# Patient Record
Sex: Female | Born: 1955 | Race: Black or African American | Hispanic: No | Smoking: Former smoker
Health system: Southern US, Community
[De-identification: ages and names within clinical notes are randomized; demographics above are authoritative.]

## PROBLEM LIST (undated history)

## (undated) DIAGNOSIS — H409 Unspecified glaucoma: Secondary | ICD-10-CM

## (undated) DIAGNOSIS — I10 Essential (primary) hypertension: Secondary | ICD-10-CM

## (undated) DIAGNOSIS — Z923 Personal history of irradiation: Secondary | ICD-10-CM

## (undated) DIAGNOSIS — C801 Malignant (primary) neoplasm, unspecified: Secondary | ICD-10-CM

## (undated) DIAGNOSIS — C50919 Malignant neoplasm of unspecified site of unspecified female breast: Secondary | ICD-10-CM

## (undated) DIAGNOSIS — Z860100 Personal history of colon polyps, unspecified: Secondary | ICD-10-CM

## (undated) DIAGNOSIS — Z803 Family history of malignant neoplasm of breast: Secondary | ICD-10-CM

## (undated) DIAGNOSIS — Z800 Family history of malignant neoplasm of digestive organs: Secondary | ICD-10-CM

## (undated) DIAGNOSIS — E785 Hyperlipidemia, unspecified: Secondary | ICD-10-CM

## (undated) DIAGNOSIS — D649 Anemia, unspecified: Secondary | ICD-10-CM

## (undated) DIAGNOSIS — H548 Legal blindness, as defined in USA: Secondary | ICD-10-CM

## (undated) HISTORY — PX: BREAST EXCISIONAL BIOPSY: SUR124

## (undated) HISTORY — DX: Family history of malignant neoplasm of breast: Z80.3

## (undated) HISTORY — PX: COLONOSCOPY: SHX174

## (undated) HISTORY — DX: Family history of malignant neoplasm of digestive organs: Z80.0

## (undated) HISTORY — PX: BREAST LUMPECTOMY: SHX2

## (undated) HISTORY — PX: HERNIA REPAIR: SHX51

## (undated) HISTORY — PX: TUBAL LIGATION: SHX77

---
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
## 1999-09-05 ENCOUNTER — Encounter: Admission: RE | Admit: 1999-09-05 | Discharge: 1999-09-13 | Payer: Self-pay | Admitting: Internal Medicine

## 1999-11-13 ENCOUNTER — Ambulatory Visit (HOSPITAL_COMMUNITY): Admission: RE | Admit: 1999-11-13 | Discharge: 1999-11-13 | Payer: Self-pay | Admitting: Internal Medicine

## 1999-11-13 ENCOUNTER — Encounter: Payer: Self-pay | Admitting: Internal Medicine

## 2000-02-27 ENCOUNTER — Emergency Department (HOSPITAL_COMMUNITY): Admission: EM | Admit: 2000-02-27 | Discharge: 2000-02-28 | Payer: Self-pay | Admitting: Emergency Medicine

## 2000-02-27 ENCOUNTER — Encounter: Payer: Self-pay | Admitting: Emergency Medicine

## 2004-07-20 ENCOUNTER — Ambulatory Visit: Payer: Self-pay | Admitting: Family Medicine

## 2004-08-01 ENCOUNTER — Ambulatory Visit: Payer: Self-pay | Admitting: Family Medicine

## 2004-08-03 ENCOUNTER — Ambulatory Visit: Payer: Self-pay | Admitting: Family Medicine

## 2004-08-24 ENCOUNTER — Encounter: Admission: RE | Admit: 2004-08-24 | Discharge: 2004-08-24 | Payer: Self-pay | Admitting: Sports Medicine

## 2005-01-12 ENCOUNTER — Ambulatory Visit: Payer: Self-pay | Admitting: Family Medicine

## 2005-01-26 ENCOUNTER — Ambulatory Visit: Payer: Self-pay | Admitting: Family Medicine

## 2005-04-06 ENCOUNTER — Ambulatory Visit: Payer: Self-pay | Admitting: Family Medicine

## 2005-08-28 ENCOUNTER — Encounter: Admission: RE | Admit: 2005-08-28 | Discharge: 2005-08-28 | Payer: Self-pay | Admitting: Sports Medicine

## 2005-09-19 ENCOUNTER — Ambulatory Visit: Payer: Self-pay | Admitting: Family Medicine

## 2006-09-13 ENCOUNTER — Encounter (INDEPENDENT_AMBULATORY_CARE_PROVIDER_SITE_OTHER): Payer: Self-pay | Admitting: *Deleted

## 2006-09-13 LAB — CONVERTED CEMR LAB

## 2006-09-20 ENCOUNTER — Encounter: Admission: RE | Admit: 2006-09-20 | Discharge: 2006-09-20 | Payer: Self-pay | Admitting: Sports Medicine

## 2006-10-02 ENCOUNTER — Encounter (INDEPENDENT_AMBULATORY_CARE_PROVIDER_SITE_OTHER): Payer: Self-pay | Admitting: Family Medicine

## 2006-10-02 ENCOUNTER — Ambulatory Visit: Payer: Self-pay | Admitting: Family Medicine

## 2006-10-02 LAB — CONVERTED CEMR LAB
AST: 16 units/L (ref 0–37)
Albumin: 4.2 g/dL (ref 3.5–5.2)
BUN: 8 mg/dL (ref 6–23)
Calcium: 9.6 mg/dL (ref 8.4–10.5)
Chloride: 105 meq/L (ref 96–112)
Glucose, Bld: 72 mg/dL (ref 70–99)
HDL: 50 mg/dL (ref >39)
LDL Cholesterol: 113 mg/dL — ABNORMAL HIGH (ref 0–99)
Potassium: 3.8 meq/L (ref 3.5–5.3)
Sodium: 142 meq/L (ref 135–145)
Total Protein: 6.8 g/dL (ref 6.0–8.3)

## 2006-10-03 LAB — CONVERTED CEMR LAB
HDL: 50 mg/dL
LDL Cholesterol: 113 mg/dL
Triglycerides: 71 mg/dL

## 2006-10-10 DIAGNOSIS — K649 Unspecified hemorrhoids: Secondary | ICD-10-CM | POA: Insufficient documentation

## 2006-10-10 DIAGNOSIS — I10 Essential (primary) hypertension: Secondary | ICD-10-CM | POA: Insufficient documentation

## 2006-10-11 ENCOUNTER — Encounter (INDEPENDENT_AMBULATORY_CARE_PROVIDER_SITE_OTHER): Payer: Self-pay | Admitting: *Deleted

## 2007-01-20 ENCOUNTER — Ambulatory Visit: Payer: Self-pay | Admitting: Sports Medicine

## 2007-03-03 ENCOUNTER — Encounter (INDEPENDENT_AMBULATORY_CARE_PROVIDER_SITE_OTHER): Payer: Self-pay | Admitting: Family Medicine

## 2007-03-03 ENCOUNTER — Ambulatory Visit: Payer: Self-pay | Admitting: Family Medicine

## 2007-03-04 ENCOUNTER — Encounter (INDEPENDENT_AMBULATORY_CARE_PROVIDER_SITE_OTHER): Payer: Self-pay | Admitting: Family Medicine

## 2007-03-04 LAB — CONVERTED CEMR LAB
BUN: 15 mg/dL (ref 6–23)
Calcium: 9.6 mg/dL (ref 8.4–10.5)
Chloride: 103 meq/L (ref 96–112)
Creatinine, Ser: 0.85 mg/dL (ref 0.40–1.20)

## 2007-03-07 ENCOUNTER — Ambulatory Visit: Payer: Self-pay | Admitting: Family Medicine

## 2007-03-07 LAB — CONVERTED CEMR LAB: Blood Glucose, AC Bkfst: 119 mg/dL

## 2007-04-11 ENCOUNTER — Encounter (INDEPENDENT_AMBULATORY_CARE_PROVIDER_SITE_OTHER): Payer: Self-pay | Admitting: Family Medicine

## 2007-04-17 ENCOUNTER — Ambulatory Visit: Payer: Self-pay | Admitting: Sports Medicine

## 2007-04-17 ENCOUNTER — Encounter (INDEPENDENT_AMBULATORY_CARE_PROVIDER_SITE_OTHER): Payer: Self-pay | Admitting: Family Medicine

## 2007-04-17 DIAGNOSIS — R011 Cardiac murmur, unspecified: Secondary | ICD-10-CM

## 2007-04-17 LAB — CONVERTED CEMR LAB
BUN: 17 mg/dL
CO2, serum: 29 mmol/L
Calcium: 10 mg/dL
Chloride, Serum: 102 mmol/L
Potassium, serum: 3.3 mmol/L
Sodium, serum: 142 mmol/L

## 2007-04-18 LAB — CONVERTED CEMR LAB
Calcium: 10 mg/dL (ref 8.4–10.5)
Glucose, Bld: 115 mg/dL — ABNORMAL HIGH (ref 70–99)
Potassium: 3.3 meq/L — ABNORMAL LOW (ref 3.5–5.3)
Sodium: 142 meq/L (ref 135–145)

## 2007-04-29 ENCOUNTER — Ambulatory Visit (HOSPITAL_COMMUNITY): Admission: RE | Admit: 2007-04-29 | Discharge: 2007-04-29 | Payer: Self-pay | Admitting: Sports Medicine

## 2007-04-29 ENCOUNTER — Encounter: Payer: Self-pay | Admitting: Sports Medicine

## 2007-05-07 ENCOUNTER — Telehealth: Payer: Self-pay | Admitting: *Deleted

## 2007-05-15 ENCOUNTER — Ambulatory Visit: Payer: Self-pay | Admitting: Family Medicine

## 2007-08-21 ENCOUNTER — Encounter (INDEPENDENT_AMBULATORY_CARE_PROVIDER_SITE_OTHER): Payer: Self-pay | Admitting: Family Medicine

## 2007-09-08 ENCOUNTER — Telehealth (INDEPENDENT_AMBULATORY_CARE_PROVIDER_SITE_OTHER): Payer: Self-pay | Admitting: Family Medicine

## 2007-09-19 ENCOUNTER — Ambulatory Visit: Payer: Self-pay | Admitting: Family Medicine

## 2007-09-30 ENCOUNTER — Telehealth (INDEPENDENT_AMBULATORY_CARE_PROVIDER_SITE_OTHER): Payer: Self-pay | Admitting: Family Medicine

## 2007-10-21 ENCOUNTER — Encounter: Admission: RE | Admit: 2007-10-21 | Discharge: 2007-10-21 | Payer: Self-pay | Admitting: Family Medicine

## 2007-10-22 ENCOUNTER — Encounter (INDEPENDENT_AMBULATORY_CARE_PROVIDER_SITE_OTHER): Payer: Self-pay | Admitting: Family Medicine

## 2007-10-22 ENCOUNTER — Ambulatory Visit: Payer: Self-pay | Admitting: Family Medicine

## 2007-10-22 LAB — CONVERTED CEMR LAB
ALT: 16 units/L (ref 0–35)
BUN: 10 mg/dL (ref 6–23)
CO2: 26 meq/L (ref 19–32)
Calcium: 9.2 mg/dL (ref 8.4–10.5)
Chloride: 105 meq/L (ref 96–112)
Cholesterol: 191 mg/dL (ref 0–200)
Creatinine, Ser: 0.63 mg/dL (ref 0.40–1.20)
Glucose, Bld: 160 mg/dL — ABNORMAL HIGH (ref 70–99)
Total Bilirubin: 0.5 mg/dL (ref 0.3–1.2)
Total CHOL/HDL Ratio: 3.4
Triglycerides: 80 mg/dL (ref <150)
VLDL: 16 mg/dL (ref 0–40)

## 2007-11-11 ENCOUNTER — Encounter (INDEPENDENT_AMBULATORY_CARE_PROVIDER_SITE_OTHER): Payer: Self-pay | Admitting: Family Medicine

## 2007-11-11 ENCOUNTER — Telehealth (INDEPENDENT_AMBULATORY_CARE_PROVIDER_SITE_OTHER): Payer: Self-pay | Admitting: *Deleted

## 2008-02-02 ENCOUNTER — Telehealth (INDEPENDENT_AMBULATORY_CARE_PROVIDER_SITE_OTHER): Payer: Self-pay | Admitting: *Deleted

## 2008-03-30 ENCOUNTER — Telehealth: Payer: Self-pay | Admitting: *Deleted

## 2008-05-10 ENCOUNTER — Encounter: Payer: Self-pay | Admitting: *Deleted

## 2008-05-10 ENCOUNTER — Telehealth: Payer: Self-pay | Admitting: *Deleted

## 2008-05-26 ENCOUNTER — Ambulatory Visit: Payer: Self-pay | Admitting: Family Medicine

## 2008-05-26 ENCOUNTER — Encounter (INDEPENDENT_AMBULATORY_CARE_PROVIDER_SITE_OTHER): Payer: Self-pay | Admitting: Family Medicine

## 2008-05-26 DIAGNOSIS — H4010X3 Unspecified open-angle glaucoma, severe stage: Secondary | ICD-10-CM

## 2008-05-27 LAB — CONVERTED CEMR LAB
BUN: 17 mg/dL (ref 6–23)
CO2: 26 meq/L (ref 19–32)
Calcium: 9.8 mg/dL (ref 8.4–10.5)
Glucose, Bld: 100 mg/dL — ABNORMAL HIGH (ref 70–99)
Potassium: 3.5 meq/L (ref 3.5–5.3)
Sodium: 143 meq/L (ref 135–145)

## 2008-06-23 ENCOUNTER — Ambulatory Visit: Payer: Self-pay | Admitting: Family Medicine

## 2008-06-23 DIAGNOSIS — E119 Type 2 diabetes mellitus without complications: Secondary | ICD-10-CM

## 2008-06-23 DIAGNOSIS — E1165 Type 2 diabetes mellitus with hyperglycemia: Secondary | ICD-10-CM | POA: Insufficient documentation

## 2008-06-30 ENCOUNTER — Ambulatory Visit: Payer: Self-pay | Admitting: Sports Medicine

## 2008-07-01 ENCOUNTER — Telehealth (INDEPENDENT_AMBULATORY_CARE_PROVIDER_SITE_OTHER): Payer: Self-pay | Admitting: *Deleted

## 2008-08-03 ENCOUNTER — Encounter (INDEPENDENT_AMBULATORY_CARE_PROVIDER_SITE_OTHER): Payer: Self-pay | Admitting: Family Medicine

## 2008-08-04 ENCOUNTER — Telehealth (INDEPENDENT_AMBULATORY_CARE_PROVIDER_SITE_OTHER): Payer: Self-pay | Admitting: Family Medicine

## 2008-08-09 ENCOUNTER — Ambulatory Visit: Payer: Self-pay | Admitting: Family Medicine

## 2008-08-09 ENCOUNTER — Encounter (INDEPENDENT_AMBULATORY_CARE_PROVIDER_SITE_OTHER): Payer: Self-pay | Admitting: Family Medicine

## 2008-08-09 LAB — CONVERTED CEMR LAB
BUN: 12 mg/dL (ref 6–23)
CO2: 28 meq/L (ref 19–32)
Glucose, Bld: 145 mg/dL — ABNORMAL HIGH (ref 70–99)
Potassium: 3.8 meq/L (ref 3.5–5.3)
Sodium: 141 meq/L (ref 135–145)

## 2008-08-18 ENCOUNTER — Telehealth (INDEPENDENT_AMBULATORY_CARE_PROVIDER_SITE_OTHER): Payer: Self-pay | Admitting: Family Medicine

## 2008-09-27 ENCOUNTER — Telehealth: Payer: Self-pay | Admitting: *Deleted

## 2008-09-29 ENCOUNTER — Ambulatory Visit: Payer: Self-pay | Admitting: Family Medicine

## 2008-09-29 ENCOUNTER — Encounter (INDEPENDENT_AMBULATORY_CARE_PROVIDER_SITE_OTHER): Payer: Self-pay | Admitting: Family Medicine

## 2008-09-30 ENCOUNTER — Ambulatory Visit: Payer: Self-pay | Admitting: Family Medicine

## 2008-10-04 ENCOUNTER — Encounter (INDEPENDENT_AMBULATORY_CARE_PROVIDER_SITE_OTHER): Payer: Self-pay | Admitting: Family Medicine

## 2008-10-21 ENCOUNTER — Encounter: Admission: RE | Admit: 2008-10-21 | Discharge: 2008-10-21 | Payer: Self-pay | Admitting: Family Medicine

## 2008-10-29 ENCOUNTER — Encounter: Payer: Self-pay | Admitting: *Deleted

## 2008-11-22 ENCOUNTER — Encounter (INDEPENDENT_AMBULATORY_CARE_PROVIDER_SITE_OTHER): Payer: Self-pay | Admitting: Family Medicine

## 2008-11-22 ENCOUNTER — Ambulatory Visit: Payer: Self-pay | Admitting: Family Medicine

## 2008-11-22 DIAGNOSIS — E669 Obesity, unspecified: Secondary | ICD-10-CM

## 2008-11-30 LAB — CONVERTED CEMR LAB
BUN: 13 mg/dL (ref 6–23)
CO2: 24 meq/L (ref 19–32)
Calcium: 9.5 mg/dL (ref 8.4–10.5)
Chloride: 102 meq/L (ref 96–112)
Cholesterol: 195 mg/dL (ref 0–200)
Creatinine, Ser: 0.76 mg/dL (ref 0.40–1.20)
HDL: 56 mg/dL (ref >39)
Total Bilirubin: 0.4 mg/dL (ref 0.3–1.2)
Total CHOL/HDL Ratio: 3.5
Triglycerides: 93 mg/dL (ref <150)

## 2008-12-07 ENCOUNTER — Telehealth (INDEPENDENT_AMBULATORY_CARE_PROVIDER_SITE_OTHER): Payer: Self-pay | Admitting: *Deleted

## 2008-12-21 ENCOUNTER — Encounter: Payer: Self-pay | Admitting: Family Medicine

## 2008-12-21 ENCOUNTER — Ambulatory Visit (HOSPITAL_COMMUNITY): Admission: RE | Admit: 2008-12-21 | Discharge: 2008-12-21 | Payer: Self-pay | Admitting: Gastroenterology

## 2008-12-21 ENCOUNTER — Encounter (INDEPENDENT_AMBULATORY_CARE_PROVIDER_SITE_OTHER): Payer: Self-pay | Admitting: Gastroenterology

## 2008-12-23 ENCOUNTER — Ambulatory Visit: Payer: Self-pay | Admitting: Family Medicine

## 2008-12-24 ENCOUNTER — Telehealth (INDEPENDENT_AMBULATORY_CARE_PROVIDER_SITE_OTHER): Payer: Self-pay | Admitting: *Deleted

## 2008-12-29 ENCOUNTER — Telehealth (INDEPENDENT_AMBULATORY_CARE_PROVIDER_SITE_OTHER): Payer: Self-pay | Admitting: Family Medicine

## 2008-12-30 ENCOUNTER — Encounter (INDEPENDENT_AMBULATORY_CARE_PROVIDER_SITE_OTHER): Payer: Self-pay | Admitting: Family Medicine

## 2008-12-30 ENCOUNTER — Ambulatory Visit: Payer: Self-pay | Admitting: Family Medicine

## 2009-01-04 ENCOUNTER — Telehealth (INDEPENDENT_AMBULATORY_CARE_PROVIDER_SITE_OTHER): Payer: Self-pay | Admitting: Family Medicine

## 2009-01-07 ENCOUNTER — Telehealth: Payer: Self-pay | Admitting: *Deleted

## 2009-01-26 ENCOUNTER — Ambulatory Visit: Payer: Self-pay | Admitting: Family Medicine

## 2009-01-26 ENCOUNTER — Encounter (INDEPENDENT_AMBULATORY_CARE_PROVIDER_SITE_OTHER): Payer: Self-pay | Admitting: Family Medicine

## 2009-01-26 DIAGNOSIS — E785 Hyperlipidemia, unspecified: Secondary | ICD-10-CM

## 2009-01-27 LAB — CONVERTED CEMR LAB
BUN: 17 mg/dL (ref 6–23)
CO2: 27 meq/L (ref 19–32)
Creatinine, Ser: 0.69 mg/dL (ref 0.40–1.20)
Direct LDL: 91 mg/dL
Glucose, Bld: 86 mg/dL (ref 70–99)
Total Bilirubin: 0.3 mg/dL (ref 0.3–1.2)
Total Protein: 6.5 g/dL (ref 6.0–8.3)

## 2009-04-26 ENCOUNTER — Telehealth: Payer: Self-pay | Admitting: Family Medicine

## 2009-05-02 ENCOUNTER — Ambulatory Visit: Payer: Self-pay | Admitting: Family Medicine

## 2009-05-02 LAB — CONVERTED CEMR LAB: Hgb A1c MFr Bld: 7 %

## 2009-09-07 ENCOUNTER — Ambulatory Visit: Payer: Self-pay | Admitting: Family Medicine

## 2009-09-14 ENCOUNTER — Telehealth: Payer: Self-pay | Admitting: Family Medicine

## 2009-09-27 ENCOUNTER — Ambulatory Visit: Payer: Self-pay | Admitting: Family Medicine

## 2009-09-27 ENCOUNTER — Encounter: Payer: Self-pay | Admitting: Family Medicine

## 2009-09-27 LAB — CONVERTED CEMR LAB
Hgb A1c MFr Bld: 7.3 %
Whiff Test: POSITIVE

## 2009-10-03 LAB — CONVERTED CEMR LAB
Albumin: 4.4 g/dL (ref 3.5–5.2)
Alkaline Phosphatase: 61 units/L (ref 39–117)
BUN: 14 mg/dL (ref 6–23)
Creatinine, Ser: 0.73 mg/dL (ref 0.40–1.20)
Glucose, Bld: 93 mg/dL (ref 70–99)
Potassium: 3.9 meq/L (ref 3.5–5.3)

## 2009-10-25 ENCOUNTER — Ambulatory Visit (HOSPITAL_COMMUNITY): Admission: RE | Admit: 2009-10-25 | Discharge: 2009-10-25 | Payer: Self-pay | Admitting: Family Medicine

## 2009-10-25 ENCOUNTER — Encounter: Payer: Self-pay | Admitting: Family Medicine

## 2009-10-25 ENCOUNTER — Ambulatory Visit: Payer: Self-pay | Admitting: Family Medicine

## 2009-10-25 DIAGNOSIS — R55 Syncope and collapse: Secondary | ICD-10-CM

## 2009-10-26 ENCOUNTER — Encounter: Admission: RE | Admit: 2009-10-26 | Discharge: 2009-10-26 | Payer: Self-pay | Admitting: Family Medicine

## 2009-10-26 LAB — CONVERTED CEMR LAB
Hemoglobin: 13.5 g/dL (ref 12.0–15.0)
Lymphocytes Relative: 47 % — ABNORMAL HIGH (ref 12–46)
MCHC: 32.3 g/dL (ref 30.0–36.0)
Monocytes Absolute: 0.5 10*3/uL (ref 0.1–1.0)
Monocytes Relative: 8 % (ref 3–12)
Neutro Abs: 2.4 10*3/uL (ref 1.7–7.7)
RBC: 4.91 M/uL (ref 3.87–5.11)
TSH: 0.867 microintl units/mL (ref 0.350–4.500)

## 2009-10-31 ENCOUNTER — Encounter: Payer: Self-pay | Admitting: Family Medicine

## 2009-12-23 ENCOUNTER — Telehealth: Payer: Self-pay | Admitting: Family Medicine

## 2010-01-11 ENCOUNTER — Ambulatory Visit: Payer: Self-pay | Admitting: Family Medicine

## 2010-01-11 LAB — CONVERTED CEMR LAB: Hgb A1c MFr Bld: 7.6 %

## 2010-03-28 ENCOUNTER — Encounter: Payer: Self-pay | Admitting: Family Medicine

## 2010-03-28 ENCOUNTER — Ambulatory Visit: Payer: Self-pay | Admitting: Family Medicine

## 2010-03-28 DIAGNOSIS — H109 Unspecified conjunctivitis: Secondary | ICD-10-CM | POA: Insufficient documentation

## 2010-04-11 ENCOUNTER — Telehealth: Payer: Self-pay | Admitting: Family Medicine

## 2010-05-08 ENCOUNTER — Telehealth: Payer: Self-pay | Admitting: Family Medicine

## 2010-05-17 ENCOUNTER — Ambulatory Visit: Payer: Self-pay | Admitting: Family Medicine

## 2010-06-06 ENCOUNTER — Telehealth: Payer: Self-pay | Admitting: Family Medicine

## 2010-06-07 ENCOUNTER — Telehealth (INDEPENDENT_AMBULATORY_CARE_PROVIDER_SITE_OTHER): Payer: Self-pay | Admitting: *Deleted

## 2010-06-07 ENCOUNTER — Encounter: Payer: Self-pay | Admitting: Family Medicine

## 2010-06-28 ENCOUNTER — Ambulatory Visit: Payer: Self-pay | Admitting: Family Medicine

## 2010-06-28 DIAGNOSIS — Z8669 Personal history of other diseases of the nervous system and sense organs: Secondary | ICD-10-CM | POA: Insufficient documentation

## 2010-07-12 ENCOUNTER — Encounter: Payer: Self-pay | Admitting: *Deleted

## 2010-09-12 NOTE — Assessment & Plan Note (Signed)
Summary: new syncopal episode   Vital Signs:  Patient profile:   55 year old female Height:      63 inches Weight:      169.1 pounds BMI:     30.06 Temp:     97.8 degrees F oral Pulse rate:   60 / minute BP sitting:   142 / 89  (left arm) Cuff size:   regular  Vitals Entered By: Gladstone Pih (October 25, 2009 10:31 AM)   Serial Vital Signs/Assessments:  Time      Position  BP       Pulse  Resp  Temp     By 10:35 AM            138/88                         Gladstone Pih  Comments: 10:35 AM re checked manually By: Gladstone Pih  11:04 AM ortho static laying down 128/82 pulse 68 sitting up 132/92  Pulse 64 standing up  136/94  pulse 60 By: Gladstone Pih   CC: F/U HTN, new med from last visit Is Patient Diabetic? Yes Did you bring your meter with you today? No Pain Assessment Patient in pain? no        Primary Care Provider:  Marisue Ivan  MD  CC:  F/U HTN and new med from last visit.  History of Present Illness: 55yo F here for f/u HTN and new syncopal event  HTN: No adverse effects from medication.  Not checking it regularly.  Was not well controlled at last visit.  No dizziness, HA, CP, palpitations, or swelling.  Syncopal event: 2 episodes in the past 2 weeks.  Occurred while she was placing her grandchild in a car seat.  States that she passed out for a few seconds.  No head trauma.  No hx of shaking or incontinence.  Denies any chest pain or palpitations associated.     Habits & Providers  Alcohol-Tobacco-Diet     Tobacco Status: never  Current Medications (verified): 1)  Hydrochlorothiazide 25 Mg Tabs (Hydrochlorothiazide) .... Take 1 Tablet By Mouth Once A Day 2)  Lisinopril 20 Mg Tabs (Lisinopril) .Marland Kitchen.. 1 Tablet By Mouth Daily For Bp 3)  Metoprolol Tartrate 50 Mg Tabs (Metoprolol Tartrate) .... Take 1 Tablet By Mouth Two Times A Day 4)  Tgt Aspirin 81 Mg Tbec (Aspirin) .... Take 1 Tablet By Mouth Once A Day 5)  Womens Multivitamin Plus  Tabs  (Multiple Vitamins-Minerals) 6)  Metformin Hcl 500 Mg Tabs (Metformin Hcl) .Marland Kitchen.. 1 Tablet By Mouth Two Times A Day With Food 7)  Test Strips, Lancets .... Use To Check Your Blood Sugar Every Morning Before Breakfast - Disp Qs For 90 Day Supply 8)  Simvastatin 40 Mg Tabs (Simvastatin) .Marland Kitchen.. 1 Tablet By Mouth Daily For Cholesterol 9)  Amlodipine Besylate 10 Mg Tabs (Amlodipine Besylate) .Marland Kitchen.. 1 Tab By Mouth Daily  Allergies (verified): 1)  Sulfamethoxazole (Sulfamethoxazole)  Review of Systems       Denies any chest pain or palpitations associated.   No dizziness, HA, CP, palpitations, or swelling.   Physical Exam  General:  VS Reviewed. Well appearing, NAD.  Neck:  supple, full ROM, no goiter or mass  Lungs:  Normal respiratory effort, chest expands symmetrically. Lungs are clear to auscultation, no crackles or wheezes. Heart:  Normal rate and regular rhythm. S1 and S2 normal without gallop, murmur, click, rub or other extra sounds.  Extremities:  no edema Neurologic:  no focal deficits Additional Exam:  12 lead EKG: NSR, rate 51, no ST or T wave changes   Impression & Recommendations:  Problem # 1:  SYNCOPE (ICD-780.2) Assessment New New onset- isolated episodes. EKG conveys bradycardia without arrhythmias.   Orthostatics negative. TSH and CBC- pending Suspect the bradycardia may be a likely cause.  Will decrease the metoprolol to 1 tab two times a day. Reassess in 1 month.    Orders: 12 Lead EKG (12 Lead EKG) CBC w/Diff-[facility] ([address]109) TSH-[facility] (60454-09811) [facility]- Est  Level 4 (91478)  Problem # 2:  HYPERTENSION, BENIGN SYSTEMIC (ICD-401.1) Assessment: Improved At goal (<140/90). However, given the syncopal episodes that could possibly be due to the bradycardia, plan to dec the metoprolol to 1 tab two times a day and inc the amlodipine to 10mg .    Her updated medication list for this problem includes:    Hydrochlorothiazide 25 Mg Tabs (Hydrochlorothiazide) .Marland Kitchen... Take 1  tablet by mouth once a day    Lisinopril 20 Mg Tabs (Lisinopril) .Marland Kitchen... 1 tablet by mouth daily for bp    Metoprolol Tartrate 50 Mg Tabs (Metoprolol tartrate) .Marland Kitchen... Take 1 tablet by mouth two times a day    Amlodipine Besylate 10 Mg Tabs (Amlodipine besylate) .Marland Kitchen... 1 tab by mouth daily  Orders: [facility]- Est  Level 4 (29562)  Complete Medication List: 1)  Hydrochlorothiazide 25 Mg Tabs (Hydrochlorothiazide) .... Take 1 tablet by mouth once a day 2)  Lisinopril 20 Mg Tabs (Lisinopril) .Marland Kitchen.. 1 tablet by mouth daily for bp 3)  Metoprolol Tartrate 50 Mg Tabs (Metoprolol tartrate) .... Take 1 tablet by mouth two times a day 4)  Tgt Aspirin 81 Mg Tbec (Aspirin) .... Take 1 tablet by mouth once a day 5)  Womens Multivitamin Plus Tabs (Multiple vitamins-minerals) 6)  Metformin Hcl 500 Mg Tabs (Metformin hcl) .Marland Kitchen.. 1 tablet by mouth two times a day with food 7)  Test Strips, Lancets  .... Use to check your blood sugar every morning before breakfast - disp qs for 90 day supply 8)  Simvastatin 40 Mg Tabs (Simvastatin) .Marland Kitchen.. 1 tablet by mouth daily for cholesterol 9)  Amlodipine Besylate 10 Mg Tabs (Amlodipine besylate) .Marland Kitchen.. 1 tab by mouth daily  Patient Instructions: 1)  Check your pulse and bp this week and let me know the readings. 2)  We decreased the metoprolol to 1 tablet two times a day  3)  We increased the amlodipine to 10mg  daily Prescriptions: AMLODIPINE BESYLATE 10 MG TABS (AMLODIPINE BESYLATE) 1 tab by mouth daily  #90 x 1   Entered and Authorized by:   Marisue Ivan  MD   Signed by:   Marisue Ivan  MD on 10/25/2009   Method used:   Faxed to ...       [facility] Department (retail)       [address] [place], [place]  [address]       Ph: [phone]       Fax: [phone]   RxID:   [id number]      Prevention & Chronic Care Immunizations   Influenza vaccine: Fluvax 3+  (09/07/2009)   Influenza vaccine deferral: Not available  (05/02/2009)    Influenza vaccine due: 05/26/2009    Tetanus booster: 01/16/2005: Done.   Tetanus booster due: 01/17/2015    Pneumococcal vaccine: Not documented  Colorectal Screening   Hemoccult: Not documented   Hemoccult due: Not Indicated  Colonoscopy: abnormal  (12/21/2008)   Colonoscopy due: 12/22/2011  Other Screening   Pap smear: NEGATIVE FOR INTRAEPITHELIAL LESIONS OR MALIGNANCY.  (09/29/2008)   Pap smear due: 09/30/2011    Mammogram: ASSESSMENT: Negative - BI-RADS 1^MM DIGITAL SCREENING  (10/21/2008)   Mammogram action/deferral: Ordered  (09/27/2009)   Mammogram due: 10/21/2009   Smoking status: never  (10/25/2009)  Diabetes Mellitus   HgbA1C: 7.3  (09/27/2009)   HgbA1C action/deferral: Ordered  (05/02/2009)   Hemoglobin A1C due: 12/27/2008    Eye exam: normal  (10/08/2008)   Eye exam due: 10/08/2009    Foot exam: yes  (01/26/2009)   High risk foot: Not documented   Foot care education: Not documented   Foot exam due: 06/23/2009    Urine microalbumin/creatinine ratio: Not documented   Urine microalbumin action/deferral: Not indicated   Urine microalbumin/cr due: 08/09/2009  Lipids   Total Cholesterol: 195  (11/22/2008)   LDL: 120  (11/22/2008)   LDL Direct: 91  (01/26/2009)   HDL: 56  (11/22/2008)   Triglycerides: 93  (11/22/2008)    SGOT (AST): 15  (09/27/2009)   BMP action: Ordered   SGPT (ALT): 15  (09/27/2009)   Alkaline phosphatase: 61  (09/27/2009)   Total bilirubin: 0.4  (09/27/2009)  Hypertension   Last Blood Pressure: 142 / 89  (10/25/2009)   Serum creatinine: 0.73  (09/27/2009)   BMP action: Ordered   Serum potassium 3.9  (09/27/2009)    Hypertension flowsheet reviewed?: Yes   Progress toward BP goal: At goal  Self-Management Support :   Personal Goals (by the next clinic visit) :     Personal A1C goal: 8  (09/27/2009)     Personal blood pressure goal: 140/90  (05/02/2009)     Personal LDL goal: 100  (05/02/2009)    Diabetes self-management  support: CBG self-monitoring log, Written self-care plan, Education handout  (09/27/2009)    Hypertension self-management support: Written self-care plan  (09/27/2009)    Lipid self-management support: Written self-care plan  (09/27/2009)

## 2010-09-12 NOTE — Assessment & Plan Note (Signed)
Summary: f/u chronic issues and recent syncopal events   Vital Signs:  Patient profile:   55 year old female Height:      63 inches Weight:      167.0 pounds BMI:     29.69 Temp:     97.5 degrees F oral Pulse rate:   64 / minute BP sitting:   128 / 80  (left arm) Cuff size:   regular  Vitals Entered By: Gladstone Pih (January 11, 2010 2:39 PM) CC: F/U for syncopal events Is Patient Diabetic? Yes Did you bring your meter with you today? No Pain Assessment Patient in pain? no        Primary Care Provider:  Marisue Ivan  MD  CC:  F/U for syncopal events.  History of Present Illness: 55yo F here to f/u for syncopal events  Syncopal events: No further episodes since decreasing the metoprolol.  HTN: No adverse effects from medication.  Not checking it regularly.  Was mildly elevated at last visit.  No dizziness, HA, CP, palpitations, or swelling.  DM:  Currently on Metformin.  No adverse effects.  No hypoglycemic events.  No paresthesia or peripheral nerve pain.  CBGs checked 1 time a day and typically run b/w 120-170s with occasional readings up to 200.  Due for diabetic eye exam.  HLD: Tolerating medication.  No adverse effects.  No muscle pain or abd pain.    Habits & Providers  Alcohol-Tobacco-Diet     Tobacco Status: quit     Tobacco Counseling: to quit use of tobacco products     Year Quit: 2000  Current Medications (verified): 1)  Lisinopril-Hydrochlorothiazide 20-25 Mg Tabs (Lisinopril-Hydrochlorothiazide) .Marland Kitchen.. 1 Tab By Mouth Daily 2)  Metoprolol Tartrate 50 Mg Tabs (Metoprolol Tartrate) .... Take 1 Tablet By Mouth Two Times A Day 3)  Tgt Aspirin 81 Mg Tbec (Aspirin) .... Take 1 Tablet By Mouth Once A Day 4)  Womens Multivitamin Plus  Tabs (Multiple Vitamins-Minerals) 5)  Metformin Hcl 1000 Mg Tabs (Metformin Hcl) .... 1/2 Tab in The Morning and 1 Tab in The Evening 6)  Test Strips, Lancets .... Use To Check Your Blood Sugar Every Morning Before Breakfast -  Disp Qs For 90 Day Supply 7)  Simvastatin 40 Mg Tabs (Simvastatin) .Marland Kitchen.. 1 Tablet By Mouth Daily For Cholesterol 8)  Amlodipine Besylate 10 Mg Tabs (Amlodipine Besylate) .Marland Kitchen.. 1 Tab By Mouth Daily  Allergies (verified): 1)  Sulfamethoxazole (Sulfamethoxazole)  Social History: Smoking Status:  quit  Review of Systems      See HPI  Physical Exam  General:  VS Reviewed. Well appearing, NAD.  Lungs:  Normal respiratory effort, chest expands symmetrically. Lungs are clear to auscultation, no crackles or wheezes. Heart:  Normal rate and regular rhythm. S1 and S2 normal without gallop, murmur, click, rub or other extra sounds. Abdomen:  Soft, NT, ND, no HSM, active BS  Extremities:  no edema Neurologic:  no focal deficits   Impression & Recommendations:  Problem # 1:  SYNCOPE (ICD-780.2) Assessment Improved  No further symptoms. Continue on current regimen of metoprolol.  Orders: [facility]- Est  Level 4 (81191)  Problem # 2:  HYPERTENSION, BENIGN SYSTEMIC (ICD-401.1) Assessment: Unchanged  At goal (<140/90). No changes to regimen.   The following medications were removed from the medication list:    Lisinopril 20 Mg Tabs (Lisinopril) .Marland Kitchen... 1 tablet by mouth daily for bp Her updated medication list for this problem includes:    Lisinopril-hydrochlorothiazide 20-25 Mg Tabs (Lisinopril-hydrochlorothiazide) .Marland KitchenMarland KitchenMarland KitchenMarland Kitchen  1 tab by mouth daily    Metoprolol Tartrate 50 Mg Tabs (Metoprolol tartrate) .Marland Kitchen... Take 1 tablet by mouth two times a day    Amlodipine Besylate 10 Mg Tabs (Amlodipine besylate) .Marland Kitchen... 1 tab by mouth daily  Orders: [facility]- Est  Level 4 (16109)  Problem # 3:  DIABETES MELLITUS, TYPE II (ICD-250.00) Assessment: Deteriorated A1c inc to 7.6%. will inc the metformin to 500mg  in the morning and 1000mg  at night. She is to f/u in 1 month to reassess tolerance of medication.  The following medications were removed from the medication list:    Lisinopril 20 Mg Tabs (Lisinopril)  .Marland Kitchen... 1 tablet by mouth daily for bp Her updated medication list for this problem includes:    Lisinopril-hydrochlorothiazide 20-25 Mg Tabs (Lisinopril-hydrochlorothiazide) .Marland Kitchen... 1 tab by mouth daily    Tgt Aspirin 81 Mg Tbec (Aspirin) .Marland Kitchen... Take 1 tablet by mouth once a day    Metformin Hcl 1000 Mg Tabs (Metformin hcl) .Marland Kitchen... 1/2 tab in the morning and 1 tab in the evening  Orders: A1C-[facility] (60454) [facility]- Est  Level 4 (09811)  Problem # 4:  HYPERLIPIDEMIA (ICD-272.4) Assessment: Improved  At goal (<100) No changes to regimen.  Her updated medication list for this problem includes:    Simvastatin 40 Mg Tabs (Simvastatin) .Marland Kitchen... 1 tablet by mouth daily for cholesterol  Orders: [facility]- Est  Level 4 (99214)  Complete Medication List: 1)  Lisinopril-hydrochlorothiazide 20-25 Mg Tabs (Lisinopril-hydrochlorothiazide) .Marland Kitchen.. 1 tab by mouth daily 2)  Metoprolol Tartrate 50 Mg Tabs (Metoprolol tartrate) .... Take 1 tablet by mouth two times a day 3)  Tgt Aspirin 81 Mg Tbec (Aspirin) .... Take 1 tablet by mouth once a day 4)  Womens Multivitamin Plus Tabs (Multiple vitamins-minerals) 5)  Metformin Hcl 1000 Mg Tabs (Metformin hcl) .... 1/2 tab in the morning and 1 tab in the evening 6)  Test Strips, Lancets  .... Use to check your blood sugar every morning before breakfast - disp qs for 90 day supply 7)  Simvastatin 40 Mg Tabs (Simvastatin) .Marland Kitchen.. 1 tablet by mouth daily for cholesterol 8)  Amlodipine Besylate 10 Mg Tabs (Amlodipine besylate) .Marland Kitchen.. 1 tab by mouth daily  Patient Instructions: 1)  Please schedule a follow-up appointment in 1 month to reassess how you are doing with the increase in metformin to 1000mg  in the morning and 500mg  at night. 2)  I sent some of your meds to [facility]. Prescriptions: METFORMIN HCL 1000 MG TABS (METFORMIN HCL) 1/2 tab in the morning and 1 tab in the evening  #180 x 1   Entered and Authorized by:   Marisue Ivan  MD   Signed by:   Marisue Ivan  MD on  01/11/2010   Method used:   Electronically to        [facility] [phone]* (retail)       [address]       [place], [place]  [address]       Ph: [phone]       Fax: [phone]   RxID:   [id number] SIMVASTATIN 40 MG TABS (SIMVASTATIN) 1 tablet by mouth daily for cholesterol  #30 x 5   Entered and Authorized by:   Marisue Ivan  MD   Signed by:   Marisue Ivan  MD on 01/11/2010   Method used:   Faxed to ...       [facility] Department (retail)       [address] [place]  [place], [place]  [address]       Ph: [phone]       Fax: [phone]   RxID:   [id number] METOPROLOL TARTRATE 50 MG TABS (METOPROLOL TARTRATE) Take 1 tablet by mouth two times a day  #180 x 1   Entered and Authorized by:   Marisue Ivan  MD   Signed by:   Marisue Ivan  MD on 01/11/2010   Method used:   Electronically to        [facility] [phone]* (retail)       [address]       [place], [place]  [address]       Ph: [phone]       Fax: [phone]   RxID:   [id number] LISINOPRIL-HYDROCHLOROTHIAZIDE 20-25 MG TABS (LISINOPRIL-HYDROCHLOROTHIAZIDE) 1 tab by mouth daily  #90 x 1   Entered and Authorized by:   Marisue Ivan  MD   Signed by:   Marisue Ivan  MD on 01/11/2010   Method used:   Electronically to        [facility] [phone]* (retail)       [address]       [place], [place]  [address]       Ph: [phone]       Fax: [phone]   RxID:   [id number]    Laboratory Results   Blood Tests   Date/Time Received: January 11, 2010 2:44 PM  Date/Time Reported: January 11, 2010 3:03 PM   HGBA1C: 7.6%   (Normal Range: Non-Diabetic - 3-6%   Control Diabetic - 6-8%)  Comments: ...........test performed by...........Marland KitchenTerese Door, CMA      Prevention & Chronic Care Immunizations   Influenza vaccine: Fluvax 3+  (09/07/2009)   Influenza vaccine deferral: Not available  (05/02/2009)   Influenza vaccine due:  05/26/2009    Tetanus booster: 01/16/2005: Done.   Tetanus booster due: 01/17/2015    Pneumococcal vaccine: Not documented  Colorectal Screening   Hemoccult: Not documented   Hemoccult due: Not Indicated    Colonoscopy: abnormal  (12/21/2008)   Colonoscopy due: 12/22/2011  Other Screening   Pap smear: NEGATIVE FOR INTRAEPITHELIAL LESIONS OR MALIGNANCY.  (09/29/2008)   Pap smear due: 09/30/2011    Mammogram: ASSESSMENT: Negative - BI-RADS 1^MM DIGITAL SCREENING  (10/26/2009)   Mammogram action/deferral: Ordered  (09/27/2009)   Mammogram due: 10/21/2009   Smoking status: quit  (01/11/2010)  Diabetes Mellitus   HgbA1C: 7.6  (01/11/2010)   HgbA1C action/deferral: Ordered  (05/02/2009)   Hemoglobin A1C due: 12/27/2008    Eye exam: normal  (10/08/2008)   Eye exam due: 10/08/2009    Foot exam: yes  (01/26/2009)   Foot exam action/deferral: Do today   High risk foot: Not documented   Foot care education: Not documented   Foot exam due: 06/23/2009    Urine microalbumin/creatinine ratio: Not documented   Urine microalbumin action/deferral: Not indicated   Urine microalbumin/cr due: 08/09/2009    Diabetes flowsheet reviewed?: Yes   Progress toward A1C goal: Deteriorated  Lipids   Total Cholesterol: 195  (11/22/2008)   LDL: 120  (11/22/2008)   LDL Direct: 91  (01/26/2009)   HDL: 56  (11/22/2008)   Triglycerides: 93  (11/22/2008)    SGOT (AST): 15  (09/27/2009)   BMP action: Ordered   SGPT (ALT): 15  (09/27/2009)   Alkaline phosphatase: 61  (09/27/2009)   Total bilirubin: 0.4  (09/27/2009)    Lipid flowsheet reviewed?: Yes  Progress toward LDL goal: At goal  Hypertension   Last Blood Pressure: 128 / 80  (01/11/2010)   Serum creatinine: 0.73  (09/27/2009)   BMP action: Ordered   Serum potassium 3.9  (09/27/2009)    Hypertension flowsheet reviewed?: Yes   Progress toward BP goal: At goal  Self-Management Support :   Personal Goals (by the next clinic visit)  :     Personal A1C goal: 8  (09/27/2009)     Personal blood pressure goal: 140/90  (05/02/2009)     Personal LDL goal: 100  (05/02/2009)    Patient will work on the following items until the next clinic visit to reach self-care goals:     Medications and monitoring: take my medicines every day, check my blood sugar, check my blood pressure, bring all of my medications to every visit  (01/11/2010)     Eating: drink diet soda or water instead of juice or soda, eat more vegetables, use fresh or frozen vegetables, eat foods that are low in salt, eat baked foods instead of fried foods, eat fruit for snacks and desserts, limit or avoid alcohol  (01/11/2010)     Activity: take a 30 minute walk every day  (05/02/2009)    Diabetes self-management support: Written self-care plan, Education handout  (01/11/2010)   Diabetes care plan printed   Diabetes education handout printed    Hypertension self-management support: Written self-care plan, Education handout  (01/11/2010)   Hypertension self-care plan printed.   Hypertension education handout printed    Lipid self-management support: Written self-care plan, Education handout  (01/11/2010)   Lipid self-care plan printed.   Lipid education handout printed   Nursing Instructions: Diabetic foot exam today     Diabetic Foot Exam Foot Inspection Is there a history of a foot ulcer?              No Is there a foot ulcer now?              No Can the patient see the bottom of their feet?          Yes Are the shoes appropriate in style and fit?          Yes Is there swelling or an abnormal foot shape?          No Are the toenails long?                No Are the toenails thick?                No Are the toenails ingrown?              No Is there heavy callous build-up?              No Is there pain in the calf muscle (Intermittent claudication) when walking?    NoIs there a claw toe deformity?              No Is there elevated skin temperature?             No Is there limited ankle dorsiflexion?            No Is there foot or ankle muscle weakness?            No  Diabetic Foot Care Education    10-g (5.07) Semmes-Weinstein Monofilament Test           Right Foot          Left Foot  Visual Inspection     normal           normal Test Control      normal         normal Site 1         normal         normal Site 2         normal         normal Site 3         normal         normal Site 4         normal         normal Site 5         normal         normal Site 6         normal         normal

## 2010-09-12 NOTE — Progress Notes (Signed)
Summary: refill  Phone Note Refill Request Call back at [phone] Message from:  Patient  Refills Requested: Medication #1:  METOPROLOL TARTRATE 50 MG TABS Take 1 tablet by mouth in the morning and 2 tablets at night Initial call taken by: De Nurse,  September 14, 2009 2:07 PM  Follow-up for Phone Call        to pcp Follow-up by: Golden Circle RN,  September 14, 2009 2:11 PM  Additional Follow-up for Phone Call Additional follow up Details #1::        Refill provided on 1/26 to [facility] pharm. Additional Follow-up by: Marisue Ivan  MD,  September 14, 2009 5:50 PM

## 2010-09-12 NOTE — Assessment & Plan Note (Signed)
Summary: HTN - uncontrolled; reschedule wellness visit   Vital Signs:  Patient profile:   55 year old female Height:      63 inches Weight:      171.2 pounds BMI:     30.44 Temp:     98.4 degrees F oral Pulse rate:   65 / minute BP sitting:   161 / 97  (right arm) Cuff size:   regular  Vitals Entered By: Gladstone Pih (September 07, 2009 10:55 AM)  Serial Vital Signs/Assessments:  Time      Position  BP       Pulse  Resp  Temp     By 10:59 AM            190/104                        Gladstone Pih                     166/98                         Marisue Ivan  MD  Comments: 10:59 AM re checked manually left arm  By: Gladstone Pih  L arm, sitting, manual By: Marisue Ivan  MD   CC: none Is Patient Diabetic? Yes Did you bring your meter with you today? No Pain Assessment Patient in pain? no        Primary Care Provider:  Marisue Ivan  MD  CC:  none.  History of Present Illness: 55yo F that initially presented for a wellness visit but was 1 hour late and found to have acutely elevated BP.  HTN: No adverse effects from medication.  Not checking it regularly.  Was not well controlled at last visit. Failed to follow up accordingly.  States that she is taking her medications as prescribed but I find that hard to believe given the time frame since her last refill.   No dizziness, HA, CP, palpitations, or swelling.   Current Medications (verified): 1)  Hydrochlorothiazide 25 Mg Tabs (Hydrochlorothiazide) .... Take 1 Tablet By Mouth Once A Day 2)  Lisinopril 20 Mg Tabs (Lisinopril) .Marland Kitchen.. 1 Tablet By Mouth Daily For Bp 3)  Metoprolol Tartrate 50 Mg Tabs (Metoprolol Tartrate) .... Take 1 Tablet By Mouth in The Morning and 2 Tablets At Night 4)  Tgt Aspirin 81 Mg Tbec (Aspirin) .... Take 1 Tablet By Mouth Once A Day 5)  Womens Multivitamin Plus  Tabs (Multiple Vitamins-Minerals) 6)  Metformin Hcl 500 Mg Tabs (Metformin Hcl) .Marland Kitchen.. 1 Tablet By Mouth Two Times A Day  With Food 7)  Test Strips, Lancets .... Use To Check Your Blood Sugar Every Morning Before Breakfast - Disp Qs For 90 Day Supply 8)  Simvastatin 40 Mg Tabs (Simvastatin) .Marland Kitchen.. 1 Tablet By Mouth Daily For Cholesterol  Allergies (verified): 1)  Sulfamethoxazole (Sulfamethoxazole)  Review of Systems       No dizziness, HA, CP, palpitations, or swelling.  Physical Exam  General:  VS Reviewed. Well appearing, NAD.  Head:  normocephalic.   Eyes:  EOMI PERRLA Mouth:  Oral mucosa and oropharynx without lesions or exudates.  Teeth in good repair. Neck:  supple, full ROM, no goiter or mass  Lungs:  Normal respiratory effort, chest expands symmetrically. Lungs are clear to auscultation, no crackles or wheezes. Heart:  Normal rate and regular rhythm. S1 and S2 normal without gallop, murmur, click, rub or  other extra sounds. Abdomen:  Soft, NT, ND, no HSM, active BS  Extremities:  no edema Neurologic:  no focal deficits Skin:  nl color and turgor   Impression & Recommendations:  Problem # 1:  HYPERTENSION, BENIGN SYSTEMIC (ICD-401.1) Assessment Deteriorated Not at goal.  Suspect that she is not being compliant with meds.  Plan to refill all meds but inc the metoprolol to 1 tab in the morning and 2 tabs in the evening.  She is to record her BP daily for the next 2 weeks and then contact me to let me know how it is reading.  She is asymptomatic and we had a long discussion regarding how important it is for her to have controlled bp.    Her updated medication list for this problem includes:    Hydrochlorothiazide 25 Mg Tabs (Hydrochlorothiazide) .Marland Kitchen... Take 1 tablet by mouth once a day    Lisinopril 20 Mg Tabs (Lisinopril) .Marland Kitchen... 1 tablet by mouth daily for bp    Metoprolol Tartrate 50 Mg Tabs (Metoprolol tartrate) .Marland Kitchen... Take 1 tablet by mouth in the morning and 2 tablets at night  Orders: [facility]- Est Level  3 (60454)  Problem # 2:  Preventive Health Care (ICD-V70.0) Assessment: Comment  Only Because the patient was over an hour late for her wellness visit, we will have to reschedule her appt.  Complete Medication List: 1)  Hydrochlorothiazide 25 Mg Tabs (Hydrochlorothiazide) .... Take 1 tablet by mouth once a day 2)  Lisinopril 20 Mg Tabs (Lisinopril) .Marland Kitchen.. 1 tablet by mouth daily for bp 3)  Metoprolol Tartrate 50 Mg Tabs (Metoprolol tartrate) .... Take 1 tablet by mouth in the morning and 2 tablets at night 4)  Tgt Aspirin 81 Mg Tbec (Aspirin) .... Take 1 tablet by mouth once a day 5)  Womens Multivitamin Plus Tabs (Multiple vitamins-minerals) 6)  Metformin Hcl 500 Mg Tabs (Metformin hcl) .Marland Kitchen.. 1 tablet by mouth two times a day with food 7)  Test Strips, Lancets  .... Use to check your blood sugar every morning before breakfast - disp qs for 90 day supply 8)  Simvastatin 40 Mg Tabs (Simvastatin) .Marland Kitchen.. 1 tablet by mouth daily for cholesterol  Other Orders: Flu Vaccine 39yrs + (09811) Admin 1st Vaccine (91478)  Patient Instructions: 1)  Call me in 2 weeks to discuss your blood pressure. 2)  Keep a log of your blood pressure everyday for the next 2 weeks. Prescriptions: SIMVASTATIN 40 MG TABS (SIMVASTATIN) 1 tablet by mouth daily for cholesterol  #90 x 0   Entered and Authorized by:   Marisue Ivan  MD   Signed by:   Marisue Ivan  MD on 09/07/2009   Method used:   Faxed to ...       [facility] Department (retail)       [address] [place], [place]  [address]       Ph: [phone]       Fax: [phone]   RxID:   [id number] METFORMIN HCL 500 MG TABS (METFORMIN HCL) 1 tablet by mouth two times a day with food  #180 x 0   Entered and Authorized by:   Marisue Ivan  MD   Signed by:   Marisue Ivan  MD on 09/07/2009   Method used:   Faxed to ...       [facility] Department (retail)       [address] [place]  [place], [place]  [address]       Ph: [phone]       Fax: [phone]   RxID:    [id number] METOPROLOL TARTRATE 50 MG TABS (METOPROLOL TARTRATE) Take 1 tablet by mouth in the morning and 2 tablets at night  #270 x 0   Entered and Authorized by:   Marisue Ivan  MD   Signed by:   Marisue Ivan  MD on 09/07/2009   Method used:   Faxed to ...       [facility] Department (retail)       [address] [place], [place]  [address]       Ph: [phone]       Fax: [phone]   RxID:   [id number] LISINOPRIL 20 MG TABS (LISINOPRIL) 1 tablet by mouth daily for BP  #90 x 0   Entered and Authorized by:   Marisue Ivan  MD   Signed by:   Marisue Ivan  MD on 09/07/2009   Method used:   Faxed to ...       [facility] Department (retail)       [address] [place], [place]  [address]       Ph: [phone]       Fax: [phone]   RxID:   [id number] HYDROCHLOROTHIAZIDE 25 MG TABS (HYDROCHLOROTHIAZIDE) Take 1 tablet by mouth once a day  #90 x 0   Entered and Authorized by:   Marisue Ivan  MD   Signed by:   Marisue Ivan  MD on 09/07/2009   Method used:   Faxed to ...       [facility] Department (retail)       [address] [place], [place]  [address]       Ph: [phone]       Fax: [phone]   RxID:   [id number]     Prevention & Chronic Care Immunizations   Influenza vaccine: Fluvax 3+  (09/07/2009)   Influenza vaccine deferral: Not available  (05/02/2009)   Influenza vaccine due: 05/26/2009    Tetanus booster: 01/16/2005: Done.   Tetanus booster due: 01/17/2015    Pneumococcal vaccine: Not documented  Colorectal Screening   Hemoccult: Not documented   Hemoccult due: Not Indicated    Colonoscopy: abnormal  (12/21/2008)   Colonoscopy due: 12/22/2011  Other Screening   Pap smear: NEGATIVE FOR INTRAEPITHELIAL LESIONS OR MALIGNANCY.  (09/29/2008)   Pap smear due: 09/30/2011    Mammogram: ASSESSMENT: Negative - BI-RADS 1^MM DIGITAL SCREENING   (10/21/2008)   Mammogram due: 10/21/2009   Smoking status: quit > 6 months  (05/02/2009)  Diabetes Mellitus   HgbA1C: 7.0  (05/02/2009)   HgbA1C action/deferral: Ordered  (05/02/2009)   Hemoglobin A1C due: 12/27/2008    Eye exam: normal  (10/08/2008)   Eye exam due: 10/08/2009    Foot exam: yes  (01/26/2009)   High risk foot: Not documented   Foot care education: Not documented   Foot exam due: 06/23/2009    Urine microalbumin/creatinine ratio: Not documented   Urine microalbumin action/deferral: Not indicated   Urine microalbumin/cr due: 08/09/2009  Lipids   Total Cholesterol: 195  (11/22/2008)   LDL: 120  (11/22/2008)   LDL Direct: 91  (01/26/2009)   HDL: 56  (11/22/2008)   Triglycerides: 93  (11/22/2008)    SGOT (AST): 12  (01/26/2009)   SGPT (  ALT): 11  (01/26/2009)   Alkaline phosphatase: 67  (01/26/2009)   Total bilirubin: 0.3  (01/26/2009)  Hypertension   Last Blood Pressure: 161 / 97  (09/07/2009)   Serum creatinine: 0.69  (01/26/2009)   Serum potassium 3.5  (01/26/2009)    Hypertension flowsheet reviewed?: Yes   Progress toward BP goal: Unchanged  Self-Management Support :   Personal Goals (by the next clinic visit) :     Personal A1C goal: 7  (05/02/2009)     Personal blood pressure goal: 140/90  (05/02/2009)     Personal LDL goal: 100  (05/02/2009)    Patient will work on the following items until the next clinic visit to reach self-care goals:     Medications and monitoring: take my medicines every day, check my blood pressure, bring all of my medications to every visit  (09/07/2009)     Eating: drink diet soda or water instead of juice or soda, eat more vegetables, use fresh or frozen vegetables, eat foods that are low in salt, eat baked foods instead of fried foods, eat fruit for snacks and desserts, limit or avoid alcohol  (09/07/2009)     Activity: take a 30 minute walk every day  (05/02/2009)    Diabetes self-management support: Written self-care  plan  (05/02/2009)    Hypertension self-management support: BP self-monitoring log, Written self-care plan, Education handout  (09/07/2009)   Hypertension self-care plan printed.   Hypertension education handout printed    Lipid self-management support: Written self-care plan  (05/02/2009)     Immunizations Administered:  Influenza Vaccine # 1:    Vaccine Type: Fluvax 3+    Site: right deltoid    Mfr: GlaxoSmithKline    Dose: 0.5 ml    Route: IM    Given by: Gladstone Pih    Exp. Date: 02/09/2010    Lot #: [id number]    VIS given: 10/10    Physician counseled: yes  Flu Vaccine Consent Questions:    Do you have a history of severe allergic reactions to this vaccine? no    Any prior history of allergic reactions to egg and/or gelatin? no    Do you have a sensitivity to the preservative Thimersol? no    Do you have a past history of Guillan-Barre Syndrome? no    Do you currently have an acute febrile illness? no    Have you ever had a severe reaction to latex? no    Vaccine information given and explained to patient? yes    Are you currently pregnant? no

## 2010-09-12 NOTE — Assessment & Plan Note (Signed)
Summary: flu shot,tcb  Nurse Visit  FLU SHOT GIVEN TODAY.Jimmy Footman, CMA  May 17, 2010 10:26 AM   Vital Signs:  Patient profile:   55 year old female Temp:     98.4 degrees F oral  Vitals Entered By: Jimmy Footman, CMA (May 17, 2010 10:25 AM) CC: flu shot   Allergies: 1)  Sulfamethoxazole (Sulfamethoxazole)  Immunizations Administered:  Influenza Vaccine # 1:    Vaccine Type: Fluvax Non-MCR    Site: right deltoid    Mfr: Sanofi Pasteur    Dose: 0.5 ml    Route: IM    Given by: Jimmy Footman, CMA    Exp. Date: 02/07/2011    Lot #: [id number]    VIS given: 03/07/10 version given May 17, 2010.  Flu Vaccine Consent Questions:    Do you have a history of severe allergic reactions to this vaccine? no    Any prior history of allergic reactions to egg and/or gelatin? no    Do you have a sensitivity to the preservative Thimersol? no    Do you have a past history of Guillan-Barre Syndrome? no    Do you currently have an acute febrile illness? no    Have you ever had a severe reaction to latex? no    Vaccine information given and explained to patient? yes    Are you currently pregnant? no  Orders Added: 1)  Admin 1st Vaccine [90471] 2)  Influenza Vaccine NON MCR [00028]

## 2010-09-12 NOTE — Progress Notes (Signed)
Summary: Rx Req  Phone Note Refill Request Call back at [phone] Message from:  Patient  Refills Requested: Medication #1:  AMLODIPINE BESYLATE 10 MG TABS 1 tab by mouth daily PT IS OUT OF MEDICATION. [facility] [address]  Initial call taken by: Clydell Hakim,  June 06, 2010 9:08 AM  Follow-up for Phone Call        will forward to MD. Follow-up by: Theresia Lo RN,  June 06, 2010 9:55 AM    Prescriptions: AMLODIPINE BESYLATE 10 MG TABS (AMLODIPINE BESYLATE) 1 tab by mouth daily  #90 x 1   Entered and Authorized by:   Renold Don MD   Signed by:   Renold Don MD on 06/06/2010   Method used:   Electronically to        [facility] [address] (retail)       [phone]       [place], [place]  [address]       Ph: [phone]       Fax: [phone]   RxID:   [id number]   Appended Document: Rx Req pt states that it should be called into [facility] [address]

## 2010-09-12 NOTE — Assessment & Plan Note (Signed)
Summary: BP follow up.   Vital Signs:  Patient profile:   55 year old female Weight:      161 pounds Temp:     98.5 degrees F oral Pulse rate:   69 / minute BP sitting:   124 / 74  (left arm) Cuff size:   regular  Vitals Entered By: Loralee Pacas CMA (June 28, 2010 1:40 PM) CC: bp Is Patient Diabetic? Yes Did you bring your meter with you today? No Comments sometimes her hands and arm get numb for a few mins and will go away   Primary Care Provider:  Renold Don MD  CC:  bp.  History of Present Illness: 1.  Blood pressure:  Controlled on Amlodipine/HCTZ, Metoprolol. Stress, concerned about grandson who has been in and out of jail.  Feels this has been contributing in past to her fluctuating blood pressures.  Not checking this regularly.  STates that son is currently in jail and she is happy about that, stating that at least he is safe in jail.  Otherwise, no syncopal episodes, chest pain, shortness of breath, palpitations.    2.  Diabetes:  Question of lightheadedness when she has not eaten all day.  No dizziness.  Still taking metformin and glipizide daily.  Checks sugars once daily.  Running 120-170.   No adverse effects to meds.  Still has not obtained diabetic eye exam.  3. HLD: Tolerating medication.  No adverse effects.  No muscle pain or abd pain.  4.  Pain in hands:  Pain in fingers, described as tingling when she is working.  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  Controlled with 200 mg Ibuprofen.  No pain at night, no pain except when using fingers.  Started several months ago.      Habits & Providers  Alcohol-Tobacco-Diet     Tobacco Status: quit     Tobacco Counseling: to quit use of tobacco products     Year Quit: 2000  Exercise-Depression-Behavior     Does Patient Exercise: no     Exercise Counseling: to improve exercise regimen     Have you felt down or hopeless? no     Have you felt little pleasure in things? no     Depression  Counseling: not indicated; screening negative for depression     Seat Belt Use: always  Current Problems (verified): 1)  Cataract, Hx of  (ICD-V12.40) 2)  Need Prophylactic Vaccination&inoculation Flu  (ICD-V04.81) 3)  Conjunctivitis  (ICD-372.30) 4)  Syncope  (ICD-780.2) 5)  Health Maintenance Exam  (ICD-V70.0) 6)  Diabetes Mellitus, Type II  (ICD-250.00) 7)  Hypertension, Benign Systemic  (ICD-401.1) 8)  Hyperlipidemia  (ICD-272.4) 9)  Obesity, Unspecified  (ICD-278.00) 10)  Glaucoma  (ICD-365.9) 11)  Systolic Murmur  (ICD-785.2) 12)  Hemorrhoids, Nos  (ICD-455.6)  Current Medications (verified): 1)  Lisinopril-Hydrochlorothiazide 20-25 Mg Tabs (Lisinopril-Hydrochlorothiazide) .Marland Kitchen.. 1 Tab By Mouth Daily 2)  Metoprolol Tartrate 50 Mg Tabs (Metoprolol Tartrate) .... Take 1 Tablet By Mouth Two Times A Day 3)  Tgt Aspirin 81 Mg Tbec (Aspirin) .... Take 1 Tablet By Mouth Once A Day 4)  Womens Multivitamin Plus  Tabs (Multiple Vitamins-Minerals) 5)  Metformin Hcl 1000 Mg Tabs (Metformin Hcl) .... 1/2 Tab in The Morning and 1 Tab in The Evening 6)  Test Strips, Lancets .... Use To Check Your Blood Sugar Every Morning Before Breakfast - Disp Qs For 90 Day Supply 7)  Simvastatin 40 Mg Tabs (Simvastatin) .Marland Kitchen.. 1 Tablet  By Mouth Daily For Cholesterol 8)  Amlodipine Besylate 10 Mg Tabs (Amlodipine Besylate) .Marland Kitchen.. 1 Tab By Mouth Daily 9)  [facility] Pink Eye  Soln (Homeopathic Products) .Marland Kitchen.. 1-2 Drops in Affected Eye Q4h For Eye Symptoms. Sharl Ma Generic Alternative.  Allergies (verified): 1)  Sulfamethoxazole (Sulfamethoxazole)  Past History:  Past medical, surgical, family and social histories (including risk factors) reviewed, and no changes noted (except as noted below).  Past Medical History: Reviewed history from 09/29/2008 and no changes required. [id number], NSVD x 3 at term without complications h/o anemia (hgb 5.7-->13 with iron supplement) h/o trich, Chlamydia Post-menopausal since mid  2006  Past Surgical History: Reviewed history from 01/20/2007 and no changes required. Tubal ligation - 07/13/1976  Family History: Reviewed history from 04/17/2007 and no changes required. 8 brothers/sisters - multiple with HTN Father - Healthy, HTN, DM runs in [place] Mother - DM Aunt with DM, PVD  Social History: Reviewed history from 06/23/2008 and no changes required. 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    Physical Exam  General:  Well-developed,well-nourished,in no acute distress; alert,appropriate and cooperative throughout examination Eyes:  No corneal inflammation noted. EOMI. Perrla.  cataracts noted BL.   Mouth:  Oral mucosa and oropharynx without lesions or exudates.  Teeth in good repair. Neck:  No deformities, masses, or tenderness noted. Lungs:  clear to auscultation bilaterally without wheezing, rales, or rhonchi.  Normal work of breathing  Heart:  Regular rate and rhythm without murmur, rub, or gallop.  Normal S1/S2  Abdomen:  soft/nondistended/nontender.  Bowel sounds present and normoactive.  No organomegaly noted.   Extremities:  No clubbing, cyanosis, edema, or deformity noted with normal full range of motion of all joints.   Neurologic:  No focal deficits.     Impression & Recommendations:  Problem # 1:  HYPERTENSION, BENIGN SYSTEMIC (ICD-401.1) Assessment Unchanged At goal.  No medication changes.   Her updated medication list for this problem includes:    Lisinopril-hydrochlorothiazide 20-25 Mg Tabs (Lisinopril-hydrochlorothiazide) .Marland Kitchen... 1 tab by  mouth daily    Metoprolol Tartrate 50 Mg Tabs (Metoprolol tartrate) .Marland Kitchen... Take 1 tablet by mouth two times a day    Amlodipine Besylate 10 Mg Tabs (Amlodipine besylate) .Marland Kitchen... 1 tab by mouth daily  Orders: [facility]- Est  Level 4 (45409)  Problem # 2:  DIABETES MELLITUS, TYPE II (ICD-250.00) Assessment: Improved A1C 6.9 today.  Much improved from previous 7.9.  Plan to continue current regimen.  Her updated medication list for this problem includes:    Lisinopril-hydrochlorothiazide 20-25 Mg Tabs (Lisinopril-hydrochlorothiazide) .Marland Kitchen... 1 tab by mouth daily    Tgt Aspirin 81 Mg Tbec (Aspirin) .Marland Kitchen... Take 1 tablet by mouth once a day    Metformin Hcl 1000 Mg Tabs (Metformin hcl) .Marland Kitchen... 1/2 tab in the morning and 1 tab in the evening  Orders: A1C-[facility] (81191) Ophthalmology Referral (Ophthalmology)  Problem # 3:  HYPERLIPIDEMIA (ICD-272.4) Assessment: Comment Only Due for FLP and CMET at next appt.   Her updated medication list for this problem includes:    Simvastatin 40 Mg Tabs (Simvastatin) .Marland Kitchen... 1 tablet by mouth daily for cholesterol  Complete  Medication List: 1)  Lisinopril-hydrochlorothiazide 20-25 Mg Tabs (Lisinopril-hydrochlorothiazide) .Marland Kitchen.. 1 tab by mouth daily 2)  Metoprolol Tartrate 50 Mg Tabs (Metoprolol tartrate) .... Take 1 tablet by mouth two times a day 3)  Tgt Aspirin 81 Mg Tbec (Aspirin) .... Take 1 tablet by mouth once a day 4)  Womens Multivitamin Plus Tabs (Multiple vitamins-minerals) 5)  Metformin Hcl 1000 Mg Tabs (Metformin hcl) .... 1/2 tab in the morning and 1 tab in the evening 6)  Test Strips, Lancets  .... Use to check your blood sugar every morning before breakfast - disp qs for 90 day supply 7)  Simvastatin 40 Mg Tabs (Simvastatin) .Marland Kitchen.. 1 tablet by mouth daily for cholesterol 8)  Amlodipine Besylate 10 Mg Tabs (Amlodipine besylate) .Marland Kitchen.. 1 tab by mouth daily 9)  [facility] Pink Eye Soln (Homeopathic products) .Marland Kitchen.. 1-2 drops in affected eye q4h for eye symptoms. kerr generic  alternative.  Patient Instructions: 1)  You're doing well today. 2)  No changes to your medicine.   3)  We'll send in your refills.  4)  We'll get you set up with an eye doctor.   5)  Make an appt to see me again in 4-6 months and we'll check your labs then.  6)  Good to meet you! Prescriptions: METOPROLOL TARTRATE 50 MG TABS (METOPROLOL TARTRATE) Take 1 tablet by mouth two times a day  #90 x 2   Entered and Authorized by:   Renold Don MD   Signed by:   Renold Don MD on 06/28/2010   Method used:   Electronically to        [facility] [phone]* (retail)       [address]       [place], [place]  [address]       Ph: [phone]       Fax: [phone]   RxID:   [id number] METFORMIN HCL 1000 MG TABS (METFORMIN HCL) 1/2 tab in the morning and 1 tab in the evening  #90 x 2   Entered and Authorized by:   Renold Don MD   Signed by:   Renold Don MD on 06/28/2010   Method used:   Electronically to        [facility] [phone]* (retail)       [address]       [place], [place]  [address]       Ph: [phone]       Fax: [phone]   RxID:   [id number] LISINOPRIL-HYDROCHLOROTHIAZIDE 20-25 MG TABS (LISINOPRIL-HYDROCHLOROTHIAZIDE) 1 tab by mouth daily  #90 x 1   Entered and Authorized by:   Renold Don MD   Signed by:   Renold Don MD on 06/28/2010   Method used:   Electronically to        [facility] [phone]* (retail)       [address]       [place], [place]  [address]       Ph: [phone]       Fax: [phone]   RxID:   [id number] SIMVASTATIN 40 MG TABS (SIMVASTATIN) 1 tablet by mouth daily for cholesterol  #30 x 5   Entered and Authorized by:   Renold Don MD   Signed by:   Renold Don MD on 06/28/2010   Method used:   Print then Give to Patient   RxID:   [id number] AMLODIPINE BESYLATE 10 MG TABS (AMLODIPINE BESYLATE) 1 tab by mouth daily  #90 x 2  Entered and Authorized by:   Renold Don MD   Signed by:   Renold Don MD on  06/28/2010   Method used:   Print then Give to Patient   RxID:   [id number]    Orders Added: 1)  A1C-[facility] [83036] 2)  Ophthalmology Referral [Ophthalmology] 3)  [facility]- Est  Level 4 [[address]]    Laboratory Results   Blood Tests   Date/Time Received: June 28, 2010 1:46 PM  Date/Time Reported: June 28, 2010 2:34 PM   HGBA1C: 6.9%   (Normal Range: Non-Diabetic - 3-6%   Control Diabetic - 6-8%)  Comments: ...............test performed by......Marland KitchenBonnie A. [place], MLS (ASCP)cm

## 2010-09-12 NOTE — Progress Notes (Signed)
Summary: refill  Phone Note Refill Request Call back at Home Phone [phone] Message from:  Patient  Refills Requested: Medication #1:  HYDROCHLOROTHIAZIDE 25 MG TABS Take 1 tablet by mouth once a day  Medication #2:  LISINOPRIL 20 MG TABS 1 tablet by mouth daily for BP  Medication #3:  SIMVASTATIN 40 MG TABS 1 tablet by mouth daily for cholesterol  Medication #4:  METOPROLOL TARTRATE 50 MG TABS Take 1 tablet by mouth two times a day pt is now out  Initial call taken by: De Nurse,  Dec 23, 2009 9:19 AM  Follow-up for Phone Call        Asked staff to contact pt for office appt to discuss meds b/c we will likely combine meds.  Will provide 30 day supply in the meantime. Follow-up by: Marisue Ivan  MD,  Dec 23, 2009 10:07 AM    Prescriptions: SIMVASTATIN 40 MG TABS (SIMVASTATIN) 1 tablet by mouth daily for cholesterol  #30 x 0   Entered by:   Marisue Ivan  MD   Authorized by:   De Nurse   Signed by:   Marisue Ivan  MD on 12/23/2009   Method used:   Faxed to ...       [facility] Department (retail)       [address] [place], [place]  [address]       Ph: [phone]       Fax: [phone]   RxID:   [id number] METOPROLOL TARTRATE 50 MG TABS (METOPROLOL TARTRATE) Take 1 tablet by mouth two times a day  #60 x 0   Entered by:   Marisue Ivan  MD   Authorized by:   De Nurse   Signed by:   Marisue Ivan  MD on 12/23/2009   Method used:   Faxed to ...       [facility] Department (retail)       [address] [place], [place]  [address]       Ph: [phone]       Fax: [phone]   RxID:   [id number] LISINOPRIL 20 MG TABS (LISINOPRIL) 1 tablet by mouth daily for BP  #30 x 0   Entered by:   Marisue Ivan  MD   Authorized by:   De Nurse   Signed by:   Marisue Ivan  MD on 12/23/2009   Method used:   Faxed to ...       [facility] Department (retail)       [address] [place], [place]  [address]       Ph: [phone]       Fax: [phone]   RxID:   [id number] HYDROCHLOROTHIAZIDE 25 MG TABS (HYDROCHLOROTHIAZIDE) Take 1 tablet by mouth once a day  #30 x 0   Entered by:   Marisue Ivan  MD   Authorized by:   De Nurse   Signed by:   Marisue Ivan  MD on 12/23/2009   Method used:   Faxed to ...       [facility] Department (retail)       [address] [place], [place]  [address]       Ph: [phone]       Fax: [phone]   RxID:   [id number]  Pt. contacted, will call for appt.  States pharmacy is [facility]. Starleen Blue RN  Dec 23, 2009 11:13 AM

## 2010-09-12 NOTE — Letter (Signed)
Summary: Generic Letter  [facility]  [address]   [place], [place] [address]   Phone: [phone]  Fax: [phone]    07/12/2010  Megan Khan [address] [place], [place]  [address]  Dear [name],   you have an Ophthalmology appointment with [facility] located at [address] on January 31,2012 at 9:00am.  If you cannot keep this appointment please call their office at [phone] at least 24 hours in advance to cancel or reschedule.    Sincerely,   Loralee Pacas CMA

## 2010-09-12 NOTE — Miscellaneous (Signed)
Summary: regarding meds  Clinical Lists Changes   RN notified patient that MD has called Rx to [facility] for # 30 tabs.  she states she can get # 90 tabs for the same price as # 30 and wants to get # 90 tabs. . RN then advised patient that MD needs to see her in order to prescribe  further refills. I tried to schedule an appointment but Dr. 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 will forward message to MD to ask for further directions or suggestions . Theresia Lo RN  June 07, 2010 3:34 PM  Will refill for 90 pills.  Appt to be made when she is available.  Renold Don MD  June 08, 2010 1:31 PM  . called pharmacy and patient went ahead and picked up the # 30 so they cannot add anymore tabs to that RX. patient notified and she understands. appointment scheduled with Dr. Gwendolyn Grant  for  Nov 16. will get refills at that time. Theresia Lo RN  June 08, 2010 4:31 PM

## 2010-09-12 NOTE — Assessment & Plan Note (Signed)
Summary: pink eye/Spreckels/walden   Vital Signs:  Patient profile:   55 year old female Height:      63 inches Weight:      163 pounds Temp:     98.0 degrees F oral BP sitting:   117 / 76  (right arm) Cuff size:   regular  Vitals Entered By: Tessie Fass CMA (March 28, 2010 11:37 AM) CC: pink eye    Primary Care Provider:  Renold Don MD  CC:  pink eye .  History of Present Illness: 55 yo female with eye complaint x several days.  Eyes are mildly red, draining clear mucous, stuck shut in the mornings, no visual changes, does have URI symptoms.  Other members in the household have similar symptoms.  No headaches, no fevers/chills/N/V/D/C.  No SOB, no CP.  Eyes feel gritty, scratchy.    Current Medications (verified): 1)  Lisinopril-Hydrochlorothiazide 20-25 Mg Tabs (Lisinopril-Hydrochlorothiazide) .Marland Kitchen.. 1 Tab By Mouth Daily 2)  Metoprolol Tartrate 50 Mg Tabs (Metoprolol Tartrate) .... Take 1 Tablet By Mouth Two Times A Day 3)  Tgt Aspirin 81 Mg Tbec (Aspirin) .... Take 1 Tablet By Mouth Once A Day 4)  Womens Multivitamin Plus  Tabs (Multiple Vitamins-Minerals) 5)  Metformin Hcl 1000 Mg Tabs (Metformin Hcl) .... 1/2 Tab in The Morning and 1 Tab in The Evening 6)  Test Strips, Lancets .... Use To Check Your Blood Sugar Every Morning Before Breakfast - Disp Qs For 90 Day Supply 7)  Simvastatin 40 Mg Tabs (Simvastatin) .Marland Kitchen.. 1 Tablet By Mouth Daily For Cholesterol 8)  Amlodipine Besylate 10 Mg Tabs (Amlodipine Besylate) .Marland Kitchen.. 1 Tab By Mouth Daily 9)  [facility] Pink Eye  Soln (Homeopathic Products) .Marland Kitchen.. 1-2 Drops in Affected Eye Q4h For Eye Symptoms. Sharl Ma Generic Alternative.  Allergies (verified): 1)  Sulfamethoxazole (Sulfamethoxazole)  Review of Systems       See HPI  Physical Exam  General:  Well-developed,well-nourished,in no acute distress; alert,appropriate and cooperative throughout examination Head:  Normocephalic and atraumatic without obvious abnormalities.  Eyes:  No corneal  inflammation noted. EOMI. Perrla.  Minimal conjunctival injection bilateral. Ears:  External ear exam shows no significant lesions or deformities.   Nose:  External nasal examination shows no deformity or inflammation. Mouth:  Oral mucosa and oropharynx without lesions or exudates.  Teeth in good repair. Neck:  No deformities, masses, or tenderness noted. Lungs:  Normal respiratory effort, chest expands symmetrically. Lungs are clear to auscultation, no crackles or wheezes. Heart:  Normal rate and regular rhythm. S1 and S2 normal without gallop, murmur, click, rub or other extra sounds.   Impression & Recommendations:  Problem # 1:  CONJUNCTIVITIS (ICD-372.30) Assessment New Suspect viral, no abx, did rx homeopathic pink-eye remedy.  Handouts given.  Red flags advised.    Orders: [facility]- Est Level  3 ([address])  Complete Medication List: 1)  Lisinopril-hydrochlorothiazide 20-25 Mg Tabs (Lisinopril-hydrochlorothiazide) .Marland Kitchen.. 1 tab by mouth daily 2)  Metoprolol Tartrate 50 Mg Tabs (Metoprolol tartrate) .... Take 1 tablet by mouth two times a day 3)  Tgt Aspirin 81 Mg Tbec (Aspirin) .... Take 1 tablet by mouth once a day 4)  Womens Multivitamin Plus Tabs (Multiple vitamins-minerals) 5)  Metformin Hcl 1000 Mg Tabs (Metformin hcl) .... 1/2 tab in the morning and 1 tab in the evening 6)  Test Strips, Lancets  .... Use to check your blood sugar every morning before breakfast - disp qs for 90 day supply 7)  Simvastatin 40 Mg Tabs (Simvastatin) .Marland KitchenMarland KitchenMarland Kitchen 1  tablet by mouth daily for cholesterol 8)  Amlodipine Besylate 10 Mg Tabs (Amlodipine besylate) .Marland Kitchen.. 1 tab by mouth daily 9)  [facility] Pink Eye Soln (Homeopathic products) .Marland Kitchen.. 1-2 drops in affected eye q4h for eye symptoms. kerr generic alternative. Prescriptions: [facility] PINK EYE  SOLN (HOMEOPATHIC PRODUCTS) 1-2 drops in affected eye q4h for eye symptoms. Sharl Ma generic alternative.  #1 bottle x 0   Entered and Authorized by:   Rodney Langton MD   Signed by:    Rodney Langton MD on 03/28/2010   Method used:   Print then Give to Patient   RxID:   [id number]

## 2010-09-12 NOTE — Progress Notes (Signed)
  Phone Note Refill Request Call back at [phone]   Refills Requested: Medication #1:  AMLODIPINE BESYLATE 10 MG TABS 1 tab by mouth daily Ms. Sharlet Salina called [facility] and was told rx was cancelled by doctor's office.  Need it fax or called to [facility] on [address].  Initial call taken by: Abundio Miu,  June 07, 2010 11:38 AM  Follow-up for Phone Call        This was canceled b/c she needed to make an appt to meet her new PCP.  She has not be seen since 01/2010 except as work-in.  Will refill 1 month supply but she needs an appt before any further refills.  Called in appt via the phone to [facility] Follow-up by: Renold Don MD,  June 07, 2010 2:54 PM    Prescriptions: AMLODIPINE BESYLATE 10 MG TABS (AMLODIPINE BESYLATE) 1 tab by mouth daily  #30 x 0   Entered by:   Renold Don MD   Authorized by:   . RN TEAM-[facility]   Signed by:   Renold Don MD on 06/07/2010   Method used:   Print then Give to Patient   RxID:   [id number]

## 2010-09-12 NOTE — Progress Notes (Signed)
Summary: refill  Phone Note Refill Request Call back at [phone] Message from:  Patient  Refills Requested: Medication #1:  AMLODIPINE BESYLATE 10 MG TABS 1 tab by mouth daily [facility] [address]  Initial call taken by: De Nurse,  May 08, 2010 11:46 AM    Prescriptions: AMLODIPINE BESYLATE 10 MG TABS (AMLODIPINE BESYLATE) 1 tab by mouth daily  #90 x 1   Entered and Authorized by:   Renold Don MD   Signed by:   Renold Don MD on 05/08/2010   Method used:   Print then Give to Patient   RxID:   [id number]

## 2010-09-12 NOTE — Progress Notes (Signed)
Summary: refill  Phone Note Refill Request Call back at [phone] Message from:  Patient  Refills Requested: Medication #1:  METOPROLOL TARTRATE 50 MG TABS Take 1 tablet by mouth two times a day   Dosage confirmed as above?Dosage Confirmed   Brand Name Necessary? No   Supply Requested: 3 months  Medication #2:  METFORMIN HCL 1000 MG TABS 1/2 tab in the morning and 1 tab in the evening   Dosage confirmed as above?Dosage Confirmed   Brand Name Necessary? No  Medication #3:  LISINOPRIL-HYDROCHLOROTHIAZIDE 20-25 MG TABS 1 tab by mouth daily   Dosage confirmed as above?Dosage Confirmed   Brand Name Necessary? No [facility]  Initial call taken by: De Nurse,  April 11, 2010 4:54 PM    Prescriptions: METFORMIN HCL 1000 MG TABS (METFORMIN HCL) 1/2 tab in the morning and 1 tab in the evening  #90 x 2   Entered and Authorized by:   Renold Don MD   Signed by:   Renold Don MD on 04/12/2010   Method used:   Electronically to        [facility] [phone]* (retail)       [address]       [place], [place]  [address]       Ph: [phone]       Fax: [phone]   RxID:   [id number] METOPROLOL TARTRATE 50 MG TABS (METOPROLOL TARTRATE) Take 1 tablet by mouth two times a day  #90 x 2   Entered and Authorized by:   Renold Don MD   Signed by:   Renold Don MD on 04/12/2010   Method used:   Electronically to        [facility] [phone]* (retail)       [address]       [place], [place]  [address]       Ph: [phone]       Fax: [phone]   RxID:   [id number] LISINOPRIL-HYDROCHLOROTHIAZIDE 20-25 MG TABS (LISINOPRIL-HYDROCHLOROTHIAZIDE) 1 tab by mouth daily  #90 x 1   Entered and Authorized by:   Renold Don MD   Signed by:   Renold Don MD on 04/12/2010   Method used:   Electronically to        [facility] [phone]* (retail)       [address]       [place], [place]  [address]       Ph: [phone]       Fax: [phone]   RxID:    [id number]

## 2010-09-12 NOTE — Miscellaneous (Signed)
Summary: walk in  Clinical Lists Changes states she was told to be here at 11 for a work in. thinks she has pink eye. placed in work in schedule. same for her son.Golden Circle RN  March 28, 2010 11:19 AM

## 2010-09-12 NOTE — Assessment & Plan Note (Signed)
Summary: 55yo F wellness exam   Vital Signs:  Patient profile:   55 year old female Height:      63 inches Weight:      170.3 pounds BMI:     30.28 Temp:     64 degrees F oral Pulse rate:   64 / minute BP sitting:   175 / 108  (left arm) Cuff size:   regular  Vitals Entered By: Gladstone Pih (September 27, 2009 10:12 AM)  Serial Vital Signs/Assessments:  Time      Position  BP       Pulse  Resp  Temp     By 10:30 AM            158/100                        Gladstone Pih  Comments: 10:30 AM rechecked manually By: Gladstone Pih   CC: F/U BP Is Patient Diabetic? Yes Did you bring your meter with you today? No Pain Assessment Patient in pain? no        Primary Care Provider:  Marisue Ivan  MD  CC:  F/U BP.  History of Present Illness: 55yo F here for wellness exam  Concerns: Vaginal discharge.  Wants to be evaluated.  No vag bleeding.  No pelvic/abd pain.  HTN: No adverse effects from medication.  Not checking it regularly but when she does, admits to it being elevated 180s systolic.  Was well controlled at last visit.  No dizziness, HA, CP, palpitations, or swelling.  DM:  Currently on  Metformin.  No adverse effects.  No hypoglycemic events.  No paresthesia or peripheral nerve pain.  CBGs checked daily and typically run b/w 110s. Due for diabetic eye exam.  HLD: Tolerating medication.  No adverse effects.  No muscle pain or abd pain.  Preventative: Next colonoscopy due 12/2011.  Mammogram due now.  She would like to repeat pap smear every 3 years.    Habits & Providers  Alcohol-Tobacco-Diet     Tobacco Status: never  Current Medications (verified): 1)  Hydrochlorothiazide 25 Mg Tabs (Hydrochlorothiazide) .... Take 1 Tablet By Mouth Once A Day 2)  Lisinopril 20 Mg Tabs (Lisinopril) .Marland Kitchen.. 1 Tablet By Mouth Daily For Bp 3)  Metoprolol Tartrate 50 Mg Tabs (Metoprolol Tartrate) .... Take 1 Tablet By Mouth in The Morning and 2 Tablets At Night 4)  Tgt Aspirin  81 Mg Tbec (Aspirin) .... Take 1 Tablet By Mouth Once A Day 5)  Womens Multivitamin Plus  Tabs (Multiple Vitamins-Minerals) 6)  Metformin Hcl 500 Mg Tabs (Metformin Hcl) .Marland Kitchen.. 1 Tablet By Mouth Two Times A Day With Food 7)  Test Strips, Lancets .... Use To Check Your Blood Sugar Every Morning Before Breakfast - Disp Qs For 90 Day Supply 8)  Simvastatin 40 Mg Tabs (Simvastatin) .Marland Kitchen.. 1 Tablet By Mouth Daily For Cholesterol 9)  Amlodipine Besylate 5 Mg Tabs (Amlodipine Besylate) .Marland Kitchen.. 1 Tab By Mouth Daily For High Blood Pressure  Allergies (verified): 1)  Sulfamethoxazole (Sulfamethoxazole)  Social History: Smoking Status:  never  Review of Systems       No vag bleeding.  No pelvic/abd pain.  No dizziness, HA, CP, palpitations, or swelling. No muscle pain or abd pain.  Physical Exam  General:  VS Reviewed. Well appearing, NAD.  Head:  normocephalic and atraumatic.   Eyes:  EOMI  Neck:  supple, full ROM, no goiter or mass  Lungs:  Normal  respiratory effort, chest expands symmetrically. Lungs are clear to auscultation, no crackles or wheezes. Heart:  Normal rate and regular rhythm. S1 and S2 normal without gallop, murmur, click, rub or other extra sounds. Abdomen:  Soft, NT, ND, no HSM, active BS  Genitalia:  no external lesions.   speculum exam: Nl vaginal mucosa.  White thick discharge in vaginal vault.  Latex debris found and removed from vaginal vault.  Cervix appeared healthy and nl without lesions. Bimanual: No cervical motion tenderness; nl uterus; no adnexal mass present Extremities:  no edema Neurologic:  no focal deficits Skin:  no lesions   Impression & Recommendations:  Problem # 1:  Preventive Health Care (ICD-V70.0) Assessment Unchanged Mammogram ordered today.  Colonoscopy to be repeated in 12/2011.  Next pap smear due 10/2011.    Problem # 2:  HYPERTENSION, BENIGN SYSTEMIC (ICD-401.1) Assessment: Unchanged Not at goal (<140/90).  Will add amlodipine 5mg  daily.   Have her return in 1 month to reassess. Check renal function and electrolytes today.  Her updated medication list for this problem includes:    Hydrochlorothiazide 25 Mg Tabs (Hydrochlorothiazide) .Marland Kitchen... Take 1 tablet by mouth once a day    Lisinopril 20 Mg Tabs (Lisinopril) .Marland Kitchen... 1 tablet by mouth daily for bp    Metoprolol Tartrate 50 Mg Tabs (Metoprolol tartrate) .Marland Kitchen... Take 1 tablet by mouth in the morning and 2 tablets at night    Amlodipine Besylate 5 Mg Tabs (Amlodipine besylate) .Marland Kitchen... 1 tab by mouth daily for high blood pressure  Problem # 3:  VAGINAL DISCHARGE (ICD-623.5) Assessment: New Check GC/Chl and wet prep.  Orders: [facility]lam[facility] (87591/87491) [facility]  Problem # 4:  DIABETES MELLITUS, TYPE II (ICD-250.00) Assessment: Deteriorated A1c worsened prior to previous check.   Cont on current Metformin and counseled on diet and exercise since pt is reluctant to inc the medication. Reassess in 3-4 months. Diabetic eye exam ordered.  Her updated medication list for this problem includes:    Lisinopril 20 Mg Tabs (Lisinopril) .Marland Kitchen... 1 tablet by mouth daily for bp    Tgt Aspirin 81 Mg Tbec (Aspirin) .Marland Kitchen... Take 1 tablet by mouth once a day    Metformin Hcl 500 Mg Tabs (Metformin hcl) .Marland Kitchen... 1 tablet by mouth two times a day with food  Orders: A1C-[facility] ([address])  Problem # 5:  HYPERLIPIDEMIA (ICD-272.4) Assessment: Unchanged At goal (<100).  No changes to regimen. Check LFTS today.  Her updated medication list for this problem includes:    Simvastatin 40 Mg Tabs (Simvastatin) .Marland Kitchen... 1 tablet by mouth daily for cholesterol  Complete Medication List: 1)  Hydrochlorothiazide 25 Mg Tabs (Hydrochlorothiazide) .... Take 1 tablet by mouth once a day 2)  Lisinopril 20 Mg Tabs (Lisinopril) .Marland Kitchen.. 1 tablet by mouth daily for bp 3)  Metoprolol Tartrate 50 Mg Tabs (Metoprolol tartrate) .... Take 1 tablet by mouth in the morning and 2 tablets at night 4)  Tgt Aspirin 81 Mg  Tbec (Aspirin) .... Take 1 tablet by mouth once a day 5)  Womens Multivitamin Plus Tabs (Multiple vitamins-minerals) 6)  Metformin Hcl 500 Mg Tabs (Metformin hcl) .Marland Kitchen.. 1 tablet by mouth two times a day with food 7)  Test Strips, Lancets  .... Use to check your blood sugar every morning before breakfast - disp qs for 90 day supply 8)  Simvastatin 40 Mg Tabs (Simvastatin) .Marland Kitchen.. 1 tablet by mouth daily for cholesterol 9)  Amlodipine Besylate 5 Mg Tabs (Amlodipine besylate) .Marland Kitchen.. 1 tab by mouth  daily for high blood pressure  Other Orders: Mammogram (Screening) (Mammo) T-Comprehensive Metabolic Panel ([id number]) [facility] - Est  40-64 yrs ([address])  Patient Instructions: 1)  Please schedule a follow-up appointment in 1 month to recheck blood pressure status. 2)  We started you on a new medication called Amlodipine 5mg  for blood pressure. 3)  I want you to check your blood pressure at home for the next week and contact me to let me know how it is measuring. 4)  I'll call you with your lab results. Prescriptions: AMLODIPINE BESYLATE 5 MG TABS (AMLODIPINE BESYLATE) 1 tab by mouth daily for high blood pressure  #30 x 1   Entered and Authorized by:   Marisue Ivan  MD   Signed by:   Marisue Ivan  MD on 09/27/2009   Method used:   Faxed to ...       [facility] Department (retail)       [address] [place], [place]  [address]       Ph: [phone]       Fax: [phone]   RxID:   [id number]   Laboratory Results   Blood Tests   Date/Time Received: September 27, 2009 10:27 AM  Date/Time Reported: September 27, 2009 10:41 AM   HGBA1C: 7.3%   (Normal Range: Non-Diabetic - 3-6%   Control Diabetic - 6-8%)  Comments: ...............test performed by......Marland KitchenBonnie A. [place], MLS (ASCP)cm  Date/Time Received: September 27, 2009 11:00 AM  Date/Time Reported: September 27, 2009 11:13 AM   [employer] Source: vag WBC/hpf: 1-5 Bacteria/hpf: 3+  Cocci Clue cells/hpf:  many  Positive whiff Yeast/hpf: none Trichomonas/hpf: none Comments: ...............test performed by......Marland KitchenBonnie A. [place], MLS (ASCP)cm     Prevention & Chronic Care Immunizations   Influenza vaccine: Fluvax 3+  (09/07/2009)   Influenza vaccine deferral: Not available  (05/02/2009)   Influenza vaccine due: 05/26/2009    Tetanus booster: 01/16/2005: Done.   Tetanus booster due: 01/17/2015    Pneumococcal vaccine: Not documented  Colorectal Screening   Hemoccult: Not documented   Hemoccult due: Not Indicated    Colonoscopy: abnormal  (12/21/2008)   Colonoscopy due: 12/22/2011  Other Screening   Pap smear: NEGATIVE FOR INTRAEPITHELIAL LESIONS OR MALIGNANCY.  (09/29/2008)   Pap smear due: 09/30/2011    Mammogram: ASSESSMENT: Negative - BI-RADS 1^MM DIGITAL SCREENING  (10/21/2008)   Mammogram action/deferral: Ordered  (09/27/2009)   Mammogram due: 10/21/2009   Smoking status: never  (09/27/2009)  Diabetes Mellitus   HgbA1C: 7.3  (09/27/2009)   HgbA1C action/deferral: Ordered  (05/02/2009)   Hemoglobin A1C due: 12/27/2008    Eye exam: normal  (10/08/2008)   Eye exam due: 10/08/2009    Foot exam: yes  (01/26/2009)   High risk foot: Not documented   Foot care education: Not documented   Foot exam due: 06/23/2009    Urine microalbumin/creatinine ratio: Not documented   Urine microalbumin action/deferral: Not indicated   Urine microalbumin/cr due: 08/09/2009    Diabetes flowsheet reviewed?: Yes   Progress toward A1C goal: At goal  Lipids   Total Cholesterol: 195  (11/22/2008)   LDL: 120  (11/22/2008)   LDL Direct: 91  (01/26/2009)   HDL: 56  (11/22/2008)   Triglycerides: 93  (11/22/2008)    SGOT (AST): 12  (01/26/2009)   BMP action: Ordered   SGPT (ALT): 11  (01/26/2009) CMP ordered    Alkaline phosphatase: 67  (01/26/2009)   Total bilirubin: 0.3  (01/26/2009)  Lipid flowsheet reviewed?: Yes   Progress toward LDL goal: At goal  Hypertension    Last Blood Pressure: 175 / 108  (09/27/2009)   Serum creatinine: 0.69  (01/26/2009)   BMP action: Ordered   Serum potassium 3.5  (01/26/2009) CMP ordered     Hypertension flowsheet reviewed?: Yes   Progress toward BP goal: Deteriorated  Self-Management Support :   Personal Goals (by the next clinic visit) :     Personal A1C goal: 8  (09/27/2009)     Personal blood pressure goal: 140/90  (05/02/2009)     Personal LDL goal: 100  (05/02/2009)    Patient will work on the following items until the next clinic visit to reach self-care goals:     Medications and monitoring: take my medicines every day, check my blood sugar, check my blood pressure  (09/27/2009)     Eating: drink diet soda or water instead of juice or soda, eat more vegetables, use fresh or frozen vegetables, eat foods that are low in salt, eat baked foods instead of fried foods, eat fruit for snacks and desserts, limit or avoid alcohol  (09/27/2009)     Activity: take a 30 minute walk every day  (05/02/2009)    Diabetes self-management support: CBG self-monitoring log, Written self-care plan, Education handout  (09/27/2009)   Diabetes care plan printed   Diabetes education handout printed    Hypertension self-management support: Written self-care plan  (09/27/2009)   Hypertension self-care plan printed.    Lipid self-management support: Written self-care plan  (09/27/2009)   Lipid self-care plan printed.   Nursing Instructions: Schedule screening mammogram (see order)     Appended Document: opthal referral    Clinical Lists Changes  Orders: Added new Referral order of Ophthalmology Referral (Ophthalmology) - Signed

## 2010-09-22 ENCOUNTER — Ambulatory Visit (INDEPENDENT_AMBULATORY_CARE_PROVIDER_SITE_OTHER): Payer: Self-pay | Admitting: Family Medicine

## 2010-09-22 ENCOUNTER — Encounter: Payer: Self-pay | Admitting: Family Medicine

## 2010-09-22 DIAGNOSIS — E785 Hyperlipidemia, unspecified: Secondary | ICD-10-CM

## 2010-09-22 DIAGNOSIS — H409 Unspecified glaucoma: Secondary | ICD-10-CM

## 2010-09-22 DIAGNOSIS — M79609 Pain in unspecified limb: Secondary | ICD-10-CM

## 2010-09-22 DIAGNOSIS — I10 Essential (primary) hypertension: Secondary | ICD-10-CM

## 2010-09-22 DIAGNOSIS — M79643 Pain in unspecified hand: Secondary | ICD-10-CM

## 2010-09-22 DIAGNOSIS — E119 Type 2 diabetes mellitus without complications: Secondary | ICD-10-CM

## 2010-09-22 LAB — COMPREHENSIVE METABOLIC PANEL
AST: 14 U/L (ref 0–37)
Albumin: 4.6 g/dL (ref 3.5–5.2)
Alkaline Phosphatase: 66 U/L (ref 39–117)
Potassium: 3.8 mEq/L (ref 3.5–5.3)
Sodium: 141 mEq/L (ref 135–145)
Total Protein: 7 g/dL (ref 6.0–8.3)

## 2010-09-22 NOTE — Progress Notes (Signed)
  Subjective:    Patient ID: Megan Khan, female    DOB: [birth year], 55 y.o.   MRN: [id number]  HPI 1.  Blood pressure:  Controlled on Amlodipine/HCTZ, Metoprolol. Stress, concerned about grandson who has been in and out of jail.  Feels this has been contributing in past to her fluctuating blood pressures.  Not checking this regularly.  States that son is currently in jail and she is happy about that, stating that at least he is safe in jail.  Otherwise, no syncopal episodes, chest pain, shortness of breath, palpitations.    2.  Diabetes: No hypoglycemic episodes.  Still taking metformin and glipizide daily.  Checks sugars once daily.  Running 120-170, was 126 this AM after breakfast.   No adverse effects to meds.  Seen by Dr. Dione Booze for eye exam, see below.  3.  Glaucoma:  Diagnosed with BL Glaucoma by Dr. Dione Booze last month on visit.  Prescribed unknown eye medications which are providing relief from pressure.  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 Dione Booze.  About 30% vision right eye.  No eye pain currently.    3. HLD: Tolerating medication.  No adverse effects.  No muscle pain or abd pain.  4.  Pain in hands:  Was having pain in fingers, much improved since last exam.  Controlled with 200 mg Ibuprofen.  No pain at night, no pain except when using fingers.  Started several months ago.     Review of Systems See HPI above for review of systems.       Objective:   Physical Exam  Constitutional: She appears well-developed and well-nourished.  HENT:  Head: Normocephalic.  Mouth/Throat: Oropharynx is clear and moist.  Eyes: Conjunctivae and EOM are normal. Pupils are equal, round, and reactive to light.  Neck: Normal range of motion.  Cardiovascular: Normal rate, regular rhythm and normal heart sounds.   Pulmonary/Chest: Effort normal and breath sounds normal.  Abdominal: Soft. She exhibits no distension. There is no tenderness. There is no rebound.           Assessment & Plan:

## 2010-09-22 NOTE — Patient Instructions (Addendum)
Come back for a lab test so we can check your fasting lipids.  Make a lab appt for this.   Come back and see me in 4-6 months for a follow-up appointment.   We'll let you know the results of your test.

## 2010-09-24 DIAGNOSIS — M79643 Pain in unspecified hand: Secondary | ICD-10-CM | POA: Insufficient documentation

## 2010-09-24 NOTE — Assessment & Plan Note (Signed)
Patient not currently fasting.  Discussed need for fasting lipid panel.  Obtain CMET today.

## 2010-09-24 NOTE — Assessment & Plan Note (Signed)
Worsened.  Prescribed unknown eyedrops by ophtho for relief.  Encouraged to continue use and follow up with Dr. Dione Booze

## 2010-09-24 NOTE — Assessment & Plan Note (Signed)
Patient states she has not taken medications for past several days.  Plan to continue on current regimen.  Counseled on adherence to medications.  Will recheck at next visit.

## 2010-10-16 ENCOUNTER — Other Ambulatory Visit: Payer: Self-pay | Admitting: Family Medicine

## 2010-10-16 DIAGNOSIS — Z1231 Encounter for screening mammogram for malignant neoplasm of breast: Secondary | ICD-10-CM

## 2010-11-09 ENCOUNTER — Ambulatory Visit
Admission: RE | Admit: 2010-11-09 | Discharge: 2010-11-09 | Disposition: A | Discharge status: Home / Self Care | Payer: Self-pay | Source: Ambulatory Visit | Attending: *Deleted | Admitting: *Deleted

## 2010-11-09 DIAGNOSIS — Z1231 Encounter for screening mammogram for malignant neoplasm of breast: Secondary | ICD-10-CM

## 2010-12-11 ENCOUNTER — Encounter: Payer: Self-pay | Admitting: Sports Medicine

## 2010-12-11 ENCOUNTER — Ambulatory Visit (INDEPENDENT_AMBULATORY_CARE_PROVIDER_SITE_OTHER): Payer: Self-pay | Admitting: Sports Medicine

## 2010-12-11 VITALS — BP 121/85 | HR 81 | Temp 98.5°F | Ht 63.0 in | Wt 158.0 lb

## 2010-12-11 DIAGNOSIS — B9789 Other viral agents as the cause of diseases classified elsewhere: Secondary | ICD-10-CM

## 2010-12-11 DIAGNOSIS — B349 Viral infection, unspecified: Secondary | ICD-10-CM | POA: Insufficient documentation

## 2010-12-11 NOTE — Assessment & Plan Note (Signed)
Supportive tx. Theraflu as needed. RTC prn.

## 2010-12-11 NOTE — Patient Instructions (Addendum)
Great to see you,  Try Theraflu daytime powder packets.   Ihor Austin. Kriegel Stain, M.D.

## 2010-12-11 NOTE — Progress Notes (Signed)
  Subjective:    Patient ID: DICY SMIGEL, female    DOB: 1956-07-27, 55 y.o.   MRN: [id number]  HPI URI Symptoms Onset: 3d Description: N/V, cough. Modifying factors:  None  Symptoms Nasal discharge: YES Fever: NO Sore throat: NO Cough: yes, NP Wheezing: NO Ear pain: NO GI symptoms: N/V, now resolved, was NB/NB Sick contacts: Yes, child.  Red Flags  Stiff neck: NO Dyspnea: NO Rash: NO Swallowing difficulty: NO  Sinusitis Risk Factors Headache/face pain: NO Double sickening: NO tooth pain: NO  Allergy Risk Factors Sneezing: NO Itchy scratchy throat: NO Seasonal symptoms: NO  Flu Risk Factors Headache: NO muscle aches: NO severe fatigue: NO     Review of Systems    See HPI Objective:   Physical Exam  Constitutional: She appears well-developed and well-nourished. No distress.  HENT:  Head: Normocephalic and atraumatic.  Right Ear: External ear normal.  Left Ear: External ear normal.  Nose: Nose normal.  Mouth/Throat: Oropharynx is clear and moist.  Eyes: Pupils are equal, round, and reactive to light. Right eye exhibits no discharge. Left eye exhibits no discharge. No scleral icterus.  Neck: Normal range of motion. Neck supple. No JVD present. No tracheal deviation present.  Cardiovascular: Normal rate, regular rhythm and normal heart sounds.  Exam reveals no gallop and no friction rub.   No murmur heard. Pulmonary/Chest: Effort normal and breath sounds normal. No stridor. No respiratory distress. She has no wheezes. She has no rales. She exhibits no tenderness.  Lymphadenopathy:    She has no cervical adenopathy.  Skin: Skin is warm and dry.          Assessment & Plan:

## 2010-12-26 NOTE — Op Note (Signed)
Megan Khan, Megan Khan             ACCOUNT NO.:  [id number]   MEDICAL RECORD NO.:  [id number]          PATIENT TYPE:  AMB   LOCATION:  ENDO                         FACILITY:  [facility]   PHYSICIAN:  Graylin Shiver, M.D.   DATE OF BIRTH:  09-03-1955   DATE OF PROCEDURE:  12/21/2008  DATE OF DISCHARGE:                               OPERATIVE REPORT   PROCEDURE:  Colonoscopy with polypectomy   INDICATIONS FOR PROCEDURE:  Screening.   Informed consent was obtained after explanation of the risks of  bleeding, infection, and perforation.   PREMEDICATION:  Fentanyl 100 mcg IV and Versed 10 mg IV.   PROCEDURE IN DETAIL:  With the patient in the left lateral decubitus  position, a rectal exam was performed.  No masses were felt.  The Pentax  colonoscope was inserted into the rectum and advanced around the colon  to the cecum.  Cecal landmarks were identified.  The cecum and ascending  colon were normal.  The transverse colon was normal.  In the distal  descending colon, there was a 1.5 cm pedunculated polyp which was  removed by snare cautery technique.  The polyp was retrieved.  The  sigmoid and rectum looked normal.  Retroflexion was normal.  She  tolerated the procedure well without complications.   IMPRESSION:  Pedunculated polyp in the distal descending colon.           ______________________________  Graylin Shiver, M.D.     SFG/MEDQ  D:  12/21/2008  T:  12/22/2008  Job:  [id number]   cc:   Dala Dock

## 2011-01-05 ENCOUNTER — Ambulatory Visit (INDEPENDENT_AMBULATORY_CARE_PROVIDER_SITE_OTHER): Payer: Self-pay | Admitting: Family Medicine

## 2011-01-05 ENCOUNTER — Encounter: Payer: Self-pay | Admitting: Family Medicine

## 2011-01-05 VITALS — BP 128/90 | HR 54 | Temp 98.2°F | Ht 63.0 in | Wt 154.6 lb

## 2011-01-05 DIAGNOSIS — E119 Type 2 diabetes mellitus without complications: Secondary | ICD-10-CM

## 2011-01-05 DIAGNOSIS — B078 Other viral warts: Secondary | ICD-10-CM

## 2011-01-05 DIAGNOSIS — B079 Viral wart, unspecified: Secondary | ICD-10-CM

## 2011-01-08 DIAGNOSIS — B079 Viral wart, unspecified: Secondary | ICD-10-CM | POA: Insufficient documentation

## 2011-01-08 NOTE — Progress Notes (Signed)
  Subjective:    Patient ID: Megan Khan, female    DOB: [birth year], 55 y.o.   MRN: [id number]  HPI 1.  Patient here for removal of filiform warts scattered across chest and face.  Has had them removed by cryotherapy and what sounds like hyfrecation in past without complication.  Describes them as itching and painful when caught on her clothing.      Review of Systems Some itching and pain, no bleeding.      Objective:   Physical Exam Gen:  Alert, cooperative patient who appears stated age in no acute distress.  Vital signs reviewed. Skin:  Multiple filiform warts scattered across face and chest.  In particular, 1 about 5 mm in height Right eyebrow, 4 mm in height Right cheek, 4 mm height below Left pinna, 3 mm in height below Right pinna, 4 mm in height on Right chest.  Other Seborrheic keratoses scattered throughout, she wants filiform warts removed.         Assessment & Plan:

## 2011-01-08 NOTE — Assessment & Plan Note (Addendum)
Informed consent obtained and placed in chart, patient without questions.  1 mm Lidocaine without epi placed subQ beneath all 5 warts to be removed.  Used curet to remove warts, hyfrecator to cauterize any bleeding.  All bleeding minimal, Right eyebrow stopped with only direct pressure.  Patient satisfied with results, no complications.  5 warts in total removed.

## 2011-04-02 ENCOUNTER — Other Ambulatory Visit: Payer: Self-pay | Admitting: Family Medicine

## 2011-04-02 NOTE — Telephone Encounter (Signed)
Refill request

## 2011-04-24 ENCOUNTER — Telehealth: Payer: Self-pay | Admitting: Family Medicine

## 2011-04-24 ENCOUNTER — Other Ambulatory Visit: Payer: Self-pay | Admitting: Family Medicine

## 2011-04-24 NOTE — Telephone Encounter (Signed)
Megan Khan called to inquire about refills for her Metformin and Metoprolol.  She was told to contact [facility] on [address] and have them send the request electronically.  She called back and said they told her they couldn't send the request that way, they could only fax it.  So I told her to have them send it anyway.

## 2011-04-24 NOTE — Telephone Encounter (Signed)
Refill request

## 2011-09-28 ENCOUNTER — Other Ambulatory Visit: Payer: Self-pay | Admitting: Family Medicine

## 2011-09-28 MED ORDER — AMLODIPINE BESYLATE 10 MG PO TABS: 10.0000 mg | ORAL_TABLET | Freq: Every day | ORAL | Status: DC

## 2011-09-29 NOTE — Telephone Encounter (Signed)
Refill request

## 2011-10-01 NOTE — Telephone Encounter (Signed)
Refill request

## 2011-10-03 ENCOUNTER — Other Ambulatory Visit: Payer: Self-pay | Admitting: Internal Medicine

## 2011-10-03 ENCOUNTER — Encounter: Payer: Self-pay | Admitting: Family Medicine

## 2011-10-03 ENCOUNTER — Ambulatory Visit (INDEPENDENT_AMBULATORY_CARE_PROVIDER_SITE_OTHER): Payer: Self-pay | Admitting: Family Medicine

## 2011-10-03 ENCOUNTER — Other Ambulatory Visit: Payer: Self-pay | Admitting: Family Medicine

## 2011-10-03 VITALS — BP 122/98 | HR 86 | Temp 98.0°F | Ht 60.6 in | Wt 165.5 lb

## 2011-10-03 DIAGNOSIS — D126 Benign neoplasm of colon, unspecified: Secondary | ICD-10-CM | POA: Insufficient documentation

## 2011-10-03 DIAGNOSIS — Z23 Encounter for immunization: Secondary | ICD-10-CM

## 2011-10-03 DIAGNOSIS — L301 Dyshidrosis [pompholyx]: Secondary | ICD-10-CM | POA: Insufficient documentation

## 2011-10-03 DIAGNOSIS — I10 Essential (primary) hypertension: Secondary | ICD-10-CM

## 2011-10-03 DIAGNOSIS — Z1231 Encounter for screening mammogram for malignant neoplasm of breast: Secondary | ICD-10-CM

## 2011-10-03 DIAGNOSIS — E119 Type 2 diabetes mellitus without complications: Secondary | ICD-10-CM

## 2011-10-03 DIAGNOSIS — E785 Hyperlipidemia, unspecified: Secondary | ICD-10-CM

## 2011-10-03 LAB — COMPREHENSIVE METABOLIC PANEL
ALT: 16 U/L (ref 0–35)
CO2: 31 mEq/L (ref 19–32)
Calcium: 10.1 mg/dL (ref 8.4–10.5)
Chloride: 101 mEq/L (ref 96–112)
Potassium: 4.3 mEq/L (ref 3.5–5.3)
Sodium: 141 mEq/L (ref 135–145)
Total Protein: 7 g/dL (ref 6.0–8.3)

## 2011-10-03 LAB — POCT GLYCOSYLATED HEMOGLOBIN (HGB A1C): Hemoglobin A1C: 6.6

## 2011-10-03 LAB — CBC
Hemoglobin: 13.6 g/dL (ref 12.0–15.0)
MCH: 26.9 pg (ref 26.0–34.0)
MCV: 86.6 fL (ref 78.0–100.0)
RBC: 5.06 MIL/uL (ref 3.87–5.11)

## 2011-10-03 LAB — LIPID PANEL: Cholesterol: 194 mg/dL (ref 0–200)

## 2011-10-03 MED ORDER — AMLODIPINE BESYLATE 10 MG PO TABS: 10.0000 mg | ORAL_TABLET | Freq: Every day | ORAL | Status: DC

## 2011-10-03 MED ORDER — BETAMETHASONE DIPROPIONATE 0.05 % EX CREA: TOPICAL_CREAM | Freq: Two times a day (BID) | CUTANEOUS | Status: AC

## 2011-10-03 MED ORDER — METFORMIN HCL 1000 MG PO TABS: ORAL_TABLET | ORAL | Status: DC

## 2011-10-03 NOTE — Assessment & Plan Note (Signed)
A1C at goal. No changes to meds.  She is doing well.

## 2011-10-03 NOTE — Assessment & Plan Note (Signed)
Likely diagnosis.   Plan to treat with 2 weeks high dose corticosteroids, then 2 weeks of skin rest.

## 2011-10-03 NOTE — Patient Instructions (Addendum)
We will check some blood work today.   Use the steroid cream on your hands twice daily x 2 weeks and then let them rest for 2 weeks.   Put a warm compress on your eye, it should get better in a week or two.  Let me know what you want to do about the colonoscopy, whether you want to go back to [place] and pay out of pocket, try [facility] where they might be able to see you at reduced cost, or go to Dr. Loreta Ave which is $200 upfront.   It was good to see you today.

## 2011-10-03 NOTE — Progress Notes (Signed)
  Subjective:    Patient ID: Megan Khan, female    DOB: [birth year], 56 y.o.   MRN: [id number]  HPI Diabetes:  Currently on Metformin.  No adverse effects from medication.  No hypoglycemic events.  No paresthesia or peripheral nerve pain.  Measures blood sugars at home every:     Lab Results  Component Value Date   HGBA1C 6.6 10/03/2011   Hypertension:  Long-term problem for this patient.  No adverse effects from medication.  Not checking it regularly.  No HA, CP, dizziness, shortness of breath, palpitations, or LE swelling.   BP Readings from Last 3 Encounters:  01/05/11 128/90  12/11/10 121/85  09/22/10 137/85   Rash on hands:  Present for about a month.  Worsening.  Palms itch.  No drainage.  Worse when washing dishes or cooking with salty foods.  No fevers or chills.   Sty:  Present for 4 -5 days.  Some itching.  No drainage.  No recent illnesses, nasal congestion, nasal drainage.  The following portions of the patient's history were reviewed and updated as appropriate: allergies, current medications, past medical history, family and social history, and problem list, including fact she is nonsmoker.     Review of Systems See HPI above for review of systems.       Objective:   Physical Exam Gen:  Alert, cooperative patient who appears stated age in no acute distress.  Vital signs reviewed. HEENT:  [place]/AT.  EOMI, PERRL.  MMM, tonsils non-erythematous, non-edematous.  External ears WNL, Bilateral TM's normal without retraction, redness or bulging.  Neck: No masses or thyromegaly or limitation in range of motion.  No cervical lymphadenopathy. Pulm:  Clear to auscultation bilaterally with good air movement.  No wheezes or rales noted.   Cardiac:  Regular rate and rhythm without murmur auscultated.  Good S1/S2. Abd:  Soft/nondistended/nontender.  Good bowel sounds throughout all four quadrants.  No masses noted.  Ext:  No clubbing/cyanosis/erythema.  No edema noted bilateral lower  extremities.   Neuro:  Grossly normal, no gait abnormalities Psych:  Not depressed or anxious appearing.  Conversant and engaged Skin:  Dry scaly patches noted on palms, extending to wrist.  Some few vesicles present as well.  Excoriations noted.         Assessment & Plan:

## 2011-10-03 NOTE — Assessment & Plan Note (Addendum)
Refer back to [facility] where he had colonoscopy in past.   Concern is they are not accepting [employer]. Patient states that last time she was seen there she did not have to pay her bill.   Will attempt going back to see Dr. Evette Cristal who performed previous colonscopy in 2010, if payment becomes an issue will attempt [facility] or Dr. Loreta Ave who charges $200 copay.

## 2011-10-03 NOTE — Assessment & Plan Note (Signed)
Lipid profile today 

## 2011-10-03 NOTE — Assessment & Plan Note (Signed)
Diastolic high today.   Patient with recent move (this week) states this has caused her much stress. Otherwise has been at goal previous visits. No med changes today, FU in 2-3 months.

## 2011-10-04 ENCOUNTER — Encounter: Payer: Self-pay | Admitting: Family Medicine

## 2011-10-12 ENCOUNTER — Ambulatory Visit: Payer: Self-pay | Admitting: Family Medicine

## 2011-10-22 ENCOUNTER — Other Ambulatory Visit (HOSPITAL_COMMUNITY)
Admission: RE | Admit: 2011-10-22 | Discharge: 2011-10-22 | Disposition: A | Discharge status: Home / Self Care | Payer: Self-pay | Source: Ambulatory Visit | Attending: Family Medicine | Admitting: Family Medicine

## 2011-10-22 ENCOUNTER — Ambulatory Visit (INDEPENDENT_AMBULATORY_CARE_PROVIDER_SITE_OTHER): Payer: Self-pay | Admitting: Family Medicine

## 2011-10-22 VITALS — BP 134/90 | HR 63 | Ht 61.0 in | Wt 160.0 lb

## 2011-10-22 DIAGNOSIS — J309 Allergic rhinitis, unspecified: Secondary | ICD-10-CM

## 2011-10-22 DIAGNOSIS — Z01419 Encounter for gynecological examination (general) (routine) without abnormal findings: Secondary | ICD-10-CM | POA: Insufficient documentation

## 2011-10-22 DIAGNOSIS — Z126 Encounter for screening for malignant neoplasm of bladder: Secondary | ICD-10-CM

## 2011-10-22 NOTE — Patient Instructions (Signed)
Try either Zyrtec, Allegra, or Claritin for your congestion and sore throat.   We'll let you know the Pap smear results.   You do not have an infection.

## 2011-10-22 NOTE — Progress Notes (Signed)
  Subjective:    Patient ID: Megan Khan, female    DOB: 04-25-1956, 56 y.o.   MRN: [id number]  HPI Patient here for complete physical exam including Pap smear. She also has complaints as below:  One. Sore throat: Present for past 2 weeks. Intermittent. Describes raw scratchy feeling in back of throat. She moved into a new house about one month ago and symptoms started roughly about that time. Also some nasal congestion please see below. She is taking Chloraseptic Spray or lozenges with relief.  #2. Nasal congestion: As above presents since the new house. She describes nasal congestion that is intermittent. No real drainage. Does have some postnasal drip. No fevers or chills. No cough. She has not tried anything for relief.   Review of Systems The patient denies fever, unusual weight change, decreased hearing, chest pain, palpitations, pre-syncopal or syncopal episodes, dyspnea on exertion, prolonged cough, hemoptysis, change in bowel habits, melena, hematochezia, severe indigestion/heartburn, nausea/vomiting/abdominal pain, genital sores, muscle weakness, difficulty walking, abnormal bleeding, or enlarged lymph nodes.       Objective:   Physical Exam  Gen:  Alert, cooperative patient who appears stated age in no acute distress.  Vital signs reviewed. HEENT:  [place]/AT.  EOMI, PERRL. nasal turbinates edematous and boggy appearing bilaterally. MMM, tonsils non-erythematous, non-edematous.  External ears WNL, Bilateral TM's normal without retraction, redness or bulging.  Neck: No masses or thyromegaly or limitation in range of motion.  No cervical lymphadenopathy. Pulm:  Clear to auscultation bilaterally with good air movement.  No wheezes or rales noted.   Cardiac:  Regular rate and rhythm without murmur auscultated.  Good S1/S2. Abd:  Soft/nondistended/nontender.  Good bowel sounds throughout all four quadrants.  No masses noted.  GYN:  External genitalia within normal limits.  Vaginal  mucosa pink, moist, normal rugae.  Nonfriable cervix without lesions, no discharge or bleeding noted on speculum exam.  Bimanual exam revealed normal, nongravid uterus.  No cervical motion tenderness. No adnexal masses bilaterally.  Pap smear obtained. Breast: Normal breast tissue bilaterally. No axillary lymphadenopathy noted. No masses, lumps, bumps. Unable to express any fluid from nipple Ext:  No clubbing/cyanosis/erythema.  No edema noted bilateral lower extremities.   Neuro:  Grossly normal, no gait abnormalities Psych:  Not depressed or anxious appearing.  Conversant and engaged       Assessment & Plan:

## 2011-10-22 NOTE — Assessment & Plan Note (Signed)
Likely environmental. Conservative therapy for now with over-the-counter second generation antihistamine. Provided instructions regarding this. Sore throat likely related to postnasal drip secondary to this as well.

## 2011-11-01 ENCOUNTER — Other Ambulatory Visit: Payer: Self-pay | Admitting: *Deleted

## 2011-11-01 MED ORDER — LISINOPRIL-HYDROCHLOROTHIAZIDE 20-25 MG PO TABS: 1.0000 | ORAL_TABLET | Freq: Every day | ORAL | Status: DC

## 2011-11-13 ENCOUNTER — Ambulatory Visit: Payer: Self-pay

## 2011-11-19 ENCOUNTER — Ambulatory Visit
Admission: RE | Admit: 2011-11-19 | Discharge: 2011-11-19 | Disposition: A | Discharge status: Home / Self Care | Payer: Self-pay | Source: Ambulatory Visit | Attending: Family Medicine | Admitting: Family Medicine

## 2011-11-19 DIAGNOSIS — Z1231 Encounter for screening mammogram for malignant neoplasm of breast: Secondary | ICD-10-CM

## 2011-12-18 ENCOUNTER — Other Ambulatory Visit: Payer: Self-pay | Admitting: Family Medicine

## 2011-12-18 DIAGNOSIS — I10 Essential (primary) hypertension: Secondary | ICD-10-CM

## 2011-12-18 MED ORDER — LISINOPRIL-HYDROCHLOROTHIAZIDE 20-25 MG PO TABS: 1.0000 | ORAL_TABLET | Freq: Every day | ORAL | Status: DC

## 2011-12-18 MED ORDER — METOPROLOL TARTRATE 50 MG PO TABS: 50.0000 mg | ORAL_TABLET | Freq: Two times a day (BID) | ORAL | Status: DC

## 2011-12-18 MED ORDER — METFORMIN HCL 1000 MG PO TABS: ORAL_TABLET | ORAL | Status: DC

## 2011-12-18 NOTE — Telephone Encounter (Signed)
Patient is calling for a refill on her Metformin to go to [facility] on [address].  She said that the pharmacy has said they have sent the refill request which is not in Dr. Westly Pam box.

## 2011-12-18 NOTE — Telephone Encounter (Signed)
Appointment scheduled for 06/03 with PCP . Enough medication given to last for one month.  Patient also wants refill on metoprolol and lisinopril /HCTZ

## 2012-01-14 ENCOUNTER — Encounter: Payer: Self-pay | Admitting: Family Medicine

## 2012-01-14 ENCOUNTER — Ambulatory Visit (INDEPENDENT_AMBULATORY_CARE_PROVIDER_SITE_OTHER): Payer: Self-pay | Admitting: Family Medicine

## 2012-01-14 VITALS — BP 123/86 | HR 56 | Temp 97.9°F | Ht 61.0 in | Wt 162.0 lb

## 2012-01-14 DIAGNOSIS — E119 Type 2 diabetes mellitus without complications: Secondary | ICD-10-CM

## 2012-01-14 DIAGNOSIS — I10 Essential (primary) hypertension: Secondary | ICD-10-CM

## 2012-01-14 LAB — POCT GLYCOSYLATED HEMOGLOBIN (HGB A1C): Hemoglobin A1C: 6.9

## 2012-01-14 MED ORDER — METFORMIN HCL 1000 MG PO TABS: ORAL_TABLET | ORAL | Status: DC

## 2012-01-14 MED ORDER — LISINOPRIL-HYDROCHLOROTHIAZIDE 20-25 MG PO TABS: 1.0000 | ORAL_TABLET | Freq: Every day | ORAL | Status: DC

## 2012-01-14 MED ORDER — GLUCOSE BLOOD VI STRP: ORAL_STRIP | Status: DC

## 2012-01-14 MED ORDER — METOPROLOL TARTRATE 50 MG PO TABS: 50.0000 mg | ORAL_TABLET | Freq: Two times a day (BID) | ORAL | Status: DC

## 2012-01-14 NOTE — Patient Instructions (Addendum)
Keep taking your Metformin as prescribed for your diabetes.   Your A1C was 6.9 today.  This is still at your goal.   See if the health dept has the Simvastatin yet.    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  I'm not sure who I will be keeping yet.  I've enjoyed taking care of you!

## 2012-01-14 NOTE — Progress Notes (Signed)
  Subjective:    Patient ID: CAMIYA VINAL, female    DOB: Feb 15, 1956, 56 y.o.   MRN: [id number]  HPI Ms Piasecki presents today with no real complaints.  She is doing well.   1.   Diabetes:  Currently on     No adverse effects from medication.  No hypoglycemic events.  No paresthesia or peripheral nerve pain.  Measures blood sugars at home every:     Lab Results  Component Value Date   HGBA1C 6.9 01/14/2012   Medication refill:  Patient desires refill of majority of her medications.      Review of Systems See HPI above for review of systems.      Objective:   Physical Exam  Gen:  Alert, cooperative patient who appears stated age in no acute distress.  Vital signs reviewed. Cardiac:  Regular rate and rhythm without murmur auscultated.  Good S1/S2. Pulm:  Clear to auscultation bilaterally with good air movement.  No wheezes or rales noted.   Ext:  No clubbing/cyanosis/erythema.  No edema noted bilateral lower extremities.         Assessment & Plan:

## 2012-01-14 NOTE — Assessment & Plan Note (Signed)
Continue current medication regimen.   She may need to increase to BID 1000 mg if her A1C continues to creep up.  FU in 3 months.

## 2012-01-22 ENCOUNTER — Encounter (HOSPITAL_COMMUNITY)
Admission: RE | Disposition: A | Discharge status: Home / Self Care | Payer: Self-pay | Source: Ambulatory Visit | Attending: Gastroenterology

## 2012-01-22 ENCOUNTER — Ambulatory Visit (HOSPITAL_COMMUNITY)
Admission: RE | Admit: 2012-01-22 | Discharge: 2012-01-22 | Disposition: A | Discharge status: Home / Self Care | Payer: Self-pay | Source: Ambulatory Visit | Attending: Gastroenterology | Admitting: Gastroenterology

## 2012-01-22 ENCOUNTER — Encounter (HOSPITAL_COMMUNITY): Payer: Self-pay | Admitting: *Deleted

## 2012-01-22 DIAGNOSIS — I10 Essential (primary) hypertension: Secondary | ICD-10-CM | POA: Insufficient documentation

## 2012-01-22 DIAGNOSIS — D126 Benign neoplasm of colon, unspecified: Secondary | ICD-10-CM | POA: Insufficient documentation

## 2012-01-22 DIAGNOSIS — Z7982 Long term (current) use of aspirin: Secondary | ICD-10-CM | POA: Insufficient documentation

## 2012-01-22 DIAGNOSIS — Z87891 Personal history of nicotine dependence: Secondary | ICD-10-CM | POA: Insufficient documentation

## 2012-01-22 DIAGNOSIS — Z79899 Other long term (current) drug therapy: Secondary | ICD-10-CM | POA: Insufficient documentation

## 2012-01-22 DIAGNOSIS — E119 Type 2 diabetes mellitus without complications: Secondary | ICD-10-CM | POA: Insufficient documentation

## 2012-01-22 DIAGNOSIS — Z09 Encounter for follow-up examination after completed treatment for conditions other than malignant neoplasm: Secondary | ICD-10-CM | POA: Insufficient documentation

## 2012-01-22 HISTORY — DX: Personal history of colon polyps, unspecified: Z86.0100

## 2012-01-22 HISTORY — DX: Hyperlipidemia, unspecified: E78.5

## 2012-01-22 HISTORY — DX: Personal history of colonic polyps: Z86.010

## 2012-01-22 HISTORY — PX: COLONOSCOPY: SHX5424

## 2012-01-22 HISTORY — DX: Essential (primary) hypertension: I10

## 2012-01-22 SURGERY — COLONOSCOPY
Anesthesia: Moderate Sedation

## 2012-01-22 MED ORDER — MIDAZOLAM HCL 10 MG/2ML IJ SOLN
INTRAMUSCULAR | Status: AC
  Filled 2012-01-22: qty 4

## 2012-01-22 MED ORDER — SODIUM CHLORIDE 0.9 % IV SOLN
Freq: Once | INTRAVENOUS | Status: AC
  Administered 2012-01-22: 500 mL via INTRAVENOUS

## 2012-01-22 MED ORDER — FENTANYL CITRATE 0.05 MG/ML IJ SOLN
INTRAMUSCULAR | Status: DC | PRN
  Administered 2012-01-22 (×4): 25 ug via INTRAVENOUS

## 2012-01-22 MED ORDER — MIDAZOLAM HCL 10 MG/2ML IJ SOLN
INTRAMUSCULAR | Status: DC | PRN
  Administered 2012-01-22: 1 mg via INTRAVENOUS
  Administered 2012-01-22: 2 mg via INTRAVENOUS
  Administered 2012-01-22: 1 mg via INTRAVENOUS
  Administered 2012-01-22: 2 mg via INTRAVENOUS

## 2012-01-22 MED ORDER — DIPHENHYDRAMINE HCL 50 MG/ML IJ SOLN
INTRAMUSCULAR | Status: AC
  Filled 2012-01-22: qty 1

## 2012-01-22 MED ORDER — FENTANYL CITRATE 0.05 MG/ML IJ SOLN
INTRAMUSCULAR | Status: AC
  Filled 2012-01-22: qty 4

## 2012-01-22 NOTE — H&P (Signed)
History: The patient is a 56 year old female who presents to the hospital for outpatient colonoscopy due to a history of colon polyps. Her last colonoscopy was 3 years ago. She has no specific complaints.  Past medical history:: Diabetes mellitus, hypertension, anemia  Surgical history: Tubal ligation, colonoscopy  Current medications metoprolol tartrate, 81 mg aspirin, metformin, lisinopril-hydrochlorothiazide, fish oil, vitamin D, amlodipine besylate, Cenestin  Social history she used to smoke but does not anymore, denies alcohol  Allergies sulfa drugs  Systems review: No chest pain or shortness of breath  Physical exam: She is alert and oriented and in no acute distress  Skin nonicteric  Heart regular rhythm no murmurs  Lungs clear  Abdomen reveals normal bowel sounds, it is soft and nontender  Impression: 56 year old female with history of colon polyps here for surveillance outpatient colonoscopy  Plan: Colonoscopy

## 2012-01-22 NOTE — Op Note (Signed)
[facility] [address] [place], [place]  [address]  COLONOSCOPY PROCEDURE REPORT  PATIENT:  Megan Khan, Megan Khan  MR#:  [id number] BIRTHDATE:  01-Feb-1956, 56 yrs. old  GENDER:  female ENDOSCOPIST:  Wandalee Ferdinand, MD REF. BY: PROCEDURE DATE:  01/22/2012 PROCEDURE:  Colonoscopy with polypectomy ASA CLASS: 2 INDICATIONS: History of colon polyps MEDICATIONS:  Fentanyl 100 mcg IV Versed 6 mg IV  DESCRIPTION OF PROCEDURE:   After the risks benefits and alternatives of the procedure were thoroughly explained, informed consent was obtained.  The Pentax Peds Colonoscope [id number] endoscope was introduced through the anus and advanced to the cecum , without limitations.  The quality of the prep was good . The instrument was then slowly withdrawn as the colon was fully examined. <<PROCEDUREIMAGES>>  FINDINGS: The cecum and ascending colon were normal. There was an 8 mm sessile polyp in the transverse colon removed by snare cautery technique. The descending colon and sigmoid looked normal. A 3 mm polyp was removed from the rectum by cold snare technique but the polyp was not retrieved.  COMPLICATIONS: None  IMPRESSION: Colon polyps as seen above  RECOMMENDATIONS: Check pathology. Repeat colonoscopy in 5 years  ______________________________ Wandalee Ferdinand, MD  CC:  n. eSIGNED:   Sam Oden Lindaman at 01/22/2012 10:44 AM  Rometta Emery, [phone]

## 2012-01-22 NOTE — Discharge Instructions (Addendum)
Hold aspirin for 1 week.    Endoscopy  Care After  Please read the instructions outlined below and refer to this sheet in the next few weeks. These discharge instructions provide you with general information on caring for yourself after you leave the hospital. Your doctor may also give you specific instructions. While your treatment has been planned according to the most current medical practices available, unavoidable complications occasionally occur. If you have any problems or questions after discharge, please call your doctor. HOME CARE INSTRUCTIONS Activity  You may resume your regular activity but move at a slower pace for the next 24 hours.   Take frequent rest periods for the next 24 hours.   Walking will help expel (get rid of) the air and reduce the bloated feeling in your abdomen.   No driving for 24 hours (because of the anesthesia (medicine) used during the test).   You may shower.   Do not sign any important legal documents or operate any machinery for 24 hours (because of the anesthesia used during the test).  Nutrition  Drink plenty of fluids.   You may resume your normal diet.   Begin with a light meal and progress to your normal diet.   Avoid alcoholic beverages for 24 hours or as instructed by your caregiver.  Medications You may resume your normal medications unless your caregiver tells you otherwise. What you can expect today  You may experience abdominal discomfort such as a feeling of fullness or "gas" pains.   You may experience a sore throat for 2 to 3 days. This is normal. Gargling with salt water may help this.  Follow-up Your doctor will discuss the results of your test with you. SEEK IMMEDIATE MEDICAL CARE IF:  You have excessive nausea (feeling sick to your stomach) and/or vomiting.   You have severe abdominal pain and distention (swelling).   You have trouble swallowing.   You have a temperature over 100 F (37.8 C).   You have rectal  bleeding or vomiting of blood.  Document Released: 03/13/2004 Document Revised: 07/19/2011 Document Reviewed: 09/24/2007 [employer] Patient Infor mation 2012 [employer].colon polyp   Colon Polyps  A polyp is extra tissue that grows inside your body. Colon polyps grow in the large intestine. The large intestine, also called the colon, is part of your digestive system. It is a long, hollow tube at the end of your digestive tract where your body makes and stores stool. Most polyps are not dangerous. They are benign. This means they are not cancerous. But over time, some types of polyps can turn into cancer. Polyps that are smaller than a pea are usually not harmful. But larger polyps could someday become or may already be cancerous. To be safe, doctors remove all polyps and test them.  WHO GETS POLYPS? Anyone can get polyps, but certain people are more likely than others. You may have a greater chance of getting polyps if:  You are over 50.   You have had polyps before.   Someone in your family has had polyps.   Someone in your family has had cancer of the large intestine.   Find out if someone in your family has had polyps. You may also be more likely to get polyps if you:   Eat a lot of fatty foods.   Smoke.   Drink alcohol.   Do not exercise.   Eat too much.  SYMPTOMS  Most small polyps do not cause symptoms. People often do  not know they have one until their caregiver finds it during a regular checkup or while testing them for something else. Some people do have symptoms like these:  Bleeding from the anus. You might notice blood on your underwear or on toilet paper after you have had a bowel movement.   Constipation or diarrhea that lasts more than a week.   Blood in the stool. Blood can make stool look black or it can show up as red streaks in the stool.  If you have any of these symptoms, see your caregiver. HOW DOES THE DOCTOR TEST FOR POLYPS? The doctor can use  four tests to check for polyps:  Digital rectal exam. The caregiver wears gloves and checks your rectum (the last part of the large intestine) to see if it feels normal. This test would find polyps only in the rectum. Your caregiver may need to do one of the other tests listed below to find polyps higher up in the intestine.   Barium enema. The caregiver puts a liquid called barium into your rectum before taking x-rays of your large intestine. Barium makes your intestine look white in the pictures. Polyps are dark, so they are easy to see.   Sigmoidoscopy. With this test, the caregiver can see inside your large intestine. A thin flexible tube is placed into your rectum. The device is called a sigmoidoscope, which has a light and a tiny video camera in it. The caregiver uses the sigmoidoscope to look at the last third of your large intestine.   Colonoscopy. This test is like sigmoidoscopy, but the caregiver looks at all of the large intestine. It usually requires sedation. This is the most common method for finding and removing polyps.  TREATMENT   The caregiver will remove the polyp during sigmoidoscopy or colonoscopy. The polyp is then tested for cancer.   If you have had polyps, your caregiver may want you to get tested regularly in the future.  PREVENTION  There is not one sure way to prevent polyps. You might be able to lower your risk of getting them if you:  Eat more fruits and vegetables and less fatty food.   Do not smoke.   Avoid alcohol.   Exercise every day.   Lose weight if you are overweight.   Eating more calcium and folate can also lower your risk of getting polyps. Some foods that are rich in calcium are milk, cheese, and broccoli. Some foods that are rich in folate are chickpeas, kidney beans, and spinach.   Aspirin might help prevent polyps. Studies are under way.  Document Released: 04/25/2004 Document Revised: 07/19/2011 Document Reviewed: 10/01/2007 [facility]  Patient Information 2012 [place], [place].

## 2012-01-23 ENCOUNTER — Encounter (HOSPITAL_COMMUNITY): Payer: Self-pay | Admitting: Gastroenterology

## 2012-04-21 ENCOUNTER — Encounter: Payer: Self-pay | Admitting: Family Medicine

## 2012-04-21 ENCOUNTER — Ambulatory Visit (INDEPENDENT_AMBULATORY_CARE_PROVIDER_SITE_OTHER): Payer: Self-pay | Admitting: Family Medicine

## 2012-04-21 VITALS — BP 128/82 | HR 69 | Temp 97.9°F | Ht 61.0 in | Wt 162.9 lb

## 2012-04-21 DIAGNOSIS — J069 Acute upper respiratory infection, unspecified: Secondary | ICD-10-CM

## 2012-04-21 NOTE — Patient Instructions (Addendum)
Try the decongestants for relief. Nasal saline spray is also helpful to clear out your sinuses.   It was good to see you again!

## 2012-04-21 NOTE — Progress Notes (Signed)
  Subjective:    Patient ID: Megan Khan, female    DOB: 04/08/1956, 56 y.o.   MRN: [id number]  HPI  1.  URI symptoms:  Present for past 5 days.  Describes rhinorrhea, sinus congestion.  Has had some improvement since weekend but now with cough and therefore she presents to care today.  Sinus drainage is clear.  Has tried Emergen-C and hot showers with intermittent relief.  Sick contacts are none.  No chills but subjective fevers. No nausea or vomiting.     Review of Systems See HPI above for review of systems.       Objective:   Physical Exam BP 128/82  Pulse 69  Temp 97.9 F (36.6 C) (Oral)  Ht 5\' 1"  (1.549 m)  Wt 162 lb 14.4 oz (73.891 kg)  BMI 30.78 kg/m2 Gen:  Patient sitting on exam table, appears stated age in no acute distress Head: Normocephalic atraumatic Eyes: EOMI, PERRL, sclera and conjunctiva non-erythematous Nose:  Nasal turbinates grossly enlarged bilaterally. Some exudates noted. Tender to palpation of maxillary sinus  Mouth: Mucosa membranes moist. Tonsils +2, nonenlarged, non-erythematous. Ears:  TMs and ear canals clear BL Neck: No cervical lymphadenopathy noted Heart:  RRR, no murmurs auscultated. Pulm:  Clear to auscultation bilaterally with good air movement.  No wheezes or rales noted.          Assessment & Plan:

## 2012-04-21 NOTE — Assessment & Plan Note (Addendum)
Likely viral illness based on symptoms and history.  No signs of bacterial illness. Symptomatic treatment for now, return if worsening or no improvement in 1 week. Does have some evidence of increased nasal drainage and erythema, recommend nasal saline.  She does not like nasal sprays so will hold off on this.  Not interested.  Also OTC decongestants for relief.

## 2012-05-12 ENCOUNTER — Ambulatory Visit (INDEPENDENT_AMBULATORY_CARE_PROVIDER_SITE_OTHER): Payer: Self-pay | Admitting: *Deleted

## 2012-05-12 DIAGNOSIS — Z23 Encounter for immunization: Secondary | ICD-10-CM

## 2012-06-20 ENCOUNTER — Ambulatory Visit (INDEPENDENT_AMBULATORY_CARE_PROVIDER_SITE_OTHER): Payer: Self-pay | Admitting: *Deleted

## 2012-06-20 DIAGNOSIS — Z111 Encounter for screening for respiratory tuberculosis: Secondary | ICD-10-CM

## 2012-06-23 ENCOUNTER — Ambulatory Visit (INDEPENDENT_AMBULATORY_CARE_PROVIDER_SITE_OTHER): Payer: Self-pay | Admitting: *Deleted

## 2012-06-23 DIAGNOSIS — Z111 Encounter for screening for respiratory tuberculosis: Secondary | ICD-10-CM

## 2012-06-27 ENCOUNTER — Encounter: Payer: Self-pay | Admitting: Family Medicine

## 2012-06-27 ENCOUNTER — Ambulatory Visit (INDEPENDENT_AMBULATORY_CARE_PROVIDER_SITE_OTHER): Payer: Self-pay | Admitting: Family Medicine

## 2012-06-27 VITALS — BP 126/83 | HR 66 | Temp 97.6°F | Ht 61.5 in | Wt 164.7 lb

## 2012-06-27 DIAGNOSIS — E785 Hyperlipidemia, unspecified: Secondary | ICD-10-CM

## 2012-06-27 DIAGNOSIS — E119 Type 2 diabetes mellitus without complications: Secondary | ICD-10-CM

## 2012-06-27 DIAGNOSIS — H18899 Other specified disorders of cornea, unspecified eye: Secondary | ICD-10-CM | POA: Insufficient documentation

## 2012-06-27 DIAGNOSIS — I10 Essential (primary) hypertension: Secondary | ICD-10-CM

## 2012-06-27 MED ORDER — AMLODIPINE BESYLATE 10 MG PO TABS: 10.0000 mg | ORAL_TABLET | Freq: Every day | ORAL | Status: DC

## 2012-06-27 MED ORDER — LISINOPRIL-HYDROCHLOROTHIAZIDE 20-25 MG PO TABS: 1.0000 | ORAL_TABLET | Freq: Every day | ORAL | Status: DC

## 2012-06-27 MED ORDER — DICLOFENAC SODIUM 0.1 % OP SOLN: 1.0000 [drp] | Freq: Three times a day (TID) | OPHTHALMIC | Status: DC

## 2012-06-27 NOTE — Assessment & Plan Note (Addendum)
Most likely results of constant itching.  Flurocein eye drops and black light did not show evidence of foreign body, and no blurred vision, or diplopia present.  Will go ahead with Diclofenac eye drops tid for 5-7 days.  If no improvement, to call back at this time.

## 2012-06-27 NOTE — Progress Notes (Signed)
Megan Khan is a 56 y.o. who presents today for DM and HTN f/u  Pt well controlled on metformin 500 mg bid.  No adverse medicine reactions.  No hypoglycemic events, no blurred vision or tingling numbness or loss of sensation lower extremities.  A1C 3 months ago 7 and today is 6.5.  HTH well controlled on Lopressor 50 mg bid, Norvasc 10 mg qd, and Zestoretic 20-25 qd.  No ADR on medications including edema, and no palpitations blurred vision, dizziness.  Well controlled to 126/83 today.   Also c/o itching in her eye, sensation of foreign body on the left eye with no known inciting event. Has been going on for one month, no blurred vision, diplopia, scotoma, eye shadow or curtain falling over eye.  Has tried visine eye drops with no help.     Past Medical History  Diagnosis Date  . Hypertension   . Diabetes mellitus   . Hyperlipidemia   . Hx of colonic polyps   . H/O colonoscopy      Social History Main Topics  . Smoking status: Former Smoker    Types: Cigarettes    Quit date: 09/23/1995      Current Outpatient Prescriptions on File Prior to Visit  Medication Sig Dispense Refill  . aspirin 81 MG EC tablet Take 81 mg by mouth daily.        . betamethasone dipropionate (DIPROLENE) 0.05 % cream Apply topically 2 (two) times daily.  45 g  0  . fish oil-omega-3 fatty acids 1000 MG capsule Take 2 g by mouth daily.      Marland Kitchen glucose blood test strip Use as instructed - please provide strips appropriate for her glucometer  100 each  12  . Homeopathic Products ([facility] PINK EYE) SOLN Place 1-2 drops into both eyes every 4 (four) hours.        . metFORMIN (GLUCOPHAGE) 1000 MG tablet TAKE ONE-HALF TABLET BY MOUTH EVERY DAY IN THE MORNING AND ONE TABLET EVERY DAY IN THE EVENING  180 tablet  1  . metoprolol (LOPRESSOR) 50 MG tablet Take 1 tablet (50 mg total) by mouth 2 (two) times daily.  180 tablet  2  . Multiple Vitamins-Minerals (WOMENS MULTIVITAMIN PLUS) TABS Take 1 tablet by mouth daily.         . [DISCONTINUED] amLODipine (NORVASC) 10 MG tablet Take 1 tablet (10 mg total) by mouth daily.  90 tablet  3  . [DISCONTINUED] lisinopril-hydrochlorothiazide (PRINZIDE,ZESTORETIC) 20-25 MG per tablet Take 1 tablet by mouth daily.  90 tablet  1    Physical Exam Filed Vitals:   06/27/12 1113  BP: 126/83  Pulse: 66  Temp: 97.6 F (36.4 C)    Gen: NAD HEENT: +PERRLA, + EOMI B/L, negative scratches on cornea on L, no evidence of foreign body.  Cardio: RRR, No murmurs appreciated  Lungs: CTA    Lab Results  Component Value Date   HGBA1C 6.5 06/27/2012

## 2012-06-27 NOTE — Assessment & Plan Note (Signed)
Well controlled on Metformin 500 mg bid.  A1C to 6.1 today so will check again in 3 months.  Otherwise no changes.

## 2012-06-27 NOTE — Assessment & Plan Note (Signed)
Well controlled.  Continue Zestoretic and Amlodipine as directed.  Consider BMP at three month check as has not had that done in one year.

## 2012-06-27 NOTE — Assessment & Plan Note (Signed)
Will get Lipid Profile at next visit.  Has not been taking the zocor in several months due to cost.  Will start Pravachol 40 due to $4 plan if elevated LDL.

## 2012-06-27 NOTE — Patient Instructions (Signed)
Megan Khan, It was nice seeing you today.  You are doing well with your diabetes, continue the great work.  Also with your blood pressure, continue the great work.  You most likely have some irritation in your eye.  We will try the Diclofenac eye drops, three times per day, for the next 7 days.  If no improvement, please call and let us know.  Thanks, Dr. Paulina Fusi   Blood Sugar Monitoring, Adult GLUCOSE METERS FOR SELF-MONITORING OF BLOOD GLUCOSE  It is important to be able to correctly measure your blood sugar (glucose). You can use a blood glucose monitor (a small battery-operated device) to check your glucose level at any time. This allows you and your caregiver to monitor your diabetes and to determine how well your treatment plan is working. The process of monitoring your blood glucose with a glucose meter is called self-monitoring of blood glucose (SMBG). When people with diabetes control their blood sugar, they have better health. To test for glucose with a typical glucose meter, place the disposable strip in the meter. Then place a small sample of blood on the "test strip." The test strip is coated with chemicals that combine with glucose in blood. The meter measures how much glucose is present. The meter displays the glucose level as a number. Several new models can record and store a number of test results. Some models can connect to personal computers to store test results or print them out.  Newer meters are often easier to use than older models. Some meters allow you to get blood from places other than your fingertip. Some new models have automatic timing, error codes, signals, or barcode readers to help with proper adjustment (calibration). Some meters have a large display screen or spoken instructions for people with visual impairments.  INSTRUCTIONS FOR USING GLUCOSE METERS  Wash your hands with soap and warm water, or clean the area with alcohol. Dry your hands completely.  Prick the side of  your fingertip with a lancet (a sharp-pointed tool used by hand).  Hold the hand down and gently milk the finger until a small drop of blood appears. Catch the blood with the test strip.  Follow the instructions for inserting the test strip and using the SMBG meter. Most meters require the meter to be turned on and the test strip to be inserted before applying the blood sample.  Record the test result.  Read the instructions carefully for both the meter and the test strips that go with it. Meter instructions are found in the user manual. Keep this manual to help you solve any problems that may arise. Many meters use "error codes" when there is a problem with the meter, the test strip, or the blood sample on the strip. You will need the manual to understand these error codes and fix the problem.  New devices are available such as laser lancets and meters that can test blood taken from "alternative sites" of the body, other than fingertips. However, you should use standard fingertip testing if your glucose changes rapidly. Also, use standard testing if:  You have eaten, exercised, or taken insulin in the past 2 hours.  You think your glucose is low.  You tend to not feel symptoms of low blood glucose (hypoglycemia).  You are ill or under stress.  Clean the meter as directed by the manufacturer.  Test the meter for accuracy as directed by the manufacturer.  Take your meter with you to your caregiver's office. This way,  you can test your glucose in front of your caregiver to make sure you are using the meter correctly. Your caregiver can also take a sample of blood to test using a routine lab method. If values on the glucose meter are close to the lab results, you and your caregiver will see that your meter is working well and you are using good technique. Your caregiver will advise you about what to do if the results do not match. FREQUENCY OF TESTING  Your caregiver will tell you how often  you should check your blood glucose. This will depend on your type of diabetes, your current level of diabetes control, and your types of medicines. The following are general guidelines, but your care plan may be different. Record all your readings and the time of day you took them for review with your caregiver.   Diabetes type 1.  When you are using insulin with good diabetic control (either multiple daily injections or via a pump), you should check your glucose 4 times a day.  If your diabetes is not well controlled, you may need to monitor more frequently, including before meals and 2 hours after meals, at bedtime, and occasionally between 2 a.m. and 3 a.m.  You should always check your glucose before a dose of insulin or before changing the rate on your insulin pump.  Diabetes type 2.  Guidelines for SMBG in diabetes type 2 are not as well defined.  If you are on insulin, follow the guidelines above.  If you are on medicines, but not insulin, and your glucose is not well controlled, you should test at least twice daily.  If you are not on insulin, and your diabetes is controlled with medicines or diet alone, you should test at least once daily, usually before breakfast.  A weekly profile will help your caregiver advise you on your care plan. The week before your visit, check your glucose before a meal and 2 hours after a meal at least daily. You may want to test before and after a different meal each day so you and your caregiver can tell how well controlled your blood sugars are throughout the course of a 24 hour period.  Gestational diabetes (diabetes during pregnancy).  Frequent testing is often necessary. Accurate timing is important.  If you are not on insulin, check your glucose 4 times a day. Check it before breakfast and 1 hour after the start of each meal.  If you are on insulin, check your glucose 6 times a day. Check it before each meal and 1 hour after the first bite of  each meal.  General guidelines.  More frequent testing is required at the start of insulin treatment. Your caregiver will instruct you.  Test your glucose any time you suspect you have low blood sugar (hypoglycemia).  You should test more often when you change medicines, when you have unusual stress or illness, or in other unusual circumstances. OTHER THINGS TO KNOW ABOUT GLUCOSE METERS  Measurement Range. Most glucose meters are able to read glucose levels over a broad range of values from as low as 0 to as high as 600 mg/dL. If you get an extremely high or low reading from your meter, you should first confirm it with another reading. Report very high or very low readings to your caregiver.  Whole Blood Glucose versus Plasma Glucose. Some older home glucose meters measure glucose in your whole blood. In a lab or when using some newer home glucose  meters, the glucose is measured in your plasma (one component of blood). The difference can be important. It is important for you and your caregiver to know whether your meter gives its results as "whole blood equivalent" or "plasma equivalent."  Display of High and Low Glucose Values. Part of learning how to operate a meter is understanding what the meter results mean. Know how high and low glucose concentrations are displayed on your meter.  Factors that Affect Glucose Meter Performance. The accuracy of your test results depends on many factors and varies depending on the brand and type of meter. These factors include:  Low red blood cell count (anemia).  Substances in your blood (such as uric acid, vitamin C, and others).  Environmental factors (temperature, humidity, altitude).  Name-brand versus generic test strips.  Calibration. Make sure your meter is set up properly. It is a good idea to do a calibration test with a control solution recommended by the manufacturer of your meter whenever you begin using a fresh bottle of test strips. This  will help verify the accuracy of your meter.  Improperly stored, expired, or defective test strips. Keep your strips in a dry place with the lid on.  Soiled meter.  Inadequate blood sample. NEW TECHNOLOGIES FOR GLUCOSE TESTING Alternative site testing Some glucose meters allow testing blood from alternative sites. These include the:  Upper arm.  Forearm.  Base of the thumb.  Thigh. Sampling blood from alternative sites may be desirable. However, it may have some limitations. Blood in the fingertips show changes in glucose levels more quickly than blood in other parts of the body. This means that alternative site test results may be different from fingertip test results, not because of the meter's ability to test accurately, but because the actual glucose concentration can be different.  Continuous Glucose Monitoring Devices to measure your blood glucose continuously are available, and others are in development. These methods can be more expensive than self-monitoring with a glucose meter. However, it is uncertain how effective and reliable these devices are. Your caregiver will advise you if this approach makes sense for you. IF BLOOD SUGARS ARE CONTROLLED, PEOPLE WITH DIABETES REMAIN HEALTHIER.  SMBG is an important part of the treatment plan of patients with diabetes mellitus. Below are reasons for using SMBG:   It confirms that your glucose is at a specific, healthy level.  It detects hypoglycemia and severe hyperglycemia.  It allows you and your caregiver to make adjustments in response to changes in lifestyle for individuals requiring medicine.  It determines the need for starting insulin therapy in temporary diabetes that happens during pregnancy (gestational diabetes). Document Released: 08/02/2003 Document Revised: 10/22/2011 Document Reviewed: 11/23/2010 [facility] Patient Information 2013 [place], [place].

## 2012-07-03 ENCOUNTER — Telehealth: Payer: Self-pay | Admitting: Family Medicine

## 2012-07-03 NOTE — Telephone Encounter (Signed)
I'm not sure what eye drops she is referring to.  I don't see any other than OTC on her med list.

## 2012-07-03 NOTE — Telephone Encounter (Signed)
Patient is calling because the eye drops that were prescribed are $58 and she can't afford that.  She would like something called in that will be much less.

## 2012-07-04 NOTE — Telephone Encounter (Signed)
Spoke with patient and it is the Voltaren eye drops that she is referring to. Is there ay other cheaper than that? She has no insurance and is paying full price

## 2012-07-09 NOTE — Telephone Encounter (Signed)
Has this been addressed?

## 2012-11-18 ENCOUNTER — Other Ambulatory Visit: Payer: Self-pay | Admitting: *Deleted

## 2012-11-18 ENCOUNTER — Other Ambulatory Visit: Payer: Self-pay | Admitting: Family Medicine

## 2012-11-18 DIAGNOSIS — I10 Essential (primary) hypertension: Secondary | ICD-10-CM

## 2012-11-18 MED ORDER — AMLODIPINE BESYLATE 10 MG PO TABS: 10.0000 mg | ORAL_TABLET | Freq: Every day | ORAL | Status: DC

## 2013-01-06 ENCOUNTER — Other Ambulatory Visit: Payer: Self-pay | Admitting: *Deleted

## 2013-01-06 MED ORDER — METFORMIN HCL 1000 MG PO TABS: ORAL_TABLET | ORAL | Status: DC

## 2013-01-06 NOTE — Telephone Encounter (Signed)
Patient has a CPE appointment scheduled with Dr. Paulina Fusi for 01/12/2013.  I authorized enough to get her to that appointment.  Ileana Ladd

## 2013-01-06 NOTE — Telephone Encounter (Signed)
Patient is calling because she is completely out of her Metformin.  The pharmacy has given her a 3 day emergency supply that she has already used so she needs her refill filled right away.

## 2013-01-12 ENCOUNTER — Encounter: Payer: Self-pay | Admitting: Family Medicine

## 2013-01-12 ENCOUNTER — Ambulatory Visit (INDEPENDENT_AMBULATORY_CARE_PROVIDER_SITE_OTHER): Payer: PRIVATE HEALTH INSURANCE | Admitting: Family Medicine

## 2013-01-12 VITALS — BP 132/85 | HR 66 | Ht 61.5 in | Wt 164.3 lb

## 2013-01-12 DIAGNOSIS — E119 Type 2 diabetes mellitus without complications: Secondary | ICD-10-CM

## 2013-01-12 DIAGNOSIS — E785 Hyperlipidemia, unspecified: Secondary | ICD-10-CM

## 2013-01-12 DIAGNOSIS — I10 Essential (primary) hypertension: Secondary | ICD-10-CM

## 2013-01-12 DIAGNOSIS — L918 Other hypertrophic disorders of the skin: Secondary | ICD-10-CM

## 2013-01-12 DIAGNOSIS — L919 Hypertrophic disorder of the skin, unspecified: Secondary | ICD-10-CM

## 2013-01-12 LAB — COMPLETE METABOLIC PANEL WITH GFR
ALT: 17 U/L (ref 0–35)
CO2: 28 mEq/L (ref 19–32)
Calcium: 9.8 mg/dL (ref 8.4–10.5)
Chloride: 101 mEq/L (ref 96–112)
GFR, Est African American: >89 mL/min
GFR, Est Non African American: 88 mL/min
Glucose, Bld: 118 mg/dL — ABNORMAL HIGH (ref 70–99)
Sodium: 140 mEq/L (ref 135–145)
Total Protein: 7.1 g/dL (ref 6.0–8.3)

## 2013-01-12 LAB — POCT GLYCOSYLATED HEMOGLOBIN (HGB A1C): Hemoglobin A1C: 7.7

## 2013-01-12 MED ORDER — LISINOPRIL-HYDROCHLOROTHIAZIDE 20-25 MG PO TABS: 1.0000 | ORAL_TABLET | Freq: Every day | ORAL | Status: DC

## 2013-01-12 MED ORDER — METFORMIN HCL 1000 MG PO TABS: ORAL_TABLET | ORAL | Status: DC

## 2013-01-12 MED ORDER — GLUCOSE BLOOD VI STRP: ORAL_STRIP | Status: AC

## 2013-01-12 MED ORDER — METFORMIN HCL 1000 MG PO TABS: 1000.0000 mg | ORAL_TABLET | Freq: Two times a day (BID) | ORAL | Status: DC

## 2013-01-12 MED ORDER — METOPROLOL TARTRATE 50 MG PO TABS: 50.0000 mg | ORAL_TABLET | Freq: Two times a day (BID) | ORAL | Status: DC

## 2013-01-12 NOTE — Assessment & Plan Note (Signed)
BP well controlled today, continue current regimen as is not have SE from Rx as well.  Will repeat BMP to evaluate for kidney function in regards to both her DM/HTN.  Will see back in 3 months.

## 2013-01-12 NOTE — Assessment & Plan Note (Addendum)
Will need to start on statin, most likely moderate potency.  Will get fasting lipid profile today to evaluate LDL and will need to start on statin, if can afford.

## 2013-01-12 NOTE — Assessment & Plan Note (Signed)
FBG have been well controlled ranging from 100s to 150s.  Will get A1C today as well as BMP to evaluate for kidney dysfunction.  Also, continue with Metformin 1500 mg qd, no myalgias described.

## 2013-01-12 NOTE — Progress Notes (Signed)
Megan Khan is a 57 y.o. female who presents today for physical exam and f/u on HTN/DM and skin tag on R maxillozygomatic region.    HTN - Pt states she has been compliant with medication, no SE including weakness, fatigue, edema, hypotension, blurred vision.    DM - Pt FBG have been between 100s to 150s and post prandial have been under 200.  Is compliant with metformin without N/V/D and no myalgias.    R Skin tag - Pt has had multiple skin tags in the past which have been removed.  This one is very similar about 2 cm inferolateral to the lateral aspect of the R eye.  Has caused her some discomfort when the skin tag gets stretched but otherwise no concerns.    Past Medical History  Diagnosis Date  . Hypertension   . Diabetes mellitus   . Hyperlipidemia   . Hx of colonic polyps   . H/O colonoscopy     History  Smoking status  . Former Smoker  . Types: Cigarettes  . Quit date: 09/23/1995  Smokeless tobacco  . Not on file    No family history on file.  Current Outpatient Prescriptions on File Prior to Visit  Medication Sig Dispense Refill  . amLODipine (NORVASC) 10 MG tablet Take 1 tablet (10 mg total) by mouth daily.  90 tablet  3  . aspirin 81 MG EC tablet Take 81 mg by mouth daily.        . fish oil-omega-3 fatty acids 1000 MG capsule Take 2 g by mouth daily.      Marland Kitchen glucose blood test strip Use as instructed - please provide strips appropriate for her glucometer  100 each  12  . Homeopathic Products ([facility] PINK EYE) SOLN Place 1-2 drops into both eyes every 4 (four) hours.        . Multiple Vitamins-Minerals (WOMENS MULTIVITAMIN PLUS) TABS Take 1 tablet by mouth daily.         No current facility-administered medications on file prior to visit.    ROS: Per HPI.  All other systems reviewed and are negative.   Physical Exam Filed Vitals:   01/12/13 0954  BP: 132/85  Pulse: 66    Physical Examination: General appearance - alert, well appearing, and in no  distress Eyes - pupils equal and reactive, extraocular eye movements intact Neck - No JVD/no LAD  Chest - clear to auscultation, no wheezes, rales or rhonchi, symmetric air entry Heart - normal rate, regular rhythm, normal S1, S2, no murmurs, rubs, clicks or gallops Skin - LESIONS NOTED: Skin Tag on the R maxillofacial region     Lab Results  Component Value Date   HGBA1C 7.7 01/12/2013

## 2013-01-12 NOTE — Assessment & Plan Note (Signed)
Pt filled out consent form and placed in paper chart.  Ethyl Chloride Spray applied to skin tag at R maxillo/zygomatic region before instrumental sissors used to remove.  Pt had minimal bleeding and silver nitrite was applied to the area.  No complications and pt tolerated well.

## 2013-01-12 NOTE — Patient Instructions (Signed)
Megan Khan, it was nice seeing you today.  We will refill your medications and also get some labwork.  We will see you back in three months as long as nothing comes up.  Thanks, Dr. Paulina Fusi

## 2013-01-13 ENCOUNTER — Other Ambulatory Visit: Payer: Self-pay

## 2013-01-13 DIAGNOSIS — Z1231 Encounter for screening mammogram for malignant neoplasm of breast: Secondary | ICD-10-CM

## 2013-02-19 ENCOUNTER — Ambulatory Visit: Payer: PRIVATE HEALTH INSURANCE

## 2013-02-19 ENCOUNTER — Other Ambulatory Visit: Payer: Self-pay

## 2013-03-12 ENCOUNTER — Ambulatory Visit
Admission: RE | Admit: 2013-03-12 | Discharge: 2013-03-12 | Disposition: A | Discharge status: Home / Self Care | Payer: PRIVATE HEALTH INSURANCE | Source: Ambulatory Visit

## 2013-03-12 DIAGNOSIS — Z1231 Encounter for screening mammogram for malignant neoplasm of breast: Secondary | ICD-10-CM

## 2013-04-03 ENCOUNTER — Ambulatory Visit (INDEPENDENT_AMBULATORY_CARE_PROVIDER_SITE_OTHER): Payer: PRIVATE HEALTH INSURANCE | Admitting: Family Medicine

## 2013-04-03 ENCOUNTER — Encounter: Payer: Self-pay | Admitting: Family Medicine

## 2013-04-03 VITALS — BP 139/81 | HR 95 | Temp 98.2°F | Ht 61.0 in | Wt 160.0 lb

## 2013-04-03 DIAGNOSIS — I10 Essential (primary) hypertension: Secondary | ICD-10-CM

## 2013-04-03 DIAGNOSIS — E785 Hyperlipidemia, unspecified: Secondary | ICD-10-CM

## 2013-04-03 DIAGNOSIS — E119 Type 2 diabetes mellitus without complications: Secondary | ICD-10-CM

## 2013-04-03 LAB — LIPID PANEL
Cholesterol: 184 mg/dL (ref 0–200)
HDL: 54 mg/dL (ref >39)
Total CHOL/HDL Ratio: 3.4 Ratio
VLDL: 22 mg/dL (ref 0–40)

## 2013-04-03 NOTE — Patient Instructions (Signed)
Diabetic Retinopathy Having diabetes for a long time, especially if it is not controlled, can damage the light-sensitive membrane at the back of the eye (retina). The disease of the retina caused by diabetes is called diabetic retinopathy. Taking good care of your diabetes helps reduce the risk of developing diabetic retinopathy. Have regular eye exams. Early detection is the key to keeping your eyes healthy. Diabetes can also affect other parts of the eye with vision-threatening results, such as cataracts and a form of glaucoma that is very difficult to treat. SYMPTOMS  In the early stages of diabetic retinopathy, there are often no symptoms. As the condition advances, symptoms may include:  Blurred vision. This may go away when blood glucose (sugar) levels are normal. This type of reversible change in vision is usually due to swelling of the lens.  Moving speck or dark spots (floaters) in your vision. This can be caused by small amounts of blood (hemorrhages) escaping from the blood vessels of the retina.  Missing parts of your field of vision. This can be caused by larger hemorrhages within the tissue of the retina.  Poor night vision.  Poor color vision.  Sudden drop or loss of vision in one eye. This may be caused by a hemorrhage from retinal blood vessels into the cavity of the inside of the eye. You should not wait until you have symptoms. An eye care specialist can start treatment before visual impairment occurs. DIAGNOSIS  Your eye care specialist can detect the diabetic changes in your blood vessels by putting drops in your eyes to enlarge the size of (dilate) your pupils. This allows a bigger "window" through which the caregiver can see the entire inside of your eyes.  TREATMENT   If you have the type of diabetes that requires you to use insulin, your risk of diabetic retinopathy is very high. You should have your eyes checked at least every 6 months.  If you have diabetes that was  diagnosed during childhood or before the age of 44, your risk of diabetic retinopathy is very high. You should have your eyes checked at least every 6 months.  If you have diabetes that is controlled by diet, you should have the dilated eye exam when first diagnosed and yearly thereafter.  If your diabetes is not under good control as measured by your blood glucose levels and other indicators, it is critical that you have your eyes checked even more often. Your caregiver can usually see the problems of diabetic retinopathy developing long before it causes a problem. In many cases, it can be treated to prevent complications. HOME CARE INSTRUCTIONS   Keep blood pressure in goal range.  Keep blood glucose in target range.  Follow your caregiver's orders regarding diet and other means for controlling your blood glucose levels.  Check both your urine and blood levels for glucose as recommended by your caregiver. SEEK MEDICAL CARE IF:   You notice gradual blurring or other changes in your vision over time.  You notice that your glasses or contact lenses do not make things look as sharp as they once did.  You have trouble reading or seeing details at a distance with either eye. SEEK IMMEDIATE MEDICAL CARE IF:   You notice a sudden change in your vision or parts of your field of vision appear missing or hazy. This may mean you have lost some vision. Get help right away to prevent further vision loss.  You suddenly see moving specks or dark spots  in the field of vision of either eye.  You have a sudden partial or total loss of vision in either eye. Document Released: 07/27/2000 Document Revised: 10/22/2011 Document Reviewed: 04/20/2009 [facility] Patient Information 2014 [place], [place].

## 2013-04-03 NOTE — Progress Notes (Signed)
Megan Khan is a 57 y.o. female who presents today for HTN, DM II, and Hyperlipidemia   HLD - Pt with previous hx of being on Zocor and has not been taking this medication for over a yr and unsure of last lipid profile.  No previous Hx of ADR to statin.   HTN - Stable/Controlled, no palpitations, edema, compliant with her Zestoretic and Norvasc.    DM II - Last A1C at 7.7, compliant with her metformin 1000 mg BID w/o GI upset or myalgias.  No neuropathic paresthesias described.   Past Medical History  Diagnosis Date  . Hypertension   . Diabetes mellitus   . Hyperlipidemia   . Hx of colonic polyps   . H/O colonoscopy     History  Smoking status  . Former Smoker  . Types: Cigarettes  . Quit date: 09/23/1995  Smokeless tobacco  . Not on file    No family history on file.  Current Outpatient Prescriptions on File Prior to Visit  Medication Sig Dispense Refill  . amLODipine (NORVASC) 10 MG tablet Take 1 tablet (10 mg total) by mouth daily.  90 tablet  3  . aspirin 81 MG EC tablet Take 81 mg by mouth daily.        . fish oil-omega-3 fatty acids 1000 MG capsule Take 2 g by mouth daily.      Marland Kitchen glucose blood test strip Use as instructed - please provide strips appropriate for her glucometer  100 each  12  . Homeopathic Products ([facility] PINK EYE) SOLN Place 1-2 drops into both eyes every 4 (four) hours.        Marland Kitchen lisinopril-hydrochlorothiazide (PRINZIDE,ZESTORETIC) 20-25 MG per tablet Take 1 tablet by mouth daily.  90 tablet  3  . metFORMIN (GLUCOPHAGE) 1000 MG tablet Take 1 tablet (1,000 mg total) by mouth 2 (two) times daily with a meal.  60 tablet  3  . metoprolol (LOPRESSOR) 50 MG tablet Take 1 tablet (50 mg total) by mouth 2 (two) times daily.  180 tablet  3  . Multiple Vitamins-Minerals (WOMENS MULTIVITAMIN PLUS) TABS Take 1 tablet by mouth daily.         No current facility-administered medications on file prior to visit.    ROS: Per HPI.  All other systems reviewed and are  negative.   Physical Exam Filed Vitals:   04/03/13 1035  BP: 139/81  Pulse: 95  Temp: 98.2 F (36.8 C)    Physical Examination: General appearance - alert, well appearing, and in no distress Chest - clear to auscultation, no wheezes, rales or rhonchi, symmetric air entry Heart - normal rate and regular rhythm, no murmurs noted Skin - normal coloration and turgor, no rashes, no suspicious skin lesions noted    Chemistry      Component Value Date/Time   NA 140 01/12/2013 1140   K 3.7 01/12/2013 1140   CL 101 01/12/2013 1140   CO2 28 01/12/2013 1140   BUN 11 01/12/2013 1140   CREATININE 0.76 01/12/2013 1140   CREATININE 0.73 09/27/2009 1934      Component Value Date/Time   CALCIUM 9.8 01/12/2013 1140   ALKPHOS 78 01/12/2013 1140   AST 19 01/12/2013 1140   ALT 17 01/12/2013 1140   BILITOT 0.5 01/12/2013 1140     Lab Results  Component Value Date   HGBA1C 7.7 01/12/2013

## 2013-04-03 NOTE — Assessment & Plan Note (Signed)
Will get FLP today as pt has not eaten.  Most likely will need to restart medium potency statin and went over risks and benefits of this.  Consider starting at next visit.

## 2013-04-03 NOTE — Assessment & Plan Note (Signed)
BP well controlled today, BMET from 6/2 showing creatinine of  Lab Results  Component Value Date   CREATININE 0.76 01/12/2013   And compliant with her medications including Zestoretic and norvasc.  Consider repeat BMET in 6-12 months and continue to follow.

## 2013-04-03 NOTE — Assessment & Plan Note (Addendum)
Pt due for A1C in 2-3 weeks at which point she will come back in for the lab draw.  Compliant with her metformin with no myalgias or dyspepsia.  Retinal Scan performed today and report will be reviewed pending result.

## 2013-04-06 ENCOUNTER — Encounter: Payer: Self-pay | Admitting: Family Medicine

## 2013-04-08 ENCOUNTER — Ambulatory Visit: Payer: PRIVATE HEALTH INSURANCE

## 2013-05-12 ENCOUNTER — Ambulatory Visit: Payer: PRIVATE HEALTH INSURANCE

## 2013-05-21 ENCOUNTER — Ambulatory Visit (INDEPENDENT_AMBULATORY_CARE_PROVIDER_SITE_OTHER): Payer: PRIVATE HEALTH INSURANCE | Admitting: *Deleted

## 2013-05-21 DIAGNOSIS — Z23 Encounter for immunization: Secondary | ICD-10-CM

## 2013-08-10 ENCOUNTER — Other Ambulatory Visit: Payer: Self-pay | Admitting: Family Medicine

## 2013-09-15 ENCOUNTER — Ambulatory Visit (INDEPENDENT_AMBULATORY_CARE_PROVIDER_SITE_OTHER): Payer: Self-pay | Admitting: Family Medicine

## 2013-09-15 ENCOUNTER — Encounter: Payer: Self-pay | Admitting: Family Medicine

## 2013-09-15 VITALS — BP 114/74 | HR 69 | Temp 98.3°F | Ht 61.0 in | Wt 164.1 lb

## 2013-09-15 DIAGNOSIS — Z8669 Personal history of other diseases of the nervous system and sense organs: Secondary | ICD-10-CM

## 2013-09-15 DIAGNOSIS — I10 Essential (primary) hypertension: Secondary | ICD-10-CM

## 2013-09-15 DIAGNOSIS — E785 Hyperlipidemia, unspecified: Secondary | ICD-10-CM

## 2013-09-15 DIAGNOSIS — E119 Type 2 diabetes mellitus without complications: Secondary | ICD-10-CM

## 2013-09-15 LAB — COMPREHENSIVE METABOLIC PANEL
ALBUMIN: 4 g/dL (ref 3.5–5.2)
ALT: 12 U/L (ref 0–35)
AST: 11 U/L (ref 0–37)
Alkaline Phosphatase: 61 U/L (ref 39–117)
BUN: 16 mg/dL (ref 6–23)
CALCIUM: 9.5 mg/dL (ref 8.4–10.5)
CHLORIDE: 101 meq/L (ref 96–112)
CO2: 30 mEq/L (ref 19–32)
Creat: 0.85 mg/dL (ref 0.50–1.10)
Glucose, Bld: 122 mg/dL — ABNORMAL HIGH (ref 70–99)
POTASSIUM: 3.4 meq/L — AB (ref 3.5–5.3)
SODIUM: 141 meq/L (ref 135–145)
TOTAL PROTEIN: 6.6 g/dL (ref 6.0–8.3)
Total Bilirubin: 0.4 mg/dL (ref 0.2–1.2)

## 2013-09-15 LAB — POCT GLYCOSYLATED HEMOGLOBIN (HGB A1C): Hemoglobin A1C: 6.4

## 2013-09-15 MED ORDER — LOVASTATIN 20 MG PO TABS: 20.0000 mg | ORAL_TABLET | Freq: Every day | ORAL | Status: DC

## 2013-09-15 NOTE — Progress Notes (Signed)
Megan Khan is a 58 y.o. female who presents today for HTN, DM II, Hyperlipidemia   HTN - Stable, compliant with Norvasc, Zestoretic, denies palpitations, edema, HA, blurred vision, cough.   DM II - Compliant with metformin, denies N/V/D.  Needs referral for optometrist for eye check.  Hyperlipidemia - Direct LDL in August 2014 108, ASCVD < 7.5% with DM II.  Has not had SE from statin in past.    Past Medical History  Diagnosis Date  . Hypertension   . Diabetes mellitus   . Hyperlipidemia   . Hx of colonic polyps   . H/O colonoscopy     History  Smoking status  . Former Smoker  . Types: Cigarettes  . Quit date: 09/23/1995  Smokeless tobacco  . Not on file    No family history on file.  Current Outpatient Prescriptions on File Prior to Visit  Medication Sig Dispense Refill  . amLODipine (NORVASC) 10 MG tablet Take 1 tablet (10 mg total) by mouth daily.  90 tablet  3  . aspirin 81 MG EC tablet Take 81 mg by mouth daily.        . fish oil-omega-3 fatty acids 1000 MG capsule Take 2 g by mouth daily.      Marland Kitchen glucose blood test strip Use as instructed - please provide strips appropriate for her glucometer  100 each  12  . Homeopathic Products ([facility] P[facility] EYE) SOLN Place 1-2 drops into both eyes every 4 (four) hours.        Marland Kitchen lisinopril-hydrochlorothiazide (PRINZIDE,ZESTORETIC) 20-25 MG per tablet Take 1 tablet by mouth daily.  90 tablet  3  . metFORMIN (GLUCOPHAGE) 1000 MG tablet Take 1 tablet (1,000 mg total) by mouth 2 (two) times daily with a meal.  60 tablet  3  . metFORMIN (GLUCOPHAGE) 1000 MG tablet TAKE ONE-HALF TABLET BY MOUTH ONCE DAILY IN THE MORNING AND ONE TABLET ONCE DAILY IN THE EVENING  90 tablet  2  . metoprolol (LOPRESSOR) 50 MG tablet Take 1 tablet (50 mg total) by mouth 2 (two) times daily.  180 tablet  3  . Multiple Vitamins-Minerals (WOMENS MULTIVITAMIN PLUS) TABS Take 1 tablet by mouth daily.         No current facility-administered medications on file  prior to visit.    ROS: Per HPI.  All other systems reviewed and are negative.   Physical Exam Filed Vitals:   09/15/13 1400  BP: 114/74  Pulse: 69  Temp: 98.3 F (36.8 C)    Physical Examination: General appearance - alert, well appearing, and in no distress Chest - clear to auscultation, no wheezes, rales or rhonchi, symmetric air entry Heart - normal rate and regular rhythm, systolic murmur 2/6 at apex and decrescendo towards axilla     Chemistry      Component Value Date/Time   NA 140 01/12/2013 1140   K 3.7 01/12/2013 1140   CL 101 01/12/2013 1140   CO2 28 01/12/2013 1140   BUN 11 01/12/2013 1140   CREATININE 0.76 01/12/2013 1140   CREATININE 0.73 09/27/2009 1934      Component Value Date/Time   CALCIUM 9.8 01/12/2013 1140   ALKPHOS 78 01/12/2013 1140   AST 19 01/12/2013 1140   ALT 17 01/12/2013 1140   BILITOT 0.5 01/12/2013 1140      Lab Results  Component Value Date   HGBA1C 6.4 09/15/2013

## 2013-09-15 NOTE — Assessment & Plan Note (Signed)
A1C well controlled today, compliant with metformin.  Obtain LFT's today and f/u in 3 months.    

## 2013-09-15 NOTE — Assessment & Plan Note (Signed)
Start on Lovastatin 20 mg today with ASCVD calculated to < 7.5% with DM II.  Will obtain CMP today to check LFTs, counseling given on myalgias or hematuria and to stop the medication and call.  F/u in three months.

## 2013-09-15 NOTE — Patient Instructions (Signed)
Ms. Stoneberg, it was nice seeing you today.  Please start taking lovastatin 20 mg every night and stop taking with calling us if you start to have generalized muscle aches or start to have blood in your urine.  Thanks, Dr. Awanda Mink

## 2013-09-15 NOTE — Assessment & Plan Note (Signed)
Well-controlled, continue current regimen 

## 2013-10-14 ENCOUNTER — Ambulatory Visit: Payer: Self-pay

## 2013-10-19 ENCOUNTER — Ambulatory Visit (INDEPENDENT_AMBULATORY_CARE_PROVIDER_SITE_OTHER): Payer: BC Managed Care – PPO | Admitting: *Deleted

## 2013-10-19 DIAGNOSIS — Z111 Encounter for screening for respiratory tuberculosis: Secondary | ICD-10-CM

## 2013-10-21 ENCOUNTER — Ambulatory Visit (INDEPENDENT_AMBULATORY_CARE_PROVIDER_SITE_OTHER): Payer: BC Managed Care – PPO | Admitting: *Deleted

## 2013-10-21 ENCOUNTER — Encounter: Payer: Self-pay | Admitting: *Deleted

## 2013-10-21 DIAGNOSIS — Z111 Encounter for screening for respiratory tuberculosis: Secondary | ICD-10-CM

## 2013-10-21 LAB — TB SKIN TEST
INDURATION: 0 mm
TB Skin Test: NEGATIVE

## 2013-11-02 ENCOUNTER — Other Ambulatory Visit: Payer: Self-pay | Admitting: Family Medicine

## 2013-12-14 DIAGNOSIS — H401123 Primary open-angle glaucoma, left eye, severe stage: Secondary | ICD-10-CM | POA: Insufficient documentation

## 2013-12-20 DIAGNOSIS — H401113 Primary open-angle glaucoma, right eye, severe stage: Secondary | ICD-10-CM | POA: Insufficient documentation

## 2013-12-28 ENCOUNTER — Ambulatory Visit (INDEPENDENT_AMBULATORY_CARE_PROVIDER_SITE_OTHER): Payer: BC Managed Care – PPO | Admitting: Sports Medicine

## 2013-12-28 ENCOUNTER — Other Ambulatory Visit (HOSPITAL_COMMUNITY)
Admission: RE | Admit: 2013-12-28 | Discharge: 2013-12-28 | Disposition: A | Discharge status: Home / Self Care | Payer: BC Managed Care – PPO | Source: Ambulatory Visit | Attending: Sports Medicine | Admitting: Sports Medicine

## 2013-12-28 ENCOUNTER — Encounter: Payer: Self-pay | Admitting: Sports Medicine

## 2013-12-28 VITALS — BP 148/90 | HR 68 | Temp 97.9°F | Ht 61.0 in | Wt 160.9 lb

## 2013-12-28 DIAGNOSIS — Z113 Encounter for screening for infections with a predominantly sexual mode of transmission: Secondary | ICD-10-CM | POA: Insufficient documentation

## 2013-12-28 DIAGNOSIS — K439 Ventral hernia without obstruction or gangrene: Secondary | ICD-10-CM | POA: Insufficient documentation

## 2013-12-28 DIAGNOSIS — N950 Postmenopausal bleeding: Secondary | ICD-10-CM | POA: Insufficient documentation

## 2013-12-28 DIAGNOSIS — N898 Other specified noninflammatory disorders of vagina: Secondary | ICD-10-CM

## 2013-12-28 DIAGNOSIS — Z124 Encounter for screening for malignant neoplasm of cervix: Secondary | ICD-10-CM

## 2013-12-28 DIAGNOSIS — Z1151 Encounter for screening for human papillomavirus (HPV): Secondary | ICD-10-CM | POA: Insufficient documentation

## 2013-12-28 DIAGNOSIS — Z01419 Encounter for gynecological examination (general) (routine) without abnormal findings: Secondary | ICD-10-CM | POA: Insufficient documentation

## 2013-12-28 LAB — POCT WET PREP (WET MOUNT): Clue Cells Wet Prep Whiff POC: NEGATIVE

## 2013-12-28 NOTE — Progress Notes (Signed)
  NICKOLE ADAMEK - 58 y.o. female MRN [id number]  Date of birth: September 06, 1955  CC, SUBJECTIVE & ROS:     If applicable, see problem based charting for additional problem specific documentation. HPI Comments: Patient presents with: - Painful intercourse w/spotting - 1 day; mainly immediately after; cramping; monomagous; slight odor; no dysuria Pt denies any fevers, chills, or rigors.  No other episodes of postmenopausal bleeding.  - "knot in stomach" - poss. hernia getting bigger As noted over the past couple of months.  No pain associated with this area.  Not interested in surgery at this time.   HISTORY: Past Medical, Surgical, Social, and Family History Reviewed & Updated per EMR.  Pertinent Historical Findings include: Early menopause approximately 11 years ago. Glaucoma, diabetes  OBJECTIVE:  BP:148/90 mmHg  HR:68bpm  TEMP:97.9 F (36.6 C)(Oral)  RESP:   HT:5\' 1"  (154.9 cm)   WT:160 lb 14.4 oz (72.984 kg)  BMI:30.5 Physical Exam  Constitutional: She is well-developed, well-nourished, and in no distress. No distress.  HENT:  Head: Normocephalic and atraumatic.  Eyes: Right eye exhibits no discharge. Left eye exhibits no discharge. No scleral icterus.  Pulmonary/Chest: Effort normal. No respiratory distress.  Skin: She is not diaphoretic.  Psychiatric: Mood, memory, affect and judgment normal.   pelvic exam Chaperone: CMA  External: no skin lesions, normal appearing genitalia, no discharge  Vagina: no vaginal lesion, vaginal mucosa slightly atrophic with hypopigmented areas on lateral walls suspicious for scarring   Cervix: no cervical lesions, no CMT  Uterus: normal size, shape, consistency  non-tender  Adenexa: no masses, non-tender  TESTS:   GC, chlamydia, wet prep, Pap smear     MEDICATIONS, LABS & OTHER ORDERS: Previous Medications   AMLODIPINE (NORVASC) 10 MG TABLET    TAKE ONE TABLET BY MOUTH DAILY   ASPIRIN 81 MG EC TABLET    Take 81 mg by mouth daily.     FISH  OIL-OMEGA-3 FATTY ACIDS 1000 MG CAPSULE    Take 2 g by mouth daily.   GLUCOSE BLOOD TEST STRIP    Use as instructed - please provide strips appropriate for her glucometer   HOMEOPATHIC PRODUCTS ([employer] PINK EYE) SOLN    Place 1-2 drops into both eyes every 4 (four) hours.     LISINOPRIL-HYDROCHLOROTHIAZIDE (PRINZIDE,ZESTORETIC) 20-25 MG PER TABLET    Take 1 tablet by mouth daily.   LOVASTATIN (MEVACOR) 20 MG TABLET    Take 1 tablet (20 mg total) by mouth at bedtime.   METFORMIN (GLUCOPHAGE) 1000 MG TABLET    Take 1 tablet (1,000 mg total) by mouth 2 (two) times daily with a meal.   METFORMIN (GLUCOPHAGE) 1000 MG TABLET    TAKE ONE-HALF TABLET BY MOUTH ONCE DAILY IN THE MORNING AND ONE TABLET ONCE DAILY IN THE EVENING   METOPROLOL (LOPRESSOR) 50 MG TABLET    Take 1 tablet (50 mg total) by mouth 2 (two) times daily.   MULTIPLE VITAMINS-MINERALS (WOMENS MULTIVITAMIN PLUS) TABS    Take 1 tablet by mouth daily.     Modified Medications   No medications on file   New Prescriptions   No medications on file   Discontinued Medications   No medications on file   Orders Placed This Encounter  Procedures  . US Pelvis Complete  . POCT Wet Prep [facility])   ASSESSMENT & PLAN: See problem based charting & AVS for pt instructions.

## 2013-12-28 NOTE — Patient Instructions (Signed)
It was nice me today. We are setting you up for a pelvic ultrasound.  You will hear from our office regarding these results and what we need to do next. Please use a personal lubricant during sexual intercourse; this will help prevent any further irritation.  You said you were okay with holding off on having ear stomach evaluated.  If he continues to bothers you, continues to enlarge or becomes painful please let us know.  If you have significant pain and bulging you will need to be seen in the emergency department.

## 2013-12-28 NOTE — Assessment & Plan Note (Signed)
Single isolated event, postcoital in nature, question atrophic vaginitis - no risk factors for endometrial cancer 1. Pap smear collected today, schedule for pelvic ultrasound. 2. Encourage personal lubrication during intercourse > if ultrasound is equivocal or recurrent event she will need endometrial biopsy and I discussed this with the patient.

## 2013-12-28 NOTE — Assessment & Plan Note (Signed)
Chronic stable condition - less likely to be contributing to dyspareunia 1. Discussed options for referral versus watchful waiting and decided to continue conservative treatment.  Red flags reviewed > consider referral to surgery for evaluation  .

## 2013-12-30 ENCOUNTER — Ambulatory Visit (HOSPITAL_COMMUNITY)
Admission: RE | Admit: 2013-12-30 | Discharge: 2013-12-30 | Disposition: A | Discharge status: Home / Self Care | Payer: BC Managed Care – PPO | Source: Ambulatory Visit | Attending: Family Medicine | Admitting: Family Medicine

## 2013-12-30 ENCOUNTER — Other Ambulatory Visit: Payer: Self-pay | Admitting: Sports Medicine

## 2013-12-30 DIAGNOSIS — N950 Postmenopausal bleeding: Secondary | ICD-10-CM | POA: Insufficient documentation

## 2013-12-30 DIAGNOSIS — N9489 Other specified conditions associated with female genital organs and menstrual cycle: Secondary | ICD-10-CM | POA: Insufficient documentation

## 2014-01-01 ENCOUNTER — Ambulatory Visit (INDEPENDENT_AMBULATORY_CARE_PROVIDER_SITE_OTHER): Payer: BC Managed Care – PPO | Admitting: Sports Medicine

## 2014-01-01 ENCOUNTER — Encounter: Payer: Self-pay | Admitting: Sports Medicine

## 2014-01-01 VITALS — BP 126/83 | HR 64 | Temp 98.2°F | Ht 61.0 in | Wt 160.0 lb

## 2014-01-01 DIAGNOSIS — N950 Postmenopausal bleeding: Secondary | ICD-10-CM

## 2014-01-01 LAB — POCT URINE PREGNANCY: PREG TEST UR: NEGATIVE

## 2014-01-01 NOTE — Assessment & Plan Note (Addendum)
Acute condition  - thickened endometrial stripe on ultrasound on 12/30/2013 with evidence of a 1.4 x 1.7 x 1.4 cm echogenic mass within the endometrium demonstrating mass effect.    1. Biopsy performed today > Will followup with results via telephone.  If positive for hyperplasia or carcinoma will need to be seen by gynecologist ASAP.   If equivocal findings may consider pelvic MRI  PROCEDURE NOTE: Endometrial Biopsy Patient given informed consent, signed copy in the chart.  Urine pregnancy test negative.  Appropriate time out taken. . The patient was placed in the lithotomy position and the cervix brought into view with sterile speculum.  The portio of cervix was cleansed x 3 with betadine swabs.  A tenaculum was placed in the anterior lip of the cervix.  A uterine sound was used to measure the uterus -mild cervical stenosis, measured 5 cm until firm resistance met.. A pipelle was introduced  into the uterus, suction created,  and an endometrial sample was obtained x2. All equipment was removed and accounted for.   The patient tolerated the procedure well.  Minimal spotting type bleeding.  Patient given post procedure instructions.

## 2014-01-01 NOTE — Progress Notes (Signed)
  Megan Khan - 58 y.o. female MRN [id number]  Date of birth: 1955-11-20  CC, SUBJECTIVE & ROS:     If applicable, see problem based charting for additional problem specific documentation. Chief Complaint   Vaginal Bleeding     patient reports feeling no different than the other day.  Not having any acute complaints.  Pt denies any fevers, chills, or rigors.  HISTORY: Past Medical, Surgical, Social, and Family History Reviewed & Updated per EMR.  Pertinent Historical Findings include: Recent office visit for postmenopausal bleeding with ultrasound results concerning for endometrial hyperplasia with lesion causing mass effect within the endometrial lining.  OBJECTIVE:  BP:126/83 mmHg  HR:64bpm  TEMP:98.2 F (36.8 C)(Oral)  RESP:   HT:5\' 1"  (154.9 cm)   WT:160 lb (72.576 kg)  BMI:30.3 Physical Exam  Constitutional: She is well-developed, well-nourished, and in no distress. No distress.  HENT:  Head: Normocephalic and atraumatic.  Eyes: Right eye exhibits no discharge. Left eye exhibits no discharge. No scleral icterus.  Pulmonary/Chest: Effort normal. No respiratory distress.  Skin: She is not diaphoretic.  Psychiatric: Mood, memory, affect and judgment normal.   pelvic exam: Chaperone: CMA - JO % Dr. Gwendlyn Deutscher present    External: no skin lesions, normal appearing genitalia,  no  discharge  Vagina: no vaginal lesion, vaginal mucosa  moist   Cervix: no cervical lesions, no CMT  Uterus: normal size, shape, consistency  non-tender, Retroverted   Adenexa:  deferred   TESTS:  Endometrial biopsy performed     MEDICATIONS, LABS & OTHER ORDERS: Previous Medications   AMLODIPINE (NORVASC) 10 MG TABLET    TAKE ONE TABLET BY MOUTH DAILY   ASPIRIN 81 MG EC TABLET    Take 81 mg by mouth daily.     FISH OIL-OMEGA-3 FATTY ACIDS 1000 MG CAPSULE    Take 2 g by mouth daily.   GLUCOSE BLOOD TEST STRIP    Use as instructed - please provide strips appropriate for her glucometer   HOMEOPATHIC PRODUCTS ([employer] PINK EYE) SOLN    Place 1-2 drops into both eyes every 4 (four) hours.     LISINOPRIL-HYDROCHLOROTHIAZIDE (PRINZIDE,ZESTORETIC) 20-25 MG PER TABLET    Take 1 tablet by mouth daily.   LOVASTATIN (MEVACOR) 20 MG TABLET    Take 1 tablet (20 mg total) by mouth at bedtime.   METFORMIN (GLUCOPHAGE) 1000 MG TABLET    Take 1 tablet (1,000 mg total) by mouth 2 (two) times daily with a meal.   METFORMIN (GLUCOPHAGE) 1000 MG TABLET    TAKE ONE-HALF TABLET BY MOUTH ONCE DAILY IN THE MORNING AND ONE TABLET ONCE DAILY IN THE EVENING   METOPROLOL (LOPRESSOR) 50 MG TABLET    Take 1 tablet (50 mg total) by mouth 2 (two) times daily.   MULTIPLE VITAMINS-MINERALS (WOMENS MULTIVITAMIN PLUS) TABS    Take 1 tablet by mouth daily.     Modified Medications   No medications on file   New Prescriptions   No medications on file   Orders Placed This Encounter  Procedures  . POCT urine pregnancy   pathology ordered  ASSESSMENT & PLAN: See problem based charting & AVS for pt instructions.

## 2014-01-01 NOTE — Patient Instructions (Addendum)
Take ibuprofen 800mg  (4 tabs of 200mg ) three times per day or aleeve 440 mg (2 tabs of 220mg ) twice per day for the next couple of days to help with the cramping.  Please take with food.  I will call you with the results next week  Endometrial Biopsy Endometrial biopsy is a procedure in which a tissue sample is taken from inside the uterus. The tissue sample is then looked at under a microscope to see if the tissue is normal or abnormal. The endometrium is the lining of the uterus. This procedure helps determine where you are in your menstrual cycle and how hormone levels are affecting the lining of the uterus. This procedure may also be used to evaluate uterine bleeding or to diagnose endometrial cancer, tuberculosis, polyps, or inflammatory conditions.  LET [facility] CARE PROVIDER KNOW ABOUT:  Any allergies you have.  All medicines you are taking, including vitamins, herbs, eye drops, creams, and over-the-counter medicines.  Previous problems you or members of your family have had with the use of anesthetics.  Any blood disorders you have.  Previous surgeries you have had.  Medical conditions you have.  Possibility of pregnancy. RISKS AND COMPLICATIONS Generally, this is a safe procedure. However, as with any procedure, complications can occur. Possible complications include:  Bleeding.  Pelvic infection.  Puncture of the uterine wall with the biopsy device (rare). BEFORE THE PROCEDURE   Keep a record of your menstrual cycles as directed by your health care provider. You may need to schedule your procedure for a specific time in your cycle.  You may want to bring a sanitary pad to wear home after the procedure.  Arrange for someone to drive you home after the procedure if you will be given a medicine to help you relax (sedative). PROCEDURE   You may be given a sedative to relax you.  You will lie on an exam table with your feet and legs supported as in a pelvic  exam.  Your health care provider will insert an instrument (speculum) into your vagina to see your cervix.  Your cervix will be cleansed with an antiseptic solution. A medicine (local anesthetic) will be used to numb the cervix.  A forceps instrument (tenaculum) will be used to hold your cervix steady for the biopsy.  A thin, rodlike instrument (uterine sound) will be inserted through your cervix to determine the length of your uterus and the location where the biopsy sample will be removed.  A thin, flexible tube (catheter) will be inserted through your cervix and into the uterus. The catheter is used to collect the biopsy sample from your endometrial tissue.  The catheter and speculum will then be removed, and the tissue sample will be sent to a lab for examination. AFTER THE PROCEDURE  You will rest in a recovery area until you are ready to go home.  You may have mild cramping and a small amount of vaginal bleeding for a few days after the procedure. This is normal.  Make sure you find out how to get your test results. Document Released: 11/30/2004 Document Revised: 04/01/2013 Document Reviewed: 01/14/2013 [facility] Patient Information 2014 [place], [place].

## 2014-01-15 ENCOUNTER — Telehealth: Payer: Self-pay | Admitting: Sports Medicine

## 2014-01-15 DIAGNOSIS — N950 Postmenopausal bleeding: Secondary | ICD-10-CM

## 2014-01-15 NOTE — Telephone Encounter (Signed)
Called and discussed the results from the patient's endometrial biopsy.  Given the difficulty advancing the curette and probe during the procedure I suspect the results as demonstrated on the tissue report reflects inadequate sampling due to the obstructing mass.  I have discussed the options of either pursuing with a pelvic MRI or go ahead and have the patient be seen by a gynecologist and the patient agrees that seeing gynecologist for further discussion as to the next step is what she would prefer to do.  I suspect she will ultimately need a hysteroscopy so will defer MRI at this time to the Gynecologist.

## 2014-01-15 NOTE — Assessment & Plan Note (Signed)
Incomplete tissue sampling due to difficulty entering the uterine cavity likely secondary to obstructing mass seen on ultrasound.  No further episodes of bleeding.  Will refer to gynecology at [facility] for consideration of pelvic MRI versus hysteroscopy.

## 2014-02-02 ENCOUNTER — Encounter: Payer: Self-pay | Admitting: Obstetrics & Gynecology

## 2014-02-12 ENCOUNTER — Other Ambulatory Visit: Payer: Self-pay | Admitting: Family Medicine

## 2014-03-11 ENCOUNTER — Encounter: Payer: BC Managed Care – PPO | Admitting: Obstetrics & Gynecology

## 2014-03-15 ENCOUNTER — Telehealth: Payer: Self-pay | Admitting: *Deleted

## 2014-03-15 NOTE — Telephone Encounter (Signed)
Pt left voice message stating she need diabetic testing kit.  She has been using her mother's supplies, most of the supplies are depleted.  She uses the [employer].  Pt is completely out of testing strips.  Pt does not have any insurance.  Megan Barrow, RN

## 2014-03-15 NOTE — Telephone Encounter (Signed)
Discussed with pt that her DM is well controlled (last A1C 6.4) and does not need to be checking her sugars daily while only on metformin.  Did recommend she come in soon for follow up and she states she will call soon to make an appointment.  Tamela Oddi Margee Trentham, DO of [facility] 03/15/2014, 9:30 AM

## 2014-03-15 NOTE — Telephone Encounter (Signed)
Appt scheduled 03/25/2014 at 2:30 PM.  Derl Barrow, RN

## 2014-03-25 ENCOUNTER — Ambulatory Visit (INDEPENDENT_AMBULATORY_CARE_PROVIDER_SITE_OTHER): Payer: Self-pay | Admitting: Family Medicine

## 2014-03-25 ENCOUNTER — Encounter: Payer: Self-pay | Admitting: Family Medicine

## 2014-03-25 VITALS — BP 109/64 | HR 120 | Temp 98.2°F | Ht 61.0 in | Wt 160.5 lb

## 2014-03-25 DIAGNOSIS — I10 Essential (primary) hypertension: Secondary | ICD-10-CM

## 2014-03-25 DIAGNOSIS — E119 Type 2 diabetes mellitus without complications: Secondary | ICD-10-CM

## 2014-03-25 DIAGNOSIS — E785 Hyperlipidemia, unspecified: Secondary | ICD-10-CM

## 2014-03-25 DIAGNOSIS — N950 Postmenopausal bleeding: Secondary | ICD-10-CM

## 2014-03-25 LAB — COMPREHENSIVE METABOLIC PANEL
ALT: 11 U/L (ref 0–35)
AST: 11 U/L (ref 0–37)
Albumin: 4.2 g/dL (ref 3.5–5.2)
Alkaline Phosphatase: 64 U/L (ref 39–117)
BUN: 11 mg/dL (ref 6–23)
CALCIUM: 9.5 mg/dL (ref 8.4–10.5)
CO2: 29 meq/L (ref 19–32)
Chloride: 100 mEq/L (ref 96–112)
Creat: 0.86 mg/dL (ref 0.50–1.10)
Glucose, Bld: 114 mg/dL — ABNORMAL HIGH (ref 70–99)
POTASSIUM: 3.5 meq/L (ref 3.5–5.3)
Sodium: 138 mEq/L (ref 135–145)
Total Bilirubin: 0.4 mg/dL (ref 0.2–1.2)
Total Protein: 6.7 g/dL (ref 6.0–8.3)

## 2014-03-25 LAB — CBC
HCT: 38.1 % (ref 36.0–46.0)
Hemoglobin: 12.7 g/dL (ref 12.0–15.0)
MCH: 26.9 pg (ref 26.0–34.0)
MCHC: 33.3 g/dL (ref 30.0–36.0)
MCV: 80.7 fL (ref 78.0–100.0)
PLATELETS: 300 10*3/uL (ref 150–400)
RBC: 4.72 MIL/uL (ref 3.87–5.11)
RDW: 14.3 % (ref 11.5–15.5)
WBC: 5.3 10*3/uL (ref 4.0–10.5)

## 2014-03-25 LAB — LDL CHOLESTEROL, DIRECT: Direct LDL: 125 mg/dL — ABNORMAL HIGH

## 2014-03-25 LAB — POCT GLYCOSYLATED HEMOGLOBIN (HGB A1C): HEMOGLOBIN A1C: 6.7

## 2014-03-25 MED ORDER — LOVASTATIN 20 MG PO TABS: 20.0000 mg | ORAL_TABLET | Freq: Every day | ORAL | Status: DC

## 2014-03-25 NOTE — Progress Notes (Signed)
Megan Khan is a 58 y.o. female who presents today for HTN, DM II, Hyperlipidemia   HTN - Stable, compliant with Norvasc, Zestoretic, denies palpitations, edema, HA, blurred vision, cough.   DM II - Compliant with metformin, denies N/V/D.  Followed by Dr. Gershon Crane 2/2 glaucoma and for retinal scan.    Hyperlipidemia - Direct LDL in August 2014 108, ASCVD < 7.5% with DM II.  Has not had SE from statin in past.  Rx Lovastatin in February but never picked it up, denies any barriers to taking this medication.     Post - menopausal bleeding - Supposed to be seen by gynecology but lost her insurance coverage was not sufficient.  [place] from 12/2013 showing most likely fibroid and pt would like to wait until she gets the orange card prior to getting another referral.   Past Medical History  Diagnosis Date  . Hypertension   . Diabetes mellitus   . Hyperlipidemia   . Hx of colonic polyps     History  Smoking status  . Former Smoker  . Types: Cigarettes  . Quit date: 09/23/1995  Smokeless tobacco  . Not on file    No family history on file.  Current Outpatient Prescriptions on File Prior to Visit  Medication Sig Dispense Refill  . amLODipine (NORVASC) 10 MG tablet TAKE ONE TABLET BY MOUTH DAILY  90 tablet  3  . aspirin 81 MG EC tablet Take 81 mg by mouth daily.        . fish oil-omega-3 fatty acids 1000 MG capsule Take 2 g by mouth daily.      . Homeopathic Products ([facility] PINK EYE) SOLN Place 1-2 drops into both eyes every 4 (four) hours.        Marland Kitchen lisinopril-hydrochlorothiazide (PRINZIDE,ZESTORETIC) 20-25 MG per tablet TAKE ONE TABLET BY MOUTH ONCE DAILY  90 tablet  1  . lovastatin (MEVACOR) 20 MG tablet Take 1 tablet (20 mg total) by mouth at bedtime.  90 tablet  3  . metFORMIN (GLUCOPHAGE) 1000 MG tablet Take 1 tablet (1,000 mg total) by mouth 2 (two) times daily with a meal.  60 tablet  3  . metFORMIN (GLUCOPHAGE) 1000 MG tablet TAKE ONE-HALF TABLET BY MOUTH IN THE MORNING AND ONE  TABLET BY MOUTH IN THE EVENING  90 tablet  2  . metoprolol (LOPRESSOR) 50 MG tablet TAKE ONE TABLET BY MOUTH TWICE DAILY  180 tablet  1  . Multiple Vitamins-Minerals (WOMENS MULTIVITAMIN PLUS) TABS Take 1 tablet by mouth daily.         No current facility-administered medications on file prior to visit.    ROS: Per HPI.  All other systems reviewed and are negative.   Physical Exam Filed Vitals:   03/25/14 1438  BP: 109/64  Pulse: 120  Temp: 98.2 F (36.8 C)    Physical Examination: General appearance - alert, well appearing, and in no distress Chest - clear to auscultation, no wheezes, rales or rhonchi, symmetric air entry Heart - normal rate and regular rhythm, systolic murmur 2/6 at apex and decrescendo towards axilla     Chemistry      Component Value Date/Time   NA 141 09/15/2013 1427   K 3.4* 09/15/2013 1427   CL 101 09/15/2013 1427   CO2 30 09/15/2013 1427   BUN 16 09/15/2013 1427   CREATININE 0.85 09/15/2013 1427   CREATININE 0.73 09/27/2009 1934      Component Value Date/Time   CALCIUM 9.5 09/15/2013 1427  ALKPHOS 61 09/15/2013 1427   AST 11 09/15/2013 1427   ALT 12 09/15/2013 1427   BILITOT 0.4 09/15/2013 1427      Lab Results  Component Value Date   HGBA1C 6.7 03/25/2014

## 2014-03-25 NOTE — Patient Instructions (Signed)
Please let us know when you get the orange card so we can refer you back to gynecology.    THanks, Dr. Awanda Mink

## 2014-03-25 NOTE — Assessment & Plan Note (Signed)
No further bleeding but would like her to have at least endometrial biopsy since having some post menopausal bleeding and unsuccessful bx in June 2015.  Referral to gynecology when pt gets the orange card, as she does not want this now because she can't afford due to insurance purposes.

## 2014-03-25 NOTE — Assessment & Plan Note (Signed)
Did not start lovastatin after last visit, new Rx given, explained possible SE and pt states she will take to health department today for labwork.  Direct LDL on today's visit.

## 2014-03-25 NOTE — Assessment & Plan Note (Signed)
Well-controlled, continue current regimen 

## 2014-03-25 NOTE — Assessment & Plan Note (Signed)
A1C well controlled today, compliant with metformin.  Obtain LFT's today and f/u in 3 months.

## 2014-03-26 ENCOUNTER — Encounter: Payer: Self-pay | Admitting: Family Medicine

## 2014-04-15 ENCOUNTER — Ambulatory Visit: Payer: Self-pay

## 2014-04-16 ENCOUNTER — Ambulatory Visit: Payer: Self-pay

## 2014-05-24 ENCOUNTER — Ambulatory Visit: Payer: Self-pay

## 2014-05-27 ENCOUNTER — Ambulatory Visit: Payer: Self-pay

## 2014-06-14 ENCOUNTER — Encounter: Payer: Self-pay | Admitting: Family Medicine

## 2014-07-20 ENCOUNTER — Other Ambulatory Visit: Payer: Self-pay | Admitting: Family Medicine

## 2014-08-13 DIAGNOSIS — H269 Unspecified cataract: Secondary | ICD-10-CM

## 2014-08-13 HISTORY — DX: Unspecified cataract: H26.9

## 2014-08-31 ENCOUNTER — Other Ambulatory Visit: Payer: Self-pay | Admitting: Family Medicine

## 2014-09-02 ENCOUNTER — Other Ambulatory Visit: Payer: Self-pay | Admitting: *Deleted

## 2014-09-02 MED ORDER — LISINOPRIL-HYDROCHLOROTHIAZIDE 20-25 MG PO TABS: 1.0000 | ORAL_TABLET | Freq: Every day | ORAL | Status: DC

## 2014-09-02 MED ORDER — METFORMIN HCL 1000 MG PO TABS: ORAL_TABLET | ORAL | Status: DC

## 2014-12-01 ENCOUNTER — Other Ambulatory Visit: Payer: Self-pay | Admitting: Family Medicine

## 2014-12-16 ENCOUNTER — Ambulatory Visit (INDEPENDENT_AMBULATORY_CARE_PROVIDER_SITE_OTHER): Payer: Self-pay | Admitting: Family Medicine

## 2014-12-16 ENCOUNTER — Encounter: Payer: Self-pay | Admitting: Family Medicine

## 2014-12-16 VITALS — BP 132/97 | HR 105 | Temp 98.3°F | Ht 61.0 in | Wt 160.5 lb

## 2014-12-16 DIAGNOSIS — I10 Essential (primary) hypertension: Secondary | ICD-10-CM

## 2014-12-16 DIAGNOSIS — E785 Hyperlipidemia, unspecified: Secondary | ICD-10-CM

## 2014-12-16 DIAGNOSIS — E119 Type 2 diabetes mellitus without complications: Secondary | ICD-10-CM

## 2014-12-16 LAB — COMPREHENSIVE METABOLIC PANEL
ALBUMIN: 4.2 g/dL (ref 3.5–5.2)
ALK PHOS: 54 U/L (ref 39–117)
ALT: 16 U/L (ref 0–35)
AST: 13 U/L (ref 0–37)
BUN: 9 mg/dL (ref 6–23)
CO2: 27 mEq/L (ref 19–32)
Calcium: 9.4 mg/dL (ref 8.4–10.5)
Chloride: 102 mEq/L (ref 96–112)
Creat: 0.76 mg/dL (ref 0.50–1.10)
Glucose, Bld: 106 mg/dL — ABNORMAL HIGH (ref 70–99)
POTASSIUM: 3.8 meq/L (ref 3.5–5.3)
Sodium: 141 mEq/L (ref 135–145)
TOTAL PROTEIN: 7.1 g/dL (ref 6.0–8.3)
Total Bilirubin: 0.4 mg/dL (ref 0.2–1.2)

## 2014-12-16 LAB — LIPID PANEL
CHOL/HDL RATIO: 3 ratio
CHOLESTEROL: 152 mg/dL (ref 0–200)
HDL: 51 mg/dL (ref >=46)
LDL Cholesterol: 79 mg/dL (ref 0–99)
Triglycerides: 110 mg/dL (ref <150)
VLDL: 22 mg/dL (ref 0–40)

## 2014-12-16 LAB — POCT GLYCOSYLATED HEMOGLOBIN (HGB A1C): HEMOGLOBIN A1C: 7.1

## 2014-12-16 MED ORDER — METFORMIN HCL 1000 MG PO TABS: 1000.0000 mg | ORAL_TABLET | Freq: Two times a day (BID) | ORAL | Status: DC

## 2014-12-16 NOTE — Progress Notes (Signed)
Megan Khan is a 59 y.o. female who presents today for HTN, DM II, Hyperlipidemia   HTN - Stable, compliant with Norvasc, Zestoretic, denies palpitations, edema, HA, blurred vision, cough.   DM II - Compliant with metformin, denies N/V/D.  Followed by Dr. Gershon Crane 2/2 glaucoma and for retinal scan.  Now going to go to retinal specialist/glaucoma specialist when obtains insurance.    Hyperlipidemia - Direct LDL in August 2014 108, ASCVD < 7.5% with DM II.  Has not had SE from statin in past. Started Lovastatin in August 2015, has done well w/o myalgias.  Compliant with medication   Past Medical History  Diagnosis Date  . Hypertension   . Diabetes mellitus   . Hyperlipidemia   . Hx of colonic polyps     History  Smoking status  . Former Smoker  . Types: Cigarettes  . Quit date: 09/23/1995  Smokeless tobacco  . Not on file    No family history on file.  Current Outpatient Prescriptions on File Prior to Visit  Medication Sig Dispense Refill  . amLODipine (NORVASC) 10 MG tablet TAKE ONE TABLET BY MOUTH DAILY 90 tablet 0  . aspirin 81 MG EC tablet Take 81 mg by mouth daily.      . fish oil-omega-3 fatty acids 1000 MG capsule Take 2 g by mouth daily.    . Homeopathic Products ([employer] PINK EYE) SOLN Place 1-2 drops into both eyes every 4 (four) hours.      Marland Kitchen lisinopril-hydrochlorothiazide (PRINZIDE,ZESTORETIC) 20-25 MG per tablet Take 1 tablet by mouth daily. 90 tablet 1  . lovastatin (MEVACOR) 20 MG tablet Take 1 tablet (20 mg total) by mouth at bedtime. 90 tablet 3  . metFORMIN (GLUCOPHAGE) 1000 MG tablet TAKE ONE-HALF TABLET BY MOUTH ONCE DAILY IN THE MORNING AND  TAKE  ONE  TABLET  BY  MOUTH  IN  THE  EVENING 120 tablet 3  . metoprolol (LOPRESSOR) 50 MG tablet TAKE ONE TABLET BY MOUTH TWICE DAILY 180 tablet 3  . Multiple Vitamins-Minerals (WOMENS MULTIVITAMIN PLUS) TABS Take 1 tablet by mouth daily.       No current facility-administered medications on file prior to visit.     ROS: Per HPI.  All other systems reviewed and are negative.   Physical Exam Filed Vitals:   12/16/14 0956  BP: 132/97  Pulse: 105  Temp: 98.3 F (36.8 C)   Repeat BP R arm - 126/76  Physical Examination: General appearance - alert, well appearing, and in no distress Chest - clear to auscultation, no wheezes, rales or rhonchi, symmetric air entry Heart - normal rate and regular rhythm, systolic murmur 2/6 at apex and decrescendo towards axilla     Chemistry      Component Value Date/Time   NA 138 03/25/2014 1459   K 3.5 03/25/2014 1459   CL 100 03/25/2014 1459   CO2 29 03/25/2014 1459   BUN 11 03/25/2014 1459   CREATININE 0.86 03/25/2014 1459   CREATININE 0.73 09/27/2009 1934      Component Value Date/Time   CALCIUM 9.5 03/25/2014 1459   ALKPHOS 64 03/25/2014 1459   AST 11 03/25/2014 1459   ALT 11 03/25/2014 1459   BILITOT 0.4 03/25/2014 1459      Lab Results  Component Value Date   HGBA1C 7.1 12/16/2014

## 2014-12-16 NOTE — Assessment & Plan Note (Signed)
Well controlled, continue current regimen Check BMET today F/U in 3-6 months

## 2014-12-16 NOTE — Assessment & Plan Note (Signed)
Continue Lovastatin Check Lipid Panel today

## 2014-12-16 NOTE — Assessment & Plan Note (Signed)
A1C slightly elevated today - Continue Metformin 1000 mg BID.  Check renal fxn today F/U in 3-6 months

## 2014-12-16 NOTE — Patient Instructions (Signed)
Good seeing you as always. Continue your medications. We will see you back in 6 months.  Thanks, Dr. Awanda Mink

## 2014-12-17 ENCOUNTER — Encounter: Payer: Self-pay | Admitting: Family Medicine

## 2015-02-10 ENCOUNTER — Encounter: Payer: Self-pay | Admitting: Family Medicine

## 2015-02-10 ENCOUNTER — Ambulatory Visit (INDEPENDENT_AMBULATORY_CARE_PROVIDER_SITE_OTHER): Payer: Self-pay | Admitting: Family Medicine

## 2015-02-10 ENCOUNTER — Ambulatory Visit: Payer: Self-pay | Admitting: Family Medicine

## 2015-02-10 VITALS — BP 128/82 | HR 64 | Temp 98.1°F | Ht 61.0 in | Wt 160.9 lb

## 2015-02-10 DIAGNOSIS — H409 Unspecified glaucoma: Secondary | ICD-10-CM

## 2015-02-10 DIAGNOSIS — K429 Umbilical hernia without obstruction or gangrene: Secondary | ICD-10-CM

## 2015-02-10 NOTE — Assessment & Plan Note (Signed)
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 No new changes today that are concerning for acute evaluation.  - Will refer back to Dr. Edilia Bo for evaluation.

## 2015-02-10 NOTE — Patient Instructions (Signed)
Umbilical Herniorrhaphy Herniorrhaphy is surgery to repair a hernia. A hernia is the protrusion of a part of an organ through an abdominal opening. An umbilical hernia means that your hernia is in the area around your navel. If the hernia is not repaired, the gap could get bigger. Your intestines or other tissues, such as fat, could get trapped in the gap. This can lead to other health problems, such as blocked intestines. If the hernia is fixed before problems set in, you may be allowed to go home the same day as the surgery (outpatient). LET [facility] CARE PROVIDER KNOW ABOUT:  Allergies to food or medicine.  Medicines taken, including vitamins, herbs, eye drops, over-the-counter medicines, and creams.  Use of steroids (by mouth or creams).  Previous problems with anesthetics or numbing medicines.  History of bleeding problems or blood clots.  Previous surgery.  Other health problems, including diabetes and kidney problems.  Possibility of pregnancy, if this applies. RISKS AND COMPLICATIONS  Pain.  Excessive bleeding.  Hematoma. This is a pocket of blood that collects under the surgery site.  Infection at the surgery site.  Numbness at the surgery site.  Swelling and bruising.  Blood clots.  Intestinal damage (rare).  Scarring.  Skin damage.  Development of another hernia. This may require another surgery. BEFORE THE PROCEDURE  Ask your health care provider about changing or stopping your regular medicines. You may need to stop taking aspirin, nonsteroidal anti-inflammatory drugs (NSAIDs), vitamin E, and blood thinners as early as 2 weeks before the procedure.  Do not eat or drink for 8 hours before the procedure, or as directed by your health care provider.  You might be asked to shower or wash with an antibacterial soap before the procedure.  Wear comfortable clothes that will be easy to put on after the procedure. PROCEDURE You will be given an intravenous  (IV) tube. A needle will be inserted in your arm. Medicine will flow directly into your body through this needle. You might be given medicine to help you relax (sedative). You will be given medicine that numbs the area (local anesthetic) or medicine that makes you sleep (general anesthetic). If you have open surgery:  The surgeon will make a cut (incision) in your abdomen.  The gap in the muscle wall will be repaired. The surgeon may sew the edges together over the gap or use a mesh material to strengthen the area. When mesh is used, the body grows new, strong tissue into and around it. This new tissue closes the gap.  A drain might be put in to remove excess fluid from the body after surgery.  The surgeon will close the incision with stitches, glue, or staples. If you have laparoscopic surgery:  The surgeon will make several small incisions in your abdomen.  A thin, lighted tube (laparoscope) will be inserted into the abdomen through an incision. A camera is attached to the laparoscope that allows the surgeon to see inside the abdomen.  Tools will be inserted through the other incisions to repair the hernia. Usually, mesh is used to cover the gap.  The surgeon will close the incisions with stitches. AFTER THE PROCEDURE  You will be taken to a recovery area. A nurse will watch and check your progress.  When you are awake, feeling well, and taking fluids well, you may be allowed to go home. In some cases, you may need to stay overnight in the hospital.  Arrange for someone to drive you home.  Document Released: 10/26/2008 Document Revised: 12/14/2013 Document Reviewed: 10/31/2011 [facility] Patient Information 2015 [place], [place]. This information is not intended to replace advice given to you by your health care provider. Make sure you discuss any questions you have with your health care provider.

## 2015-02-10 NOTE — Progress Notes (Signed)
Megan Khan is a 59 y.o. female who presents today for eye pain.  Eye Pain - Known glaucoma for over one year now.  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  No changes in vision in R eye at this time.  Previously had seen Dr. Edilia Bo for this but has not been in awhile.  Denies HA, temporal pain, neck pain, or paresthesias.   Ventral Hernia - Hernia ongoing now for 1.5 years now.  Easily reducible, and has not become incarcerated to this point.  Denies N/V/D, but does have intermittent pain which will get better with rest.  Has not seen surgery for this.   Past Medical History  Diagnosis Date  . Hypertension   . Diabetes mellitus   . Hyperlipidemia   . Hx of colonic polyps     History  Smoking status  . Former Smoker  . Types: Cigarettes  . Quit date: 09/23/1995  Smokeless tobacco  . Not on file    No family history on file.  Current Outpatient Prescriptions on File Prior to Visit  Medication Sig Dispense Refill  . amLODipine (NORVASC) 10 MG tablet TAKE ONE TABLET BY MOUTH DAILY 90 tablet 0  . aspirin 81 MG EC tablet Take 81 mg by mouth daily.      . fish oil-omega-3 fatty acids 1000 MG capsule Take 2 g by mouth daily.    . Homeopathic Products ([employer] PINK EYE) SOLN Place 1-2 drops into both eyes every 4 (four) hours.      Marland Kitchen lisinopril-hydrochlorothiazide (PRINZIDE,ZESTORETIC) 20-25 MG per tablet Take 1 tablet by mouth daily. 90 tablet 1  . lovastatin (MEVACOR) 20 MG tablet Take 1 tablet (20 mg total) by mouth at bedtime. 90 tablet 3  . metFORMIN (GLUCOPHAGE) 1000 MG tablet Take 1 tablet (1,000 mg total) by mouth 2 (two) times daily with a meal. 180 tablet 3  . metoprolol (LOPRESSOR) 50 MG tablet TAKE ONE TABLET BY MOUTH TWICE DAILY 180 tablet 3  . Multiple Vitamins-Minerals (WOMENS MULTIVITAMIN PLUS) TABS Take 1 tablet by mouth daily.       No current facility-administered medications on file prior to visit.    ROS: Per HPI.  All other systems  reviewed and are negative.   Physical Exam Filed Vitals:   02/10/15 1411  BP: 128/82  Pulse: 64  Temp: 98.1 F (36.7 C)    Physical Examination: General appearance - alert, well appearing, and in no distress Eyes - PERRLA, EOMI B/L, no AV nicking or disc protrusion  Abdomen - Soft/Nt/ND, reducible umbilical hernia w/o TTP

## 2015-02-10 NOTE — Assessment & Plan Note (Signed)
No evidence of incarceration at this time - Referral to surgery for evaluation of repair.

## 2015-03-09 ENCOUNTER — Ambulatory Visit: Payer: Self-pay

## 2015-03-11 ENCOUNTER — Encounter: Payer: Self-pay | Admitting: Family Medicine

## 2015-03-11 ENCOUNTER — Ambulatory Visit (INDEPENDENT_AMBULATORY_CARE_PROVIDER_SITE_OTHER): Payer: Self-pay | Admitting: Family Medicine

## 2015-03-11 VITALS — BP 136/87 | HR 71 | Temp 97.9°F | Ht 61.0 in | Wt 161.3 lb

## 2015-03-11 DIAGNOSIS — E1169 Type 2 diabetes mellitus with other specified complication: Secondary | ICD-10-CM

## 2015-03-11 DIAGNOSIS — I10 Essential (primary) hypertension: Secondary | ICD-10-CM

## 2015-03-11 DIAGNOSIS — R5383 Other fatigue: Secondary | ICD-10-CM

## 2015-03-11 DIAGNOSIS — K439 Ventral hernia without obstruction or gangrene: Secondary | ICD-10-CM

## 2015-03-11 DIAGNOSIS — H409 Unspecified glaucoma: Secondary | ICD-10-CM

## 2015-03-11 DIAGNOSIS — E785 Hyperlipidemia, unspecified: Secondary | ICD-10-CM

## 2015-03-11 MED ORDER — LISINOPRIL-HYDROCHLOROTHIAZIDE 20-25 MG PO TABS: 1.0000 | ORAL_TABLET | Freq: Every day | ORAL | Status: DC

## 2015-03-11 MED ORDER — AMLODIPINE BESYLATE 10 MG PO TABS: 10.0000 mg | ORAL_TABLET | Freq: Every day | ORAL | Status: DC

## 2015-03-11 MED ORDER — LOVASTATIN 20 MG PO TABS
20.0000 mg | ORAL_TABLET | Freq: Every day | ORAL | Status: DC
Start: 2015-03-11 — End: 2015-03-11

## 2015-03-11 MED ORDER — LOVASTATIN 20 MG PO TABS: 20.0000 mg | ORAL_TABLET | Freq: Every day | ORAL | Status: DC

## 2015-03-11 NOTE — Patient Instructions (Signed)
Follow up in 3-6 months for blood pressure and sugar.  Gifford Ballon M. Lajuana Ripple, DO PGY-2, [facility]    Glaucoma Glaucoma happens when the fluid pressure in the eyeball is too high. The pressure cannot stay high for too long, or the eyeball may become damaged. Signs of glaucoma include:  Having a hard time seeing in a dark room after being in a bright one.  Having trouble seeing things out to the sides of you.  Blurry sight.  Seeing bright white lights or colors in front of your eyes.  Headaches.  Feeling sick to your stomach (nauseous) or throwing up (vomiting).  Sudden vision loss. Glaucoma testing is an important part of taking care of your eyesight. HOME CARE  Always use your eyedrops or pills as told by your doctor. Do not run out.  Do not go away from home without your eyedrops or pills.  Keep your appointments.  Always tell a new doctor that you have glaucoma and how long you have had it. Tell the doctor about the eyedrops and pills you take.  Do not use eyedrops or pills that have not been prescribed by your doctor. GET HELP RIGHT AWAY IF:  You develop severe pain in the affected eye.  You develop vision problems.  You develop a bad headache in the area around the eye.  You feel sick to your stomach or throw up.  You start to have problems with the other eye. MAKE SURE YOU:   Understand these instructions.  Will watch your condition.  Will get help right away if you are not doing well or get worse. Document Released: 05/08/2008 Document Revised: 10/22/2011 Document Reviewed: 05/08/2008 [facility] Patient Information 2015 [place], [place]. This information is not intended to replace advice given to you by your health care provider. Make sure you discuss any questions you have with your health care provider.

## 2015-03-11 NOTE — Progress Notes (Signed)
Patient ID: Megan Khan, female   DOB: 12-27-1955, 59 y.o.   MRN: [id number]    Subjective: CC: hernia, glaucoma HPI: Patient is a 59 y.o. female presenting to clinic today for follow up visit. Concerns today include:  1. Umbilical Hernia Medicaid has not gone through, so was not able to see surgeon just yet.  Expects that Medicaid should come through soon.  Hernia is increasing in size.  Denies nausea, vomiting, abdominal pain, fevers.    2. Glaucoma Has appointment in August to see Disability eye doctor, since Medicaid is not active yet.  Reports that her eye run and ache.  Sometimes are red.  She has a h/o L eye blindness.  Has a sensation of foreign body in L eye.  This has been evaluated previously and there was nothing in it.  Uses Timolol eye drops.  3. Fatigue Patient reports that she has been feeling drained since yesterday.  She went bed early and she still feels tired today.  She does not take a daily vitamin.  Has not done anything outside her norm.  Has not eaten anything outside of the norm.  She is stressed out though.  She has been out of work since Jan and has been working to get disability for her blindness.  Sick contacts include grandchild that had strep.  No cough, congestion, sore throat, fevers, chills, nausea or rash.  Social History Reviewed: non smoker. FamHx and MedHx updated.  Please see EMR.  ROS: All other systems reviewed and are negative.  Objective: Office vital signs reviewed. BP 136/87 mmHg  Pulse 71  Temp(Src) 97.9 F (36.6 C) (Oral)  Ht 5\' 1"  (1.549 m)  Wt 161 lb 4.8 oz (73.165 kg)  BMI 30.49 kg/m2  Physical Examination:  General: Awake, alert, well nourished, NAD HEENT: Normal    Eyes: R eye reactive to light, L eye not reactive to light, no pain with ocular movement Cardio: RRR, S1S2 heard, no murmurs appreciated Pulm: normal WOB GI: soft, NT/ND,+BS x4, no hepatomegaly, no splenomegaly; baseball sized ventral hernia appreciated, no  TTP, hernia is reducible MSK: Normal gait and station Neuro: follows commands  Assessment/Plan: 59 y.o. female with  1. HYPERTENSION, BENIGN SYSTEMIC Controlled. - amLODipine (NORVASC) 10 MG tablet; Take 1 tablet (10 mg total) by mouth daily.  Dispense: 90 tablet; Refill: 1 - lisinopril-hydrochlorothiazide (PRINZIDE,ZESTORETIC) 20-25 MG per tablet; Take 1 tablet by mouth daily.  Dispense: 90 tablet; Refill: 1 - Plan to follow up in 3-6 months for HTN f/u  2. Hyperlipidemia associated with type 2 diabetes mellitus - Lipid panel reviewed. - lovastatin (MEVACOR) 20 MG tablet; Take 1 tablet (20 mg total) by mouth at bedtime.  Dispense: 90 tablet; Refill: 4  3. Ventral hernia without obstruction or gangrene Does not appear strangulated.  No TTP on exam.  No red flags.  Per patient increasing in size. - Patient has referral for general surgery.  Waiting on medicaid to cover. - Return precautions reviewed. - Plan to f/u after has been evaluated.  4. Glaucoma No red flags on exam. - Patient to follow up with ophthalmologist next week - Return precautions reviewed.  5. Fatigue Suspect that is related to sugar vs oncoming URI (recently sick grandchild) - Hand written Rx given for DM testing supplies to bring to health department.  [name], DO PGY-2, [place]

## 2015-03-14 ENCOUNTER — Other Ambulatory Visit: Payer: Self-pay | Admitting: *Deleted

## 2015-03-14 MED ORDER — METFORMIN HCL 1000 MG PO TABS: 1000.0000 mg | ORAL_TABLET | Freq: Two times a day (BID) | ORAL | Status: DC

## 2015-03-16 ENCOUNTER — Telehealth: Payer: Self-pay | Admitting: Family Medicine

## 2015-03-16 MED ORDER — METFORMIN HCL 1000 MG PO TABS: 1000.0000 mg | ORAL_TABLET | Freq: Two times a day (BID) | ORAL | Status: DC

## 2015-03-16 NOTE — Telephone Encounter (Signed)
Pt called because she said the prescription that Krist[facility] Oppen[facility] has is not correct. She said that she takes 2000 mg a day and they said the prescription they have is for 1500 mg. In Epic it shows we sent it correctly. Can we call and straighten this out. jw

## 2015-03-16 NOTE — Addendum Note (Signed)
Addended by: Derl Barrow on: 03/16/2015 09:08 AM   Modules accepted: Orders

## 2015-03-16 NOTE — Telephone Encounter (Signed)
Resent Rx for Metformin from 03/14/15 electronically.  Rx stated no print.  Derl Barrow, RN

## 2015-05-13 ENCOUNTER — Ambulatory Visit (INDEPENDENT_AMBULATORY_CARE_PROVIDER_SITE_OTHER): Payer: Medicaid Other | Admitting: *Deleted

## 2015-05-13 DIAGNOSIS — Z23 Encounter for immunization: Secondary | ICD-10-CM | POA: Diagnosis present

## 2015-05-16 ENCOUNTER — Telehealth: Payer: Self-pay | Admitting: Family Medicine

## 2015-05-16 NOTE — Telephone Encounter (Signed)
Attempted to call patient re: Personal care services.  These services are intended to assist with EATING, BATHING, DRESSING, TOILETING and MOBILITY.  Patient has a hernia, which requires repair but this should not be limiting her from doing the aforementioned.  She should only be limited from lifting heavy objects, which this does not cover.  Please advise patient that this document will be returned to her along with her son's (who I WAS able to fill out) in the mail.  [name], DO PGY-2, [place]

## 2015-05-20 ENCOUNTER — Telehealth: Payer: Self-pay | Admitting: Family Medicine

## 2015-05-20 NOTE — Telephone Encounter (Signed)
Form dropped off to be filled out for personal care services.  Please mail to her when completed.

## 2015-05-23 NOTE — Telephone Encounter (Signed)
Nothing for clinic staff to complete beforehand.  Placed in MD box. Ragan Duhon, Salome Spotted

## 2015-05-23 NOTE — Telephone Encounter (Signed)
Spoke to patient on the phone.  I reviewed her chart 05/16/15, when this was initially provided to me.  Patient is able to perform ADLs independently.  We discussed the nature of PCS, patient is looking for an aid to help with house chores/ cooking.  PCS is not an appropriate avenue for this.  Will discard form.

## 2015-05-30 ENCOUNTER — Telehealth: Payer: Self-pay | Admitting: Family Medicine

## 2015-05-30 NOTE — Telephone Encounter (Signed)
Pt called and would like for a member of the clinical staff to contact her to "go over some information". Would not be specific. Megan Khan, ASA

## 2015-06-02 ENCOUNTER — Telehealth: Payer: Self-pay | Admitting: Family Medicine

## 2015-06-02 NOTE — Telephone Encounter (Signed)
Patient's Daughter dropped [place] form to be completed by PCP. Please, follow up with Patient or her daughter [phone]).

## 2015-06-02 NOTE — Telephone Encounter (Signed)
Contacted pt and she was inquiring for another pt unsure of their name, it sounded like [facility](?), she is inquiring what needs to be done for the person to go into a program and wanted to see about getting it submitted to Medicaid and I told her to get the information so that we could see what it is and if indeed it is something that can be turned into medicaid. Pt understood and said she would get the information. Katharina Caper, Nicholas Ossa D, [name]

## 2015-06-06 NOTE — Telephone Encounter (Signed)
Form placed in provider's box.  Nothing for clinic staff to complete. Theophile Harvie,CMA  

## 2015-06-06 NOTE — Telephone Encounter (Signed)
Addendum telephone call was 10/07 with patient.

## 2015-06-06 NOTE — Telephone Encounter (Signed)
As per out 05/30/15 conversation on the phone.  Patient is NOT physicall disabled such that she is unable to perform ADLs independently.  The form provided is not appropriate and cannot be filled out.  The PCS are not intended for persons who are capable.  Patient notes that she needed assistance with house chores.  PCS is not intended for this.  Please remind patient of this.

## 2015-06-06 NOTE — Telephone Encounter (Signed)
Spoke with daughter and she is aware of this.  She also spoke with Dr. Lajuana Ripple for clarification of needs.  Form disgarded. Megan Khan,CMA

## 2015-06-13 ENCOUNTER — Other Ambulatory Visit: Payer: Self-pay | Admitting: Surgery

## 2015-06-21 ENCOUNTER — Telehealth: Payer: Self-pay | Admitting: Family Medicine

## 2015-06-21 NOTE — Telephone Encounter (Signed)
Is having hernia surgery Nov 29. Will be down 4 weeks.  Needs some help in her home with household and with assisting with son Molli Barrows Please advise

## 2015-06-22 NOTE — Telephone Encounter (Signed)
Placed call no answer or voicemail. If patient calls, please inform her of the information below. Ottis Stain, CMA

## 2015-06-22 NOTE — Telephone Encounter (Signed)
I think that her surgeon should be able to arrange home health at discharge from the hospital after her surgery.  Usually, associating the home health referral with the hospitalization improves the chance that it will be accepted.  Please let me know if she has difficulty with them arranging this.  I will be happy to try and facilitate this if her surgeon cannot arrange this during her hospitalization.  Please advise patient.

## 2015-07-14 ENCOUNTER — Other Ambulatory Visit: Payer: Self-pay | Admitting: Family Medicine

## 2015-07-14 ENCOUNTER — Encounter: Payer: Self-pay | Admitting: Family Medicine

## 2015-07-14 ENCOUNTER — Telehealth: Payer: Self-pay | Admitting: Family Medicine

## 2015-07-14 DIAGNOSIS — K429 Umbilical hernia without obstruction or gangrene: Secondary | ICD-10-CM

## 2015-07-14 NOTE — Telephone Encounter (Signed)
I will be happy to place orders, but as I indicated last month this ideally should have been done by the surgeon.  It will likely take longer to get these services since they are being ordered outpatient.  Please advise patient.

## 2015-07-14 NOTE — Telephone Encounter (Signed)
Daughter called and would like to get orders for her mother to have home health. Her mother just had surgery and is blind, and she needs some help in the home since the daughter works and so does the son. So her mother is home alone at a lot of times. jw

## 2015-07-14 NOTE — Telephone Encounter (Signed)
I have placed order.  However, again, I am unsure that this will be approved since I am not the one that performed the surgery.  Patient will likely have to ask her surgeon for this order.  Please advise patient.

## 2015-07-14 NOTE — Telephone Encounter (Signed)
I have filled out a personal care services form for patient for a home health aid.  However, patient has not been seen since 02/2015 with me, so am not sure that this will be approved.  She may need to come in for evaluation in order for approval.  Will submit and see what happens.  Would recommend going ahead and scheduling an appointment.  Please call patient and relay this.

## 2015-07-15 NOTE — Telephone Encounter (Signed)
Spoke to daughter, informed her of the information below. Ottis Stain, CMA

## 2015-08-01 ENCOUNTER — Other Ambulatory Visit: Payer: Self-pay | Admitting: *Deleted

## 2015-08-01 MED ORDER — METOPROLOL TARTRATE 50 MG PO TABS: 50.0000 mg | ORAL_TABLET | Freq: Two times a day (BID) | ORAL | Status: DC

## 2015-08-30 ENCOUNTER — Other Ambulatory Visit: Payer: Self-pay | Admitting: *Deleted

## 2015-08-30 ENCOUNTER — Telehealth: Payer: Self-pay | Admitting: Family Medicine

## 2015-08-30 DIAGNOSIS — I10 Essential (primary) hypertension: Secondary | ICD-10-CM

## 2015-08-30 MED ORDER — LISINOPRIL-HYDROCHLOROTHIAZIDE 20-25 MG PO TABS
1.0000 | ORAL_TABLET | Freq: Every day | ORAL | Status: DC
Start: 2015-08-30 — End: 2015-09-29

## 2015-08-30 MED ORDER — AMLODIPINE BESYLATE 10 MG PO TABS: 10.0000 mg | ORAL_TABLET | Freq: Every day | ORAL | Status: DC

## 2015-08-30 NOTE — Telephone Encounter (Signed)
Pt called and is using a new pharmacy now and needs her prescriptions sent there. She is using [facility] [address]. She needs her Lisinopril, Metoprolol, and Amlodipine sent over. jw

## 2015-08-30 NOTE — Telephone Encounter (Signed)
Spoke to pt. The following Rx's need refilling, lisinopril, metoprololo and amlodipine,  and she would like them to be sent to [facility] on [address] [facility]. Ottis Stain, CMA

## 2015-08-30 NOTE — Telephone Encounter (Signed)
Advise patient that she can have her current meds transferred.  She needs to call her pharmacy to do this.

## 2015-08-30 NOTE — Telephone Encounter (Signed)
Please have patient schedule appointment for BP.  Was supposed to f/u in Oct

## 2015-09-01 NOTE — Telephone Encounter (Signed)
Appt scheduled for 09/29/15. Katharina Caper, Aaminah Forrester D, [name]

## 2015-09-29 ENCOUNTER — Encounter: Payer: Self-pay | Admitting: Family Medicine

## 2015-09-29 ENCOUNTER — Ambulatory Visit (INDEPENDENT_AMBULATORY_CARE_PROVIDER_SITE_OTHER): Payer: Medicaid Other | Admitting: Family Medicine

## 2015-09-29 VITALS — BP 138/84 | HR 62 | Temp 98.0°F | Ht 61.0 in | Wt 165.6 lb

## 2015-09-29 DIAGNOSIS — I10 Essential (primary) hypertension: Secondary | ICD-10-CM | POA: Diagnosis not present

## 2015-09-29 DIAGNOSIS — K429 Umbilical hernia without obstruction or gangrene: Secondary | ICD-10-CM | POA: Diagnosis not present

## 2015-09-29 DIAGNOSIS — E119 Type 2 diabetes mellitus without complications: Secondary | ICD-10-CM | POA: Diagnosis not present

## 2015-09-29 LAB — POCT GLYCOSYLATED HEMOGLOBIN (HGB A1C): Hemoglobin A1C: 9

## 2015-09-29 MED ORDER — LISINOPRIL-HYDROCHLOROTHIAZIDE 20-25 MG PO TABS: 1.0000 | ORAL_TABLET | Freq: Every day | ORAL | Status: DC

## 2015-09-29 MED ORDER — AMLODIPINE BESYLATE 10 MG PO TABS: 10.0000 mg | ORAL_TABLET | Freq: Every day | ORAL | Status: DC

## 2015-09-29 MED ORDER — BLOOD GLUCOSE MONITOR KIT: PACK | Status: AC

## 2015-09-29 NOTE — Assessment & Plan Note (Signed)
A1c elevated 9.0 from 7.1 <12 months ago. - DM2 foot exam performed.  WNL. - Had retinal exam 09/2015.  She is to call Dr Christy Gentles to have note sent over. - Continue Metformin - Check Glucose daily and if feeling hypo - Monitor and supplies rx'd today - Declined adding medication.  Discussed that this was AMA. - Lifestyle changes/ carb counting. - Follow up in 3 months.

## 2015-09-29 NOTE — Patient Instructions (Addendum)
Your A1c is elevated.  We discussed adding a new medication.  I still recommend this but agree that you can work on lifestyle modifications and we can revisit this in May.  Check you sugar every morning before you eat each morning.  Blood Glucose Monitoring, Adult Monitoring your blood glucose (also know as blood sugar) helps you to manage your diabetes. It also helps you and your health care provider monitor your diabetes and determine how well your treatment plan is working. WHY SHOULD YOU MONITOR YOUR BLOOD GLUCOSE?  It can help you understand how food, exercise, and medicine affect your blood glucose.  It allows you to know what your blood glucose is at any given moment. You can quickly tell if you are having low blood glucose (hypoglycemia) or high blood glucose (hyperglycemia).  It can help you and your health care provider know how to adjust your medicines.  It can help you understand how to manage an illness or adjust medicine for exercise. WHEN SHOULD YOU TEST? Your health care provider will help you decide how often you should check your blood glucose. This may depend on the type of diabetes you have, your diabetes control, or the types of medicines you are taking. Be sure to write down all of your blood glucose readings so that this information can be reviewed with your health care provider. See below for examples of testing times that your health care provider may suggest. Type 1 Diabetes  Test at least 2 times per day if your diabetes is well controlled, if you are using an insulin pump, or if you perform multiple daily injections.  If your diabetes is not well controlled or if you are sick, you may need to test more often.  It is a good idea to also test:  Before every insulin injection.  Before and after exercise.  Between meals and 2 hours after a meal.  Occasionally between 2:00 a.m. and 3:00 a.m. Type 2 Diabetes  If you are taking insulin, test at least 2 times per  day. However, it is best to test before every insulin injection.  If you take medicines by mouth (orally), test 2 times a day.  If you are on a controlled diet, test once a day.  If your diabetes is not well controlled or if you are sick, you may need to monitor more often. HOW TO MONITOR YOUR BLOOD GLUCOSE Supplies Needed  Blood glucose meter.  Test strips for your meter. Each meter has its own strips. You must use the strips that go with your own meter.  A pricking needle (lancet).  A device that holds the lancet (lancing device).  A journal or log book to write down your results. Procedure  Wash your hands with soap and water. Alcohol is not preferred.  Prick the side of your finger (not the tip) with the lancet.  Gently milk the finger until a small drop of blood appears.  Follow the instructions that come with your meter for inserting the test strip, applying blood to the strip, and using your blood glucose meter. Other Areas to Get Blood for Testing Some meters allow you to use other areas of your body (other than your finger) to test your blood. These areas are called alternative sites. The most common alternative sites are:  The forearm.  The thigh.  The back area of the lower leg.  The palm of the hand. The blood flow in these areas is slower. Therefore, the blood glucose  values you get may be delayed, and the numbers are different from what you would get from your fingers. Do not use alternative sites if you think you are having hypoglycemia. Your reading will not be accurate. Always use a finger if you are having hypoglycemia. Also, if you cannot feel your lows (hypoglycemia unawareness), always use your fingers for your blood glucose checks. ADDITIONAL TIPS FOR GLUCOSE MONITORING  Do not reuse lancets.  Always carry your supplies with you.  All blood glucose meters have a 24-hour "hotline" number to call if you have questions or need help.  Adjust  (calibrate) your blood glucose meter with a control solution after finishing a few boxes of strips. BLOOD GLUCOSE RECORD KEEPING It is a good idea to keep a daily record or log of your blood glucose readings. Most glucose meters, if not all, keep your glucose records stored in the meter. Some meters come with the ability to download your records to your home computer. Keeping a record of your blood glucose readings is especially helpful if you are wanting to look for patterns. Make notes to go along with the blood glucose readings because you might forget what happened at that exact time. Keeping good records helps you and your health care provider to work together to achieve good diabetes management.    This information is not intended to replace advice given to you by your health care provider. Make sure you discuss any questions you have with your health care provider.   Document Released: 08/02/2003 Document Revised: 08/20/2014 Document Reviewed: 12/22/2012 Elsevier Interactive Patient Education [employer].

## 2015-09-29 NOTE — Progress Notes (Signed)
    Subjective: CC: BP follow up HPI: Megan Khan is a 60 y.o. female presenting to clinic today for follow up. Concerns today include:  1. Hypertension Blood pressure today: 138/84 Meds: Compliant Norvasc, Lisinopril, Metoprolol ROS: Denies headache, visual changes, nausea, vomiting, chest pain, abdominal pain, edema or shortness of breath.  2. Diabetes:  Not testing because could not afford [place] off of insurance Taking medications: Metformin Side effects: occ feels dizzy, tired.  She thinks she might be having a low sugar.  This is relieved by orange juice. ROS: denies fever, chills, LOC, polyuria.  Endorses polydipsia and numbness or tingling in extremities or chest pain. Last eye exam: 09/2014.  Last appt was 09/2015.  Sees Dr Venetia Maxon.  She was prescribed an eye drop for dryness.  No retinopathy. Last foot exam: 03/2014 Last A1c: 7.1 (12/2014) Nephropathy screen indicated?: on ACE-I  3. Umbilical hernia Had repair done Nov 2016.  Healing well.  No post op issue.  Social History Reviewed: non smoker. FamHx and MedHx reviewed.  Please see EMR. Health Maintenance: Will schedule mammogram  ROS: Per HPI  Objective: Office vital signs reviewed. BP 138/84 mmHg  Pulse 62  Temp(Src) 98 F (36.7 C) (Oral)  Ht 5\' 1"  (1.549 m)  Wt 165 lb 9.6 oz (75.116 kg)  BMI 31.31 kg/m2  Physical Examination:  General: Awake, alert, well nourished, No acute distress Cardio: regular rate and rhythm, S1S2 heard, no murmurs appreciated Pulm: clear to auscultation bilaterally, no wheezes, rhonchi or rales, normal WOB on RA Extremities: warm, well perfused, No edema, cyanosis or clubbing; +2 pulses bilaterally, monofilament normal. No callous or ulcers. MSK: Normal gait and station  Assessment/ Plan: 60 y.o. female   HYPERTENSION, BENIGN SYSTEMIC Stable.   - Continue current medication regimen - Plan to make lifestyle changes - Follow up in 3 months.  Diabetes mellitus, type 2  (HCC) A1c elevated 9.0 from 7.1 <12 months ago. - DM2 foot exam performed.  WNL. - Had retinal exam 09/2015.  She is to call Dr Christy Gentles to have note sent over. - Continue Metformin - Check Glucose daily and if feeling hypo - Monitor and supplies rx'd today - Declined adding medication.  Discussed that this was AMA. - Lifestyle changes/ carb counting. - Follow up in 3 months.  Umbilical hernia S/p repair.  Healing well.  No issues.   Megan Norlander, DO PGY-2, [place]

## 2015-09-29 NOTE — Assessment & Plan Note (Signed)
Stable.   - Continue current medication regimen - Plan to make lifestyle changes - Follow up in 3 months.

## 2015-09-29 NOTE — Assessment & Plan Note (Signed)
S/p repair.  Healing well.  No issues.

## 2015-10-05 ENCOUNTER — Telehealth: Payer: Self-pay | Admitting: *Deleted

## 2015-10-05 NOTE — Telephone Encounter (Signed)
I will be happy to do this for patient.  She will need to bring the form/ have the form sent over with the patient portion completed and I will complete the rest and send it to the provided number.  Please let patient know this.

## 2015-10-05 NOTE — Telephone Encounter (Signed)
Patient states she needs a letter stating why she needs PCS services to help with her son Shea Evans (dob 09/24/2000) due to her glaucoma and the fact that she is legally blind. Also that she can do no heavy lifting. Needs this letter faxed to ATTN: [facility] Friendly and [facility] offices fax # [phone], states this needs to be done by 2/28. She states you can call her with any questions.

## 2015-10-06 ENCOUNTER — Encounter: Payer: Self-pay | Admitting: Family Medicine

## 2015-10-06 NOTE — Telephone Encounter (Signed)
She needs Dr to write a letter. There is no form to fill out. Ottis Stain, CMA

## 2015-10-06 NOTE — Telephone Encounter (Signed)
Done.  Durene Cal to front office staff to fax to number provided.

## 2015-10-17 ENCOUNTER — Other Ambulatory Visit: Payer: Self-pay

## 2015-10-17 DIAGNOSIS — Z1231 Encounter for screening mammogram for malignant neoplasm of breast: Secondary | ICD-10-CM

## 2015-10-27 ENCOUNTER — Ambulatory Visit
Admission: RE | Admit: 2015-10-27 | Discharge: 2015-10-27 | Disposition: A | Discharge status: Home / Self Care | Payer: Medicaid Other | Source: Ambulatory Visit

## 2015-10-27 DIAGNOSIS — Z1231 Encounter for screening mammogram for malignant neoplasm of breast: Secondary | ICD-10-CM

## 2015-12-01 ENCOUNTER — Telehealth: Payer: Self-pay | Admitting: Family Medicine

## 2015-12-01 NOTE — Telephone Encounter (Signed)
Need to sign the form faxed from [facility], then fax back to them.

## 2015-12-08 NOTE — Telephone Encounter (Signed)
This is being taken care of under pt son chart. Megan Khan, Megan Khan, [name]

## 2016-01-30 ENCOUNTER — Ambulatory Visit (INDEPENDENT_AMBULATORY_CARE_PROVIDER_SITE_OTHER): Payer: Medicaid Other | Admitting: Family Medicine

## 2016-01-30 ENCOUNTER — Encounter: Payer: Self-pay | Admitting: Family Medicine

## 2016-01-30 VITALS — BP 114/80 | HR 57 | Temp 98.2°F | Ht 61.0 in | Wt 155.2 lb

## 2016-01-30 DIAGNOSIS — Z114 Encounter for screening for human immunodeficiency virus [HIV]: Secondary | ICD-10-CM

## 2016-01-30 DIAGNOSIS — I10 Essential (primary) hypertension: Secondary | ICD-10-CM | POA: Diagnosis present

## 2016-01-30 DIAGNOSIS — H4010X3 Unspecified open-angle glaucoma, severe stage: Secondary | ICD-10-CM | POA: Diagnosis not present

## 2016-01-30 DIAGNOSIS — E1122 Type 2 diabetes mellitus with diabetic chronic kidney disease: Secondary | ICD-10-CM

## 2016-01-30 DIAGNOSIS — H60543 Acute eczematoid otitis externa, bilateral: Secondary | ICD-10-CM

## 2016-01-30 DIAGNOSIS — E785 Hyperlipidemia, unspecified: Secondary | ICD-10-CM | POA: Diagnosis not present

## 2016-01-30 DIAGNOSIS — Z1159 Encounter for screening for other viral diseases: Secondary | ICD-10-CM

## 2016-01-30 DIAGNOSIS — N181 Chronic kidney disease, stage 1: Secondary | ICD-10-CM | POA: Diagnosis not present

## 2016-01-30 LAB — COMPLETE METABOLIC PANEL WITH GFR
ALBUMIN: 4.3 g/dL (ref 3.6–5.1)
ALK PHOS: 62 U/L (ref 33–130)
ALT: 14 U/L (ref 6–29)
AST: 14 U/L (ref 10–35)
BUN: 16 mg/dL (ref 7–25)
CALCIUM: 9.5 mg/dL (ref 8.6–10.4)
CO2: 25 mmol/L (ref 20–31)
CREATININE: 0.79 mg/dL (ref 0.50–1.05)
Chloride: 107 mmol/L (ref 98–110)
GFR, Est African American: >89 mL/min (ref >=60)
GFR, Est Non African American: 82 mL/min (ref >=60)
GLUCOSE: 92 mg/dL (ref 65–99)
POTASSIUM: 3.4 mmol/L — AB (ref 3.5–5.3)
SODIUM: 141 mmol/L (ref 135–146)
Total Bilirubin: 0.4 mg/dL (ref 0.2–1.2)
Total Protein: 7.1 g/dL (ref 6.1–8.1)

## 2016-01-30 LAB — LIPID PANEL
CHOL/HDL RATIO: 2.8 ratio (ref <=5.0)
Cholesterol: 147 mg/dL (ref 125–200)
HDL: 52 mg/dL (ref >=46)
LDL Cholesterol: 73 mg/dL (ref <130)
Triglycerides: 111 mg/dL (ref <150)
VLDL: 22 mg/dL (ref <30)

## 2016-01-30 LAB — HIV ANTIBODY (ROUTINE TESTING W REFLEX): HIV 1&2 Ab, 4th Generation: NONREACTIVE

## 2016-01-30 LAB — POCT GLYCOSYLATED HEMOGLOBIN (HGB A1C): Hemoglobin A1C: 7

## 2016-01-30 LAB — HEPATITIS C ANTIBODY: HCV AB: NEGATIVE

## 2016-01-30 NOTE — Patient Instructions (Signed)
You can place a little mineral oil on the tip of your finger and apply to each ear canal one to two times daily for the next couple of weeks.  AVOID using hair pins, etc to scratch your ears.  Your A1c has improved.  Continue taking your medications as directed.  I will contact you will the results of your labs.  If anything is abnormal, I will call you.  Otherwise, expect a copy to be mailed to you.  We will plan to see eachother again in the next couple of months for your annual exam.  Otitis Externa Otitis externa is a bacterial or fungal infection of the outer ear canal. This is the area from the eardrum to the outside of the ear. Otitis externa is sometimes called "swimmer's ear." CAUSES  Possible causes of infection include:  Swimming in dirty water.  Moisture remaining in the ear after swimming or bathing.  Mild injury (trauma) to the ear.  Objects stuck in the ear (foreign body).  Cuts or scrapes (abrasions) on the outside of the ear. SIGNS AND SYMPTOMS  The first symptom of infection is often itching in the ear canal. Later signs and symptoms may include swelling and redness of the ear canal, ear pain, and yellowish-white fluid (pus) coming from the ear. The ear pain may be worse when pulling on the earlobe. PREVENTION   Keep your ear dry. Use the corner of a towel to absorb water out of the ear canal after swimming or bathing.  Avoid scratching or putting objects inside your ear. This can damage the ear canal or remove the protective wax that lines the canal. This makes it easier for bacteria and fungi to grow.  Avoid swimming in lakes, polluted water, or poorly chlorinated pools.  You may use ear drops made of rubbing alcohol and vinegar after swimming. Combine equal parts of white vinegar and alcohol in a bottle. Put 3 or 4 drops into each ear after swimming. SEEK MEDICAL CARE IF:   You have a fever.  Your ear is still red, swollen, painful, or draining pus after 3  days.  Your redness, swelling, or pain gets worse.  You have a severe headache.  You have redness, swelling, pain, or tenderness in the area behind your ear. MAKE SURE YOU:   Understand these instructions.  Will watch your condition.  Will get help right away if you are not doing well or get worse.   This information is not intended to replace advice given to you by your health care provider. Make sure you discuss any questions you have with your health care provider.   Document Released: 07/30/2005 Document Revised: 08/20/2014 Document Reviewed: 08/16/2011 Elsevier Interactive Patient Education [employer].

## 2016-01-30 NOTE — Progress Notes (Signed)
Subjective: CC: DM follow up HPI: Megan Khan is a 60 y.o. female presenting to clinic today for follow up. Concerns today include:  1. Diabetes:  High at home: 133 Low at home: 88 Taking medications: Metformin 1000 mg BID. ROS: denies fever, chills, dizziness, LOC, polyuria, polydipsia, numbness or tingling in extremities or chest pain. Last eye exam: 09/2015 Last foot exam: >1 year Last A1c: 09/2015 (9.0) Nephropathy screen indicated?: on ACEi  2. Hypertension Blood pressure today: 114/80 Meds: Compliant with Lopressor, Norvasc, Lisinopril/HCTZ ROS: Denies headache, dizziness, visual changes (has difficulty seeing at baseline but no worsening), nausea, vomiting, chest pain, abdominal pain or shortness of breath.  3. Open angle glaucoma Possible surgery right eye for increased ocular pressures by Dr Venetia Maxon on [facility].  Patient notes that she feels like her vision is actually improving.  She denies ocular pain.  Social History Reviewed: non smoker. FamHx and MedHx reviewed.  Please see EMR. Health Maintenance: Hep C, HIV screen  ROS: Per HPI  Objective: Office vital signs reviewed. BP 114/80 mmHg  Pulse 57  Temp(Src) 98.2 F (36.8 C) (Oral)  Ht 5\' 1"  (1.549 m)  Wt 155 lb 3.2 oz (70.398 kg)  BMI 29.34 kg/m2  Physical Examination:  General: Awake, alert, well nourished, No acute distress HEENT: Normal    Neck: No masses palpated. No lymphadenopathy    Ears: Tympanic membranes intact, dulled light reflex bilaterally, no erythema, no bulging, +sloughing of skin in external ear.    Eyes: PERRLA (RIGHT eye), Left eye does not constrict to light (chronic per patient, she does not have vision in left eye), EOMI, wears glasses Cardio: regular rate and rhythm, S1S2 heard, no murmurs appreciated Pulm: clear to auscultation bilaterally, no wheezes, rhonchi or rales, normal WOB on room air. MSK: Normal gait and station  Diabetic Foot Form - Detailed   Diabetic Foot  Exam - detailed  Diabetic Foot exam was performed with the following findings:  Yes 01/30/2016 11:39 AM  Visual Foot Exam completed.:  Yes  Is there a history of foot ulcer?:  No  Can the patient see the bottom of their feet?:  Yes  Are the shoes appropriate in style and fit?:  Yes  Is there swelling or and abnormal foot shape?:  Yes (Comment: has bunions bilaterally)  Are the toenails long?:  No  Are the toenails thick?:  No  Do you have pain in calf while walking?:  No  Is there a claw toe deformity?:  No  Is there elevated skin temparature?:  No  Is there limited skin dorsiflexion?:  No  Is there foot or ankle muscle weakness?:  No  Are the toenails ingrown?:  No  Normal Range of Motion:  Yes    Pulse Foot Exam completed.:  Yes  Right posterior Tibialias:  Present Left posterior Tibialias:  Present  Right Dorsalis Pedis:  Present Left Dorsalis Pedis:  Present  Sensory Foot Exam Completed.:  Yes  Swelling:  No  Semmes-Weinstein Monofilament Test    Comments:  Monofilament in tact through out.      Results for orders placed or performed in visit on 01/30/16 (from the past 24 hour(s))  HgB A1c     Status: None   Collection Time: 01/30/16 10:49 AM  Result Value Ref Range   Hemoglobin A1C 7.0    Assessment/ Plan: 60 y.o. female   1. HYPERTENSION, BENIGN SYSTEMIC, Controlled.  Will obtain fasting labs today in preparation for her annual  exam. - Continue current medications. - COMPLETE METABOLIC PANEL WITH GFR - Lipid panel  2. Type 2 diabetes mellitus with stage 1 chronic kidney disease, unspecified long term insulin use status ([place]).  A1c greatly improved from previous in 09/2015. - Continue Metformin 1000 mg BID  - Foot exam normal.  NO lesions appreciated. - COMPLETE METABOLIC PANEL WITH GFR - Lipid panel - ROI to Dr Venetia Maxon filled out to obtain DM retinal exam results.  3. Dermatitis of ear canal, bilateral - Recommend Mineral oil applied to both ears 1-2 times  daily - Avoid scratching inner ear with hair pins, etc - Discussed return precautions - If persistent itching despite treatment in 3-4 days, will call in ear drop.  Patient to call if issues.  4. Hyperlipidemia - Continue Lovastatin - Lipid panel  5. Open-angle glaucoma of both eyes, severe stage, unspecified open-angle glaucoma type - To see ophthalmologist soon, possible surgery in right eye - Discussed to call and set up pre-op clearance appt if needed  6. Need for hepatitis C screening test - Hepatitis C antibody  7. Screening for HIV (human immunodeficiency virus) - HIV antibody   [name], DO PGY-2, [place]

## 2016-01-31 ENCOUNTER — Encounter: Payer: Self-pay | Admitting: Family Medicine

## 2016-02-22 ENCOUNTER — Ambulatory Visit (INDEPENDENT_AMBULATORY_CARE_PROVIDER_SITE_OTHER): Payer: Medicaid Other | Admitting: Family Medicine

## 2016-02-22 ENCOUNTER — Encounter: Payer: Self-pay | Admitting: Family Medicine

## 2016-02-22 VITALS — BP 119/80 | HR 80 | Temp 98.3°F | Wt 152.2 lb

## 2016-02-22 DIAGNOSIS — Z0000 Encounter for general adult medical examination without abnormal findings: Secondary | ICD-10-CM | POA: Diagnosis present

## 2016-02-22 DIAGNOSIS — L918 Other hypertrophic disorders of the skin: Secondary | ICD-10-CM | POA: Diagnosis not present

## 2016-02-22 DIAGNOSIS — Z23 Encounter for immunization: Secondary | ICD-10-CM | POA: Diagnosis not present

## 2016-02-22 DIAGNOSIS — L089 Local infection of the skin and subcutaneous tissue, unspecified: Secondary | ICD-10-CM | POA: Diagnosis not present

## 2016-02-22 NOTE — Patient Instructions (Signed)
Your labs look great.  If you would like those skin tags removed, schedule an appointment and we can take care of them.  They look benign.  Have Dr Venetia Maxon send your eye exams over to me when he gets back.

## 2016-02-22 NOTE — Progress Notes (Signed)
Megan Khan is a 60 y.o. female presents to office today for annual physical exam examination.  Concerns today include:  Skin tag under right armpit.  Notes that she has had these in the past and had removed in office.  She is interested in potentially getting this one removed, as it catches on her clothing.  Last eye exam: Megan Khan 01/2016 Last dental exam: 2017 Last colonoscopy: 01/2012 Last mammogram: 10/2015 Last pap smear: 12/2013, neg Immunizations needed: TDap, PNA  Past Medical History  Diagnosis Date  . Hypertension   . Diabetes mellitus   . Hyperlipidemia   . Hx of colonic polyps    Social History   Social History  . Marital Status: Single    Spouse Name: N/A  . Number of Children: N/A  . Years of Education: N/A   Occupational History  . Not on file.   Social History Main Topics  . Smoking status: Former Smoker    Types: Cigarettes    Quit date: 09/23/1995  . Smokeless tobacco: Not on file  . Alcohol Use: No  . Drug Use: No  . Sexual Activity: Not on file   Other Topics Concern  . Not on file   Social History Narrative   Past Surgical History  Procedure Laterality Date  . Tubal ligation    . Colonoscopy  01/22/2012    Procedure: COLONOSCOPY;  Surgeon: Megan Horner, MD;  Location: [facility];  Service: Endoscopy;  Laterality: N/A;  . Colonoscopy    . Colonoscopy     History reviewed. No pertinent family history.  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 Ears, nose, mouth, throat, and face: negative Respiratory: negative Cardiovascular: negative Gastrointestinal: negative Genitourinary:negative Integument/breast: negative Hematologic/lymphatic: negative Musculoskeletal:negative Neurological: negative Behavioral/Psych: negative Endocrine: negative Allergic/Immunologic: negative   Physical exam BP 119/80 mmHg  Pulse 80  Temp(Src) 98.3 F (36.8 C) (Oral)  Wt 152 lb 3.2 oz  (69.037 kg)  SpO2 100% General appearance: alert, cooperative, appears stated age and no distress Head: Normocephalic, without obvious abnormality, atraumatic Eyes: changes consistent with glaucoma in bilateral eyes, 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 Septum midline. Mucosa normal. No drainage or sinus tenderness. Throat: lips, mucosa, and tongue normal; teeth and gums normal Neck: no adenopathy, supple, symmetrical, trachea midline and thyroid not enlarged, symmetric, no tenderness/mass/nodules Back: symmetric, no curvature. ROM normal. No CVA tenderness. Lungs: clear to auscultation bilaterally Heart: regular rate and rhythm, S1, S2 normal, no murmur, click, rub or gallop Abdomen: soft, non-tender; bowel sounds normal; no masses,  no organomegaly Extremities: extremities normal, atraumatic, no cyanosis or edema Pulses: 2+ and symmetric Skin: Skin color, texture, turgor normal. No rashes or lesions or irritated skin tag under left axilla, multiple benign appearing nevi along bilateral cheeks Lymph nodes: Cervical, supraclavicular, and axillary nodes normal. Neurologic: Alert and oriented X 3, normal strength and tone. Normal symmetric reflexes. Normal coordination and gait Blind in left eye  Depression screen Megan Khan 2/9 02/22/2016  Decreased Interest 0  Down, Depressed, Hopeless 0  PHQ - 2 Score 0    Assessment/ Plan:  1. Annual physical exam. Doing well,  No concern except skin tag today - History reviewed and updated - Immunizations updated - Labs reviewed with patient.  No abnormalities - Will attempt to get eye exam from Megan Khan again, but it appears that he has been on leave  2. Inflamed  skin tag - Discussed removal in office - Patient to schedule appt  3. Immunization due - Tdap vaccine greater than or equal to 7yo IM - Pneumococcal polysaccharide vaccine 23-valent greater than or equal to 2yo  subcutaneous/IM  Follow up prn or in 1 year for annual exam.  Megan M. Lajuana Ripple, DO PGY-2, [place]

## 2016-02-29 ENCOUNTER — Other Ambulatory Visit: Payer: Self-pay | Admitting: Family Medicine

## 2016-03-14 ENCOUNTER — Ambulatory Visit: Payer: Medicaid Other | Admitting: Family Medicine

## 2016-04-04 ENCOUNTER — Ambulatory Visit: Payer: Medicaid Other | Admitting: Family Medicine

## 2016-04-10 ENCOUNTER — Other Ambulatory Visit: Payer: Self-pay | Admitting: Family Medicine

## 2016-04-10 DIAGNOSIS — E1169 Type 2 diabetes mellitus with other specified complication: Secondary | ICD-10-CM

## 2016-04-10 DIAGNOSIS — E785 Hyperlipidemia, unspecified: Principal | ICD-10-CM

## 2016-07-14 ENCOUNTER — Other Ambulatory Visit: Payer: Self-pay | Admitting: Family Medicine

## 2016-07-14 DIAGNOSIS — E785 Hyperlipidemia, unspecified: Principal | ICD-10-CM

## 2016-07-14 DIAGNOSIS — E1169 Type 2 diabetes mellitus with other specified complication: Secondary | ICD-10-CM

## 2016-07-31 ENCOUNTER — Telehealth: Payer: Self-pay | Admitting: Family Medicine

## 2016-08-08 ENCOUNTER — Encounter: Payer: Self-pay | Admitting: Family Medicine

## 2016-08-08 ENCOUNTER — Ambulatory Visit (INDEPENDENT_AMBULATORY_CARE_PROVIDER_SITE_OTHER): Payer: Medicaid Other | Admitting: Family Medicine

## 2016-08-08 VITALS — BP 106/72 | HR 56 | Temp 98.7°F | Ht 61.0 in | Wt 157.2 lb

## 2016-08-08 DIAGNOSIS — Z23 Encounter for immunization: Secondary | ICD-10-CM

## 2016-08-08 DIAGNOSIS — E1122 Type 2 diabetes mellitus with diabetic chronic kidney disease: Secondary | ICD-10-CM

## 2016-08-08 DIAGNOSIS — N181 Chronic kidney disease, stage 1: Secondary | ICD-10-CM | POA: Diagnosis not present

## 2016-08-08 LAB — POCT GLYCOSYLATED HEMOGLOBIN (HGB A1C): HEMOGLOBIN A1C: 6.2

## 2016-08-08 MED ORDER — METFORMIN HCL 1000 MG PO TABS: 1000.0000 mg | ORAL_TABLET | Freq: Two times a day (BID) | ORAL | 6 refills | Status: DC

## 2016-08-08 NOTE — Patient Instructions (Signed)
Your A1c looks awesome!  Keep up the good work.  I will attempt to get your eye exam from Dr Venetia Maxon.  Jonathan's shoes have been sent to [facility].  Follow up with me in June or sooner if needed.

## 2016-08-08 NOTE — Progress Notes (Signed)
    Subjective: CC: DM2 HPI: Megan Khan is a 60 y.o. female presenting to clinic today for follow up visit. Concerns today include:  1. Diabetes:  High at home: 220 Fasting BG: 67 Low at home: 56 Taking medications: Metformin 1000 BID, Lovastatin 20mg  daily ROS: denies fever, chills, dizziness, LOC, polyuria, polydipsia, numbness or tingling in extremities or chest pain. Last eye exam: 07/2016, Dr Venetia Maxon Last foot exam: 01/2016 Last A1c: 7.0 (01/2016) Nephropathy screen indicated?: on ACE-I Last flu, zoster and/or pneumovax: flu needed  Social History Reviewed: non smoker. FamHx and MedHx reviewed.  Please see EMR. Health Maintenance: flu shot needed, needs to obtain eye exam information  ROS: Per HPI  Objective: Office vital signs reviewed. 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 Cardio: regular rate and rhythm, S1S2 heard, no murmurs appreciated Pulm: clear to auscultation bilaterally, no wheezes, rhonchi or rales; normal work of breathing on room air Ext: WWP, no edema  Results for orders placed or performed in visit on 08/08/16 (from the past 24 hour(s))  HgB A1c     Status: None   Collection Time: 08/08/16  9:34 AM  Result Value Ref Range   Hemoglobin A1C 6.2    Assessment/ Plan: 60 y.o. female   Diabetes mellitus, type 2 ([place]) Well controlled.  A1c down to 6.2.  [name].  If continues to have intermittent lows, will consider backing off on Metformin to 1000mg  qam and 850mg  qpm.  Will obtain DM eye exam from Dr Venetia Maxon, now that he is back in office.  Flu shot also administered today.  Follow up in 6 months for annual exam or sooner if needed  Need for prophylactic  vaccination and inoculation against influenza Flu shot administered.   [name], DO PGY-3, [facility]

## 2016-08-08 NOTE — Assessment & Plan Note (Signed)
Well controlled.  A1c down to 6.2.  [name].  If continues to have intermittent lows, will consider backing off on Metformin to 1000mg  qam and 850mg  qpm.  Will obtain DM eye exam from Dr Venetia Maxon, now that he is back in office.  Flu shot also administered today.  Follow up in 6 months for annual exam or sooner if needed

## 2016-08-10 ENCOUNTER — Telehealth: Payer: Self-pay | Admitting: Family Medicine

## 2016-08-10 NOTE — Telephone Encounter (Signed)
-----   Message from Megan Norlander, DO sent at 08/08/2016  9:49 AM EST ----- Regarding: DM eye exam Dr Venetia Maxon, [facility]

## 2016-08-10 NOTE — Telephone Encounter (Signed)
I have requested a DM eye exam report from Dr. [name]. Their office will fax the report to our office.    [place]

## 2016-10-11 ENCOUNTER — Telehealth: Payer: Self-pay | Admitting: Family Medicine

## 2016-10-11 NOTE — Telephone Encounter (Signed)
Pt states the state needs paperwork from PCP regarding the aid that comes every morning. Pt would like a nurse to call her back. ep

## 2016-10-18 NOTE — Telephone Encounter (Signed)
Letter faxed and Ms Panos called. See [name] Smith's chart for more information. Ottis Stain, CMA

## 2016-11-29 ENCOUNTER — Other Ambulatory Visit: Payer: Self-pay | Admitting: Family Medicine

## 2016-11-29 DIAGNOSIS — Z1231 Encounter for screening mammogram for malignant neoplasm of breast: Secondary | ICD-10-CM

## 2016-12-05 ENCOUNTER — Ambulatory Visit
Admission: RE | Admit: 2016-12-05 | Discharge: 2016-12-05 | Disposition: A | Discharge status: Home / Self Care | Payer: Medicaid Other | Source: Ambulatory Visit | Attending: Family Medicine | Admitting: Family Medicine

## 2016-12-05 DIAGNOSIS — Z1231 Encounter for screening mammogram for malignant neoplasm of breast: Secondary | ICD-10-CM

## 2016-12-19 ENCOUNTER — Other Ambulatory Visit (INDEPENDENT_AMBULATORY_CARE_PROVIDER_SITE_OTHER): Payer: Medicaid Other

## 2016-12-19 DIAGNOSIS — N181 Chronic kidney disease, stage 1: Secondary | ICD-10-CM

## 2016-12-19 DIAGNOSIS — E1122 Type 2 diabetes mellitus with diabetic chronic kidney disease: Secondary | ICD-10-CM | POA: Diagnosis present

## 2016-12-19 LAB — POCT GLYCOSYLATED HEMOGLOBIN (HGB A1C): Hemoglobin A1C: 6.6

## 2017-01-22 ENCOUNTER — Other Ambulatory Visit: Payer: Self-pay | Admitting: Family Medicine

## 2017-02-12 ENCOUNTER — Other Ambulatory Visit: Payer: Self-pay | Admitting: Family Medicine

## 2017-02-14 ENCOUNTER — Other Ambulatory Visit: Payer: Self-pay | Admitting: Family Medicine

## 2017-02-17 ENCOUNTER — Other Ambulatory Visit: Payer: Self-pay | Admitting: Family Medicine

## 2017-02-18 NOTE — Telephone Encounter (Signed)
Pt informed. Fleeger, Jessica Dawn, CMA  

## 2017-03-05 ENCOUNTER — Other Ambulatory Visit: Payer: Self-pay | Admitting: Family Medicine

## 2017-03-06 ENCOUNTER — Other Ambulatory Visit: Payer: Self-pay | Admitting: Family Medicine

## 2017-03-06 DIAGNOSIS — I10 Essential (primary) hypertension: Secondary | ICD-10-CM

## 2017-03-06 MED ORDER — METOPROLOL TARTRATE 50 MG PO TABS: 50.0000 mg | ORAL_TABLET | Freq: Two times a day (BID) | ORAL | 3 refills | Status: DC

## 2017-03-06 NOTE — Telephone Encounter (Signed)
Patient calling to request refill of:  Name of Medication(s): metoprolol  Last date of OV:  08/08/2016 Pharmacy:  [facility]  RX has been going to [employer], Dr. Marjean Donna new office and being denied.   Will route refill request to Clinic RN.  Discussed with patient policy to call pharmacy for future refills.  Also, discussed refills may take up to 48 hours to approve or deny.  Megan Khan

## 2017-03-19 ENCOUNTER — Other Ambulatory Visit (INDEPENDENT_AMBULATORY_CARE_PROVIDER_SITE_OTHER): Payer: Medicaid Other

## 2017-03-19 DIAGNOSIS — N181 Chronic kidney disease, stage 1: Secondary | ICD-10-CM

## 2017-03-19 DIAGNOSIS — E1122 Type 2 diabetes mellitus with diabetic chronic kidney disease: Secondary | ICD-10-CM | POA: Diagnosis present

## 2017-03-19 LAB — POCT GLYCOSYLATED HEMOGLOBIN (HGB A1C): Hemoglobin A1C: 7

## 2017-04-19 ENCOUNTER — Ambulatory Visit (INDEPENDENT_AMBULATORY_CARE_PROVIDER_SITE_OTHER): Payer: Medicaid Other | Admitting: *Deleted

## 2017-04-19 DIAGNOSIS — Z23 Encounter for immunization: Secondary | ICD-10-CM | POA: Diagnosis not present

## 2017-05-08 ENCOUNTER — Other Ambulatory Visit: Payer: Self-pay | Admitting: Family Medicine

## 2017-05-10 ENCOUNTER — Other Ambulatory Visit: Payer: Self-pay | Admitting: Family Medicine

## 2017-05-11 ENCOUNTER — Other Ambulatory Visit: Payer: Self-pay | Admitting: Family Medicine

## 2017-07-18 ENCOUNTER — Ambulatory Visit: Payer: Medicaid Other | Admitting: Family Medicine

## 2017-07-18 VITALS — BP 125/65 | HR 67 | Temp 97.6°F | Ht 62.0 in | Wt 158.4 lb

## 2017-07-18 DIAGNOSIS — M21611 Bunion of right foot: Secondary | ICD-10-CM | POA: Diagnosis present

## 2017-07-18 DIAGNOSIS — M19011 Primary osteoarthritis, right shoulder: Secondary | ICD-10-CM | POA: Insufficient documentation

## 2017-07-18 DIAGNOSIS — M19012 Primary osteoarthritis, left shoulder: Secondary | ICD-10-CM

## 2017-07-18 DIAGNOSIS — M5416 Radiculopathy, lumbar region: Secondary | ICD-10-CM

## 2017-07-18 DIAGNOSIS — M25511 Pain in right shoulder: Secondary | ICD-10-CM | POA: Diagnosis not present

## 2017-07-18 NOTE — Patient Instructions (Addendum)
It was a pleasure to see you today! Thank you for choosing [facility] for your primary care. Megan Khan was seen for R sided radiculopathy and R shoulder pain w/ bunion on R great toe. Come back to the clinic if your symptoms in back/shoulder do not improve in 1 month, and go to the emergency room if you develop any loss of sensation or life threatening symptoms.   Please continue conservative management (ice/stretching/no heavy lifting).   If the pain does not resolve we can discuss a physical therapy referal.  If we did any lab work today, and the results require attention, either me or my nurse will get in touch with you. If everything is normal, you will get a letter in mail and a message via . If you don't hear from [place] in two weeks, please give [place] a call. Otherwise, we look forward to seeing you again at your next visit. If you have any questions or concerns before then, please call the clinic at [phone].  Please bring all your medications to every doctors visit  Sign up for My Chart to have easy access to your labs results, and communication with your Primary care physician.    Please check-out at the front desk before leaving the clinic.    Best,  Dr. [name] FAMILY MEDICINE RESIDENT - PGY1 07/18/2017 11:32 AM

## 2017-07-18 NOTE — Assessment & Plan Note (Signed)
~  4wks, no sign of incontinence/saddle anesthesia/trauma.   Tolerable on ibuprofen/ice.   Will recommend consertaive ice/ibuprofen/stretching for 1 month.   Will consider PT if not resolving by that point

## 2017-07-18 NOTE — Assessment & Plan Note (Signed)
~  4wks in duration, likely due to lifting grandchildren.   Conservative management w/ ice/stretching/light use as tolerated

## 2017-07-18 NOTE — Progress Notes (Signed)
    Subjective:  Megan Khan is a 61 y.o. female who presents to the [facility] today with a chief complaint of R shoulder pain and lumbar radiculopathy.   HPI: Pain has been for 1 month.   No specific trauma noted, no new activity level beyond playing/lifting with ~35lb grandchild.   She lifts them a lot.    Should pain is radiating from neck to shoulder and is sharp and positional.   Back pain radiates sharply from lower back to R buttock and around lateral thigh to foot.  Pain is tolerable with ibuprofen and ice.  Is not requesting narcotic pain meds.   No hx of IV drug use, no loss of sensation, no muscle weakness/wasting, no incontinence/saddle anesthia, no fever symptoms, SOB, chest pain, abdominal pain.  Objective:  Physical Exam: BP 125/65 (BP Location: Left Arm, Patient Position: Sitting, Cuff Size: Normal)   Pulse 67   Temp 97.6 F (36.4 C) (Oral)   Ht 5\' 2"  (1.575 m)   Wt 71.8 kg (158 lb 6.4 oz)   SpO2 98%   BMI 28.97 kg/m   Gen: NAD, resting comfortably CV: RRR with no murmurs appreciated Pulm: NWOB, CTAB with no crackles, wheezes, or rhonchi GI: Normal bowel sounds present. Soft, Nontender, Nondistended. MSK: no edema, cyanosis, or clubbing noted Skin: warm, dry.  Skin intact over large bunion on R great toe.   Toe is disoriented but function with cap refill/sensation intact.  Neuro: grossly normal, moves all extremities Psych: Normal affect and thought content  No results found for this or any previous visit (from the past 72 hour(s)).   Assessment/Plan:  Acute pain of right shoulder ~4wks in duration, likely due to lifting grandchildren.   Conservative management w/ ice/stretching/light use as tolerated  Bunion of great toe of right foot Large and painful but skin intact.   Great toe is disoriented.  Painful in shoe, will refer to podiatry  Lumbar radiculopathy ~4wks, no sign of incontinence/saddle anesthesia/trauma.   Tolerable on ibuprofen/ice.   Will recommend  consertaive ice/ibuprofen/stretching for 1 month.   Will consider PT if not resolving by that point   [name], [place] - PGY1 07/18/2017 5:07 PM

## 2017-07-18 NOTE — Assessment & Plan Note (Signed)
Large and painful but skin intact.   Great toe is disoriented.  Painful in shoe, will refer to podiatry

## 2017-08-12 ENCOUNTER — Other Ambulatory Visit: Payer: Self-pay | Admitting: Family Medicine

## 2017-08-12 DIAGNOSIS — E1169 Type 2 diabetes mellitus with other specified complication: Secondary | ICD-10-CM

## 2017-08-12 DIAGNOSIS — E785 Hyperlipidemia, unspecified: Principal | ICD-10-CM

## 2017-08-15 ENCOUNTER — Telehealth: Payer: Self-pay | Admitting: Family Medicine

## 2017-08-15 ENCOUNTER — Other Ambulatory Visit: Payer: Self-pay | Admitting: Family Medicine

## 2017-08-15 DIAGNOSIS — E1169 Type 2 diabetes mellitus with other specified complication: Secondary | ICD-10-CM

## 2017-08-15 DIAGNOSIS — E785 Hyperlipidemia, unspecified: Principal | ICD-10-CM

## 2017-08-15 NOTE — Telephone Encounter (Signed)
Pt called back and said her pharmacy said her amlodipine and lisinopril was refilled, but [facility] didn't refill her metformin and lovastatin. She also needs those sent to her pharmacy because she is not completely out. Please advise

## 2017-08-15 NOTE — Telephone Encounter (Signed)
Pt called and said her pharmacy told her she is out of refills for all of her meds. Pharmacy gave her emergency samples of most of her meds on Friday, but that was only a 3 day supply. She is completely out of all her meds now.

## 2017-09-27 ENCOUNTER — Encounter: Payer: Self-pay | Admitting: Podiatry

## 2017-09-27 ENCOUNTER — Ambulatory Visit (INDEPENDENT_AMBULATORY_CARE_PROVIDER_SITE_OTHER): Payer: Medicaid Other

## 2017-09-27 ENCOUNTER — Ambulatory Visit: Payer: Medicaid Other | Admitting: Podiatry

## 2017-09-27 VITALS — BP 140/87 | HR 62 | Ht 61.5 in | Wt 158.0 lb

## 2017-09-27 DIAGNOSIS — M779 Enthesopathy, unspecified: Secondary | ICD-10-CM

## 2017-09-27 DIAGNOSIS — M216X1 Other acquired deformities of right foot: Secondary | ICD-10-CM | POA: Diagnosis not present

## 2017-09-27 DIAGNOSIS — M21611 Bunion of right foot: Secondary | ICD-10-CM | POA: Diagnosis not present

## 2017-09-27 DIAGNOSIS — M21612 Bunion of left foot: Secondary | ICD-10-CM

## 2017-09-27 DIAGNOSIS — M2040 Other hammer toe(s) (acquired), unspecified foot: Secondary | ICD-10-CM | POA: Diagnosis not present

## 2017-09-27 NOTE — Progress Notes (Signed)
   Subjective:    Patient ID: Megan Khan, female    DOB: Jan 26, 1956, 62 y.o.   MRN: [id number]  HPI  Chief Complaint  Patient presents with  . Diabetes    Diabetic foot exam - numbness and tingling in feet and legs bilaterally  . Bunions    Bilateral - runs in family - 3 siblings have had corrective surgery       Review of Systems  Musculoskeletal: Positive for gait problem and myalgias.  All other systems reviewed and are negative.      Objective:   Physical Exam        Assessment & Plan:

## 2017-09-27 NOTE — Progress Notes (Signed)
Subjective:   Patient ID: Megan Khan, female   DOB: 62 y.o.   MRN: [id number]   HPI Patient presents with tingling in her feet that at times can be bothersome and history of bunion deformity.  Patient states her right leg is hurting and she is not sure if it is related to her feet.  Patient does not smoke and does like to be active   Review of Systems  All other systems reviewed and are negative.       Objective:  Physical Exam  Constitutional: She appears well-developed and well-nourished.  Cardiovascular: Intact distal pulses.  Pulmonary/Chest: Effort normal.  Musculoskeletal: Normal range of motion.  Neurological: She is alert.  Skin: Skin is warm.  Nursing note and vitals reviewed.   Neurovascular status was found to be intact muscle strength is adequate range of motion is mildly diminished but intact with no crepitus.  Patient does have structural bunion deformity noted and I did not note current diminishment of sharp dull or vibratory     Assessment:  Structural abnormality but I do not think is related to her leg pain with very mild neuropathic symptoms bilateral H&P and x-rays reviewed and today I     Plan:  Discussed the importance of keeping her sugar under control and using over-the-counter insoles.  Do not recommend any kind of surgery and the symptoms were to get worse  X-rays indicate structural deformity bilateral that is moderate in intensity with mild elevation of the lesser digits but not severe at the current time

## 2017-10-02 ENCOUNTER — Ambulatory Visit: Payer: Medicaid Other | Admitting: Family Medicine

## 2017-10-03 ENCOUNTER — Telehealth: Payer: Self-pay | Admitting: Family Medicine

## 2017-10-03 NOTE — Telephone Encounter (Signed)
Pt would like xrays of her right shoulder and right hip and thigh.  Her foot dr says the bunions on her foot are not causing her hip problem.  Please advise

## 2017-10-04 NOTE — Telephone Encounter (Signed)
She would need to come in and be evaluated.   It's likely physical therapy is a more appropriate first step but I would defer to the exam done by whatever resident she sees if they think xrays are appropriate.  Please notify her.   (Dr. Enid Derry will be handling my inbox as of today)

## 2017-10-07 NOTE — Telephone Encounter (Signed)
Appt on 10/09/17. Rudolph Daoust, Salome Spotted, CMA

## 2017-10-09 ENCOUNTER — Ambulatory Visit
Admission: RE | Admit: 2017-10-09 | Discharge: 2017-10-09 | Disposition: A | Discharge status: Home / Self Care | Payer: Medicaid Other | Source: Ambulatory Visit | Attending: Family Medicine | Admitting: Family Medicine

## 2017-10-09 ENCOUNTER — Other Ambulatory Visit: Payer: Self-pay

## 2017-10-09 ENCOUNTER — Encounter: Payer: Self-pay | Admitting: Internal Medicine

## 2017-10-09 ENCOUNTER — Ambulatory Visit: Payer: Medicaid Other | Admitting: Internal Medicine

## 2017-10-09 VITALS — BP 122/80 | HR 60 | Temp 98.2°F | Ht 62.0 in | Wt 152.8 lb

## 2017-10-09 DIAGNOSIS — N181 Chronic kidney disease, stage 1: Secondary | ICD-10-CM | POA: Diagnosis not present

## 2017-10-09 DIAGNOSIS — M25511 Pain in right shoulder: Secondary | ICD-10-CM | POA: Diagnosis not present

## 2017-10-09 DIAGNOSIS — M25551 Pain in right hip: Secondary | ICD-10-CM

## 2017-10-09 DIAGNOSIS — E1122 Type 2 diabetes mellitus with diabetic chronic kidney disease: Secondary | ICD-10-CM | POA: Diagnosis not present

## 2017-10-09 LAB — POCT GLYCOSYLATED HEMOGLOBIN (HGB A1C): Hemoglobin A1C: 6.9

## 2017-10-09 MED ORDER — GABAPENTIN 100 MG PO CAPS: 100.0000 mg | ORAL_CAPSULE | Freq: Three times a day (TID) | ORAL | 1 refills | Status: DC

## 2017-10-09 NOTE — Patient Instructions (Signed)
I have ordered imaging for your shoulder, hip, and low back. Please go to [facility] to have this done.   I have prescribed Gabapentin 100 mg three times a day for the pain shooting down your leg. Start by taking this at nighttime as some people find it to be sedating when they first start this medication.   I would recommend follow up with Dr. Criss Rosales in the next few weeks to discuss your pain, the imaging, the new medication, and next steps. Next steps may include physical therapy and/or shoulder injection.

## 2017-10-10 ENCOUNTER — Telehealth: Payer: Self-pay

## 2017-10-10 NOTE — Telephone Encounter (Signed)
Placed call to pt, no answer, no voicemail. Ottis Stain, CMA

## 2017-10-10 NOTE — Telephone Encounter (Signed)
Placed call to pt. No answer, no voicemail. Ottis Stain, CMA

## 2017-10-10 NOTE — Telephone Encounter (Signed)
-----   Message from Martin[place] Shirley, DO sent at 10/10/2017 12:04 PM EST ----- Please notify patient of normal results with some mild osteoarthritis seen.

## 2017-10-10 NOTE — Telephone Encounter (Signed)
-----   Message from Martinique Shirley, DO sent at 10/10/2017 12:03 PM EST ----- Please notify patient of normal results.

## 2017-10-10 NOTE — Telephone Encounter (Signed)
-----   Message from Martinique Shirley, DO sent at 10/10/2017 12:02 PM EST ----- Please notify patient of normal results.

## 2017-10-11 ENCOUNTER — Encounter: Payer: Self-pay | Admitting: Family Medicine

## 2017-10-14 ENCOUNTER — Telehealth: Payer: Self-pay | Admitting: *Deleted

## 2017-10-14 NOTE — Progress Notes (Addendum)
Subjective:    LASHANTI CHAMBLESS - 62 y.o. female MRN [id number]  Date of birth: 22-Mar-1956  HPI  EILIANA DRONE is here for right shoulder pain and right hip pain.  Shoulder Pain: Right sided. Chronic for a couple months. Mostly sharp in nature located over the top of her shoulder. Reports that pain is present with certain movements, particularly overhead movements and reaching around behind her back. No known injury or fall. Does have pain with lifting her grandchild. She has tried muscle rubs with only minimal relief. Never had any shoulder imaging done. No weakness of arm, no numbness/tingling, no skin color changes.   Right Hip Pain: Starts in her right low back/buttocks and radiates around the right hip and down the entire right leg to her foot. Pain in buttocks/hip is dull and achy. Pain radiating down leg is sharp and shooting. No weakness of her leg. No changes in bowel/bladder. No saddle anesthesia. No fevers. Takes NSAIDs without significant improvement in pain. She is able to tolerate lying on this side at night. Pain worsens in buttocks region with prolonged periods of sitting.   -  reports that she quit smoking about 22 years ago. Her smoking use included cigarettes. she has never used smokeless tobacco. - Review of Systems: Per HPI. - Past Medical History: Patient Active Problem List   Diagnosis Date Noted  . Acute pain of right shoulder 07/18/2017  . Lumbar radiculopathy 07/18/2017  . Bunion of great toe of right foot 07/18/2017  . Umbilical hernia 41/28/7867  . Post-menopausal bleeding 12/28/2013  . Polyp of colon, adenomatous 10/03/2011  . CATARACT, HX OF 06/28/2010  . Hyperlipidemia 01/26/2009  . OBESITY, UNSPECIFIED 11/22/2008  . Diabetes mellitus, type 2 ([place]) 06/23/2008  . Open-angle glaucoma of both eyes, severe stage 05/26/2008  . SYSTOLIC MURMUR 67/20/9470  . HYPERTENSION, BENIGN SYSTEMIC 10/10/2006   - Medications: reviewed and updated   Objective:   Physical Exam BP 122/80   Pulse 60   Temp 98.2 F (36.8 C) (Oral)   Ht 5\' 2"  (1.575 m)   Wt 152 lb 12.8 oz (69.3 kg)   SpO2 95%   BMI 27.95 kg/m  Gen: NAD, alert, cooperative with exam, well-appearing MSK:  Right shoulder without erythema, edema, or increased warmth. No TTP over right shoulder. ROM is intact at shoulder but painful with abduction and internal rotation. TTP over right piriformis muscle. No TTP over lumbar spine. No TTP over right greater trochanter.  Neuro: Strength at right shoulder 5/5. Negative Neer's and Hawkin's test at right shoulder. Strength 5/5 in LE bilaterally. ?Positive straight leg raise on right.     Assessment & Plan:   Right shoulder pain, unspecified chronicity Given age and location of pain, may be related to OA. Will obtain shoulder x-ray. Also possible that it is related to rotator cuff tendonapthy given pain with certain movements. Less likely to be rotator cuff tear or impingement given preserved strength and negative special tests. Have recommended ice/heat, NSAIDs, and muscle rubs. Patient could potentially benefit from injection pending imaging results and PT. Have recommended follow up with PCP.  - DG Shoulder Right; Future  Pain of right hip joint Unknown etiology. Consider piriformis syndrome given buttocks pain worsened with prolonged sitting with sciatic type symptoms. Straight leg raise was questionably positive so symptoms may be originating from lumbar spine. Doubt trochanter bursitis as patient able to sleep on that side and has no TTP over right hip. Will obtain some baseline imaging to  evaluate for bony deformities. Start low dose gabapentin for presumed neuropathic pain. Supportive care measures and return precautions discussed. Follow up with PCP for further management recommended.  - DG Lumbar Spine Complete; Future - DG HIP UNILAT WITH PELVIS 2-3 VIEWS RIGHT; Future  Phill Myron, D.O. 10/14/2017, 2:01 PM PGY-3, [place]

## 2017-10-14 NOTE — Telephone Encounter (Signed)
-----   Message from Martinique Shirley, DO sent at 10/10/2017 12:03 PM EST ----- Please notify patient of normal results.

## 2017-10-14 NOTE — Telephone Encounter (Signed)
Pt informed. Zimmerman Rumple, April D, CMA  

## 2017-10-14 NOTE — Telephone Encounter (Signed)
-----   Message from Megan Shirley, DO sent at 10/10/2017 12:04 PM EST ----- Please notify patient of normal results with some mild osteoarthritis seen.

## 2017-10-14 NOTE — Telephone Encounter (Signed)
Pt informed. Zimmerman Rumple, Dorinne Graeff D, CMA  

## 2017-10-14 NOTE — Telephone Encounter (Signed)
-----   Message from Martinique Shirley, DO sent at 10/10/2017 12:02 PM EST ----- Please notify patient of normal results.

## 2017-10-21 ENCOUNTER — Telehealth: Payer: Self-pay | Admitting: Family Medicine

## 2017-10-21 DIAGNOSIS — M25511 Pain in right shoulder: Secondary | ICD-10-CM

## 2017-10-21 NOTE — Telephone Encounter (Signed)
Pt said she had her xrays and now is ready to start physical therapy on her right shoulder and right leg. Please advise

## 2017-10-22 NOTE — Telephone Encounter (Signed)
Pt informed. Previn Jian Dawn, CMA  

## 2017-11-05 ENCOUNTER — Other Ambulatory Visit: Payer: Self-pay | Admitting: Family Medicine

## 2017-11-05 DIAGNOSIS — Z1231 Encounter for screening mammogram for malignant neoplasm of breast: Secondary | ICD-10-CM

## 2017-11-06 ENCOUNTER — Other Ambulatory Visit: Payer: Self-pay | Admitting: Family Medicine

## 2017-11-06 DIAGNOSIS — E1169 Type 2 diabetes mellitus with other specified complication: Secondary | ICD-10-CM

## 2017-11-06 DIAGNOSIS — E785 Hyperlipidemia, unspecified: Principal | ICD-10-CM

## 2017-11-11 ENCOUNTER — Other Ambulatory Visit: Payer: Self-pay

## 2017-11-11 ENCOUNTER — Encounter: Payer: Self-pay | Admitting: Family Medicine

## 2017-11-11 ENCOUNTER — Ambulatory Visit: Payer: Medicaid Other | Admitting: Family Medicine

## 2017-11-11 VITALS — BP 128/70 | HR 56 | Temp 98.7°F | Ht 62.0 in | Wt 153.0 lb

## 2017-11-11 DIAGNOSIS — E1122 Type 2 diabetes mellitus with diabetic chronic kidney disease: Secondary | ICD-10-CM | POA: Diagnosis not present

## 2017-11-11 DIAGNOSIS — M25511 Pain in right shoulder: Secondary | ICD-10-CM

## 2017-11-11 DIAGNOSIS — G8929 Other chronic pain: Secondary | ICD-10-CM | POA: Diagnosis not present

## 2017-11-11 DIAGNOSIS — N181 Chronic kidney disease, stage 1: Secondary | ICD-10-CM

## 2017-11-11 MED ORDER — GABAPENTIN 100 MG PO CAPS: 200.0000 mg | ORAL_CAPSULE | Freq: Three times a day (TID) | ORAL | 1 refills | Status: DC

## 2017-11-11 NOTE — Progress Notes (Signed)
    Subjective:  Megan Khan is a 62 y.o. female who presents to the [facility] today with a chief complaint of R shoulder pain.   She is complaining of the same shoulder pain from our last visit.  She has had the XR which showed some degenerative changes and is optimistic about therapy.  We discussed that it could maker her feel more sore for a few visits until she gets used to the effort and she says she understands and is committed to [place] it out.  She cares for a number of her grandchildren and needs to be functional.  She would like to talk about ways to reduce pain until the therapy improves her function  Objective:  Physical Exam: BP 128/70   Pulse (!) 56   Temp 98.7 F (37.1 C) (Oral)   Ht 5\' 2"  (1.575 m)   Wt 153 lb (69.4 kg)   SpO2 98%   BMI 27.98 kg/m   Gen: NAD, conversing comfortably Eyes: blind in right eye with limited vision in left CV: brady with regular rhythm, 2/6 murmur appreciated Pulm: NWOB, CTAB with no crackles, wheezes, or rhonchi GI: Normal bowel sounds present. Soft, Nontender, Nondistended. MSK: no edema, cyanosis, or clubbing noted Skin: warm, dry Neuro: grossly normal, moves all extremities Psych: Normal affect and thought content, pleasant  No results found for this or any previous visit (from the past 72 hour(s)).   Assessment/Plan:  Shoulder pain, right Patient still with R shoulder pain.  XR showed degenerative changes and she has physical therapy appt later this month.  She says the gabapentin 100TID has helped a lot but it still hurts.  She is not seeking narcotics.  We discussed room to raise gabapentin to 200TID to give her some better pain coverage but that therapy was still likely the best option for her long term functioning.  Diabetes mellitus, type 2 ([place]) We discussed that her A1C is still high and she is still not wanting insulins.  I told her we can have a longer discussion of other options next month.  She did have a recent eye  exam with her glaucoma specialist and will ask them to fax records to [place].   [name], DO FAMILY MEDICINE RESIDENT - PGY1 11/11/2017 6:24 PM

## 2017-11-11 NOTE — Assessment & Plan Note (Signed)
We discussed that her A1C is still high and she is still not wanting insulins.  I told her we can have a longer discussion of other options next month.  She did have a recent eye exam with her glaucoma specialist and will ask them to fax records to [place].

## 2017-11-11 NOTE — Assessment & Plan Note (Signed)
Patient still with R shoulder pain.  XR showed degenerative changes and she has physical therapy appt later this month.  She says the gabapentin 100TID has helped a lot but it still hurts.  She is not seeking narcotics.  We discussed room to raise gabapentin to 200TID to give her some better pain coverage but that therapy was still likely the best option for her long term functioning.

## 2017-11-11 NOTE — Patient Instructions (Signed)
It was a pleasure to see you today! Thank you for choosing [facility] for your primary care. Megan Khan was seen for shoulder pain followup. Come back to the clinic in ~25month for your physical, and go to the emergency room if you have any life threatening symptoms.  I'm glad you are going to therapy and your eye appts.   The increased gabapentin and ice should help take the edge off of your pain.  It was nice seeing you    If we did any lab work today, and the results require attention, either me or my nurse will get in touch with you. If everything is normal, you will get a letter in mail and a message via . If you don't hear from [place] in two weeks, please give [place] a call. Otherwise, we look forward to seeing you again at your next visit. If you have any questions or concerns before then, please call the clinic at [phone].  Please bring all your medications to every doctors visit  Sign up for My Chart to have easy access to your labs results, and communication with your Primary care physician.    Please check-out at the front desk before leaving the clinic.    Best,  Dr. [name] FAMILY MEDICINE RESIDENT - PGY1 11/11/2017 11:46 AM

## 2017-11-12 ENCOUNTER — Ambulatory Visit: Payer: Medicaid Other | Admitting: Physical Therapy

## 2017-11-20 ENCOUNTER — Ambulatory Visit: Payer: Medicaid Other | Attending: Family Medicine | Admitting: Physical Therapy

## 2017-11-20 ENCOUNTER — Encounter: Payer: Self-pay | Admitting: Physical Therapy

## 2017-11-20 DIAGNOSIS — M25611 Stiffness of right shoulder, not elsewhere classified: Secondary | ICD-10-CM | POA: Diagnosis not present

## 2017-11-20 DIAGNOSIS — M25511 Pain in right shoulder: Secondary | ICD-10-CM | POA: Insufficient documentation

## 2017-11-20 DIAGNOSIS — H547 Unspecified visual loss: Secondary | ICD-10-CM | POA: Diagnosis not present

## 2017-11-20 NOTE — Therapy (Signed)
[address] [place], [place], [address] Phone: [phone]   Fax:  [phone]  Physical Therapy Evaluation  Patient Details  Name: Megan Khan MRN: [id number] Date of Birth: 03-28-1956 Referring Provider: [name]   Encounter Date: 11/20/2017  PT End of Session - 11/20/17 1346    Visit Number  1    Number of Visits  4    Authorization Type  Mdicaid    PT Start Time  0938    PT Stop Time  1415    PT Time Calculation (min)  42 min    Activity Tolerance  Patient tolerated treatment well    Behavior During Therapy  [facility] for tasks assessed/performed       Past Medical History:  Diagnosis Date  . Diabetes mellitus   . Hx of colonic polyps   . Hyperlipidemia   . Hypertension     Past Surgical History:  Procedure Laterality Date  . COLONOSCOPY  01/22/2012   Procedure: COLONOSCOPY;  Surgeon: [name], MD;  Location: [facility];  Service: Endoscopy;  Laterality: N/A;  . COLONOSCOPY    . COLONOSCOPY    . TUBAL LIGATION      There were no vitals filed for this visit.   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  X-rays show DJD.  She does report a fracture here when she was young.      Limitations  House hold activities    Patient Stated Goals  have less pain and better motions    Currently in Pain?  Yes    Pain Score  4     Pain Location  Shoulder    Pain Orientation  Right    Pain Descriptors / Indicators  Aching;Dull    Pain Type  Acute pain    Pain Onset  More than a month ago    Pain Frequency  Constant    Aggravating Factors   reaching up. lifting, reports pain a 10/10    Pain Relieving Factors  at rest and gabepentin, at best 3/10    Effect of Pain on Daily Activities  just painful and hurts for all ADL's         [facility] PT Assessment -  11/20/17 0001      Assessment   Medical Diagnosis  right shoulder pain    Referring Provider  [name]    Onset Date/Surgical Date  10/20/17    Hand Dominance  Right    Prior Therapy  no      Precautions   Precautions  None    Precaution Comments  she is legally blind      Balance Screen   Has the patient fallen in the past 6 months  No    Has the patient had a decrease in activity level because of a fear of falling?   No    Is the patient reluctant to leave their home because of a fear of falling?   No      Home Environment   Additional Comments  does housework, cares for a child      Prior Function   Level of Independence  Independent    Vocation  On disability    Vocation Requirements  blind    Leisure  no exercise  Posture/Postural Control   Posture Comments  fwd head, rounded shoulders      ROM / Strength   AROM / PROM / Strength  AROM;Strength      AROM   AROM Assessment Site  Shoulder    Right/Left Shoulder  Right    Right Shoulder Flexion  140 Degrees    Right Shoulder ABduction  140 Degrees    Right Shoulder Internal Rotation  30 Degrees    Right Shoulder External Rotation  80 Degrees      Strength   Overall Strength Comments  4-/5 with right shoulder pain      Flexibility   Soft Tissue Assessment /Muscle Length  -- tight in the right UE, + neural tension      Palpation   Palpation comment  she is tender and tight in the upper trap, more in the pectoral, the teres and the right lateral arm      Special Tests   Other special tests  + impingment and painful empty can                Objective measurements completed on examination: See above findings.      [facility] Adult PT Treatment/Exercise - 11/20/17 0001      Modalities   Modalities  Cryotherapy;Electrical Stimulation      Cryotherapy   Number Minutes Cryotherapy  10 Minutes    Cryotherapy Location  Shoulder    Type of Cryotherapy  Ice pack      Electrical Stimulation    Electrical Stimulation Location  right shoulder    Electrical Stimulation Action  IFC    Electrical Stimulation Parameters  sitting    Electrical Stimulation Goals  Pain             PT Education - 11/20/17 1346    Education provided  Yes    Education Details  HEP for shrugs and scapular stabilization    Person(s) Educated  Patient    Methods  Explanation;Demonstration;Handout    Comprehension  Verbalized understanding       PT Short Term Goals - 11/20/17 1352      PT SHORT TERM GOAL #1   Title  independent with initial HEP    Time  2    Period  Weeks    Status  New        PT Long Term Goals - 11/20/17 1352      PT LONG TERM GOAL #1   Title  decrease pain 50%    Time  8    Period  Weeks    Status  New      PT LONG TERM GOAL #2   Title  lift grandchild without pain    Time  8    Period  Weeks    Status  New      PT LONG TERM GOAL #3   Title  increase IR to 70 degrees    Time  8    Period  Weeks    Status  New      PT LONG TERM GOAL #4   Title  decrease overall pain 50%    Time  8    Period  Weeks    Status  New             Plan - 11/20/17 1347    Clinical Impression Statement  Patient reports that she has had right shoulder pain for about 3 months, she reports maybe due to caring for a  grandchild, x-rays show DJD.  She has pretty good ROM, mostly limited into IR, she is very tight and tender in the right pac and the right teres, also tender in the right lateral upper arm.  + impingement test, painful empty can    Clinical Presentation  Stable    Clinical Decision Making  Low    Rehab Potential  Fair    PT Frequency  1x / week    PT Duration  8 weeks    PT Treatment/Interventions  ADLs/Self Care Home Management;Cryotherapy;Electrical Stimulation;Moist Heat;Ultrasound;Therapeutic exercise;Therapeutic activities;Patient/family education;Manual techniques    PT Next Visit Plan  slowly start scapular stabilization    Consulted and Agree with Plan  of Care  Patient       Patient will benefit from skilled therapeutic intervention in order to improve the following deficits and impairments:  Decreased range of motion, Increased muscle spasms, Impaired UE functional use, Pain, Impaired flexibility, Improper body mechanics, Postural dysfunction, Decreased strength  Visit Diagnosis: Acute pain of right shoulder - Plan: PT plan of care cert/re-cert  Stiffness of right shoulder, not elsewhere classified - Plan: PT plan of care cert/re-cert     Problem List Patient Active Problem List   Diagnosis Date Noted  . Shoulder pain, right 07/18/2017  . Lumbar radiculopathy 07/18/2017  . Bunion of great toe of right foot 07/18/2017  . Umbilical hernia 30/94/0768  . Post-menopausal bleeding 12/28/2013  . Polyp of colon, adenomatous 10/03/2011  . CATARACT, HX OF 06/28/2010  . Hyperlipidemia 01/26/2009  . OBESITY, UNSPECIFIED 11/22/2008  . Diabetes mellitus, type 2 ([place]) 06/23/2008  . Open-angle glaucoma of both eyes, severe stage 05/26/2008  . SYSTOLIC MURMUR 08/81/1031  . HYPERTENSION, BENIGN SYSTEMIC 10/10/2006    Sumner Boast., PT 11/20/2017, 1:54 PM  [address] [place], [place], [address] Phone: [phone]   Fax:  [phone]  Name: Megan Khan MRN: [id number] Date of Birth: 08/26/1955

## 2017-11-26 ENCOUNTER — Ambulatory Visit: Payer: Medicaid Other | Admitting: Physical Therapy

## 2017-11-26 ENCOUNTER — Encounter: Payer: Self-pay | Admitting: Physical Therapy

## 2017-11-26 DIAGNOSIS — M25511 Pain in right shoulder: Secondary | ICD-10-CM

## 2017-11-26 DIAGNOSIS — M25611 Stiffness of right shoulder, not elsewhere classified: Secondary | ICD-10-CM
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
## 2017-12-03 ENCOUNTER — Ambulatory Visit: Payer: Medicaid Other | Admitting: Physical Therapy

## 2017-12-06 ENCOUNTER — Ambulatory Visit
Admission: RE | Admit: 2017-12-06 | Discharge: 2017-12-06 | Disposition: A | Discharge status: Home / Self Care | Payer: Medicaid Other | Source: Ambulatory Visit | Attending: Family Medicine | Admitting: Family Medicine

## 2017-12-06 DIAGNOSIS — Z1231 Encounter for screening mammogram for malignant neoplasm of breast: Secondary | ICD-10-CM

## 2017-12-09 ENCOUNTER — Encounter: Payer: Medicaid Other | Admitting: Family Medicine

## 2017-12-10 ENCOUNTER — Ambulatory Visit: Payer: Medicaid Other | Admitting: Physical Therapy

## 2017-12-20 ENCOUNTER — Ambulatory Visit (INDEPENDENT_AMBULATORY_CARE_PROVIDER_SITE_OTHER): Payer: Medicaid Other | Admitting: Family Medicine

## 2017-12-20 ENCOUNTER — Encounter: Payer: Self-pay | Admitting: Family Medicine

## 2017-12-20 ENCOUNTER — Other Ambulatory Visit: Payer: Self-pay

## 2017-12-20 VITALS — BP 116/78 | HR 59 | Temp 98.2°F | Ht 62.0 in | Wt 153.8 lb

## 2017-12-20 DIAGNOSIS — Z794 Long term (current) use of insulin: Secondary | ICD-10-CM | POA: Diagnosis not present

## 2017-12-20 DIAGNOSIS — M19011 Primary osteoarthritis, right shoulder: Secondary | ICD-10-CM

## 2017-12-20 DIAGNOSIS — M19012 Primary osteoarthritis, left shoulder: Secondary | ICD-10-CM

## 2017-12-20 DIAGNOSIS — E1100 Type 2 diabetes mellitus with hyperosmolarity without nonketotic hyperglycemic-hyperosmolar coma (NKHHC): Secondary | ICD-10-CM

## 2017-12-20 LAB — POCT GLYCOSYLATED HEMOGLOBIN (HGB A1C): HEMOGLOBIN A1C: 7.5

## 2017-12-20 NOTE — Assessment & Plan Note (Signed)
A1c today 7.5, offered to have medical management discussion and patient declined saying "I can know that down with diet".    Will recheck in ~3 months

## 2017-12-20 NOTE — Progress Notes (Signed)
    Subjective:  Megan Khan is a 62 y.o. female who presents to the [facility] today with a chief complaint of shoulder pain followup.   HPI: She has had chronic shoulder pain and minimally reduced ROM for years now.   IT hurts worse in the morning than the rest of the day.  A warm shower or moving a lot is the best pain reliever.  Also hurts a lot during weather changes.  She has went to PT for 1 visit and has 2 more to go.  She likes the home workouts with resistance bands they have given her and they have been helping  Objective:  Physical Exam: BP 116/78   Pulse (!) 59   Temp 98.2 F (36.8 C) (Oral)   Ht 5\' 2"  (1.575 m)   Wt 153 lb 12.8 oz (69.8 kg)   SpO2 97%   BMI 28.13 kg/m   Gen: NAD, playing with grandson, some wincing when lifting him, visual deficits both eyes CV: RRR with 2/6 murmur  Pulm: NWOB, CTAB with no crackles, wheezes, or rhonchi MSK: no edema, cyanosis, or clubbing noted.  ROM minimally decreased to abduction, pain even when passively approaching ROM limit cinsistent with osteoarthritis Skin: warm, dry Neuro: grossly normal, moves all extremities Psych: Normal affect and thought content  Results for orders placed or performed in visit on 12/20/17 (from the past 72 hour(s))  HgB A1c     Status: Abnormal   Collection Time: 12/20/17  1:48 PM  Result Value Ref Range   Hemoglobin A1C 7.5      Assessment/Plan:  Osteoarthritis of both shoulders Patient has went to 1 physical therapy visit, liked it and has been doing exercises.  She says she has 2 more visits.  Talked about importance of ROM, sticking to regular exercise to help her stay fit for caring for her grandson, Megan Khan.  Will also try warm compress and occasionally ibuprofen for breakthrough pain  Diabetes mellitus, type 2 ([facility]) A1c today 7.5, offered to have medical management discussion and patient declined saying "I can know that down with diet".    Will recheck in ~3 months   [name],  [place] - PGY1 12/20/2017 2:36 PM

## 2017-12-20 NOTE — Patient Instructions (Signed)
It was a pleasure to see you today! Thank you for choosing [facility] for your primary care. Megan Khan was seen for shoulder pain followup. Come back to the clinic if you have any new issues to discuss, and go to the emergency room if you have any life threatening symptoms.    If we did any lab work today, and the results require attention, either me or my nurse will get in touch with you. If everything is normal, you will get a letter in mail and a message via . If you don't hear from [place] in two weeks, please give [place] a call. Otherwise, we look forward to seeing you again at your next visit. If you have any questions or concerns before then, please call the clinic at [phone].  Please bring all your medications to every doctors visit  Sign up for My Chart to have easy access to your labs results, and communication with your Primary care physician.    Please check-out at the front desk before leaving the clinic.    Best,  Dr. [name] FAMILY MEDICINE RESIDENT - PGY1 12/20/2017 3:26 PM

## 2017-12-20 NOTE — Assessment & Plan Note (Signed)
Patient has went to 1 physical therapy visit, liked it and has been doing exercises.  She says she has 2 more visits.  Talked about importance of ROM, sticking to regular exercise to help her stay fit for caring for her grandson, Megan Khan.  Will also try warm compress and occasionally ibuprofen for breakthrough pain

## 2018-01-04 ENCOUNTER — Other Ambulatory Visit: Payer: Self-pay | Admitting: Family Medicine

## 2018-01-04 DIAGNOSIS — G8929 Other chronic pain: Secondary | ICD-10-CM

## 2018-01-04 DIAGNOSIS — M25511 Pain in right shoulder: Principal | ICD-10-CM

## 2018-01-15 ENCOUNTER — Telehealth: Payer: Self-pay | Admitting: Family Medicine

## 2018-01-15 NOTE — Telephone Encounter (Signed)
Pt called and said when she was seen on 12/20/17 Dr Criss Rosales discussed ordering her diabetic shoes, she was calling to get the number for the place they were ordered but I dont see anything in the chart concerning these shoes.

## 2018-01-16 NOTE — Telephone Encounter (Signed)
Left detailed message informing pt of below. Zimmerman Rumple, April D, CMA  

## 2018-01-20 ENCOUNTER — Encounter: Payer: Self-pay | Admitting: Family Medicine

## 2018-01-20 NOTE — Progress Notes (Signed)
Filled out pre-authorization for diabetic shoes to [facility]  Placed in fax pile 01/20/18  -Dr. Criss Rosales

## 2018-02-08 ENCOUNTER — Other Ambulatory Visit: Payer: Self-pay | Admitting: Family Medicine

## 2018-02-12 ENCOUNTER — Other Ambulatory Visit: Payer: Self-pay | Admitting: Family Medicine

## 2018-02-12 DIAGNOSIS — E1169 Type 2 diabetes mellitus with other specified complication: Secondary | ICD-10-CM

## 2018-02-12 DIAGNOSIS — E785 Hyperlipidemia, unspecified: Principal | ICD-10-CM

## 2018-02-18 ENCOUNTER — Other Ambulatory Visit: Payer: Self-pay | Admitting: Family Medicine

## 2018-02-18 DIAGNOSIS — M25511 Pain in right shoulder: Principal | ICD-10-CM

## 2018-02-18 DIAGNOSIS — G8929 Other chronic pain: Secondary | ICD-10-CM

## 2018-03-06 ENCOUNTER — Other Ambulatory Visit: Payer: Self-pay | Admitting: Family Medicine

## 2018-03-17 ENCOUNTER — Other Ambulatory Visit: Payer: Self-pay | Admitting: Family Medicine

## 2018-03-17 DIAGNOSIS — I10 Essential (primary) hypertension: Secondary | ICD-10-CM

## 2018-05-07 ENCOUNTER — Other Ambulatory Visit: Payer: Self-pay | Admitting: Family Medicine

## 2018-05-22 ENCOUNTER — Ambulatory Visit (INDEPENDENT_AMBULATORY_CARE_PROVIDER_SITE_OTHER): Payer: Medicaid Other | Admitting: *Deleted

## 2018-05-22 DIAGNOSIS — Z23 Encounter for immunization: Secondary | ICD-10-CM | POA: Diagnosis present

## 2018-05-23 ENCOUNTER — Other Ambulatory Visit: Payer: Self-pay | Admitting: Family Medicine

## 2018-05-23 ENCOUNTER — Telehealth: Payer: Self-pay | Admitting: Family Medicine

## 2018-05-23 DIAGNOSIS — N181 Chronic kidney disease, stage 1: Principal | ICD-10-CM

## 2018-05-23 DIAGNOSIS — M25511 Pain in right shoulder: Principal | ICD-10-CM

## 2018-05-23 DIAGNOSIS — G8929 Other chronic pain: Secondary | ICD-10-CM

## 2018-05-23 DIAGNOSIS — E1122 Type 2 diabetes mellitus with diabetic chronic kidney disease: Secondary | ICD-10-CM

## 2018-05-23 NOTE — Telephone Encounter (Signed)
Patient has not had bmp check in >33yrs, ordering bmp and patient will be called for a lab visit.  -Dr. Criss Rosales

## 2018-05-27 ENCOUNTER — Other Ambulatory Visit: Payer: Medicaid Other

## 2018-05-27 DIAGNOSIS — N181 Chronic kidney disease, stage 1: Principal | ICD-10-CM

## 2018-05-27 DIAGNOSIS — E1122 Type 2 diabetes mellitus with diabetic chronic kidney disease: Secondary | ICD-10-CM

## 2018-05-27 NOTE — Telephone Encounter (Signed)
Spoke to pt. She will try to get here today. If not she will try for tomorrow. Depends on when she can get a ride.  Ottis Stain, CMA

## 2018-05-28 LAB — BASIC METABOLIC PANEL
BUN / CREAT RATIO: 24 (ref 12–28)
BUN: 19 mg/dL (ref 8–27)
CO2: 25 mmol/L (ref 20–29)
Calcium: 10 mg/dL (ref 8.7–10.3)
Chloride: 100 mmol/L (ref 96–106)
Creatinine, Ser: 0.79 mg/dL (ref 0.57–1.00)
GFR calc Af Amer: 93 mL/min/{1.73_m2} (ref >59)
GFR, EST NON AFRICAN AMERICAN: 81 mL/min/{1.73_m2} (ref >59)
Glucose: 124 mg/dL — ABNORMAL HIGH (ref 65–99)
POTASSIUM: 3.8 mmol/L (ref 3.5–5.2)
SODIUM: 142 mmol/L (ref 134–144)

## 2018-06-17 ENCOUNTER — Other Ambulatory Visit: Payer: Self-pay | Admitting: Family Medicine

## 2018-06-17 DIAGNOSIS — I10 Essential (primary) hypertension: Secondary | ICD-10-CM

## 2018-07-08 ENCOUNTER — Other Ambulatory Visit: Payer: Self-pay | Admitting: Family Medicine

## 2018-07-08 DIAGNOSIS — G8929 Other chronic pain: Secondary | ICD-10-CM

## 2018-07-08 DIAGNOSIS — M25511 Pain in right shoulder: Principal | ICD-10-CM

## 2018-08-11 ENCOUNTER — Other Ambulatory Visit: Payer: Self-pay | Admitting: Family Medicine

## 2018-08-11 DIAGNOSIS — I10 Essential (primary) hypertension: Secondary | ICD-10-CM

## 2018-08-13 DIAGNOSIS — C50919 Malignant neoplasm of unspecified site of unspecified female breast: Secondary | ICD-10-CM

## 2018-08-13 DIAGNOSIS — C801 Malignant (primary) neoplasm, unspecified: Secondary | ICD-10-CM

## 2018-08-13 HISTORY — DX: Malignant (primary) neoplasm, unspecified: C80.1

## 2018-08-13 HISTORY — DX: Malignant neoplasm of unspecified site of unspecified female breast: C50.919

## 2018-08-26 ENCOUNTER — Other Ambulatory Visit: Payer: Self-pay | Admitting: Family Medicine

## 2018-08-26 DIAGNOSIS — G8929 Other chronic pain: Secondary | ICD-10-CM

## 2018-08-26 DIAGNOSIS — M25511 Pain in right shoulder: Principal | ICD-10-CM

## 2018-10-21 ENCOUNTER — Other Ambulatory Visit: Payer: Self-pay | Admitting: Family Medicine

## 2018-10-21 DIAGNOSIS — M25511 Pain in right shoulder: Principal | ICD-10-CM

## 2018-10-21 DIAGNOSIS — G8929 Other chronic pain: Secondary | ICD-10-CM

## 2018-12-05 ENCOUNTER — Other Ambulatory Visit: Payer: Self-pay | Admitting: Family Medicine

## 2018-12-05 DIAGNOSIS — M25511 Pain in right shoulder: Principal | ICD-10-CM

## 2018-12-05 DIAGNOSIS — G8929 Other chronic pain: Secondary | ICD-10-CM

## 2018-12-22 ENCOUNTER — Other Ambulatory Visit: Payer: Self-pay | Admitting: Family Medicine

## 2018-12-22 DIAGNOSIS — I10 Essential (primary) hypertension: Secondary | ICD-10-CM

## 2018-12-24 ENCOUNTER — Telehealth: Payer: Self-pay | Admitting: Family Medicine

## 2018-12-24 ENCOUNTER — Other Ambulatory Visit: Payer: Self-pay

## 2018-12-24 NOTE — Telephone Encounter (Signed)
Pt called about a refill on her metoprolol tartrate 50 MG. Please give pt a call back.

## 2018-12-25 MED ORDER — METOPROLOL SUCCINATE ER 50 MG PO TB24: 50.0000 mg | ORAL_TABLET | Freq: Every day | ORAL | 0 refills | Status: DC

## 2019-01-08 ENCOUNTER — Other Ambulatory Visit: Payer: Self-pay | Admitting: Family Medicine

## 2019-01-08 DIAGNOSIS — Z1231 Encounter for screening mammogram for malignant neoplasm of breast: Secondary | ICD-10-CM

## 2019-01-22 ENCOUNTER — Other Ambulatory Visit: Payer: Self-pay | Admitting: Family Medicine

## 2019-01-22 DIAGNOSIS — G8929 Other chronic pain: Secondary | ICD-10-CM

## 2019-02-09 ENCOUNTER — Other Ambulatory Visit: Payer: Self-pay | Admitting: Family Medicine

## 2019-02-10 ENCOUNTER — Telehealth: Payer: Self-pay | Admitting: *Deleted

## 2019-02-10 NOTE — Telephone Encounter (Signed)
Pt states that she was unaware that her metoprolol was change to XL at her last fill.   She was still taking 2 of the XL this past month.  The pharmacist told her to only take one from now on.  She just watned to report this to the MD.  Christen Bame, CMA

## 2019-02-24 ENCOUNTER — Ambulatory Visit
Admission: RE | Admit: 2019-02-24 | Discharge: 2019-02-24 | Disposition: A | Discharge status: Home / Self Care | Payer: Medicaid Other | Source: Ambulatory Visit | Attending: Emergency Medicine | Admitting: Emergency Medicine

## 2019-02-24 ENCOUNTER — Other Ambulatory Visit: Payer: Self-pay

## 2019-02-24 DIAGNOSIS — Z1231 Encounter for screening mammogram for malignant neoplasm of breast: Secondary | ICD-10-CM

## 2019-02-26 ENCOUNTER — Other Ambulatory Visit: Payer: Self-pay | Admitting: Emergency Medicine

## 2019-02-26 ENCOUNTER — Other Ambulatory Visit: Payer: Self-pay | Admitting: Family Medicine

## 2019-02-26 DIAGNOSIS — R928 Other abnormal and inconclusive findings on diagnostic imaging of breast: Secondary | ICD-10-CM

## 2019-02-27 ENCOUNTER — Other Ambulatory Visit: Payer: Self-pay | Admitting: Family Medicine

## 2019-02-27 DIAGNOSIS — E785 Hyperlipidemia, unspecified: Secondary | ICD-10-CM

## 2019-02-27 DIAGNOSIS — E1169 Type 2 diabetes mellitus with other specified complication: Secondary | ICD-10-CM

## 2019-03-04 ENCOUNTER — Ambulatory Visit
Admission: RE | Admit: 2019-03-04 | Discharge: 2019-03-04 | Disposition: A | Discharge status: Home / Self Care | Payer: Medicaid Other | Source: Ambulatory Visit | Attending: Family Medicine | Admitting: Family Medicine

## 2019-03-04 ENCOUNTER — Other Ambulatory Visit: Payer: Self-pay | Admitting: Family Medicine

## 2019-03-04 ENCOUNTER — Other Ambulatory Visit: Payer: Self-pay

## 2019-03-04 DIAGNOSIS — N6310 Unspecified lump in the right breast, unspecified quadrant: Secondary | ICD-10-CM

## 2019-03-04 DIAGNOSIS — R928 Other abnormal and inconclusive findings on diagnostic imaging of breast: Secondary | ICD-10-CM

## 2019-03-11 ENCOUNTER — Other Ambulatory Visit: Payer: Self-pay | Admitting: Family Medicine

## 2019-03-11 ENCOUNTER — Ambulatory Visit
Admission: RE | Admit: 2019-03-11 | Discharge: 2019-03-11 | Disposition: A | Discharge status: Home / Self Care | Payer: Medicaid Other | Source: Ambulatory Visit | Attending: Family Medicine | Admitting: Family Medicine

## 2019-03-11 ENCOUNTER — Other Ambulatory Visit: Payer: Self-pay

## 2019-03-11 DIAGNOSIS — G8929 Other chronic pain: Secondary | ICD-10-CM

## 2019-03-11 DIAGNOSIS — N6310 Unspecified lump in the right breast, unspecified quadrant: Secondary | ICD-10-CM

## 2019-03-18 NOTE — Progress Notes (Signed)
Location of Breast Cancer: Malignant neoplasm of upper outer quadrant of right breast - - +  Did patient present with symptoms (if so, please note symptoms) or was this found on screening mammography?:  Routine screening mammogram showed a 10 mm mass in the right breast, 11 o'clock position, 7 cm from the nipple.  Axillary ultrasound is negative.  Histology per Pathology Report: Right Breast 03/11/2019   Receptor Status: ER(0% -), PR (0% -), Her2-neu (+), Ki-67(10%)    Past/Anticipated interventions by surgeon, if any: Dr. Dalbert Batman 03/16/2019 - We talked about lumpectomy, sentinel node biopsy, mastectomy, and radiation therapy.  She is in favor of lumpectomy and I think she is an excellent candidate for that. -She will be referred to Dr. Jana Hakim pre-op, she will also be referred to radiation oncology. -She will be discussed in breast conference this Wednesday.  I told her they may ask for port a cath insertion if they decide to give Herceptin based chemotherapy. -At this time we're going to make her an appointment to see me in 2 weeks.  If decisions are made sooner than we can simply go ahead and schedule surgery.  Past/Anticipated interventions by medical oncology, if any: Chemotherapy  Dr. Jana Hakim- no appointment scheduled   Lymphedema issues, if any: No  Pain issues, if any: Has some residual tenderness from biopsy.  SAFETY ISSUES:  Prior radiation? No  Pacemaker/ICD? No  Possible current pregnancy? Postmenopausal, tubal ligation  Is the patient on methotrexate? No  Current Complaints / other details:   -Legally Blind- due to acute glaucoma -Sister treated with breast cancer, maternal cousin treated with breast cancer. -Uses county transportation -Takes Metformin     Cori Razor, RN 03/18/2019,9:12 AM

## 2019-03-19 ENCOUNTER — Encounter: Payer: Self-pay | Admitting: Radiation Oncology

## 2019-03-19 ENCOUNTER — Telehealth: Payer: Self-pay | Admitting: Oncology

## 2019-03-19 ENCOUNTER — Ambulatory Visit
Admission: RE | Admit: 2019-03-19 | Discharge: 2019-03-19 | Disposition: A | Discharge status: Home / Self Care | Payer: Medicaid Other | Source: Ambulatory Visit | Attending: Radiation Oncology | Admitting: Radiation Oncology

## 2019-03-19 ENCOUNTER — Other Ambulatory Visit: Payer: Self-pay

## 2019-03-19 ENCOUNTER — Other Ambulatory Visit: Payer: Self-pay | Admitting: General Surgery

## 2019-03-19 DIAGNOSIS — C50911 Malignant neoplasm of unspecified site of right female breast: Secondary | ICD-10-CM

## 2019-03-19 DIAGNOSIS — C50411 Malignant neoplasm of upper-outer quadrant of right female breast: Secondary | ICD-10-CM | POA: Insufficient documentation

## 2019-03-19 DIAGNOSIS — Z171 Estrogen receptor negative status [ER-]: Secondary | ICD-10-CM

## 2019-03-19 HISTORY — DX: Legal blindness, as defined in USA: H54.8

## 2019-03-19 HISTORY — DX: Unspecified glaucoma: H40.9

## 2019-03-19 NOTE — Progress Notes (Addendum)
Radiation Oncology         [phone] ________________________________  Initial Outpatient Consultation - Conducted via telephone due to current COVID-19 concerns for limiting patient exposure  I spoke with the patient to conduct this consult visit via telephone to spare the patient unnecessary potential exposure in the healthcare setting during the current COVID-19 pandemic. The patient was notified in advance and was offered a [place] meeting to allow for face to face communication but unfortunately reported that they did not have the appropriate resources/technology to support such a visit and instead preferred to proceed with a telephone consult.   ________________________________  Name: [place]        MRN: [id number]  Date of Service: 03/19/2019 DOB: Jan 24, 1956  OA:CZYSA, [place], DO  Fanny Skates, MD     REFERRING PHYSICIAN: Fanny Skates, MD   DIAGNOSIS: The encounter diagnosis was Malignant neoplasm of right breast in female, estrogen receptor negative, unspecified site of breast ([place]).   HISTORY OF PRESENT ILLNESS: [place] is a 63 y.o. female seen for new diagnosis of right breast cancer.  The patient underwent screening mammogram revealing an asymmetry in the right  breast.  Diagnostic imaging including ultrasound revealed a 1 x 0.7 x 0.7 cm mass at the 11 o'clock position of the right breast.  Her right axilla was negative for adenopathy.  She underwent biopsy on 03/11/2019 which revealed a grade 2 invasive ductal carcinoma.  Her prognostic panel revealed the tumor to be ER/PR negative, HER-2 amplified with a Ki-67 of 10%.  She is contacted today to discuss treatment recommendations.   PREVIOUS RADIATION THERAPY: No   PAST MEDICAL HISTORY:  Past Medical History:  Diagnosis Date   Diabetes mellitus    Glaucoma    Hx of colonic polyps    Hyperlipidemia    Hypertension    Legally blind        PAST SURGICAL HISTORY: Past Surgical History:    Procedure Laterality Date   COLONOSCOPY  01/22/2012   Procedure: COLONOSCOPY;  Surgeon: [name], MD;  Location: [facility];  Service: Endoscopy;  Laterality: N/A;   COLONOSCOPY     COLONOSCOPY     TUBAL LIGATION       FAMILY HISTORY:  Family History  Problem Relation Age of Onset   Breast cancer Sister        Her2 +   Breast cancer Cousin        Bilateral mastectomy     SOCIAL HISTORY:  reports that she quit smoking about 23 years ago. Her smoking use included cigarettes. She has never used smokeless tobacco. She reports that she does not drink alcohol or use drugs. The patient is single and lives in [place]. She lives with her son who has cerebral palsy. She is legally blind and relies on family to help her with cleaning and shopping.    ALLERGIES: Sulfamethoxazole   MEDICATIONS:  Current Outpatient Medications  Medication Sig Dispense Refill   amLODipine (NORVASC) 10 MG tablet TAKE 1 TABLET(10 MG) BY MOUTH DAILY 90 tablet 3   aspirin 81 MG EC tablet Take 81 mg by mouth daily.       benazepril-hydrochlorthiazide (LOTENSIN HCT) 20-25 MG tablet Take by mouth.     blood glucose meter kit and supplies KIT Dispense based on patient and insurance preference. Use up to four times daily as directed. (FOR ICD-9 250.00, 250.01). 1 each 12   gabapentin (NEURONTIN) 100 MG capsule TAKE 2 CAPSULES(200 MG) BY  MOUTH THREE TIMES DAILY 180 capsule 0   lisinopril-hydrochlorothiazide (PRINZIDE,ZESTORETIC) 20-25 MG tablet TAKE 1 TABLET BY MOUTH DAILY 90 tablet 3   lovastatin (MEVACOR) 20 MG tablet TAKE 1 TABLET BY MOUTH EVERY NIGHT AT BEDTIME 90 tablet 3   metFORMIN (GLUCOPHAGE) 1000 MG tablet TAKE 1 TABLET BY MOUTH TWICE DAILY WITH MEALS 360 tablet 1   metoprolol succinate (TOPROL-XL) 50 MG 24 hr tablet TAKE 1 TABLET(50 MG) BY MOUTH DAILY 90 tablet 0   metoprolol tartrate (LOPRESSOR) 50 MG tablet TAKE 1 TABLET BY MOUTH TWICE DAILY 180 tablet 0   Multiple  Vitamins-Minerals (WOMENS MULTIVITAMIN PLUS) TABS Take 1 tablet by mouth daily.       timolol (TIMOPTIC) 0.5 % ophthalmic solution Place 1 drop into both eyes 2 times daily.     Homeopathic Products ([employer] PINK EYE) SOLN Place 1-2 drops into both eyes every 4 (four) hours.       No current facility-administered medications for this encounter.      REVIEW OF SYSTEMS: On review of systems, the patient reports that she is doing well overall. She is legally blind and can only see large letters on a page. She cares for her son who has cerebral palsy. She is concerned about needing help navigating in the cancer center and would need the help of her sister to proceed with treatment. She denies any chest pain, shortness of breath, cough, fevers, chills, night sweats, unintended weight changes. She denies any bowel or bladder disturbances, and denies abdominal pain, nausea or vomiting. She denies any new musculoskeletal or joint aches or pains. A complete review of systems is obtained and is otherwise negative.     PHYSICAL EXAM:  Wt Readings from Last 3 Encounters:  03/19/19 155 lb (70.3 kg)  12/20/17 153 lb 12.8 oz (69.8 kg)  11/11/17 153 lb (69.4 kg)  unable to assess due to encounter type.   ECOG = 0  0 - Asymptomatic (Fully active, able to carry on all predisease activities without restriction)  1 - Symptomatic but completely ambulatory (Restricted in physically strenuous activity but ambulatory and able to carry out work of a light or sedentary nature. For example, light housework, office work)  2 - Symptomatic, <50% in bed during the day (Ambulatory and capable of all self care but unable to carry out any work activities. Up and about more than 50% of waking hours)  3 - Symptomatic, >50% in bed, but not bedbound (Capable of only limited self-care, confined to bed or chair 50% or more of waking hours)  4 - Bedbound (Completely disabled. Cannot carry on any self-care. Totally confined to  bed or chair)  5 - Death   Eustace Pen MM, Creech RH, Tormey DC, et al. [phone]). "Toxicity and response criteria of the [facility]". [place] Oncol. 5 (6): 649-55    LABORATORY DATA:  Lab Results  Component Value Date   WBC 5.3 03/25/2014   HGB 12.7 03/25/2014   HCT 38.1 03/25/2014   MCV 80.7 03/25/2014   PLT 300 03/25/2014   Lab Results  Component Value Date   NA 142 05/27/2018   K 3.8 05/27/2018   CL 100 05/27/2018   CO2 25 05/27/2018   Lab Results  Component Value Date   ALT 14 01/30/2016   AST 14 01/30/2016   ALKPHOS 62 01/30/2016   BILITOT 0.4 01/30/2016      RADIOGRAPHY: US Breast Ltd Uni Right Inc Axilla  Result Date: 03/04/2019 CLINICAL DATA:  Patient was called back from screening mammogram for a possible asymmetry in the right breast. EXAM: DIGITAL DIAGNOSTIC RIGHT MAMMOGRAM WITH CAD AND TOMO ULTRASOUND RIGHT BREAST COMPARISON:  Previous exam(s). ACR Breast Density Category b: There are scattered areas of fibroglandular density. FINDINGS: Additional imaging of the right breast was performed. There is a persistent asymmetry and distortion in the middle third of the upper-outer quadrant of the breast. There are no malignant type microcalcifications. Mammographic images were processed with CAD. On physical exam, I do not palpate a mass in the upper-outer quadrant of the right breast. Targeted ultrasound is performed, showing an irregular hypoechoic mass in the right breast 11 o'clock 7 cm from the nipple measuring 7 x 7 x 10 mm. Sonographic evaluation of the right axilla does not show any enlarged adenopathy. IMPRESSION: Suspicious mass in the 11 o'clock region of the right breast. RECOMMENDATION: Ultrasound-guided core biopsy of the mass in the 11 o'clock region of the right breast is recommended. I have discussed the findings and recommendations with the patient. Results were also provided in writing at the conclusion of the visit. If applicable, a  reminder letter will be sent to the patient regarding the next appointment. BI-RADS CATEGORY  5: Highly suggestive of malignancy. Electronically Signed   By: Lillia Mountain M.D.   On: 03/04/2019 12:10   Mm Diag Breast Tomo Uni Right  Result Date: 03/04/2019 CLINICAL DATA:  Patient was called back from screening mammogram for a possible asymmetry in the right breast. EXAM: DIGITAL DIAGNOSTIC RIGHT MAMMOGRAM WITH CAD AND TOMO ULTRASOUND RIGHT BREAST COMPARISON:  Previous exam(s). ACR Breast Density Category b: There are scattered areas of fibroglandular density. FINDINGS: Additional imaging of the right breast was performed. There is a persistent asymmetry and distortion in the middle third of the upper-outer quadrant of the breast. There are no malignant type microcalcifications. Mammographic images were processed with CAD. On physical exam, I do not palpate a mass in the upper-outer quadrant of the right breast. Targeted ultrasound is performed, showing an irregular hypoechoic mass in the right breast 11 o'clock 7 cm from the nipple measuring 7 x 7 x 10 mm. Sonographic evaluation of the right axilla does not show any enlarged adenopathy. IMPRESSION: Suspicious mass in the 11 o'clock region of the right breast. RECOMMENDATION: Ultrasound-guided core biopsy of the mass in the 11 o'clock region of the right breast is recommended. I have discussed the findings and recommendations with the patient. Results were also provided in writing at the conclusion of the visit. If applicable, a reminder letter will be sent to the patient regarding the next appointment. BI-RADS CATEGORY  5: Highly suggestive of malignancy. Electronically Signed   By: Lillia Mountain M.D.   On: 03/04/2019 12:10   Mm 3d Screen Breast Bilateral  Result Date: 02/25/2019 CLINICAL DATA:  Screening. EXAM: DIGITAL SCREENING BILATERAL MAMMOGRAM WITH TOMO AND CAD COMPARISON:  Previous exam(s). ACR Breast Density Category b: There are scattered areas of  fibroglandular density. FINDINGS: In the right breast, a possible asymmetry warrants further evaluation. In the left breast, no findings suspicious for malignancy. Images were processed with CAD. IMPRESSION: Further evaluation is suggested for possible asymmetry in the right breast. RECOMMENDATION: Diagnostic mammogram and possibly ultrasound of the right breast. (Code:[id number]) The patient will be contacted regarding the findings, and additional imaging will be scheduled. BI-RADS CATEGORY  0: Incomplete. Need additional imaging evaluation and/or prior mammograms for comparison. Electronically Signed   By: Kristopher Oppenheim M.D.   On: 02/25/2019  12:52   Mm Clip Placement Right  Result Date: 03/11/2019 CLINICAL DATA:  Ultrasound-guided core needle biopsy was performed of a suspicious mass 11 o'clock position right breast EXAM: DIAGNOSTIC RIGHT MAMMOGRAM POST ULTRASOUND BIOPSY COMPARISON:  Previous exam(s). FINDINGS: Mammographic images were obtained following ultrasound guided biopsy of a right breast mass 11 o'clock position. Ribbon shaped biopsy clip is satisfactorily positioned along the anterior aspect of the biopsied mass. IMPRESSION: Satisfactory position of ribbon shaped biopsy clip. Final Assessment: Post Procedure Mammograms for Marker Placement Electronically Signed   By: Curlene Dolphin M.D.   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 This was found to be concordant by Dr. Curlene Dolphin. Pathology results were discussed with the patient by telephone. The patient reported doing well after the biopsy with tenderness at the site. Post biopsy instructions and care were reviewed and questions were answered. The patient was encouraged to call [facility] for any additional concerns. Surgical consultation has been arranged with Dr. Fanny Skates at [facility] on March 16, 2019. Pathology results reported by Stacie Acres, RN on 03/13/2019. Electronically Signed   By: Curlene Dolphin M.D.   On: 03/13/2019 16:41   Result Date: 03/16/2019 CLINICAL DATA:  Ultrasound-guided core needle biopsy was recommended of a mass in the 11 o'clock position of the right breast. EXAM: ULTRASOUND GUIDED RIGHT BREAST CORE NEEDLE BIOPSY COMPARISON:  Previous exam(s). FINDINGS: I met with the patient and we discussed the procedure of ultrasound-guided biopsy, including benefits and alternatives. We discussed the high likelihood of a successful procedure. We discussed the risks of the procedure, including infection, bleeding, tissue injury, clip migration, and inadequate sampling. Informed written consent was given. The usual time-out protocol was performed immediately prior to the procedure. Lesion quadrant: Upper outer quadrant Using sterile technique and 1% Lidocaine as local anesthetic, under direct ultrasound visualization, a 12 gauge spring-loaded device was used to perform biopsy of a hypoechoic 1.0 cm mass 11 o'clock position 7 cm from nipple using a lateral approach. At the conclusion of the procedure a ribbon tissue marker clip was deployed into the biopsy cavity. Follow up 2 view mammogram was performed and dictated separately. IMPRESSION: Ultrasound guided biopsy of the right breast. No apparent complications. Electronically Signed: By: Curlene Dolphin M.D. On: 03/11/2019 08:33       IMPRESSION/PLAN: 1. 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 Dr. Lisbeth Renshaw discusses the pathology findings and reviews the nature of HER-2 amplified right breast disease. The consensus from the breast conference includes proceeding with Port-A-Cath placement and lumpectomy sentinel lymph node and subsequent  external beam radiotherapy. She would likely receive adjuvant chemotherapy and Herceptin.  We discussed that radiation would be several months from now given the other treatments that have been recommended.  We discussed the rationale for the radiation to reduce the risk of local recurrence.  We discussed the risks, benefits, short, and long term effects of radiotherapy, and the patient is interested in treatment at the appropriate time. Dr. Lisbeth Renshaw discusses the delivery and logistics of radiotherapy and anticipates a course of 4-61/2 weeks of radiotherapy, but anticipate 4 weeks based on the current data. We will see her  back a few weeks following completion of systemic therapy to discuss proceeding.  She is in agreement with the rationale to proceed with the appropriate time. 2. Possible genetic predisposition to malignancy. The patient is a candidate for genetic testing given her personal and family history. She was offered referral and is interested in referral. She does state that she would not choose mastectomy however if she tested positive. 3. Visual acuity limitations. Dr. Lisbeth Renshaw approves the patient having a family member accompany her to our office for treatment planning and treatments. We will notify our scheduling and registration staff.   Given current concerns for patient exposure during the COVID-19 pandemic, this encounter was conducted via telephone.  The patient has given verbal consent for this type of encounter. The time spent during this encounter was 45 minutes and 50% of that time was spent in the coordination of her care. The attendants for this meeting include Dr. Lisbeth Renshaw, Blenda Nicely, RN, Shona Simpson, [facility] and Jerilee Hoh  During the encounter, Dr. Lisbeth Renshaw, Blenda Nicely, RN, Shona Simpson [facility] was located at [facility] Radiation Oncology Department.  [place]  was located at home.   The above documentation reflects my direct findings during this  shared patient visit. Please see the separate note by Dr. Lisbeth Renshaw on this date for the remainder of the patient's plan of care.    Carola Rhine, PAC

## 2019-03-19 NOTE — Telephone Encounter (Signed)
Received a new patient referral from Dr. Dalbert Batman for breast cancer. Ms. Sazama has been cld and scheduled to see Dr. Jana Hakim on 8/17 at 4pm.

## 2019-03-29 NOTE — Progress Notes (Signed)
[facility]  [phone] Fax:(336) [phone]    ID: Megan Khan DOB: 1956-03-27  MR#: [id number]  CSN#:[id number]  Patient Care Team: Megan Sires, DO as PCP - General Megan Khan, Megan Dad, MD as Consulting Physician (Oncology) Megan Skates, MD as Consulting Physician (General Surgery) Megan Rudd, MD as Consulting Physician (Radiation Oncology) Megan Khan, DPM as Consulting Physician (Podiatry) Megan Pearson, MD as Consulting Physician (Ophthalmology) OTHER MD:   CHIEF COMPLAINT: Estrogen receptor negative breast cancer  CURRENT TREATMENT: Awaiting definitive surgery   HISTORY OF CURRENT ILLNESS: Megan Khan had routine screening mammography on 02/24/2019 showing a possible abnormality in the right breast. She underwent unilateral right diagnostic mammography with tomography and right breast ultrasonography at [facility] on 03/04/2019 showing: Breast Density Category B. Additional imaging of the right breast was performed. There is a persistent asymmetry and distortion in the middle third of the upper-outer quadrant of the breast. There are no malignant type microcalcifications. On physical exam, I do not palpate a mass in the upper-outer quadrant of the right breast. Targeted ultrasound is performed, showing an irregular hypoechoic mass in the right breast 11 o'clock 7 cm from the nipple measuring 0.7 x 0.7 x 1.0 cm. Sonographic evaluation of the right axilla does not show any enlarged adenopathy.  Accordingly on 03/11/2019 she proceeded to biopsy of the right breast area in question. The pathology from this procedure showed ([id number]): invasive ductal carcinoma, grade II, 11 o'clock 7 cm from the nipple. Prognostic indicators significant for: estrogen receptor, 0% negative and progesterone receptor, 0% negative. Proliferation marker Ki67 at 10%. HER2 positive (3+) by immunohistochemistry.  The patient's subsequent history is as detailed  below.   INTERVAL HISTORY: Megan Khan was evaluated in the multidisciplinary breast cancer clinic on 03/30/2019 accompanied by her sister, Megan Khan.  Her case was also presented at the multidisciplinary breast cancer conference on 03/18/2019. At that time a preliminary plan was proposed: Genetics testing, port placement, Taxol and trastuzumab, adjuvant radiation     REVIEW OF SYSTEMS: There were no specific symptoms leading to the original mammogram, which was routinely scheduled. The patient denies unusual headaches, visual changes, nausea, vomiting, stiff neck, dizziness, or gait imbalance. There has been no cough, phlegm production, or pleurisy, no chest pain or pressure, and no change in bowel or bladder habits. The patient denies fever, rash, bleeding, unexplained fatigue or unexplained weight loss. A detailed review of systems was otherwise entirely negative.   PAST MEDICAL HISTORY: Past Medical History:  Diagnosis Date   Diabetes mellitus    Glaucoma    Hx of colonic polyps    Hyperlipidemia    Hypertension    Legally blind     PAST SURGICAL HISTORY: Past Surgical History:  Procedure Laterality Date   COLONOSCOPY  01/22/2012   Procedure: COLONOSCOPY;  Surgeon: Megan Horner, MD;  Location: [facility];  Service: Endoscopy;  Laterality: N/A;   COLONOSCOPY     COLONOSCOPY     TUBAL LIGATION       FAMILY HISTORY: Family History  Problem Relation Age of Onset   Breast cancer Sister        Her2 +   Breast cancer Cousin        Bilateral mastectomy   Megan Khan's father died from natural causes at age 39. Patients' mother died from natural causes at age 25. The patient has 5 brothers and 3 sisters.  The patient's sister, Megan Khan, is my patient diagnosed with breast cancer at 36.  The cyst cancer was estrogen receptor positive, HER-2 negative.  She received no chemo.  Megan Khan's maternal 2nd cousin was diagnosed with breast cancer. Patient denies anyone in her family having  ovarian, prostate, or pancreatic cancer.    GYNECOLOGIC HISTORY:  No LMP recorded. Patient is postmenopausal. Menarche: 63 years old Age at first live birth: 63 years old GX P: 3 LMP: 15 Contraceptive: no HRT: no  Hysterectomy?: no BSO?: no   SOCIAL HISTORY: (Current as of 03/30/2019) Megan Khan is disabled secondary to her glaucoma.she is legally blind.  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 She is divorced. She lives with her son, Megan Khan who is 58 and is disabled with cerebral palsy.  He does attend school.Her son Megan Khan was killed (gunshot wound) age 24.Marland Kitchen Her son, Megan Khan, is 37 and works for this [employer]. Her daughter, Megan Khan, is 69 and is a Quar[employer] manager. Megan Khan has 7 grandchildren and 2 great-grandchildren.  She attends the [employer]: Not in place. She has verbally named her sister, Megan Khan, as her healthcare power of attorney.  At the 03/30/2019 visit she was given the appropriate documents to complete and notarize at her discretion  HEALTH MAINTENANCE: Social History   Tobacco Use   Smoking status: Former Smoker    Types: Cigarettes    Quit date: 09/23/1995    Years since quitting: 23.5   Smokeless tobacco: Never Used  Substance Use Topics   Alcohol use: No   Drug use: No    Colonoscopy: June 2013/again in  PAP: Per Megan Khan  Bone density: Never  Allergies  Allergen Reactions   Sulfamethoxazole Rash    REACTION: Questionable allergy ?    Current Outpatient Medications  Medication Sig Dispense Refill   amLODipine (NORVASC) 10 MG tablet TAKE 1 TABLET(10 MG) BY MOUTH DAILY 90 tablet 3   aspirin 81 MG EC tablet Take 81 mg by mouth daily.       blood glucose meter kit and supplies KIT Dispense based on patient and insurance preference. Use up to four times daily as directed. (FOR ICD-9 250.00, 250.01). 1 each 12   gabapentin (NEURONTIN) 100 MG capsule TAKE 2 CAPSULES(200 MG) BY MOUTH  THREE TIMES DAILY 180 capsule 0   lisinopril-hydrochlorothiazide (PRINZIDE,ZESTORETIC) 20-25 MG tablet TAKE 1 TABLET BY MOUTH DAILY 90 tablet 3   lovastatin (MEVACOR) 20 MG tablet TAKE 1 TABLET BY MOUTH EVERY NIGHT AT BEDTIME 90 tablet 3   metFORMIN (GLUCOPHAGE) 1000 MG tablet TAKE 1 TABLET BY MOUTH TWICE DAILY WITH MEALS 360 tablet 1   metoprolol succinate (TOPROL-XL) 50 MG 24 hr tablet TAKE 1 TABLET(50 MG) BY MOUTH DAILY 90 tablet 0   timolol (TIMOPTIC) 0.5 % ophthalmic solution Place 1 drop into both eyes 2 times daily.     Homeopathic Products ([employer] PINK EYE) SOLN Place 1-2 drops into both eyes every 4 (four) hours.       No current facility-administered medications for this visit.      OBJECTIVE: Middle-aged African-American woman in no acute distress  Vitals:   03/30/19 1618  BP: (!) 144/89  Pulse: 60  Resp: 18  Temp: 98.3 F (36.8 C)  SpO2: 100%     Body mass index is 30.25 kg/m.   Wt Readings from Last 3 Encounters:  03/30/19 160 lb 1.6 oz (72.6 kg)  03/19/19 155 lb (70.3 kg)  12/20/17 153 lb 12.8 oz (69.8 kg)  ECOG FS:1 - Symptomatic but completely ambulatory  Ocular: Sclerae unicteric, patient is legally blind Wearing a mask Lymphatic: No cervical or supraclavicular adenopathy Lungs no rales or rhonchi Heart regular rate and rhythm Abd soft, nontender, positive bowel sounds MSK no focal spinal tenderness, no joint edema Neuro: non-focal, well-oriented, appropriate affect Breasts: I do not palpate a mass in either breast.  There are no skin or nipple changes of concern.  Both axillae are benign.   LAB RESULTS:  CMP     Component Value Date/Time   NA 142 05/27/2018 1533   K 3.8 05/27/2018 1533   CL 100 05/27/2018 1533   CO2 25 05/27/2018 1533   GLUCOSE 124 (H) 05/27/2018 1533   GLUCOSE 92 01/30/2016 1130   BUN 19 05/27/2018 1533   CREATININE 0.79 05/27/2018 1533   CREATININE 0.79 01/30/2016 1130   CALCIUM 10.0 05/27/2018 1533   PROT 7.1  01/30/2016 1130   ALBUMIN 4.3 01/30/2016 1130   AST 14 01/30/2016 1130   ALT 14 01/30/2016 1130   ALKPHOS 62 01/30/2016 1130   BILITOT 0.4 01/30/2016 1130   GFRNONAA 81 05/27/2018 1533   GFRNONAA 82 01/30/2016 1130   GFRAA 93 05/27/2018 1533   GFRAA >89 01/30/2016 1130    No results found for: TOTALPROTELP, ALBUMINELP, A1GS, A2GS, BETS, BETA2SER, GAMS, MSPIKE, SPEI  No results found for: KPAFRELGTCHN, LAMBDASER, KAPLAMBRATIO  Lab Results  Component Value Date   WBC 5.3 03/25/2014   NEUTROABS 2.4 10/25/2009   HGB 12.7 03/25/2014   HCT 38.1 03/25/2014   MCV 80.7 03/25/2014   PLT 300 03/25/2014    '@LASTCHEMISTRY'$ @  No results found for: LABCA2  No components found for: [id number]  No results for input(s): INR in the last 168 hours.  No results found for: LABCA2  No results found for: UTM546  No results found for: TKP546  No results found for: FKC127  No results found for: CA2729  No components found for: HGQUANT  No results found for: CEA1 / No results found for: CEA1   No results found for: AFPTUMOR  No results found for: CHROMOGRNA  No results found for: PSA1  No visits with results within 3 Day(s) from this visit.  Latest known visit with results is:  Appointment on 05/27/2018  Component Date Value Ref Range Status   Glucose 05/27/2018 124* 65 - 99 mg/dL Final   BUN 05/27/2018 19  8 - 27 mg/dL Final   Creatinine, Ser 05/27/2018 0.79  0.57 - 1.00 mg/dL Final   GFR calc non Af Amer 05/27/2018 81  >59 mL/min/1.73 Final   GFR calc Af Amer 05/27/2018 93  >59 mL/min/1.73 Final   BUN/Creatinine Ratio 05/27/2018 24  12 - 28 Final   Sodium 05/27/2018 142  134 - 144 mmol/L Final   Potassium 05/27/2018 3.8  3.5 - 5.2 mmol/L Final   Chloride 05/27/2018 100  96 - 106 mmol/L Final   CO2 05/27/2018 25  20 - 29 mmol/L Final   Calcium 05/27/2018 10.0  8.7 - 10.3 mg/dL Final    (this displays the last labs from the last 3 days)  No results found for:  TOTALPROTELP, ALBUMINELP, A1GS, A2GS, BETS, BETA2SER, GAMS, MSPIKE, SPEI (this displays SPEP labs)  No results found for: KPAFRELGTCHN, LAMBDASER, KAPLAMBRATIO (kappa/lambda light chains)  No results found for: HGBA, HGBA2QUANT, HGBFQUANT, HGBSQUAN (Hemoglobinopathy evaluation)   No results found for: LDH  No results found for: IRON, TIBC, IRONPCTSAT (Iron and TIBC)  No results found for: FERRITIN  Urinalysis No results found for: COLORURINE, APPEARANCEUR, LABSPEC, PHURINE, GLUCOSEU, HGBUR, BILIRUBINUR, KETONESUR, PROTEINUR, UROBILINOGEN, NITRITE, LEUKOCYTESUR   STUDIES:  US Breast Ltd Uni Right Inc Axilla  Result Date: 03/04/2019 CLINICAL DATA:  Patient was called back from screening mammogram for a possible asymmetry in the right breast. EXAM: DIGITAL DIAGNOSTIC RIGHT MAMMOGRAM WITH CAD AND TOMO ULTRASOUND RIGHT BREAST COMPARISON:  Previous exam(s). ACR Breast Density Category b: There are scattered areas of fibroglandular density. FINDINGS: Additional imaging of the right breast was performed. There is a persistent asymmetry and distortion in the middle third of the upper-outer quadrant of the breast. There are no malignant type microcalcifications. Mammographic images were processed with CAD. On physical exam, I do not palpate a mass in the upper-outer quadrant of the right breast. Targeted ultrasound is performed, showing an irregular hypoechoic mass in the right breast 11 o'clock 7 cm from the nipple measuring 7 x 7 x 10 mm. Sonographic evaluation of the right axilla does not show any enlarged adenopathy. IMPRESSION: Suspicious mass in the 11 o'clock region of the right breast. RECOMMENDATION: Ultrasound-guided core biopsy of the mass in the 11 o'clock region of the right breast is recommended. I have discussed the findings and recommendations with the patient. Results were also provided in writing at the conclusion of the visit. If applicable, a reminder letter will be sent to the  patient regarding the next appointment. BI-RADS CATEGORY  5: Highly suggestive of malignancy. Electronically Signed   By: Lillia Mountain M.D.   On: 03/04/2019 12:10   Mm Diag Breast Tomo Uni Right  Result Date: 03/04/2019 CLINICAL DATA:  Patient was called back from screening mammogram for a possible asymmetry in the right breast. EXAM: DIGITAL DIAGNOSTIC RIGHT MAMMOGRAM WITH CAD AND TOMO ULTRASOUND RIGHT BREAST COMPARISON:  Previous exam(s). ACR Breast Density Category b: There are scattered areas of fibroglandular density. FINDINGS: Additional imaging of the right breast was performed. There is a persistent asymmetry and distortion in the middle third of the upper-outer quadrant of the breast. There are no malignant type microcalcifications. Mammographic images were processed with CAD. On physical exam, I do not palpate a mass in the upper-outer quadrant of the right breast. Targeted ultrasound is performed, showing an irregular hypoechoic mass in the right breast 11 o'clock 7 cm from the nipple measuring 7 x 7 x 10 mm. Sonographic evaluation of the right axilla does not show any enlarged adenopathy. IMPRESSION: Suspicious mass in the 11 o'clock region of the right breast. RECOMMENDATION: Ultrasound-guided core biopsy of the mass in the 11 o'clock region of the right breast is recommended. I have discussed the findings and recommendations with the patient. Results were also provided in writing at the conclusion of the visit. If applicable, a reminder letter will be sent to the patient regarding the next appointment. BI-RADS CATEGORY  5: Highly suggestive of malignancy. Electronically Signed   By: Lillia Mountain M.D.   On: 03/04/2019 12:10   Mm Clip Placement Right  Result Date: 03/11/2019 CLINICAL DATA:  Ultrasound-guided core needle biopsy was performed of a suspicious mass 11 o'clock position right breast EXAM: DIAGNOSTIC RIGHT MAMMOGRAM POST ULTRASOUND BIOPSY COMPARISON:  Previous exam(s). FINDINGS:  Mammographic images were obtained following ultrasound guided biopsy of a right breast mass 11 o'clock position. Ribbon shaped biopsy clip is satisfactorily positioned along the anterior aspect of the biopsied mass. IMPRESSION: Satisfactory position of ribbon shaped biopsy clip. Final Assessment: Post Procedure Mammograms for Marker Placement Electronically Signed   By: Manuela Schwartz  Turner M.D.   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 This was found to be concordant by Dr. Curlene Dolphin. Pathology results were discussed with the patient by telephone. The patient reported doing well after the biopsy with tenderness at the site. Post biopsy instructions and care were reviewed and questions were answered. The patient was encouraged to call [facility] for any additional concerns. Surgical consultation has been arranged with Dr. Fanny Khan at [facility] on March 16, 2019. Pathology results reported by Stacie Acres, RN on 03/13/2019. Electronically Signed   By: Curlene Dolphin M.D.   On: 03/13/2019 16:41   Result Date: 03/16/2019 CLINICAL DATA:  Ultrasound-guided core needle biopsy was recommended of a mass in the 11 o'clock position of the right breast. EXAM: ULTRASOUND GUIDED RIGHT BREAST CORE NEEDLE BIOPSY COMPARISON:  Previous exam(s). FINDINGS: I met with the patient and we discussed the procedure of ultrasound-guided biopsy, including benefits and alternatives. We discussed the high likelihood of a successful procedure. We discussed the risks of the procedure, including infection, bleeding, tissue injury, clip migration, and inadequate sampling. Informed written consent was given. The usual time-out protocol was performed  immediately prior to the procedure. Lesion quadrant: Upper outer quadrant Using sterile technique and 1% Lidocaine as local anesthetic, under direct ultrasound visualization, a 12 gauge spring-loaded device was used to perform biopsy of a hypoechoic 1.0 cm mass 11 o'clock position 7 cm from nipple using a lateral approach. At the conclusion of the procedure a ribbon tissue marker clip was deployed into the biopsy cavity. Follow up 2 view mammogram was performed and dictated separately. IMPRESSION: Ultrasound guided biopsy of the right breast. No apparent complications. Electronically Signed: By: Curlene Dolphin M.D. On: 03/11/2019 08:33     ELIGIBLE FOR AVAILABLE RESEARCH PROTOCOL: UPBEAT   ASSESSMENT: 63 y.o. [place], [place] woman status post right breast upper outer quadrant biopsy 03/11/2019 for a clinical t1b N0, stage IA invasive ductal carcinoma, grade 2, estrogen and progesterone receptor negative, HER-2 amplified, with an MIB-1 of 10%.  (1) genetics testing 03/30/2019  (2) definitive surgery to follow  (3) adjuvant chemotherapy to consist of paclitaxel weekly x12 with trastuzumab  (4) trastuzumab to be continued to complete 1 year  (5) adjuvant radiation.    PLAN: I spent approximately 60 minutes face to face with Tyjah with more than 50% of that time spent in counseling and coordination of care. Specifically we reviewed the biology of the patient's diagnosis and the specifics of her situation.  We first reviewed the fact that cancer is not one disease but more than 100 different diseases and that it is important to keep them separate-- otherwise when friends and relatives discuss their own cancer experiences with [place] confusion can result. Similarly we explained that if breast cancer spreads to the bone or liver, the patient would not have bone cancer or liver cancer, but breast cancer in the bone and breast cancer in the liver: one cancer in three places-- not 3 different cancers  which otherwise would have to be treated in 3 different ways.  We discussed the difference between local and systemic therapy. In terms of loco-regional treatment, lumpectomy plus radiation is equivalent to mastectomy as  far as survival is concerned. For this reason we recommend breast conserving surgery.   We then discussed the rationale for systemic therapy. There is some risk that this cancer may have already spread to other parts of her body. Patients frequently ask at this point about bone scans, CAT scans and PET scans to find out if they have occult breast cancer somewhere else. The problem is that in early stage disease we are much more likely to find false positives then true cancers and this would expose the patient to unnecessary procedures as well as unnecessary radiation. Scans cannot answer the question the patient really would like to know, which is whether she has microscopic disease elsewhere in her body. For those reasons we do not recommend them.  Of course we would proceed to aggressive evaluation of any symptoms that might suggest metastatic disease, but that is not the case here.  Next we went over the options for systemic therapy which are anti-estrogens, anti-HER-2 immunotherapy, and chemotherapy. Emmalia does not meet criteria for anti-estrogens. She is a good candidate for anti-HER-2 immunotherapy and chemotherapy  We specifically discussed weekly paclitaxel x12, and trastuzumab initially weekly x12 and then every 3 weeks to complete a number.  We discussed the possible toxicities side effects and complications in detail.  Kearstyn also qualifies for genetics testing. In patients who carry a deleterious mutation [for example in a  BRCA gene], the risk of a new breast cancer developing in the future may be sufficiently great that the patient may choose bilateral mastectomies. However if she wishes to keep her breasts in that situation it is safe to do so. That would require  intensified screening, which generally means not only yearly mammography but a yearly breast MRI as well.   The plan then is for lumpectomy with sentinel lymph node sampling and port placement, followed by chemotherapy with anti-HER-2 treatment.  When the chemotherapy is done she will proceed to adjuvant radiation  I am setting her up for an echocardiogram.  I have also referred her to the upbeat study and recommend that she participate.  I am tentatively scheduling her chemotherapy to start 01/26/2019 and she will meet with our chemotherapy teaching nurse before that date.  Shawntel has a good understanding of the overall plan. She agrees with it. She knows the goal of treatment in her case is cure. She will call with any problems that may develop before her next visit here.    Pairlee Sawtell, Megan Dad, MD  03/30/19 5:28 PM Medical Oncology and Hematology [facility] [address] [place], [place] [address] Tel. [phone]    Fax. [phone]   I, Jacqualyn Posey am acting as a Education administrator for Chauncey Cruel, MD.   I, Lurline Del MD, have reviewed the above documentation for accuracy and completeness, and I agree with the above.

## 2019-03-30 ENCOUNTER — Inpatient Hospital Stay: Payer: Medicare Other

## 2019-03-30 ENCOUNTER — Inpatient Hospital Stay: Payer: Medicare Other | Attending: Oncology | Admitting: Oncology

## 2019-03-30 ENCOUNTER — Other Ambulatory Visit: Payer: Self-pay | Admitting: Genetic Counselor

## 2019-03-30 ENCOUNTER — Other Ambulatory Visit: Payer: Self-pay

## 2019-03-30 ENCOUNTER — Encounter: Payer: Self-pay | Admitting: *Deleted

## 2019-03-30 VITALS — BP 144/89 | HR 60 | Temp 98.3°F | Resp 18 | Ht 61.0 in | Wt 160.1 lb

## 2019-03-30 DIAGNOSIS — C50411 Malignant neoplasm of upper-outer quadrant of right female breast: Secondary | ICD-10-CM | POA: Diagnosis present

## 2019-03-30 DIAGNOSIS — O43199 Other malformation of placenta, unspecified trimester: Secondary | ICD-10-CM

## 2019-03-30 DIAGNOSIS — Z171 Estrogen receptor negative status [ER-]: Secondary | ICD-10-CM

## 2019-03-30 NOTE — Progress Notes (Signed)
START ON PATHWAY REGIMEN - Breast   Paclitaxel Weekly + Trastuzumab Weekly:   Administer weekly:     Paclitaxel      Trastuzumab-xxxx      Trastuzumab-xxxx   **Always confirm dose/schedule in your pharmacy ordering system**  Trastuzumab (Maintenance - NO Loading Dose):   A cycle is every 21 days:     Trastuzumab-xxxx   **Always confirm dose/schedule in your pharmacy ordering system**  Patient Characteristics: Postoperative without Neoadjuvant Therapy (Pathologic Staging), Invasive Disease, Adjuvant Therapy, HER2 Positive, ER Negative/Unknown, Node Negative, pT1b, pN0/N1mi, Chemotherapy Indicated Therapeutic Status: Postoperative without Neoadjuvant Therapy (Pathologic Staging) AJCC Grade: G2 AJCC N Category: pN0 AJCC M Category: cM0 ER Status: Negative (-) AJCC 8 Stage Grouping: IA HER2 Status: Positive (+) Oncotype Dx Recurrence Score: Not Appropriate AJCC T Category: pT1b PR Status: Negative (-) Intent of Therapy: Curative Intent, Discussed with Patient 

## 2019-03-31 ENCOUNTER — Telehealth: Payer: Self-pay | Admitting: Oncology

## 2019-03-31 ENCOUNTER — Other Ambulatory Visit: Payer: Self-pay | Admitting: General Surgery

## 2019-03-31 DIAGNOSIS — C50411 Malignant neoplasm of upper-outer quadrant of right female breast: Secondary | ICD-10-CM

## 2019-03-31 NOTE — Telephone Encounter (Signed)
Spoke with patient to give update to Echo and Chemo education appointment

## 2019-03-31 NOTE — Telephone Encounter (Signed)
Called patient regarding upcoming webex appointment, patient would prefer this to be a walk-in visit. Patient would like to be sent a mailed copy of calender and patient is aware of upcoming appointments as well.

## 2019-04-01 ENCOUNTER — Telehealth: Payer: Self-pay | Admitting: Medical Oncology

## 2019-04-01 NOTE — Telephone Encounter (Signed)
UPBEAT: Referral Dr. Jana Hakim referred patient to study when patient in clinic on 03/30/2019.  Follow-up call to patient regarding study. LVMOM with patient inquiring her interest in study. My contact information provided for patient to return call. Patient thanked.  Maxwell Marion, RN, BSN, [facility] Clinical Research 04/01/2019 2:58 PM

## 2019-04-02 ENCOUNTER — Other Ambulatory Visit: Payer: Self-pay

## 2019-04-02 ENCOUNTER — Inpatient Hospital Stay (HOSPITAL_BASED_OUTPATIENT_CLINIC_OR_DEPARTMENT_OTHER): Payer: Medicare Other | Admitting: Genetic Counselor

## 2019-04-02 ENCOUNTER — Encounter: Payer: Self-pay | Admitting: *Deleted

## 2019-04-02 ENCOUNTER — Encounter: Payer: Self-pay | Admitting: Genetic Counselor

## 2019-04-02 ENCOUNTER — Encounter: Payer: Self-pay | Admitting: Medical Oncology

## 2019-04-02 DIAGNOSIS — Z803 Family history of malignant neoplasm of breast: Secondary | ICD-10-CM

## 2019-04-02 DIAGNOSIS — Z800 Family history of malignant neoplasm of digestive organs: Secondary | ICD-10-CM | POA: Insufficient documentation

## 2019-04-02 DIAGNOSIS — C50411 Malignant neoplasm of upper-outer quadrant of right female breast: Secondary | ICD-10-CM

## 2019-04-02 DIAGNOSIS — Z171 Estrogen receptor negative status [ER-]: Secondary | ICD-10-CM | POA: Diagnosis not present

## 2019-04-02 NOTE — Progress Notes (Signed)
UPBEAT: Referral I met with patient this morning, here alone, after her appointment with genetics. I confirmed with patient that Dr. Jana Hakim had spoken to her about the study. I spoke with patient about the study as well giving her information regarding the expected assessments, including the cardiac MRI. I informed patient of the study time point assessments and the requirement of cardiac MRI's and what this entails. All patient's questions were answered to her satisfaction. Patient states she is "extremely claustrophobic." One of the study exclusion criteria is symptomatic claustrophobia, and this would make patient ineligible for study. Patient gave her understanding. Patient thanked for her time and interest in study.  Maxwell Marion, RN, BSN, [facility] Clinical Research 04/02/2019 11:06 AM

## 2019-04-02 NOTE — Progress Notes (Signed)
REFERRING PROVIDER: Chauncey Cruel, MD [address] [place],  [place] [address]  PRIMARY PROVIDER:  [name], DO  PRIMARY REASON FOR VISIT:  1. Malignant neoplasm of upper-outer quadrant of right breast in female, estrogen receptor negative ([place])   2. Family history of throat cancer   3. Family history of breast cancer      HISTORY OF PRESENT ILLNESS:   Megan Khan, a 63 y.o. female, was seen for a [facility] cancer genetics consultation at the request of Dr. Jana Hakim due to a personal and family history of breast cancer.  Megan Khan presents to clinic today to discuss the possibility of a hereditary predisposition to cancer, genetic testing, and to further clarify her future cancer risks, as well as potential cancer risks for family members.   In 2020, at the age of 49, Megan Khan was diagnosed with IDC, ER-/PR-/Her2+, of the right breast. The treatment plan includes surgery, chemotherapy, radiation, and anti-Her2 therapy.   CANCER HISTORY:  Oncology History  Malignant neoplasm of upper-outer quadrant of right breast in female, estrogen receptor negative ([name])  03/19/2019 Initial Diagnosis   Malignant neoplasm of upper-outer quadrant of right breast in female, estrogen receptor negative ([name])   04/28/2019 -  Chemotherapy   The patient had PACLITAXEL CHEMO IV INFUSION ORDERABLE (</= 80 MG/M2), 80 mg/m2, Intravenous,  Once, 0 of 3 cycles TRASTUZUMAB-DKST CHEMO IV INFUSION, 4 mg/kg, Intravenous,  Once, 0 of 16 cycles  for chemotherapy treatment.       RISK FACTORS:  Menarche was at age 69.  First live birth at age 15.  OCP use for approximately 0 years.  Ovaries intact: yes.  Hysterectomy: no.  Menopausal status: postmenopausal.  HRT use: 0 years. Colonoscopy: yes; 3 polyps. Mammogram within the last year: yes. Number of breast biopsies: 1.   Past Medical History:  Diagnosis Date   Diabetes mellitus    Family history of breast cancer    Family  history of throat cancer    Glaucoma    Hx of colonic polyps    Hyperlipidemia    Hypertension    Legally blind     Past Surgical History:  Procedure Laterality Date   COLONOSCOPY  01/22/2012   Procedure: COLONOSCOPY;  Surgeon: [name], MD;  Location: [facility];  Service: Endoscopy;  Laterality: N/A;   COLONOSCOPY     COLONOSCOPY     TUBAL LIGATION      Social History   Socioeconomic History   Marital status: Single    Spouse name: Not on file   Number of children: Not on file   Years of education: Not on file   Highest education level: Not on file  Occupational History   Not on file  Social Needs   Financial resource strain: Not on file   Food insecurity    Worry: Not on file    Inability: Not on file   Transportation needs    Medical: No    Non-medical: No  Tobacco Use   Smoking status: Former Smoker    Types: Cigarettes    Quit date: 09/23/1995    Years since quitting: 23.5   Smokeless tobacco: Never Used  Substance and Sexual Activity   Alcohol use: No   Drug use: No   Sexual activity: Not on file  Lifestyle   Physical activity    Days per week: Not on file    Minutes per session: Not on file   Stress: Not on file  Relationships   Social Herbalist on phone: Not on file    Gets together: Not on file    Attends religious service: Not on file    Active member of club or organization: Not on file    Attends meetings of clubs or organizations: Not on file    Relationship status: Not on file  Other Topics Concern   Not on file  Social History Narrative   Not on file     FAMILY HISTORY:  We obtained a detailed, 4-generation family history.  Significant diagnoses are listed below: Family History  Problem Relation Age of Onset   Breast cancer Sister 62       Her2 +   Breast cancer Cousin        maternal second cousin, Bilateral mastectomy, diagnosed in her 74s     Megan Khan has a 79 year old  daughter, Megan Khan, a 57 year old son, Megan Khan, an 15 year old adopted son, Megan Khan, and a son who died at age 17 from a gunshot wound, Megan Khan. She has three sisters and five brothers. One of her sisters had breast cancer at age 75. All of her siblings are living except for one brother who died when he was in his late 9s.   Megan Khan mother died at age 30. She had two maternal uncles who died at older ages and had diabetes. Her maternal grandmother died at age 34 from diabetes. Megan Khan is unsure how older her grandfather was when he died, but she does not think he had cancer. Megan Khan also had a maternal cousin once removed who had breast cancer and recently died at age 45.  Megan Khan father died at age 74 and had throat cancer with a history of smoking. She had one paternal aunt and possibly three paternal uncles, but she is unsure exactly how many. Her paternal grandparents died at ages older than 3. Megan Khan does not know of any other cancer diagnoses on her paternal side of the family.  Megan Khan is unaware of previous family history of genetic testing for hereditary cancer risks. Patient's maternal ancestors are of [place] and [place] Native American descent, and paternal ancestors are of [place] American and [place] descent. There is no reported Ashkenazi Jewish ancestry. There is no known consanguinity.  GENETIC COUNSELING ASSESSMENT: Megan Khan is a 63 y.o. female with a personal and family history of breast cancer which is not highly suggestive of a hereditary cancer syndrome and predisposition to cancer. We, therefore, discussed and recommended the following at today's visit.   DISCUSSION: We discussed that 5 - 10% of breast cancer is hereditary, with most cases associated with BRCA1/2.  There are other genes that can be associated with hereditary breast cancer syndromes.  These include ATM, CHEK2, PALB2, etc.  We discussed that identifying hereditary  cancer is beneficial for several reasons including knowing about other cancer risks, identifying potential screening and risk-reduction options that may be appropriate, and to understand if other family members could be at risk for cancer and allow them to undergo genetic testing.   We reviewed the characteristics, features and inheritance patterns of hereditary cancer syndromes. We discussed with Ms. Godsil that the personal and family history does not meet insurance or NCCN criteria for genetic testing and is not highly consistent with a familial hereditary cancer syndrome.  We feel she is at low risk to harbor a gene mutation associated with such a condition. Thus, we  did not recommend any genetic testing, at this time, and recommended Ms. Thayne continue to follow the cancer screening guidelines given by her primary healthcare provider.   PLAN: Ms. Alcivar did not wish to pursue genetic testing at today's visit. We understand this decision and remain available to coordinate genetic testing at any time in the future. We, therefore, recommend Ms. Ostenson continue to follow the cancer screening guidelines given by her primary healthcare provider.  Ms. Matt questions were answered to her satisfaction today. Our contact information was provided should additional questions or concerns arise. Thank you for the referral and allowing [place] to share in the care of your patient.   Clint Guy, MS Genetic Counselor [email or web address] Phone: [phone]   The patient was seen for a total of 25 minutes in face-to-face genetic counseling.  This patient was discussed with Drs. [name], Lindi Adie and/or Burr Medico who agrees with the above.   _______________________________________________________________________ For Office Staff:  Number of people involved in session: 1 Was an Intern/ student involved with case: no

## 2019-04-06 ENCOUNTER — Ambulatory Visit (HOSPITAL_COMMUNITY)
Admission: RE | Admit: 2019-04-06 | Discharge: 2019-04-06 | Disposition: A | Discharge status: Home / Self Care | Payer: Medicare Other | Source: Ambulatory Visit | Attending: Oncology | Admitting: Oncology

## 2019-04-06 ENCOUNTER — Inpatient Hospital Stay: Payer: Medicare Other

## 2019-04-06 ENCOUNTER — Other Ambulatory Visit: Payer: Self-pay

## 2019-04-06 DIAGNOSIS — E785 Hyperlipidemia, unspecified: Secondary | ICD-10-CM | POA: Diagnosis not present

## 2019-04-06 DIAGNOSIS — I10 Essential (primary) hypertension: Secondary | ICD-10-CM | POA: Insufficient documentation

## 2019-04-06 DIAGNOSIS — Z171 Estrogen receptor negative status [ER-]: Secondary | ICD-10-CM

## 2019-04-06 DIAGNOSIS — E119 Type 2 diabetes mellitus without complications: Secondary | ICD-10-CM | POA: Insufficient documentation

## 2019-04-06 DIAGNOSIS — C50411 Malignant neoplasm of upper-outer quadrant of right female breast: Secondary | ICD-10-CM | POA: Diagnosis present

## 2019-04-06 NOTE — Progress Notes (Signed)
  Echocardiogram 2D Echocardiogram has been performed.  Randa Lynn Latoya Diskin 04/06/2019, 10:11 AM

## 2019-04-07 ENCOUNTER — Telehealth: Payer: Self-pay | Admitting: Oncology

## 2019-04-07 NOTE — Telephone Encounter (Signed)
I talk with patient regarding reschedule °

## 2019-04-09 ENCOUNTER — Other Ambulatory Visit: Payer: Self-pay | Admitting: General Surgery

## 2019-04-09 ENCOUNTER — Encounter: Payer: Self-pay | Admitting: *Deleted

## 2019-04-09 DIAGNOSIS — C50411 Malignant neoplasm of upper-outer quadrant of right female breast: Secondary | ICD-10-CM

## 2019-04-10 ENCOUNTER — Telehealth: Payer: Self-pay | Admitting: Family Medicine

## 2019-04-10 ENCOUNTER — Telehealth: Payer: Self-pay | Admitting: *Deleted

## 2019-04-10 ENCOUNTER — Other Ambulatory Visit: Payer: Self-pay

## 2019-04-10 ENCOUNTER — Inpatient Hospital Stay: Payer: Medicare Other

## 2019-04-10 ENCOUNTER — Encounter (HOSPITAL_COMMUNITY): Payer: Self-pay | Admitting: Vascular Surgery

## 2019-04-10 ENCOUNTER — Encounter (HOSPITAL_COMMUNITY): Payer: Self-pay

## 2019-04-10 ENCOUNTER — Encounter (HOSPITAL_COMMUNITY)
Admission: RE | Admit: 2019-04-10 | Discharge: 2019-04-10 | Disposition: A | Discharge status: Home / Self Care | Payer: Medicare Other | Source: Ambulatory Visit | Attending: General Surgery | Admitting: General Surgery

## 2019-04-10 DIAGNOSIS — Z01818 Encounter for other preprocedural examination: Secondary | ICD-10-CM | POA: Insufficient documentation

## 2019-04-10 HISTORY — DX: Anemia, unspecified: D64.9

## 2019-04-10 LAB — BASIC METABOLIC PANEL
Anion gap: 12 (ref 5–15)
BUN: 9 mg/dL (ref 8–23)
CO2: 26 mmol/L (ref 22–32)
Calcium: 9.4 mg/dL (ref 8.9–10.3)
Chloride: 98 mmol/L (ref 98–111)
Creatinine, Ser: 0.74 mg/dL (ref 0.44–1.00)
GFR calc Af Amer: >60 mL/min (ref >60)
GFR calc non Af Amer: >60 mL/min (ref >60)
Glucose, Bld: 236 mg/dL — ABNORMAL HIGH (ref 70–99)
Potassium: 3.2 mmol/L — ABNORMAL LOW (ref 3.5–5.1)
Sodium: 136 mmol/L (ref 135–145)

## 2019-04-10 LAB — CBC
HCT: 44.2 % (ref 36.0–46.0)
Hemoglobin: 14.1 g/dL (ref 12.0–15.0)
MCH: 26.7 pg (ref 26.0–34.0)
MCHC: 31.9 g/dL (ref 30.0–36.0)
MCV: 83.6 fL (ref 80.0–100.0)
Platelets: 308 10*3/uL (ref 150–400)
RBC: 5.29 MIL/uL — ABNORMAL HIGH (ref 3.87–5.11)
RDW: 13.2 % (ref 11.5–15.5)
WBC: 7.3 10*3/uL (ref 4.0–10.5)
nRBC: 0 % (ref 0.0–0.2)

## 2019-04-10 LAB — HEMOGLOBIN A1C
Hgb A1c MFr Bld: 11.8 % — ABNORMAL HIGH (ref 4.8–5.6)
Mean Plasma Glucose: 291.96 mg/dL

## 2019-04-10 LAB — GLUCOSE, CAPILLARY: Glucose-Capillary: 235 mg/dL — ABNORMAL HIGH (ref 70–99)

## 2019-04-10 NOTE — Progress Notes (Signed)
Anesthesia Chart Review:  Case: [id number] Date/Time: 04/14/19 1215   Procedures:      RIGHT BREAST LUMPECTOMY WITH RADIOACTIVE SEED AND SENTINEL LYMPH NODE BIOPSY INJECT BLUE DYE RIGHT BREAST (Right Breast)     INSERTION PORT-A-CATH WITH ULTRASOUND (N/A )   Anesthesia type: General   Pre-op diagnosis: right breast cancer   Location: [facility] OR ROOM 09 / [facility]   Surgeon: Fanny Skates, MD      DISCUSSION: Patient is a 63 year old female scheduled for the above procedure.  History includes former smoker (quit 1997), HTN, HLD, glaucoma, DM2, right breast cancer (diagnosed 03/11/19), glaucoma, legally blind (blind left eye, low vision right eye without peripheral vision).    Random CBG 235. A1c elevated at 11.8% on 04/10/19 consistent with average glucose of 292 (up from 7.5% on 12/20/17). She is on metformin 1000 mg BID. She has a home glucometer, but does not check her CBGs due to pain. Preoperative EKG showed inferior and anterolateral T wave inversions which were more non-specific in 2011.  No chest pain or SOB. Denied prior cardiac testing or seeing a cardiologist.   Reviewed above with anesthesiologist Suzette Battiest, MD. Given new EKG changes that could represent ischemia and multiple CAD risk factors, would recommend preoperative cardiology evaluation. She did have normal LVEF with no regional wall motion abnormalities by pre-chemo echo 04/06/19. Her PCP Dr. Criss Rosales has noted elevated A1c and is asking his staff to contact patient for follow-up. I have notified [facility] triage nurse Timisha regarding A1c of 11.8% and need for cardiology preoperative evaluation for abnormal EKG. Given RSL is scheduled for 04/13/19 at 2:00 PM, surgery may need to be postponed.     VS: BP (!) 150/88   Pulse 73   Temp 36.7 C   Resp 18   Ht '5\' 1"'$  (1.549 m)   Wt 71.8 kg   SpO2 96%   BMI 29.89 kg/m    PROVIDERS: [name], DO is PCP ([place]). Last visit 12/20/17. [name], Sarajane Jews, MD  is HEM-ONC. Last visit 03/30/19. Plan for surgery, genetic testing, adjuvant chemoradiation.    LABS: Preoperative labs noted. DM poorly controlled by A1c results. (all labs ordered are listed, but only abnormal results are displayed)  Labs Reviewed  GLUCOSE, CAPILLARY - Abnormal; Notable for the following components:      Result Value   Glucose-Capillary 235 (*)    All other components within normal limits  BASIC METABOLIC PANEL - Abnormal; Notable for the following components:   Potassium 3.2 (*)    Glucose, Bld 236 (*)    All other components within normal limits  CBC - Abnormal; Notable for the following components:   RBC 5.29 (*)    All other components within normal limits  HEMOGLOBIN A1C - Abnormal; Notable for the following components:   Hgb A1c MFr Bld 11.8 (*)    All other components within normal limits     EKG: 04/10/19: Normal sinus rhythm Possible Left atrial enlargement ST & T wave abnormality, consider inferior ischemia ST & T wave abnormality, consider anterolateral ischemia Abnormal ECG   CV: Echo 04/06/19: IMPRESSIONS  1. The left ventricle has hyperdynamic systolic function, with an ejection fraction of >65%. The cavity size was normal. There is moderately increased left ventricular wall thickness. Left ventricular diastolic Doppler parameters are consistent with  impaired relaxation. Elevated left ventricular end-diastolic pressure The E/e' is >15. No evidence of left ventricular regional wall motion abnormalities.  2. The  right ventricle has normal systolic function. The cavity was normal. There is no increase in right ventricular wall thickness.  3. The mitral valve is grossly normal.  4. The tricuspid valve is grossly normal.  5. The aortic valve is tricuspid. No stenosis of the aortic valve.  6. The aorta is normal unless otherwise noted.   Past Medical History:  Diagnosis Date  . Anemia    during pregnancy  . Cancer [facility]) 2020   breast cancer on  right  . Diabetes mellitus    Type 2  . Family history of breast cancer   . Family history of throat cancer   . Glaucoma   . Hx of colonic polyps   . Hyperlipidemia   . Hypertension   . Legally blind    completely blind in left eye, low vision in right    Past Surgical History:  Procedure Laterality Date  . COLONOSCOPY  01/22/2012   Procedure: COLONOSCOPY;  Surgeon: [name], MD;  Location: [facility];  Service: Endoscopy;  Laterality: N/A;  . COLONOSCOPY    . COLONOSCOPY    . TUBAL LIGATION      MEDICATIONS: . amLODipine (NORVASC) 10 MG tablet  . aspirin 81 MG EC tablet  . bimatoprost (LUMIGAN) 0.01 % SOLN  . blood glucose meter kit and supplies KIT  . dorzolamide (TRUSOPT) 2 % ophthalmic solution  . gabapentin (NEURONTIN) 100 MG capsule  . lisinopril-hydrochlorothiazide (PRINZIDE,ZESTORETIC) 20-25 MG tablet  . lovastatin (MEVACOR) 20 MG tablet  . metFORMIN (GLUCOPHAGE) 1000 MG tablet  . metoprolol succinate (TOPROL-XL) 50 MG 24 hr tablet   No current facility-administered medications for this encounter.     Myra Gianotti, PA-C Surgical Short Stay/Anesthesiology [facility] Phone [phone] [facility] Phone [phone] 04/10/2019 3:07 PM

## 2019-04-10 NOTE — Pre-Procedure Instructions (Signed)
Megan Khan  04/10/2019    Your procedure is scheduled on Tuesday, April 14, 2019 at 12:15 PM.   Report to [facility] Entrance "A" Admitting Office at 10:15 AM.   Call this number if you have problems the morning of surgery: [phone]   Questions prior to day of surgery, please call [phone] between 8 & 4 PM.   Remember:  Do not eat food after midnight Monday, 04/13/19.  You may drink clear liquids until 9:15 AM.  Clear liquids allowed are: Water, Juice (non-citric and without pulp), Carbonated beverages, Clear Tea, Black Coffee only, Plain Jell-O only, Gatorade and Plain Popsicles only    Take these medicines the morning of surgery: Amlodipine (Norvasc), Gabapentin (Neurontin), Metoprolol (Toprol XL), eye drops  Do not take Metformin the morning of surgery.  Stop Aspirin as instructed by physician/surgeon. Do not use NSAIDS (Ibuprofen, Aleve, etc), other Aspirin products (Goody's, BC Powders, etc), Multivitamins or Herbal medications prior to surgery.   How to Manage Your Diabetes Before Surgery   Why is it important to control my blood sugar before and after surgery?   Improving blood sugar levels before and after surgery helps healing and can limit problems.  A way of improving blood sugar control is eating a healthy diet by:  - Eating less sugar and carbohydrates  - Increasing activity/exercise  - Talk with your doctor about reaching your blood sugar goals  High blood sugars (greater than 180 mg/dL) can raise your risk of infections and slow down your recovery so you will need to focus on controlling your diabetes during the weeks before surgery.  Make sure that the doctor who takes care of your diabetes knows about your planned surgery including the date and location.  How do I manage my blood sugars before surgery?   Check your blood sugar at least 4 times a day, 2 days before surgery to make sure that they are not too high or low.  Check  your blood sugar the morning of your surgery when you wake up and every 2 hours until you get to the Short-Stay unit.  Treat a low blood sugar (less than 70 mg/dL) with 1/2 cup of clear juice (cranberry or apple), 4 glucose tablets, OR glucose gel.  Recheck blood sugar in 15 minutes after treatment (to make sure it is greater than 70 mg/dL).  If blood sugar is not greater than 70 mg/dL on re-check, call [phone] for further instructions.   Report your blood sugar to the Short-Stay nurse when you get to Short-Stay.  References:  [facility], 2007 "How to Manage your Diabetes Before and After Surgery".   Do not wear jewelry, make-up or nail polish.  Do not wear lotions, powders, perfumes or deodorant.  Do not shave 48 hours prior to surgery.    Do not bring valuables to the hospital.  [facility] is not responsible for any belongings or valuables.  Contacts, dentures or bridgework may not be worn into surgery.  Leave your suitcase in the car.  After surgery it may be brought to your room.  For patients admitted to the hospital, discharge time will be determined by your treatment team.  Patients discharged the day of surgery will not be allowed to drive home.   [place] - Preparing for Surgery  Before surgery, you can play an important role.  Because skin is not sterile, your skin needs to be as free of germs as possible.  You  can reduce the number of germs on you skin by washing with CHG (chlorahexidine gluconate) soap before surgery.  CHG is an antiseptic cleaner which kills germs and bonds with the skin to continue killing germs even after washing.  Oral Hygiene is also important in reducing the risk of infection.  Remember to brush your teeth with your regular toothpaste the morning of surgery.  Please DO NOT use if you have an allergy to CHG or antibacterial soaps.  If your skin becomes reddened/irritated stop using the CHG and inform your nurse when you  arrive at Short Stay.  Do not shave (including legs and underarms) for at least 48 hours prior to the first CHG shower.  You may shave your face.  Please follow these instructions carefully:   1.  Shower with CHG Soap the night before surgery and the morning of Surgery.  2.  If you choose to wash your hair, wash your hair first as usual with your normal shampoo.  3.  After you shampoo, rinse your hair and body thoroughly to remove the shampoo. 4.  Use CHG as you would any other liquid soap.  You can apply chg directly to the skin and wash gently with a      scrungie or washcloth.           5.  Apply the CHG Soap to your body ONLY FROM THE NECK DOWN.   Do not use on open wounds or open sores. Avoid contact with your eyes, ears, mouth and genitals (private parts).  Wash genitals (private parts) with your normal soap - do this prior to using CHG soap.  6.  Wash thoroughly, paying special attention to the area where your surgery will be performed.  7.  Thoroughly rinse your body with warm water from the neck down.  8.  DO NOT shower/wash with your normal soap after using and rinsing off the CHG Soap.  9.  Pat yourself dry with a clean towel.            10.  Wear clean pajamas.            11.  Place clean sheets on your bed the night of your first shower and do not sleep with pets.  Day of Surgery  Shower as above. Do not apply any lotions/deodorants the morning of surgery.   Please wear clean clothes to the hospital. Remember to brush your teeth with toothpaste.   Please read over the fact sheets that you were given.

## 2019-04-10 NOTE — Telephone Encounter (Signed)
Contacted pt and she stated she would have to call back to schedule after her surgery on 04/14/2019.  She said she would call when she was feeling up to it after some recovery time.Lupe Bonner Zimmerman Rumple, CMA

## 2019-04-10 NOTE — Telephone Encounter (Signed)
Please call and schedule patient with either myself or Dr. Valentina Lucks to discuss diabetes management.  Her recent A1C was increased from prior.  -Dr. Criss Rosales

## 2019-04-10 NOTE — Progress Notes (Addendum)
PCP - Dr. [name] Cardiologist - denies  Chest x-ray - N/A EKG - today Stress Test - denies ECHO - 04/06/19 Cardiac Cath - denies  Sleep Study - denies CPAP -   Fasting Blood Sugar - does not check at home - states it hurts too much Checks Blood Sugar _____ times a day  Blood Thinner Instructions: N/A Aspirin Instructions: pt states Dr. [name] office told her to stay on her Aspirin  Anesthesia review: Yes - EKG, A1C  Patient denies shortness of breath, fever, cough and chest pain at PAT appointment   Patient verbalized understanding of instructions that were given to them at the PAT appointment. Patient was also instructed that they will need to review over the PAT instructions again at home before surgery.  Pt will get her Covid Test done tomorrow. Pt voices understanding of the need to quarantine after the test is done.   Coronavirus Screening  Have you experienced the following symptoms:  Cough yes/no: No Fever (>100.22F)  yes/no: No Runny nose yes/no: No Sore throat yes/no: No Difficulty breathing/shortness of breath  yes/no: No  Have you or a family member traveled in the last 14 days and where? yes/no: No   Patient reminded that hospital visitation restrictions are in effect and the importance of the restrictions. Pt informed that she may have one visitor wait in the waiting area while she is in pre-op, surgery and PACU. Pt voiced understanding.

## 2019-04-10 NOTE — Telephone Encounter (Signed)
Received notification from Dr. Darrel Hoover office that surgery will be delayed d/t cardiology clearance and hgb A1c. Post-op with Dr. Jana Hakim and 1st chemo cancelled. Will r/s accordingly with new sx date.

## 2019-04-11 ENCOUNTER — Other Ambulatory Visit (HOSPITAL_COMMUNITY): Payer: Medicaid Other

## 2019-04-13 ENCOUNTER — Inpatient Hospital Stay: Admission: RE | Admit: 2019-04-13 | Payer: Medicaid Other | Source: Ambulatory Visit

## 2019-04-14 ENCOUNTER — Ambulatory Visit: Admit: 2019-04-14 | Payer: Medicaid Other | Admitting: General Surgery

## 2019-04-14 ENCOUNTER — Other Ambulatory Visit (HOSPITAL_COMMUNITY): Payer: Medicaid Other

## 2019-04-14 ENCOUNTER — Other Ambulatory Visit: Payer: Self-pay

## 2019-04-14 ENCOUNTER — Ambulatory Visit (HOSPITAL_COMMUNITY): Payer: Medicaid Other

## 2019-04-14 ENCOUNTER — Ambulatory Visit (INDEPENDENT_AMBULATORY_CARE_PROVIDER_SITE_OTHER): Payer: Medicare Other | Admitting: Family Medicine

## 2019-04-14 VITALS — BP 140/70 | HR 69

## 2019-04-14 DIAGNOSIS — E1122 Type 2 diabetes mellitus with diabetic chronic kidney disease: Secondary | ICD-10-CM

## 2019-04-14 DIAGNOSIS — N181 Chronic kidney disease, stage 1: Secondary | ICD-10-CM | POA: Diagnosis not present

## 2019-04-14 DIAGNOSIS — H4010X3 Unspecified open-angle glaucoma, severe stage: Secondary | ICD-10-CM

## 2019-04-14 SURGERY — BREAST LUMPECTOMY WITH RADIOACTIVE SEED AND SENTINEL LYMPH NODE BIOPSY
Anesthesia: General | Site: Breast | Laterality: Right

## 2019-04-15 NOTE — Progress Notes (Signed)
    Subjective:  Megan Khan is a 63 y.o. female who presents to the [facility] today with a chief complaint of diabetes control.   HPI: Diabetes mellitus, type 2 ([place]) During incidental work-up for patient's breast cancer, found A1c up to 11.8 from last 7.5.  Patient has been consistent in the past of not wanting more medicine than metformin.  Did discuss that she actually has trouble seeing her glucometer due to her severe visual impairment.  She said that she controlled her diabetes down to the sevens with diet before and she can do it again, says she has been stress eating because of the concern of everything going on with her breast cancer.  Did offer to teach her Trulicity which we thought she might be able to manage visually and patient declined.  Open-angle glaucoma of both eyes, severe stage Patient following ophthalmology for treatment, poor vision is impacting her ability to check her blood sugars since she has not been doing if she cannot see them.  Objective:  Physical Exam: BP 140/70   Pulse 69   SpO2 98%   Gen: NAD, resting comfortably CV: RRR with no murmurs appreciated Pulm: NWOB, CTAB with no crackles, wheezes, or rhonchi MSK: no edema, cyanosis, or clubbing noted Skin: warm, dry Neuro: grossly normal, moves all extremities Psych: Normal affect and thought content  No results found for this or any previous visit (from the past 72 hour(s)).   Assessment/Plan:  Open-angle glaucoma of both eyes, severe stage Patient following ophthalmology for treatment, poor vision is impacting her ability to check her blood sugars since she has not been doing if she cannot see them.  Will talk with our nursing team about attempting to get glucometer for the visual impaired  Diabetes mellitus, type 2 ([place]) During incidental work-up for patient's breast cancer, found A1c up to 11.8 from last 7.5.  Patient has been consistent in the past of not wanting more medicine than metformin.   Did discuss that she actually has trouble seeing her glucometer due to her severe visual impairment.  She said that she controlled her diabetes down to the sevens with diet before and she can do it again, says she has been stress eating because of the concern of everything going on with her breast cancer.  Did offer to teach her Trulicity which we thought she might be able to manage visually and patient declined.  [name].   [name], DO FAMILY MEDICINE RESIDENT - PGY3 04/17/2019 9:40 AM

## 2019-04-17 NOTE — Assessment & Plan Note (Signed)
Patient following ophthalmology for treatment, poor vision is impacting her ability to check her blood sugars since she has not been doing if she cannot see them.  Will talk with our nursing team about attempting to get glucometer for the visual impaired

## 2019-04-17 NOTE — Assessment & Plan Note (Signed)
During incidental work-up for patient's breast cancer, found A1c up to 11.8 from last 7.5.  Patient has been consistent in the past of not wanting more medicine than metformin.  Did discuss that she actually has trouble seeing her glucometer due to her severe visual impairment.  She said that she controlled her diabetes down to the sevens with diet before and she can do it again, says she has been stress eating because of the concern of everything going on with her breast cancer.  Did offer to teach her Trulicity which we thought she might be able to manage visually and patient declined.  [name].

## 2019-04-21 ENCOUNTER — Other Ambulatory Visit: Payer: Self-pay

## 2019-04-21 ENCOUNTER — Encounter: Payer: Self-pay | Admitting: Cardiovascular Disease

## 2019-04-21 ENCOUNTER — Ambulatory Visit (INDEPENDENT_AMBULATORY_CARE_PROVIDER_SITE_OTHER): Payer: Medicare Other | Admitting: Cardiovascular Disease

## 2019-04-21 DIAGNOSIS — E782 Mixed hyperlipidemia: Secondary | ICD-10-CM

## 2019-04-21 DIAGNOSIS — Z01818 Encounter for other preprocedural examination: Secondary | ICD-10-CM | POA: Insufficient documentation

## 2019-04-21 DIAGNOSIS — I10 Essential (primary) hypertension: Secondary | ICD-10-CM

## 2019-04-21 NOTE — Assessment & Plan Note (Signed)
History of essential hypertension blood pressure measured today at 151/94.  She is on amlodipine, lisinopril and hydrochlorothiazide.

## 2019-04-21 NOTE — Assessment & Plan Note (Signed)
History of hyperlipidemia on lovastatin followed by her PCP 

## 2019-04-21 NOTE — Patient Instructions (Signed)
Medication Instructions:  Your physician recommends that you continue on your current medications as directed. Please refer to the Current Medication list given to you today.  If you need a refill on your cardiac medications before your next appointment, please call your pharmacy.   Lab work: NONE If you have labs (blood work) drawn today and your tests are completely normal, you will receive your results only by: Marland Kitchen MyChart Message (if you have MyChart) OR . A paper copy in the mail If you have any lab test that is abnormal or we need to change your treatment, we will call you to review the results.  Testing/Procedures: NONE  Follow-Up: At [facility], you and your health needs are our priority.  As part of our continuing mission to provide you with exceptional heart care, we have created designated Provider Care Teams.  These Care Teams include your primary Cardiologist (physician) and Advanced Practice Providers (APPs -  Physician Assistants and Nurse Practitioners) who all work together to provide you with the care you need, when you need it. . You MAY SCHEDULE a follow up appointment AS NEEDED.  You may see Dr. Gwenlyn Found or one of the following Advanced Practice Providers on your designated Care Team:   . Kerin Ransom, PA-C . Daleen Snook Kroeger, PA-C . Sande Rives, PA-C . Almyra Deforest, PA-C . Fabian Sharp, PA-C . Jory Sims, DNP . Rosaria Ferries, PA-C   Any Other Special Instructions Will Be Listed Below (If Applicable). You have been cleared at low risk, from a cardiac standpoint, for your upcoming procedure.

## 2019-04-21 NOTE — Progress Notes (Signed)
04/21/2019 Megan Khan   1955/11/02  [id number]  Primary Physician [name], DO Primary Cardiologist: Lorretta Harp MD Lupe Carney, [place]  HPI:  Megan Khan is a 63 y.o. moderately overweight divorced African-American female mother of 3 children, grandmother 7 grandchildren who was doing factory work in the past.  She was referred by Dr. Dalbert Batman, her general surgeon, for preoperative clearance before breast surgery (lumpectomy and lymph node resection).  Risk factors include treated hypertension hyperlipidemia.  Her mother did have CABG.  She is never had a heart attack or stroke.  She denies chest pain or shortness of breath.  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  She had an uncomplicated hernia repair 2 years ago.  She was diagnosed with breast cancer 02/13/2019 is followed by Dr. Jana Hakim and Dalbert Batman.  A 2D echo performed 04/06/2019 was entirely normal.  Apparently she she needs surgical resection and is here for preoperative clearance.   Current Meds  Medication Sig  . amLODipine (NORVASC) 10 MG tablet TAKE 1 TABLET(10 MG) BY MOUTH DAILY  . aspirin 81 MG EC tablet Take 81 mg by mouth daily.    . bimatoprost (LUMIGAN) 0.01 % SOLN Place 1 drop into the left eye at bedtime.  . blood glucose meter kit and supplies KIT Dispense based on patient and insurance preference. Use up to four times daily as directed. (FOR ICD-9 250.00, 250.01).  . dorzolamide (TRUSOPT) 2 % ophthalmic solution Place 1 drop into the left eye 3 (three) times daily.  Marland Kitchen gabapentin (NEURONTIN) 100 MG capsule TAKE 2 CAPSULES(200 MG) BY MOUTH THREE TIMES DAILY (Patient taking differently: Take 200 mg by mouth 3 (three) times daily. )  . lisinopril-hydrochlorothiazide (PRINZIDE,ZESTORETIC) 20-25 MG tablet TAKE 1 TABLET BY MOUTH DAILY  . lovastatin (MEVACOR) 20 MG tablet TAKE 1 TABLET BY MOUTH EVERY NIGHT AT BEDTIME (Patient taking differently: Take 20 mg by mouth at  bedtime. )  . metFORMIN (GLUCOPHAGE) 1000 MG tablet TAKE 1 TABLET BY MOUTH TWICE DAILY WITH MEALS (Patient taking differently: Take 1,000 mg by mouth 2 (two) times daily. )  . metoprolol succinate (TOPROL-XL) 50 MG 24 hr tablet TAKE 1 TABLET(50 MG) BY MOUTH DAILY     Allergies  Allergen Reactions  . Sulfamethoxazole Rash    REACTION: Questionable allergy ?    Social History   Socioeconomic History  . Marital status: Single    Spouse name: Not on file  . Number of children: Not on file  . Years of education: Not on file  . Highest education level: Not on file  Occupational History  . Not on file  Social Needs  . Financial resource strain: Not on file  . Food insecurity    Worry: Not on file    Inability: Not on file  . Transportation needs    Medical: No    Non-medical: No  Tobacco Use  . Smoking status: Former Smoker    Types: Cigarettes    Quit date: 09/23/1995    Years since quitting: 23.5  . Smokeless tobacco: Never Used  Substance and Sexual Activity  . Alcohol use: No  . Drug use: No  . Sexual activity: Not on file  Lifestyle  . Physical activity    Days per week: Not on file    Minutes per session: Not on file  . Stress: Not on file  Relationships  . Social Herbalist on  phone: Not on file    Gets together: Not on file    Attends religious service: Not on file    Active member of club or organization: Not on file    Attends meetings of clubs or organizations: Not on file    Relationship status: Not on file  . Intimate partner violence    Fear of current or ex partner: Not on file    Emotionally abused: Not on file    Physically abused: Not on file    Forced sexual activity: Not on file  Other Topics Concern  . Not on file  Social History Narrative  . Not on file     Review of Systems: General: negative for chills, fever, night sweats or weight changes.  Cardiovascular: negative for chest pain, dyspnea on exertion, edema, orthopnea,  palpitations, paroxysmal nocturnal dyspnea or shortness of breath Dermatological: negative for rash Respiratory: negative for cough or wheezing Urologic: negative for hematuria Abdominal: negative for nausea, vomiting, diarrhea, bright red blood per rectum, melena, or hematemesis Neurologic: negative for visual changes, syncope, or dizziness All other systems reviewed and are otherwise negative except as noted above.    Blood pressure (!) 151/94, pulse 71, temperature 97.7 F (36.5 C), height '5\' 1"'$  (1.549 m), weight 159 lb (72.1 kg), SpO2 98 %.  General appearance: alert and no distress Neck: no adenopathy, no carotid bruit, no JVD, supple, symmetrical, trachea midline and thyroid not enlarged, symmetric, no tenderness/mass/nodules Lungs: clear to auscultation bilaterally Heart: regular rate and rhythm, S1, S2 normal, no murmur, click, rub or gallop Extremities: extremities normal, atraumatic, no cyanosis or edema Pulses: 2+ and symmetric Skin: Skin color, texture, turgor normal. No rashes or lesions Neurologic: Alert and oriented X 3, normal strength and tone. Normal symmetric reflexes. Normal coordination and gait  EKG sinus rhythm at 71 with nonspecific ST and T wave changes.  I personally reviewed this EKG.  ASSESSMENT AND PLAN:   Hyperlipidemia History of hyperlipidemia on lovastatin followed by her PCP  HYPERTENSION, BENIGN SYSTEMIC History of essential hypertension blood pressure measured today at 151/94.  She is on amlodipine, lisinopril and hydrochlorothiazide.  Preoperative clearance Megan Khan was referred to me by Dr. Dalbert Batman for preoperative clearance prior to breast surgery/lumpectomy.  She was diagnosed with breast cancer 02/13/2019.  Plan is to do surgical resection followed by chemotherapy.  She sees Dr. Jana Hakim.  A 2D echo performed 04/06/2019 was entirely normal.  She did have an uncomplicated hernia repair 2 years ago.  Her cardiac risk factors are notable for  treated hypertension hyperlipidemia.  She is never had a heart attack or stroke.  She denies chest pain or shortness of breath.  An EKG that showed nonspecific changes.  I am going to clear her at low risk without the need for functional study.      Lorretta Harp MD FACP,FACC,FAHA, [facility] 04/21/2019 2:01 PM

## 2019-04-21 NOTE — Assessment & Plan Note (Signed)
Ms. Gavaghan was referred to me by Dr. Dalbert Batman for preoperative clearance prior to breast surgery/lumpectomy.  She was diagnosed with breast cancer 02/13/2019.  Plan is to do surgical resection followed by chemotherapy.  She sees Dr. Jana Hakim.  A 2D echo performed 04/06/2019 was entirely normal.  She did have an uncomplicated hernia repair 2 years ago.  Her cardiac risk factors are notable for treated hypertension hyperlipidemia.  She is never had a heart attack or stroke.  She denies chest pain or shortness of breath.  An EKG that showed nonspecific changes.  I am going to clear her at low risk without the need for functional study.

## 2019-04-23 ENCOUNTER — Ambulatory Visit: Payer: Medicaid Other | Admitting: Oncology

## 2019-04-23 ENCOUNTER — Encounter: Payer: Self-pay | Admitting: *Deleted

## 2019-04-25 ENCOUNTER — Other Ambulatory Visit: Payer: Self-pay | Admitting: Family Medicine

## 2019-04-25 DIAGNOSIS — G8929 Other chronic pain: Secondary | ICD-10-CM

## 2019-04-27 ENCOUNTER — Other Ambulatory Visit: Payer: Self-pay | Admitting: Family Medicine

## 2019-04-27 ENCOUNTER — Telehealth: Payer: Self-pay | Admitting: Family Medicine

## 2019-04-27 NOTE — Telephone Encounter (Signed)
Patient called to see if she could get a refill on her gabapentin, and metoprolol. Please give pt a call.

## 2019-04-28 ENCOUNTER — Telehealth: Payer: Self-pay | Admitting: Licensed Clinical Social Worker

## 2019-04-28 ENCOUNTER — Ambulatory Visit: Payer: Medicaid Other

## 2019-04-28 ENCOUNTER — Other Ambulatory Visit: Payer: Medicaid Other

## 2019-04-28 ENCOUNTER — Other Ambulatory Visit (HOSPITAL_COMMUNITY): Payer: Medicaid Other

## 2019-04-28 ENCOUNTER — Other Ambulatory Visit: Payer: Self-pay | Admitting: General Surgery

## 2019-04-28 DIAGNOSIS — C50411 Malignant neoplasm of upper-outer quadrant of right female breast: Secondary | ICD-10-CM

## 2019-04-28 NOTE — Telephone Encounter (Signed)
Care Coordination Phone outreach Note Social Work   04/28/2019 Name: Megan Khan MRN: [id number] DOB: October 03, 1955  Megan Khan is a 63 y.o. year old female who sees [name], DO for primary care. LCSW was consulted by patient for assistance with getting her Glucose meter. Per patient PCP was going to order one for her.  Review of notes in chart from PCP indicated he would try to get her a visually impaired glucometer.  Patient asked LCSW to follow up with request to see if it has been ordered. Intervention and plan : message sent to Nurses clinic to F/U with patient.   Casimer Lanius, LCSW Clinical Social Worker [place] [facility] [place]   [phone] 3:02 PM

## 2019-04-29 NOTE — Telephone Encounter (Signed)
Megan Khan,  I called [facility] outpatient, they do not bill insurance for talking meters and suggest that I call [facility], [facility], [facility] or a pharmacy like that.  Walled pt preferred pharmacy [facility]) they do not have access to purchase talking meters but did give me some names of talking meters.  They are:  - [place]   She suggest checking with [facility] or another chain pharmacy as they may have different vendors.   Can suggestions so we are not calling all over [place]? Christen Bame, CMA

## 2019-04-29 NOTE — Telephone Encounter (Signed)
Informed pt of below and she stated that the pharmacy told her to call us because they had reached out to [place] and had not heard anything from [place]. She did not have anything that she needed to speak to a doctor about. Megan Khan, CMA

## 2019-04-30 ENCOUNTER — Telehealth: Payer: Self-pay | Admitting: Family Medicine

## 2019-04-30 ENCOUNTER — Encounter: Payer: Self-pay | Admitting: Medical Oncology

## 2019-04-30 DIAGNOSIS — Z171 Estrogen receptor negative status [ER-]: Secondary | ICD-10-CM

## 2019-04-30 DIAGNOSIS — C50411 Malignant neoplasm of upper-outer quadrant of right female breast: Secondary | ICD-10-CM

## 2019-04-30 NOTE — Telephone Encounter (Signed)
Patient found a company that can get her a 'talking' glucose meter.  [place] will be calling us to get a prescription for this.  She would like to go with this as Medicare will approve.

## 2019-04-30 NOTE — Research (Signed)
DCP-001: [place]: Use of a Clinical Trial Screening Tool to Address Cancer Health Disparities in the [place] Program Patient was referred to [place] study by Dr. Jana Hakim and is eligible for participating in DCP-001. I called patient and spoke with her over the phone. I reminded her of our meeting for the [place] study and informed her of her eligibility for DCP-001 should she be interested in participating. I provided patient with detailed information about the DCP-001 study and explained to her that the study involves a one-time consent, in which I can obtain over the phone, and collection of demographic variables, with the majority of data collected from her medical record. I noted to the patient that no patient identifiers are being reported by the screening tool. Patient was informed that participation in the study is completely voluntary. Patient was made aware that there are no benefits of her participating in this study, however, her information is helping researchers understand why people participate in clinical trials. I reviewed with patient the study consent form and after all of her questions were answered to her satisfaction, patient agreed to participate in the DCP-001 study. I informed patient that I will mail her a copy of the consent form for her records.  Patient meets eligibility and will be enrolled to the study.  Patient was thanked for her time and participation in study and encouraged to call me with questions.  Maxwell Marion, RN, BSN, [facility] Clinical Research 04/30/2019 3:08 PM

## 2019-05-01 ENCOUNTER — Telehealth: Payer: Self-pay | Admitting: Oncology

## 2019-05-01 NOTE — Telephone Encounter (Signed)
Returned patient's phone call regarding September appointment, transferred patient to speak with providers nurse for further questions.

## 2019-05-04 ENCOUNTER — Telehealth: Payer: Self-pay | Admitting: *Deleted

## 2019-05-04 NOTE — Telephone Encounter (Signed)
Message left by pt stating she was called with an appointment for lab and chemo tomorrow- " but I haven't had my surgery or a port placed "  Per 8/17 dictation by MD : The plan then is for lumpectomy with sentinel lymph node sampling and port placement, followed by chemotherapy with anti-HER-2 treatment.  When the chemotherapy is done she will proceed to adjuvant radiation   This note will be forwarded to [facility] for review and return call to pt at given number of [phone].

## 2019-05-04 NOTE — Telephone Encounter (Signed)
Form is in PCP's box.    Will need to include a script and also progress notes that supports that she is legally blind. Christen Bame, CMA

## 2019-05-04 NOTE — Telephone Encounter (Signed)
Called pt to discuss future appts Cancelled all lab, flush and chemo apppts as pt has not had sx yet d/t abnormal EKG and A1c. Informed pt that we will r/s appts after sx. Received verbal understanding. Denies further needs at this time.

## 2019-05-05 ENCOUNTER — Inpatient Hospital Stay: Payer: Medicare Other

## 2019-05-05 ENCOUNTER — Other Ambulatory Visit: Payer: Self-pay | Admitting: *Deleted

## 2019-05-05 DIAGNOSIS — E1169 Type 2 diabetes mellitus with other specified complication: Secondary | ICD-10-CM

## 2019-05-05 DIAGNOSIS — E785 Hyperlipidemia, unspecified: Secondary | ICD-10-CM

## 2019-05-05 MED ORDER — METFORMIN HCL 1000 MG PO TABS: 1000.0000 mg | ORAL_TABLET | Freq: Two times a day (BID) | ORAL | 1 refills | Status: DC

## 2019-05-05 MED ORDER — AMLODIPINE BESYLATE 10 MG PO TABS: ORAL_TABLET | ORAL | 3 refills | Status: DC

## 2019-05-05 MED ORDER — LISINOPRIL-HYDROCHLOROTHIAZIDE 20-25 MG PO TABS: 1.0000 | ORAL_TABLET | Freq: Every day | ORAL | 3 refills | Status: DC

## 2019-05-05 NOTE — Telephone Encounter (Signed)
Form, Script and notes placed in to be faxed pile.  Copy placed in batch scanning.  Christen Bame, CMA

## 2019-05-06 ENCOUNTER — Ambulatory Visit
Admission: RE | Admit: 2019-05-06 | Discharge: 2019-05-06 | Disposition: A | Discharge status: Home / Self Care | Payer: Medicare Other | Source: Ambulatory Visit | Attending: General Surgery | Admitting: General Surgery

## 2019-05-06 ENCOUNTER — Other Ambulatory Visit: Payer: Self-pay

## 2019-05-06 DIAGNOSIS — C50411 Malignant neoplasm of upper-outer quadrant of right female breast: Secondary | ICD-10-CM

## 2019-05-07 NOTE — Telephone Encounter (Signed)
[employer] [facility] called back.  Paperwork not received.  They ask that I fax it to [phone] and [phone].    Done as requested.  Christen Bame, CMA

## 2019-05-07 NOTE — Telephone Encounter (Signed)
Received call.  They need a PECOS certified provider to sign form.  Had Dr. Nori Riis sign and faxed back. Christen Bame, CMA

## 2019-05-11 ENCOUNTER — Telehealth: Payer: Self-pay | Admitting: Oncology

## 2019-05-11 NOTE — Telephone Encounter (Signed)
Scheduled appt per 9/24 sch message - pt is aware of appt date and time.

## 2019-05-12 ENCOUNTER — Other Ambulatory Visit: Payer: Medicaid Other

## 2019-05-12 ENCOUNTER — Ambulatory Visit: Payer: Medicaid Other

## 2019-05-18 ENCOUNTER — Telehealth: Payer: Self-pay | Admitting: *Deleted

## 2019-05-19 ENCOUNTER — Other Ambulatory Visit: Payer: Medicaid Other

## 2019-05-19 ENCOUNTER — Ambulatory Visit: Payer: Medicaid Other

## 2019-05-19 NOTE — H&P (Signed)
Megan Khan Location: [facility] Patient #: [id number] DOB: February 18, 1956 Single / Language: [name] / Race: Black or African American Female       History of Present Illness       This is a very pleasant 63 year old woman for who returns for a preop visit in preparation for her definitive right breast cancer surgery. Dr. Jana Hakim involved in her care. Radiology is at [place]. PCP is Dr. [name]. Dr. Quay Burow from Cardiology has seen her and cleared her for breast surgery with low risk.      Recent mammogram show a 10 mm mass in the right breast, 11 o'clock position, 7 cm from the nipple. Axillary ultrasound negative. 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 She has seen Dr. Jana Hakim. She has been discussed in breast conference.  Chemotherapy is recommended and the patient agrees to that   She has declined genetic testing, I believe.,      Comorbidities include legal blindness. Non-insulin-dependent diabetes. Hypertension.  One sister was treated for breast cancer by Dr. Jana Hakim and Dr. Barry Dienes. A maternal cousin was also treated for breast cancer by me. Mother deceased with diabetes. Father deceased with dementia Social history reveals she is single. Lives with her son. Denies tobacco or echo call. PCP at [facility].       We will proceed with scheduling for Port-A-Cath insertion with ultrasound, inject blue dye right breast, right breast lumpectomy with RSL, right axillarty deep SLN and biopsy. I have once again discussed indications, details, techniques and risk of the surgery with her in detail. She is aware of the risk of bleeding, infection, pneumothorax, bilateral attempts at Port-A-Cath, breast infection or bleeding. Chronic pain. Cosmetic deformity, reoperation for positive margins or positive nodes. Arm swelling. Shoulder disability. She understands all of this. All of her questions are  answered. She agrees with this plan.   Past Surgical History No pertinent past surgical history   Diagnostic Studies History  Colonoscopy  1-5 years ago Mammogram  within last year 1-3 years ago Pap Smear  1-5 years ago  Medication History  AmLODIPine Besylate (10MG Tablet, Oral) Active. Aspirin (81MG Tablet Chewable, Oral) Active. Lisinopril-Hydrochlorothiazide (20-25MG Tablet, Oral) Active. Lovastatin (20MG Tablet, Oral) Active. MetFORMIN HCl (1000MG Tablet, Oral) Active. Metoprolol Succinate ER (50MG Tablet ER 24HR, Oral) Active. Travatan Z (0.004% Solution, Ophthalmic) Active. Medications Reconciled  Social History  Alcohol use  Remotely quit alcohol use. Caffeine use  Coffee, Tea. Illicit drug use  Remotely quit drug use. Tobacco use  Former smoker.  Family History  Alcohol Abuse  Brother, Father, Sister. Breast Cancer  Sister. Diabetes Mellitus  Mother, Sister. Heart Disease  Brother, Mother. Hypertension  Brother, Father, Mother, Sister. Melanoma  Mother.  Pregnancy / Birth History Age at menarche  71 years, 47 years. Age of menopause  <46 Contraceptive History  Oral contraceptives. Gravida  3 Irregular periods  Length (months) of breastfeeding  3-6 Maternal age  52-20 Para  3  Other Problems  Diabetes Mellitus  High blood pressure   Vitals  Weight: 158.38 lb Height: 61in Body Surface Area: 1.71 m Body Mass Index: 29.92 kg/m  Temp.: 97.81F  Pulse: 87 (Regular)  P.OX: 97% (Room air) BP: 140/98(Sitting, Left Arm, Standard)     Physical Exam  General Mental Status-Alert. General Appearance-Not in acute distress. Build & Nutrition-Well nourished. Posture-Normal posture. Gait-Normal.  Head and Neck Head-normocephalic, atraumatic with no lesions or palpable masses. Trachea-midline. Thyroid Gland  Characteristics - normal size and consistency and no palpable nodules.  Chest and Lung  Exam Chest and lung exam reveals -on auscultation, normal breath sounds, no adventitious sounds and normal vocal resonance.  Breast Note: Breasts are symmetrical. Ecchymoses right breast that resolved. There may be a tiny amount of thickening in the upper outer quadrant which may be the cancer or hematoma. No other changes. No axillary adenopathy   Cardiovascular Cardiovascular examination reveals -normal heart sounds, regular rate and rhythm with no murmurs and femoral artery auscultation bilaterally reveals normal pulses, no bruits, no thrills.  Abdomen Inspection Inspection of the abdomen reveals - No Hernias. Palpation/Percussion Palpation and Percussion of the abdomen reveal - Soft, Non Tender, No Rigidity (guarding), No hepatosplenomegaly and No Palpable abdominal masses.  Neurologic Neurologic evaluation reveals -alert and oriented x 3 with no impairment of recent or remote memory, normal attention span and ability to concentrate, normal sensation and normal coordination.  Musculoskeletal Normal Exam - Bilateral-Upper Extremity Strength Normal and Lower Extremity Strength Normal.    Assessment & Plan   PRIMARY CANCER OF UPPER OUTER QUADRANT OF RIGHT FEMALE BREAST (C50.411)   You have been diagnosed with a hormone receptor negative, HER-2 positive cancer in the right breast upper outer quadrant. This appears localized you have seen Dr. Jana Hakim and he has advised Port-A-Cath insertion and chemotherapy and you agree   you will be scheduled for Port-A-Cath insertion with ultrasound, inject blue dye right breast, right breast lumpectomy with radioactive seed localization and right axillary sentinel lymph node biopsy Dr. 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 Dalbert Batman, M.D., [facility] General and Minimally invasive Surgery Breast and Colorectal Surgery Office:   [phone] Pager:   [phone]

## 2019-05-22 NOTE — Progress Notes (Signed)
[facility] [place], [place] [address] [place] [address] Phone: [phone] Fax: [phone]      Your procedure is scheduled on Friday, Oct. 16th.  Report to [facility] Main Entrance "A" at 5:30 A.M., and check in at the Admitting office.  Call this number if you have problems the morning of surgery:  [phone]  Call [phone] if you have any questions prior to your surgery date Monday-Friday 8am-4pm    Remember:  Do not eat after midnight the night before your surgery  You may drink clear liquids until 4:30 AM the morning of your surgery.   Clear liquids allowed are: Water, Non-Citrus Juices (without pulp), Carbonated Beverages, Clear Tea, Black Coffee Only, and Gatorade    Take these medicines the morning of surgery with A SIP OF WATER   Amlodipine (Norvasc)  Eye drops - if needed  Gabapentin (Neurontin)  Metoprolol  Follow your surgeon's instructions on when to stop Aspirin.  If no instructions were given by your surgeon then you will need to call the office to get those instructions.    7 days prior to surgery STOP taking any Aspirin (unless otherwise instructed by your surgeon), Aleve, Naproxen, Ibuprofen, Motrin, Advil, Goody's, BC's, all herbal medications, fish oil, and all vitamins.   WHAT DO I DO ABOUT MY DIABETES MEDICATION?   Marland Kitchen Do not take oral diabetes medicines (pills) the morning of surgery. - Metformin    How to Manage Your Diabetes Before and After Surgery  Why is it important to control my blood sugar before and after surgery? . Improving blood sugar levels before and after surgery helps healing and can limit problems. . A way of improving blood sugar control is eating a healthy diet by: o  Eating less sugar and carbohydrates o  Increasing activity/exercise o  Talking with your doctor about reaching your blood sugar goals . High blood sugars  (greater than 180 mg/dL) can raise your risk of infections and slow your recovery, so you will need to focus on controlling your diabetes during the weeks before surgery. . Make sure that the doctor who takes care of your diabetes knows about your planned surgery including the date and location.  How do I manage my blood sugar before surgery? . Check your blood sugar at least 4 times a day, starting 2 days before surgery, to make sure that the level is not too high or low. o Check your blood sugar the morning of your surgery when you wake up and every 2 hours until you get to the Short Stay unit. . If your blood sugar is less than 70 mg/dL, you will need to treat for low blood sugar: o Do not take insulin. o Treat a low blood sugar (less than 70 mg/dL) with  cup of clear juice (cranberry or apple), 4 glucose tablets, OR glucose gel. o Recheck blood sugar in 15 minutes after treatment (to make sure it is greater than 70 mg/dL). If your blood sugar is not greater than 70 mg/dL on recheck, call [phone] for further instructions. . Report your blood sugar to the short stay nurse when you get to Short Stay.  . If you are admitted to the hospital after surgery: o Your blood sugar will be checked by the staff and you will probably be given insulin after surgery (instead of oral diabetes medicines) to make sure you  have good blood sugar levels. o The goal for blood sugar control after surgery is 80-180 mg/dL.    The Morning of Surgery  Do not wear jewelry, make-up or nail polish.  Do not wear lotions, powders, or perfumes, or deodorant  Do not shave 48 hours prior to surgery.   Do not bring valuables to the hospital.  [facility] is not responsible for any belongings or valuables.  If you are a smoker, DO NOT Smoke 24 hours prior to surgery IF you wear a CPAP at night please bring your mask, tubing, and machine the morning of surgery   Remember that you must have someone to transport you  home after your surgery, and remain with you for 24 hours if you are discharged the same day.   Contacts, glasses, hearing aids, dentures or bridgework may not be worn into surgery.    Leave your suitcase in the car.  After surgery it may be brought to your room.  For patients admitted to the hospital, discharge time will be determined by your treatment team.  Patients discharged the day of surgery will not be allowed to drive home.    Special instructions:   [place]- Preparing For Surgery  Before surgery, you can play an important role. Because skin is not sterile, your skin needs to be as free of germs as possible. You can reduce the number of germs on your skin by washing with CHG (chlorahexidine gluconate) Soap before surgery.  CHG is an antiseptic cleaner which kills germs and bonds with the skin to continue killing germs even after washing.    Oral Hygiene is also important to reduce your risk of infection.  Remember - BRUSH YOUR TEETH THE MORNING OF SURGERY WITH YOUR REGULAR TOOTHPASTE  Please do not use if you have an allergy to CHG or antibacterial soaps. If your skin becomes reddened/irritated stop using the CHG.  Do not shave (including legs and underarms) for at least 48 hours prior to first CHG shower. It is OK to shave your face.  Please follow these instructions carefully.   1. Shower the NIGHT BEFORE SURGERY and the MORNING OF SURGERY with CHG Soap.   2. If you chose to wash your hair, wash your hair first as usual with your normal shampoo.  3. After you shampoo, rinse your hair and body thoroughly to remove the shampoo.  4. Use CHG as you would any other liquid soap. You can apply CHG directly to the skin and wash gently with a scrungie or a clean washcloth.   5. Apply the CHG Soap to your body ONLY FROM THE NECK DOWN.  Do not use on open wounds or open sores. Avoid contact with your eyes, ears, mouth and genitals (private parts). Wash Face and genitals (private  parts)  with your normal soap.   6. Wash thoroughly, paying special attention to the area where your surgery will be performed.  7. Thoroughly rinse your body with warm water from the neck down.  8. DO NOT shower/wash with your normal soap after using and rinsing off the CHG Soap.  9. Pat yourself dry with a CLEAN TOWEL.  10. Wear CLEAN PAJAMAS to bed the night before surgery, wear comfortable clothes the morning of surgery  11. Place CLEAN SHEETS on your bed the night of your first shower and DO NOT SLEEP WITH PETS.    Day of Surgery:  Do not apply any deodorants/lotions. Please shower the morning of surgery with the  CHG soap  Please wear clean clothes to the hospital/surgery center.   Remember to brush your teeth WITH YOUR REGULAR TOOTHPASTE.   Please read over the following fact sheets that you were given.

## 2019-05-25 ENCOUNTER — Other Ambulatory Visit: Payer: Self-pay

## 2019-05-25 ENCOUNTER — Encounter (HOSPITAL_COMMUNITY)
Admission: RE | Admit: 2019-05-25 | Discharge: 2019-05-25 | Disposition: A | Discharge status: Home / Self Care | Payer: Medicare Other | Source: Ambulatory Visit | Attending: General Surgery | Admitting: General Surgery

## 2019-05-25 ENCOUNTER — Encounter (HOSPITAL_COMMUNITY): Payer: Self-pay

## 2019-05-25 DIAGNOSIS — Z01812 Encounter for preprocedural laboratory examination: Secondary | ICD-10-CM | POA: Diagnosis present

## 2019-05-25 DIAGNOSIS — I10 Essential (primary) hypertension: Secondary | ICD-10-CM | POA: Insufficient documentation

## 2019-05-25 DIAGNOSIS — E119 Type 2 diabetes mellitus without complications: Secondary | ICD-10-CM | POA: Insufficient documentation

## 2019-05-25 DIAGNOSIS — H409 Unspecified glaucoma: Secondary | ICD-10-CM | POA: Insufficient documentation

## 2019-05-25 DIAGNOSIS — C50911 Malignant neoplasm of unspecified site of right female breast: Secondary | ICD-10-CM | POA: Diagnosis not present

## 2019-05-25 DIAGNOSIS — Z800 Family history of malignant neoplasm of digestive organs: Secondary | ICD-10-CM | POA: Insufficient documentation

## 2019-05-25 DIAGNOSIS — Z7984 Long term (current) use of oral hypoglycemic drugs: Secondary | ICD-10-CM | POA: Insufficient documentation

## 2019-05-25 DIAGNOSIS — Z20828 Contact with and (suspected) exposure to other viral communicable diseases: Secondary | ICD-10-CM | POA: Insufficient documentation

## 2019-05-25 DIAGNOSIS — Z7982 Long term (current) use of aspirin: Secondary | ICD-10-CM | POA: Insufficient documentation

## 2019-05-25 DIAGNOSIS — Z803 Family history of malignant neoplasm of breast: Secondary | ICD-10-CM | POA: Diagnosis not present

## 2019-05-25 DIAGNOSIS — E785 Hyperlipidemia, unspecified: Secondary | ICD-10-CM | POA: Diagnosis not present

## 2019-05-25 DIAGNOSIS — Z79899 Other long term (current) drug therapy: Secondary | ICD-10-CM | POA: Insufficient documentation

## 2019-05-25 LAB — BASIC METABOLIC PANEL
Anion gap: 12 (ref 5–15)
BUN: 9 mg/dL (ref 8–23)
CO2: 25 mmol/L (ref 22–32)
Calcium: 9.6 mg/dL (ref 8.9–10.3)
Chloride: 103 mmol/L (ref 98–111)
Creatinine, Ser: 0.57 mg/dL (ref 0.44–1.00)
GFR calc Af Amer: >60 mL/min (ref >60)
GFR calc non Af Amer: >60 mL/min (ref >60)
Glucose, Bld: 138 mg/dL — ABNORMAL HIGH (ref 70–99)
Potassium: 3.3 mmol/L — ABNORMAL LOW (ref 3.5–5.1)
Sodium: 140 mmol/L (ref 135–145)

## 2019-05-25 LAB — CBC
HCT: 43.3 % (ref 36.0–46.0)
Hemoglobin: 13.5 g/dL (ref 12.0–15.0)
MCH: 26.2 pg (ref 26.0–34.0)
MCHC: 31.2 g/dL (ref 30.0–36.0)
MCV: 84.1 fL (ref 80.0–100.0)
Platelets: 309 10*3/uL (ref 150–400)
RBC: 5.15 MIL/uL — ABNORMAL HIGH (ref 3.87–5.11)
RDW: 13.3 % (ref 11.5–15.5)
WBC: 6.3 10*3/uL (ref 4.0–10.5)
nRBC: 0 % (ref 0.0–0.2)

## 2019-05-25 LAB — GLUCOSE, CAPILLARY: Glucose-Capillary: 128 mg/dL — ABNORMAL HIGH (ref 70–99)

## 2019-05-25 NOTE — Progress Notes (Signed)
PCP - [name] Per pt, Dr. Criss Rosales monitors her diabetes Cardiologist - With last EKG, pt stated she was sent to Dr. Gwenlyn Found for clearance for surgery  Chest x-ray - n/a EKG - 04-21-19 ECHO - 04-06-19   DM - Type 2 Fasting Blood Sugar - 100-150 Pt stated that since her last A1c of 11.8 (04-10-19) she has been checking her sugars and eating better. [place] notified. Per Jeneen Rinks, do not need to recheck A1c at this time. CBG at PAT - 128  Aspirin Instructions: Pt instructed to call Dr. Darrel Hoover office for instructions on when to stop ASA. Pt stated understanding.   ERAS Protcol - yes, no drink ordered  COVID TEST- Tuesday, 05-26-19   Anesthesia review: yes, EKG & A1c  Patient denies shortness of breath, fever, cough and chest pain at PAT appointment   Patient verbalized understanding of instructions that were given to them at the PAT appointment. Patient was also instructed that they will need to review over the PAT instructions again at home before surgery.

## 2019-05-26 ENCOUNTER — Other Ambulatory Visit (HOSPITAL_COMMUNITY)
Admission: RE | Admit: 2019-05-26 | Discharge: 2019-05-26 | Disposition: A | Discharge status: Home / Self Care | Payer: Medicare Other | Source: Ambulatory Visit | Attending: General Surgery | Admitting: General Surgery

## 2019-05-26 DIAGNOSIS — Z20828 Contact with and (suspected) exposure to other viral communicable diseases: Secondary | ICD-10-CM | POA: Diagnosis not present

## 2019-05-26 DIAGNOSIS — Z01812 Encounter for preprocedural laboratory examination: Secondary | ICD-10-CM | POA: Insufficient documentation

## 2019-05-26 LAB — SARS CORONAVIRUS 2 (TAT 6-24 HRS): SARS Coronavirus 2: NEGATIVE

## 2019-05-26 NOTE — Progress Notes (Signed)
Anesthesia Chart Review:  Case: [id number] Date/Time: 05/29/19 0715   Procedures:      RIGHT BREAST LUMPECTOMY WITH RADIOACTIVE SEED AND RIGHT DEEP AXILLARY SENTINEL LYMPH NODE BIOPSY INJECT BLUE DYE (Right Breast)     INSERTION PORT-A-CATH WITH ULTRASOUND (N/A Breast)   Anesthesia type: General   Pre-op diagnosis: RIGHT BREAST CANCER   Location: [facility] OR ROOM 02 / [place]   Surgeon: Fanny Skates, MD    RSL implant date is not listed on posting.    DISCUSSION: Patient is a 63 year old female scheduled for the above procedure. Surgery was initially scheduled for 04/14/19, but was postponed due to need for primary care follow-up for DM (A1c 11.8% 04/10/19) and cardiology evaluation (abnormal EKG). Patient has since seen cardiologist Dr. Gwenlyn Found and cleared at "low risk" without additional testing (see below). She was also seen by PCP Dr. Criss Rosales and declined Trulicity but has become more compliant with her diet and home CBG monitoring. She is also on metformin. She reports fasting CBGs ~ 100-150 now.   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    Her A1c was 11.8% on 04/10/19 as discussed above. A1c was not repeated at PAT as it was done < 2 months ago. She, however, reported home fasting CBG results of < 150. CBG at PAT was 128 and glucose on BMET was 138. She will get a CBG at time of RSL and on the day of surgery.   05/26/19 COVID-19 test is in process.  If negative, and otherwise no acute changes then I would anticipate that she can proceed as planned.   VS: BP 131/81   Pulse 64   Temp 36.8 C   Resp 20   Ht '5\' 2"'$  (1.575 m)   Wt 67.1 kg   SpO2 96%   BMI 27.07 kg/m    PROVIDERS: [name], DO is PCP ([place]). Last visit 04/17/19 for DM follow-up. Patient declined starting Trulicity. Reported dietary indiscretion  (stress eating), so she opted for strict ADA diet and ongoing metformin for management of her DM. - [name], Sarajane Jews, MD is HEM-ONC. Last visit 03/30/19. Plan for surgery, genetic testing, adjuvant chemoradiation.  - Quay Burow, MD is cardiologist. New patient evaluation on 04/21/19 for preoperative evaluation due to abnormal EKG with CAD risk factors. Pre-chemo echo unremarkable and patient asymptomatic, so he cleared her at "low risk without the need for functional study."    LABS: Labs reviewed: Acceptable for surgery. (all labs ordered are listed, but only abnormal results are displayed)  Labs Reviewed  GLUCOSE, CAPILLARY - Abnormal; Notable for the following components:      Result Value   Glucose-Capillary 128 (*)    All other components within normal limits  CBC - Abnormal; Notable for the following components:   RBC 5.15 (*)    All other components within normal limits  BASIC METABOLIC PANEL - Abnormal; Notable for the following components:   Potassium 3.3 (*)    Glucose, Bld 138 (*)    All other components within normal limits     EKG:  EKG 04/21/19:  Normal sinus rhythm Possible left atrial enlargement Nonspecific T wave abnormality Abnormal ECG  EKG 04/10/19: Normal sinus rhythm Possible Left atrial enlargement ST & T wave abnormality, consider inferior ischemia ST & T wave abnormality, consider anterolateral ischemia Abnormal ECG   CV:  Echo 04/06/19: IMPRESSIONS 1. The left ventricle has hyperdynamic systolic function, with an ejection fraction of >65%. The cavity size was normal. There is moderately increased left ventricular wall thickness. Left ventricular diastolic Doppler parameters are consistent with  impaired relaxation. Elevated left ventricular end-diastolic pressure The E/e' is >15. No evidence of left ventricular regional wall motion abnormalities. 2. The right ventricle has normal systolic function. The cavity was normal. There is no increase in  right ventricular wall thickness. 3. The mitral valve is grossly normal. 4. The tricuspid valve is grossly normal. 5. The aortic valve is tricuspid. No stenosis of the aortic valve. 6. The aorta is normal unless otherwise noted.    Past Medical History:  Diagnosis Date  . Anemia    during pregnancy  . Cancer [facility]) 2020   breast cancer on right  . Diabetes mellitus    Type 2  . Family history of breast cancer   . Family history of throat cancer   . Glaucoma   . Hx of colonic polyps   . Hyperlipidemia   . Hypertension   . Legally blind    completely blind in left eye, low vision in right    Past Surgical History:  Procedure Laterality Date  . COLONOSCOPY  01/22/2012   Procedure: COLONOSCOPY;  Surgeon: [name], MD;  Location: [facility] END[facility]COPY;  Service: Endoscopy;  Laterality: N/A;  . COLONOSCOPY    . COLONOSCOPY    . HERNIA REPAIR    . TUBAL LIGATION      MEDICATIONS: . amLODipine (NORVASC) 10 MG tablet  . aspirin 81 MG EC tablet  . bimatoprost (LUMIGAN) 0.01 % SOLN  . blood glucose meter kit and supplies KIT  . dorzolamide (TRUSOPT) 2 % ophthalmic solution  . gabapentin (NEURONTIN) 100 MG capsule  . lisinopril-hydrochlorothiazide (ZESTORETIC) 20-25 MG tablet  . lovastatin (MEVACOR) 20 MG tablet  . metFORMIN (GLUCOPHAGE) 1000 MG tablet  . metoprolol succinate (TOPROL-XL) 50 MG 24 hr tablet   No current facility-administered medications for this encounter.   Patient instructed to follow-up with Dr. Dalbert Batman regarding perioperative ASA instructions.   Myra Gianotti, PA-C Surgical Short Stay/Anesthesiology [facility] Phone [phone] [facility] Phone [phone] 05/26/2019 2:38 PM

## 2019-05-26 NOTE — Anesthesia Preprocedure Evaluation (Addendum)
Anesthesia Evaluation  Patient identified by MRN, date of birth, ID band Patient awake    Reviewed: Allergy & Precautions, NPO status , Patient's Chart, lab work & pertinent test results  Airway Mallampati: II  TM Distance: >3 FB Neck ROM: Full    Dental no notable dental hx. (+) Teeth Intact, Dental Advisory Given,    Pulmonary neg pulmonary ROS, former smoker,    Pulmonary exam normal breath sounds clear to auscultation       Cardiovascular hypertension, negative cardio ROS Normal cardiovascular exam Rhythm:Regular Rate:Normal  04/06/19 Echo The left ventricle has hyperdynamic systolic function, with an ejection fraction of >65%.    Neuro/Psych negative neurological ROS  negative psych ROS   GI/Hepatic negative GI ROS, Neg liver ROS,   Endo/Other  diabetes, Poorly Controlled, Type 2  Renal/GU K+ 3.3 Cr 0.57     Musculoskeletal  (+) Arthritis ,   Abdominal   Peds  Hematology Hgb 13.5 Plt 305   Anesthesia Other Findings R Breast CA All to sulfa  Reproductive/Obstetrics                           Anesthesia Physical Anesthesia Plan  ASA: III  Anesthesia Plan: General   Post-op Pain Management:    Induction: Intravenous  PONV Risk Score and Plan: Treatment may vary due to age or medical condition, Ondansetron, Dexamethasone, Midazolam and Scopolamine patch - Pre-op  Airway Management Planned: LMA  Additional Equipment: None  Intra-op Plan:   Post-operative Plan:   Informed Consent: I have reviewed the patients History and Physical, chart, labs and discussed the procedure including the risks, benefits and alternatives for the proposed anesthesia with the patient or authorized representative who has indicated his/her understanding and acceptance.     Dental advisory given  Plan Discussed with: CRNA  Anesthesia Plan Comments: (PAT note written 05/26/2019 by Myra Gianotti,  PA-C.  GA w R Pec block )      Anesthesia Quick Evaluation

## 2019-05-28 ENCOUNTER — Other Ambulatory Visit: Payer: Self-pay

## 2019-05-28 ENCOUNTER — Ambulatory Visit
Admission: RE | Admit: 2019-05-28 | Discharge: 2019-05-28 | Disposition: A | Discharge status: Home / Self Care | Payer: Medicare Other | Source: Ambulatory Visit | Attending: General Surgery | Admitting: General Surgery

## 2019-05-28 DIAGNOSIS — C50411 Malignant neoplasm of upper-outer quadrant of right female breast: Secondary | ICD-10-CM

## 2019-05-29 ENCOUNTER — Encounter (HOSPITAL_COMMUNITY): Payer: Self-pay | Admitting: Surgery

## 2019-05-29 ENCOUNTER — Encounter (HOSPITAL_COMMUNITY)
Admission: RE | Disposition: A | Discharge status: Home / Self Care | Payer: Self-pay | Source: Home / Self Care | Attending: General Surgery

## 2019-05-29 ENCOUNTER — Ambulatory Visit (HOSPITAL_COMMUNITY): Payer: Medicare Other | Admitting: Anesthesiology

## 2019-05-29 ENCOUNTER — Ambulatory Visit (HOSPITAL_COMMUNITY)
Admission: RE | Admit: 2019-05-29 | Discharge: 2019-05-29 | Disposition: A | Discharge status: Home / Self Care | Payer: Medicare Other | Attending: General Surgery | Admitting: General Surgery

## 2019-05-29 ENCOUNTER — Ambulatory Visit (HOSPITAL_COMMUNITY): Payer: Medicare Other | Admitting: Physician Assistant

## 2019-05-29 ENCOUNTER — Ambulatory Visit (HOSPITAL_COMMUNITY): Payer: Medicare Other

## 2019-05-29 ENCOUNTER — Ambulatory Visit
Admission: RE | Admit: 2019-05-29 | Discharge: 2019-05-29 | Disposition: A | Discharge status: Home / Self Care | Payer: Medicare Other | Source: Ambulatory Visit | Attending: General Surgery | Admitting: General Surgery

## 2019-05-29 ENCOUNTER — Ambulatory Visit (HOSPITAL_COMMUNITY)
Admission: RE | Admit: 2019-05-29 | Discharge: 2019-05-29 | Disposition: A | Discharge status: Home / Self Care | Payer: Medicare Other | Source: Ambulatory Visit | Attending: General Surgery | Admitting: General Surgery

## 2019-05-29 DIAGNOSIS — Z803 Family history of malignant neoplasm of breast: Secondary | ICD-10-CM | POA: Diagnosis not present

## 2019-05-29 DIAGNOSIS — E119 Type 2 diabetes mellitus without complications: Secondary | ICD-10-CM | POA: Diagnosis not present

## 2019-05-29 DIAGNOSIS — Z820 Family history of epilepsy and other diseases of the nervous system: Secondary | ICD-10-CM | POA: Insufficient documentation

## 2019-05-29 DIAGNOSIS — Z419 Encounter for procedure for purposes other than remedying health state, unspecified: Secondary | ICD-10-CM

## 2019-05-29 DIAGNOSIS — Z87891 Personal history of nicotine dependence: Secondary | ICD-10-CM | POA: Diagnosis not present

## 2019-05-29 DIAGNOSIS — D0511 Intraductal carcinoma in situ of right breast: Secondary | ICD-10-CM | POA: Insufficient documentation

## 2019-05-29 DIAGNOSIS — Z811 Family history of alcohol abuse and dependence: Secondary | ICD-10-CM | POA: Diagnosis not present

## 2019-05-29 DIAGNOSIS — Z79899 Other long term (current) drug therapy: Secondary | ICD-10-CM | POA: Insufficient documentation

## 2019-05-29 DIAGNOSIS — H548 Legal blindness, as defined in USA: Secondary | ICD-10-CM | POA: Insufficient documentation

## 2019-05-29 DIAGNOSIS — Z7982 Long term (current) use of aspirin: Secondary | ICD-10-CM | POA: Diagnosis not present

## 2019-05-29 DIAGNOSIS — M1990 Unspecified osteoarthritis, unspecified site: Secondary | ICD-10-CM | POA: Diagnosis not present

## 2019-05-29 DIAGNOSIS — Z171 Estrogen receptor negative status [ER-]: Secondary | ICD-10-CM

## 2019-05-29 DIAGNOSIS — C50411 Malignant neoplasm of upper-outer quadrant of right female breast: Secondary | ICD-10-CM

## 2019-05-29 DIAGNOSIS — Z8249 Family history of ischemic heart disease and other diseases of the circulatory system: Secondary | ICD-10-CM | POA: Insufficient documentation

## 2019-05-29 DIAGNOSIS — I10 Essential (primary) hypertension: Secondary | ICD-10-CM | POA: Diagnosis not present

## 2019-05-29 DIAGNOSIS — E1165 Type 2 diabetes mellitus with hyperglycemia: Secondary | ICD-10-CM

## 2019-05-29 DIAGNOSIS — Z95828 Presence of other vascular implants and grafts: Secondary | ICD-10-CM

## 2019-05-29 DIAGNOSIS — Z833 Family history of diabetes mellitus: Secondary | ICD-10-CM | POA: Diagnosis not present

## 2019-05-29 DIAGNOSIS — Z7984 Long term (current) use of oral hypoglycemic drugs: Secondary | ICD-10-CM | POA: Diagnosis not present

## 2019-05-29 DIAGNOSIS — Z808 Family history of malignant neoplasm of other organs or systems: Secondary | ICD-10-CM | POA: Diagnosis not present

## 2019-05-29 HISTORY — PX: PORTACATH PLACEMENT: SHX2246

## 2019-05-29 HISTORY — PX: BREAST LUMPECTOMY WITH RADIOACTIVE SEED AND SENTINEL LYMPH NODE BIOPSY: SHX6550

## 2019-05-29 LAB — GLUCOSE, CAPILLARY
Glucose-Capillary: 131 mg/dL — ABNORMAL HIGH (ref 70–99)
Glucose-Capillary: 192 mg/dL — ABNORMAL HIGH (ref 70–99)

## 2019-05-29 SURGERY — BREAST LUMPECTOMY WITH RADIOACTIVE SEED AND SENTINEL LYMPH NODE BIOPSY
Anesthesia: General | Site: Breast | Laterality: Right

## 2019-05-29 MED ORDER — LIDOCAINE-EPINEPHRINE (PF) 1 %-1:200000 IJ SOLN
INTRAMUSCULAR | Status: DC | PRN
  Administered 2019-05-29: 10 mL

## 2019-05-29 MED ORDER — ONDANSETRON HCL 4 MG/2ML IJ SOLN: 4.0000 mg | Freq: Once | INTRAMUSCULAR | Status: DC | PRN

## 2019-05-29 MED ORDER — HYDROMORPHONE HCL 1 MG/ML IJ SOLN: 0.2500 mg | INTRAMUSCULAR | Status: DC | PRN

## 2019-05-29 MED ORDER — TECHNETIUM TC 99M SULFUR COLLOID FILTERED
1.0000 | Freq: Once | INTRAVENOUS | Status: AC | PRN
  Administered 2019-05-29: 07:00:00 1 via INTRADERMAL

## 2019-05-29 MED ORDER — HYDROCODONE-ACETAMINOPHEN 5-325 MG PO TABS: 1.0000 | ORAL_TABLET | Freq: Four times a day (QID) | ORAL | 0 refills | Status: DC | PRN

## 2019-05-29 MED ORDER — CHLORHEXIDINE GLUCONATE CLOTH 2 % EX PADS: 6.0000 | MEDICATED_PAD | Freq: Once | CUTANEOUS | Status: DC

## 2019-05-29 MED ORDER — 0.9 % SODIUM CHLORIDE (POUR BTL) OPTIME
TOPICAL | Status: DC | PRN
  Administered 2019-05-29: 08:00:00 1000 mL

## 2019-05-29 MED ORDER — OXYCODONE HCL 5 MG PO TABS: 5.0000 mg | ORAL_TABLET | ORAL | Status: DC | PRN

## 2019-05-29 MED ORDER — GABAPENTIN 300 MG PO CAPS
300.0000 mg | ORAL_CAPSULE | ORAL | Status: AC
  Administered 2019-05-29: 07:00:00 300 mg via ORAL
  Filled 2019-05-29: qty 1

## 2019-05-29 MED ORDER — SODIUM CHLORIDE (PF) 0.9 % IJ SOLN
INTRAVENOUS | Status: DC | PRN
  Administered 2019-05-29: 08:00:00 5 mL

## 2019-05-29 MED ORDER — CELECOXIB 200 MG PO CAPS
200.0000 mg | ORAL_CAPSULE | ORAL | Status: AC
  Administered 2019-05-29: 200 mg via ORAL
  Filled 2019-05-29: qty 1

## 2019-05-29 MED ORDER — PHENYLEPHRINE 40 MCG/ML (10ML) SYRINGE FOR IV PUSH (FOR BLOOD PRESSURE SUPPORT)
PREFILLED_SYRINGE | INTRAVENOUS | Status: AC
  Filled 2019-05-29: qty 10

## 2019-05-29 MED ORDER — PROPOFOL 10 MG/ML IV BOLUS
INTRAVENOUS | Status: AC
  Filled 2019-05-29: qty 20

## 2019-05-29 MED ORDER — FENTANYL CITRATE (PF) 250 MCG/5ML IJ SOLN
INTRAMUSCULAR | Status: AC
  Filled 2019-05-29: qty 5

## 2019-05-29 MED ORDER — BUPIVACAINE-EPINEPHRINE 0.5% -1:200000 IJ SOLN
INTRAMUSCULAR | Status: AC
  Filled 2019-05-29: qty 1

## 2019-05-29 MED ORDER — OXYCODONE HCL 5 MG PO TABS: 5.0000 mg | ORAL_TABLET | Freq: Once | ORAL | Status: DC | PRN

## 2019-05-29 MED ORDER — SODIUM CHLORIDE 0.9 % IV SOLN
INTRAVENOUS | Status: DC | PRN
  Administered 2019-05-29: 25 ug/min via INTRAVENOUS

## 2019-05-29 MED ORDER — FENTANYL CITRATE (PF) 250 MCG/5ML IJ SOLN
INTRAMUSCULAR | Status: DC | PRN
  Administered 2019-05-29: 25 ug via INTRAVENOUS
  Administered 2019-05-29 (×2): 50 ug via INTRAVENOUS
  Administered 2019-05-29: 25 ug via INTRAVENOUS

## 2019-05-29 MED ORDER — DEXAMETHASONE SODIUM PHOSPHATE 10 MG/ML IJ SOLN
INTRAMUSCULAR | Status: DC | PRN
  Administered 2019-05-29: 4 mg via INTRAVENOUS

## 2019-05-29 MED ORDER — SODIUM CHLORIDE 0.9 % IV SOLN
INTRAVENOUS | Status: DC | PRN
  Administered 2019-05-29: 500 mL

## 2019-05-29 MED ORDER — MIDAZOLAM HCL 2 MG/2ML IJ SOLN
INTRAMUSCULAR | Status: AC
  Filled 2019-05-29: qty 2

## 2019-05-29 MED ORDER — LIDOCAINE 2% (20 MG/ML) 5 ML SYRINGE
INTRAMUSCULAR | Status: DC | PRN
  Administered 2019-05-29: 100 mg via INTRAVENOUS

## 2019-05-29 MED ORDER — LIDOCAINE-EPINEPHRINE 1 %-1:100000 IJ SOLN
INTRAMUSCULAR | Status: AC
  Filled 2019-05-29: qty 1

## 2019-05-29 MED ORDER — ACETAMINOPHEN 325 MG PO TABS: 650.0000 mg | ORAL_TABLET | ORAL | Status: DC | PRN

## 2019-05-29 MED ORDER — METHYLENE BLUE 0.5 % INJ SOLN
INTRAVENOUS | Status: AC
  Filled 2019-05-29: qty 10

## 2019-05-29 MED ORDER — CEFAZOLIN SODIUM-DEXTROSE 2-4 GM/100ML-% IV SOLN
2.0000 g | INTRAVENOUS | Status: AC
  Administered 2019-05-29: 2 g via INTRAVENOUS
  Filled 2019-05-29: qty 100

## 2019-05-29 MED ORDER — ONDANSETRON HCL 4 MG/2ML IJ SOLN
INTRAMUSCULAR | Status: AC
  Filled 2019-05-29: qty 2

## 2019-05-29 MED ORDER — LIDOCAINE 2% (20 MG/ML) 5 ML SYRINGE
INTRAMUSCULAR | Status: AC
  Filled 2019-05-29: qty 5

## 2019-05-29 MED ORDER — BUPIVACAINE-EPINEPHRINE (PF) 0.5% -1:200000 IJ SOLN
INTRAMUSCULAR | Status: DC | PRN
  Administered 2019-05-29: 10 mL

## 2019-05-29 MED ORDER — CLONIDINE HCL (ANALGESIA) 100 MCG/ML EP SOLN
EPIDURAL | Status: DC | PRN
  Administered 2019-05-29: 100 ug

## 2019-05-29 MED ORDER — SODIUM CHLORIDE 0.9 % IV SOLN: 250.0000 mL | INTRAVENOUS | Status: DC | PRN

## 2019-05-29 MED ORDER — HEPARIN SOD (PORK) LOCK FLUSH 100 UNIT/ML IV SOLN
INTRAVENOUS | Status: DC | PRN
  Administered 2019-05-29: 500 [IU]

## 2019-05-29 MED ORDER — SODIUM CHLORIDE 0.9% FLUSH: 3.0000 mL | INTRAVENOUS | Status: DC | PRN

## 2019-05-29 MED ORDER — SODIUM CHLORIDE 0.9 % IV SOLN
INTRAVENOUS | Status: AC
  Filled 2019-05-29: qty 1.2

## 2019-05-29 MED ORDER — PHENYLEPHRINE HCL (PRESSORS) 10 MG/ML IV SOLN
INTRAVENOUS | Status: AC
  Filled 2019-05-29: qty 1

## 2019-05-29 MED ORDER — MIDAZOLAM HCL 2 MG/2ML IJ SOLN
INTRAMUSCULAR | Status: DC | PRN
  Administered 2019-05-29 (×2): 1 mg via INTRAVENOUS

## 2019-05-29 MED ORDER — OXYCODONE HCL 5 MG/5ML PO SOLN: 5.0000 mg | Freq: Once | ORAL | Status: DC | PRN

## 2019-05-29 MED ORDER — HEPARIN SOD (PORK) LOCK FLUSH 100 UNIT/ML IV SOLN
INTRAVENOUS | Status: AC
  Filled 2019-05-29: qty 5

## 2019-05-29 MED ORDER — DEXAMETHASONE SODIUM PHOSPHATE 10 MG/ML IJ SOLN
INTRAMUSCULAR | Status: AC
  Filled 2019-05-29: qty 1

## 2019-05-29 MED ORDER — ACETAMINOPHEN 500 MG PO TABS
1000.0000 mg | ORAL_TABLET | ORAL | Status: AC
  Administered 2019-05-29: 1000 mg via ORAL
  Filled 2019-05-29: qty 2

## 2019-05-29 MED ORDER — LACTATED RINGERS IV SOLN: INTRAVENOUS | Status: DC

## 2019-05-29 MED ORDER — ACETAMINOPHEN 650 MG RE SUPP: 650.0000 mg | RECTAL | Status: DC | PRN

## 2019-05-29 MED ORDER — PROPOFOL 10 MG/ML IV BOLUS
INTRAVENOUS | Status: DC | PRN
  Administered 2019-05-29: 150 mg via INTRAVENOUS

## 2019-05-29 MED ORDER — SODIUM CHLORIDE 0.9% FLUSH: 3.0000 mL | Freq: Two times a day (BID) | INTRAVENOUS | Status: DC

## 2019-05-29 MED ORDER — FENTANYL CITRATE (PF) 100 MCG/2ML IJ SOLN: 25.0000 ug | INTRAMUSCULAR | Status: DC | PRN

## 2019-05-29 MED ORDER — ROPIVACAINE HCL 5 MG/ML IJ SOLN
INTRAMUSCULAR | Status: DC | PRN
  Administered 2019-05-29: 30 mL

## 2019-05-29 MED ORDER — LACTATED RINGERS IV SOLN
INTRAVENOUS | Status: DC | PRN
  Administered 2019-05-29: 07:00:00 via INTRAVENOUS

## 2019-05-29 MED ORDER — ONDANSETRON HCL 4 MG/2ML IJ SOLN
INTRAMUSCULAR | Status: DC | PRN
  Administered 2019-05-29: 4 mg via INTRAVENOUS

## 2019-05-29 MED ORDER — KETOROLAC TROMETHAMINE 30 MG/ML IJ SOLN: 30.0000 mg | Freq: Once | INTRAMUSCULAR | Status: DC | PRN

## 2019-05-29 MED ORDER — IOPAMIDOL (ISOVUE-300) INJECTION 61%: INTRAVENOUS | Status: DC | PRN

## 2019-05-29 SURGICAL SUPPLY — 68 items
ADH SKN CLS APL DERMABOND .7 (GAUZE/BANDAGES/DRESSINGS) ×4
APL PRP STRL LF DISP 70% ISPRP (MISCELLANEOUS) ×2
APPLIER CLIP 9.375 MED OPEN (MISCELLANEOUS) ×4
APR CLP MED 9.3 20 MLT OPN (MISCELLANEOUS) ×2
BAG DECANTER FOR FLEXI CONT (MISCELLANEOUS) ×4 IMPLANT
BINDER BREAST LRG (GAUZE/BANDAGES/DRESSINGS) IMPLANT
BINDER BREAST XLRG (GAUZE/BANDAGES/DRESSINGS) ×4 IMPLANT
BLADE CLIPPER SURG (BLADE) IMPLANT
CANISTER SUCT 3000ML PPV (MISCELLANEOUS) ×4 IMPLANT
CHLORAPREP W/TINT 26 (MISCELLANEOUS) ×4 IMPLANT
CLIP APPLIE 9.375 MED OPEN (MISCELLANEOUS) ×2 IMPLANT
CONT SPEC 4OZ CLIKSEAL STRL BL (MISCELLANEOUS) ×4 IMPLANT
COVER PROBE W GEL 5X96 (DRAPES) ×4 IMPLANT
COVER SURGICAL LIGHT HANDLE (MISCELLANEOUS) ×8 IMPLANT
COVER TRANSDUCER ULTRASND GEL (DRAPE) IMPLANT
COVER WAND RF STERILE (DRAPES) IMPLANT
DERMABOND ADVANCED (GAUZE/BANDAGES/DRESSINGS) ×4
DERMABOND ADVANCED .7 DNX12 (GAUZE/BANDAGES/DRESSINGS) ×4 IMPLANT
DEVICE DUBIN SPECIMEN MAMMOGRA (MISCELLANEOUS) ×4 IMPLANT
DRAPE C-ARM 42X72 X-RAY (DRAPES) ×4 IMPLANT
DRAPE CHEST BREAST 15X10 FENES (DRAPES) ×8 IMPLANT
DRAPE HALF SHEET 40X57 (DRAPES) ×4 IMPLANT
DRAPE LAPAROSCOPIC ABDOMINAL (DRAPES) ×4 IMPLANT
DRSG PAD ABDOMINAL 8X10 ST (GAUZE/BANDAGES/DRESSINGS) ×4 IMPLANT
ELECT CAUTERY BLADE 6.4 (BLADE) ×4 IMPLANT
ELECT REM PT RETURN 9FT ADLT (ELECTROSURGICAL) ×4
ELECTRODE REM PT RTRN 9FT ADLT (ELECTROSURGICAL) ×2 IMPLANT
FILTER STRAW FLUID ASPIR (MISCELLANEOUS) ×4 IMPLANT
GAUZE 4X4 16PLY RFD (DISPOSABLE) ×4 IMPLANT
GAUZE SPONGE 4X4 12PLY STRL (GAUZE/BANDAGES/DRESSINGS) ×4 IMPLANT
GLOVE SS BIOGEL STRL SZ 7 (GLOVE) ×2 IMPLANT
GLOVE SUPERSENSE BIOGEL SZ 7 (GLOVE) ×2
GOWN STRL REUS W/ TWL LRG LVL3 (GOWN DISPOSABLE) ×6 IMPLANT
GOWN STRL REUS W/ TWL XL LVL3 (GOWN DISPOSABLE) ×4 IMPLANT
GOWN STRL REUS W/TWL LRG LVL3 (GOWN DISPOSABLE) ×9
GOWN STRL REUS W/TWL XL LVL3 (GOWN DISPOSABLE) ×6
INTRODUCER 13FR (INTRODUCER) IMPLANT
INTRODUCER COOK 11FR (CATHETERS) IMPLANT
KIT BASIN OR (CUSTOM PROCEDURE TRAY) ×4 IMPLANT
KIT MARKER MARGIN INK (KITS) ×4 IMPLANT
KIT PORT POWER 8FR ISP CVUE (Port) ×4 IMPLANT
KIT TURNOVER KIT B (KITS) ×4 IMPLANT
NEEDLE 18GX1X1/2 (RX/OR ONLY) (NEEDLE) IMPLANT
NEEDLE HYPO 25GX1X1/2 BEV (NEEDLE) ×4 IMPLANT
NS IRRIG 1000ML POUR BTL (IV SOLUTION) ×4 IMPLANT
PACK GENERAL/GYN (CUSTOM PROCEDURE TRAY) ×4 IMPLANT
PAD ABD 7.5X8 STRL (GAUZE/BANDAGES/DRESSINGS) ×4 IMPLANT
PAD ARMBOARD 7.5X6 YLW CONV (MISCELLANEOUS) ×4 IMPLANT
PENCIL SMOKE EVACUATOR (MISCELLANEOUS) ×4 IMPLANT
POSITIONER HEAD DONUT 9IN (MISCELLANEOUS) ×4 IMPLANT
SET INTRODUCER 12FR PACEMAKER (INTRODUCER) IMPLANT
SET SHEATH INTRODUCER 10FR (MISCELLANEOUS) IMPLANT
SHEATH COOK PEEL AWAY SET 9F (SHEATH) IMPLANT
SPONGE LAP 4X18 RFD (DISPOSABLE) ×4 IMPLANT
SURGILUBE 2OZ TUBE FLIPTOP (MISCELLANEOUS) IMPLANT
SUT MNCRL AB 4-0 PS2 18 (SUTURE) ×12 IMPLANT
SUT PROLENE 2 0 CT2 30 (SUTURE) ×4 IMPLANT
SUT SILK 2 0 SH (SUTURE) ×4 IMPLANT
SUT VIC AB 3-0 SH 18 (SUTURE) ×8 IMPLANT
SYR 10ML LL (SYRINGE) ×8 IMPLANT
SYR 5ML LUER SLIP (SYRINGE) ×4 IMPLANT
SYR CONTROL 10ML LL (SYRINGE) ×4 IMPLANT
TOWEL GREEN STERILE (TOWEL DISPOSABLE) ×4 IMPLANT
TOWEL GREEN STERILE FF (TOWEL DISPOSABLE) ×4 IMPLANT
TRAY LAPAROSCOPIC MC (CUSTOM PROCEDURE TRAY) ×4 IMPLANT
TUBE CONNECTING 12'X1/4 (SUCTIONS)
TUBE CONNECTING 12X1/4 (SUCTIONS) IMPLANT
YANKAUER SUCT BULB TIP NO VENT (SUCTIONS) IMPLANT

## 2019-05-29 NOTE — Op Note (Signed)
Patient Name:           Megan Khan   Date of Surgery:        05/29/2019  Pre op Diagnosis:      Invasive ductal carcinoma right breast, estrogen receptor negative, HER-2 positive  Post op Diagnosis:    Same  Procedure:                 Insertion PowerPort [place] 8 French tunneled venous vascular access device                                      Use of fluoroscopy for guidance and positioning                                      Inject blue dye right breast                                      Right breast lumpectomy with radioactive seed localization                                      Reexcision superior margin                                      Right axillary deep sentinel lymph node biopsy  Surgeon:                     Edsel Petrin. Dalbert Batman, M.D., FACS  Assistant:                      Or staff  Operative Indications:    This is a very pleasant 63 year old woman for who comes to the operating room for definitive surgery for her right breast cancer.. Dr. Jana Hakim involved in her care.Marland Kitchen PCP is Dr. [name]. Dr. Quay Burow from Cardiology has seen her and cleared her for breast surgery with low risk.      Recent mammogram show a 10 mm mass in the right breast, 11 o'clock position, 7 cm from the nipple. Axillary ultrasound negative. 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 She has seen Dr. Jana Hakim. She has been discussed in breast conference.  Chemotherapy is recommended and the patient agrees to that   She has declined genetic testing, I believe.,      Comorbidities include legal blindness. Non-insulin-dependent diabetes. Hypertension.  One sister was treated for breast cancer by Dr. Jana Hakim and Dr. Barry Dienes. A maternal cousin was also treated for breast cancer by me. Mother deceased with diabetes. Father deceased with dementia       We will proceed with scheduling for Port-A-Cath insertion with ultrasound, inject blue  dye right breast, right breast lumpectomy with RSL, right axillarty deep SLN and biopsy.   Operative Findings:       The port was inserted through the right subclavian vein uneventfully.  The catheter tip appears to be at the SVC right atrial junction.  At the completion of the case the C arm images look good and  the catheter flushed well and had excellent blood return.  The broad posterior margin of the lumpectomy specimen is the pectoralis muscle.  I thought that I was close superiorly so I generously reexcised the superior margin.  I found 3 or 4 sentinel lymph nodes that were hot and blue.  The specimen mammogram looked good.  The original marker clip and the radioactive seed were immediately adjacent to each other in the specimen.  The did not appear to be at the edge of the specimen.  Procedure in Detail:          Following the induction of general LMA anesthesia the patient's neck and chest were prepped and draped in a sterile fashion.  Her arms were tucked at her sides and there was a small roll behind her shoulders.  Intravenous antibiotics were given.  Surgical timeout was performed.  0.5% Marcaine with epinephrine was used as local infiltration anesthetic.      I injected 5 cc of dilute methylene blue into the right breast subareolar area and massaged the breast for a few minutes.       A right subclavian venipuncture was performed.  Blood return on a single pass.  The guidewire was threaded easily.  Fluoroscopy confirmed the guidewire in the right atrium.  Using the C arm I drew a template on the chest wall to guide positioning and length of the catheter.  A small incision was made at the wire insertion site.  Transverse incision was made below the more medial aspect of the right clavicle.  A subcutaneous pocket was created.  Using a tunneling device I passed the catheter from the wire insertion site to the port pocket site.  Using the template drawn on the chest wall I cut the catheter 24 cm in  length.  The catheter was secured to the port with the locking device.  The port and catheter were flushed with heparinized saline.  The port was sutured to the pectoralis fascia with 3 sutures of 2-0 Prolene.  The dilator and peel-away sheath were inserted over the wire without difficulty.  The wire and dilator were removed and the catheter threaded and the peel-away sheath removed.  The catheter flushed easily and had excellent blood return.  Fluoroscopy confirmed good positioning of the catheter tip at the cavoatrial junction.  There was no deformity of the catheter.  I flushed the port and catheter with concentrated heparin.  The incisions were closed with 3-0 Vicryl subcutaneous and 4-0 Monocryl subcuticular for the skin.  Dermabond was placed.     The patient was then repositioned with her arms out to her sides.  The neck and chest and lateral chest wall on the right were prepped and draped in a sterile fashion.  Using the neoprobe I isolated the radioactive signal in the upper outer quadrant of the right breast at about the 10:30 position.  A curvilinear incision was made.  The lumpectomy was performed using the neoprobe and electrocautery.  The specimen was removed and marked with silk sutures and a 6 color ink kit.  Specimen mammogram looked good as described above.  The specimen was sent to the lab where the seed was retrieved.  I generously reexcised the superior margin, reinked it and sent it as a separate specimen.  Hemostasis was excellent.  The wound was irrigated.  5 metal marker clips were placed in the lumpectomy cavity.  The lumpectomy cavity was closed in several layers with 3-0 Vicryl and the skin closed  with a running subcuticular 4-0 Monocryl and Dermabond      A transverse incision was made in the right axilla at the hairline.  With the neoprobe on the technetium setting I took the dissection down into the axillary space and was able to easily easily isolate and remove 4 or 5 sentinel lymph  nodes.  After this was done there was no more significant radioactivity.  Hemostasis was excellent and achieved with electrocautery and metal clips.  The wound was irrigated.  The clavipectoral fascia was closed with 3-0 Vicryl sutures and the skin closed with a running subcuticular 4-0 Monocryl and Dermabond.  Dry bandages and a breast binder were placed.  The patient tolerated the procedure well and was taken to PACU in stable condition.  EBL 25 cc.  Counts correct.  Complications none.  Chest x-ray is planned.   Addendum: I logged onto the PMP aware website and reviewed her prescription medication history     Solmon Bohr M. Dalbert Batman, M.D., FACS General and Minimally Invasive Surgery Breast and Colorectal Surgery  05/29/2019 9:28 AM

## 2019-05-29 NOTE — Transfer of Care (Signed)
Immediate Anesthesia Transfer of Care Note  Patient: FELISA VEGA  Procedure(s) Performed: RIGHT BREAST LUMPECTOMY WITH RADIOACTIVE SEED AND RIGHT DEEP AXILLARY SENTINEL LYMPH NODE BIOPSY INJECT BLUE DYE (Right Breast) INSERTION PORT-A-CATH WITH ULTRASOUND (N/A Breast)  Patient Location: PACU  Anesthesia Type:GA combined with regional for post-op pain  Level of Consciousness: awake, alert  and patient cooperative  Airway & Oxygen Therapy: Patient Spontanous Breathing and Patient connected to face mask oxygen  Post-op Assessment: Report given to RN and Post -op Vital signs reviewed and stable  Post vital signs: Reviewed and stable  Last Vitals:  Vitals Value Taken Time  BP 102/68 05/29/19 0934  Temp 36.5 C 05/29/19 0934  Pulse 58 05/29/19 0936  Resp 12 05/29/19 0936  SpO2 100 % 05/29/19 0936  Vitals shown include unvalidated device data.  Last Pain:  Vitals:   05/29/19 0934  TempSrc:   PainSc: Asleep      Patients Stated Pain Goal: 2 ([id number])  Complications: No apparent anesthesia complications

## 2019-05-29 NOTE — Interval H&P Note (Signed)
History and Physical Interval Note:  05/29/2019 5:51 AM  Megan Khan  has presented today for surgery, with the diagnosis of RIGHT BREAST CANCER.  The various methods of treatment have been discussed with the patient and family. After consideration of risks, benefits and other options for treatment, the patient has consented to  Procedure(s): RIGHT BREAST LUMPECTOMY WITH RADIOACTIVE SEED AND RIGHT DEEP AXILLARY SENTINEL LYMPH NODE BIOPSY INJECT BLUE DYE (Right) INSERTION PORT-A-CATH WITH ULTRASOUND (N/A) as a surgical intervention.  The patient's history has been reviewed, patient examined, no change in status, stable for surgery.  I have reviewed the patient's chart and labs.  Questions were answered to the patient's satisfaction.     [name]

## 2019-05-29 NOTE — Anesthesia Procedure Notes (Signed)
Procedure Name: LMA Insertion Date/Time: 05/29/2019 7:30 AM Performed by: Renato Shin, CRNA Pre-anesthesia Checklist: Patient identified, Emergency Drugs available, Suction available and Patient being monitored Patient Re-evaluated:Patient Re-evaluated prior to induction Oxygen Delivery Method: Circle system utilized Preoxygenation: Pre-oxygenation with 100% oxygen Induction Type: IV induction LMA: LMA with gastric port inserted LMA Size: 4.0 Number of attempts: 1 Placement Confirmation: positive ETCO2 and breath sounds checked- equal and bilateral Tube secured with: Tape Dental Injury: Teeth and Oropharynx as per pre-operative assessment

## 2019-05-29 NOTE — Discharge Instructions (Signed)
[place] Office Phone Number [phone]  BREAST BIOPSY/ PARTIAL MASTECTOMY: POST OP INSTRUCTIONS  Always review your discharge instruction sheet given to you by the facility where your surgery was performed.  IF YOU HAVE DISABILITY OR FAMILY LEAVE FORMS, YOU MUST BRING THEM TO THE OFFICE FOR PROCESSING.  DO NOT GIVE THEM TO YOUR DOCTOR.  1. A prescription for pain medication may be given to you upon discharge.  Take your pain medication as prescribed, if needed.  If narcotic pain medicine is not needed, then you may take acetaminophen (Tylenol) or ibuprofen (Advil) as needed. 2. Take your usually prescribed medications unless otherwise directed 3. If you need a refill on your pain medication, please contact your pharmacy.  They will contact our office to request authorization.  Prescriptions will not be filled after 5pm or on week-ends. 4. You should eat very light the first 24 hours after surgery, such as soup, crackers, pudding, etc.  Resume your normal diet the day after surgery. 5. Most patients will experience some swelling and bruising in the breast.  Ice packs and a good support bra will help.  Swelling and bruising can take several days to resolve.  6. It is common to experience some constipation if taking pain medication after surgery.  Increasing fluid intake and taking a stool softener will usually help or prevent this problem from occurring.  A mild laxative (Milk of Magnesia or Miralax) should be taken according to package directions if there are no bowel movements after 48 hours. 7. Unless discharge instructions indicate otherwise, you may remove your bandages 24-48 hours after surgery, and you may shower at that time.  You may have steri-strips (small skin tapes) in place directly over the incision.  These strips should be left on the skin for 7-10 days.  If your surgeon used skin glue on the incision, you may shower in 24 hours.  The glue will flake off over the  next 2-3 weeks.  Any sutures or staples will be removed at the office during your follow-up visit. 8. ACTIVITIES:  You may resume regular daily activities (gradually increasing) beginning the next day.  Wearing a good support bra or sports bra minimizes pain and swelling.  You may have sexual intercourse when it is comfortable. a. You may drive when you no longer are taking prescription pain medication, you can comfortably wear a seatbelt, and you can safely maneuver your car and apply brakes. b. RETURN TO WORK:  ______________________________________________________________________________________ 9. You should see your doctor in the office for a follow-up appointment approximately two weeks after your surgery.  Your doctors nurse will typically make your follow-up appointment when she calls you with your pathology report.  Expect your pathology report 2-3 business days after your surgery.  You may call to check if you do not hear from [place] after three days. 10. OTHER INSTRUCTIONS: _______________________________________________________________________________________________ _____________________________________________________________________________________________________________________________________ _____________________________________________________________________________________________________________________________________ _____________________________________________________________________________________________________________________________________  WHEN TO CALL YOUR DOCTOR: 1. Fever over 101.0 2. Nausea and/or vomiting. 3. Extreme swelling or bruising. 4. Continued bleeding from incision. 5. Increased pain, redness, or drainage from the incision.  The clinic staff is available to answer your questions during regular business hours.  Please dont hesitate to call and ask to speak to one of the nurses for clinical concerns.  If you have a medical emergency, go to the nearest  emergency room or call 911.  A surgeon from [facility] is always on call at the hospital.  For further questions, please visit [email or web address]  PORT-A-CATH: POST OP INSTRUCTIONS  Always review your discharge instruction sheet given to you by the facility where your surgery was performed.   1. A prescription for pain medication may be given to you upon discharge. Take your pain medication as prescribed, if needed. If narcotic pain medicine is not needed, then you make take acetaminophen (Tylenol) or ibuprofen (Advil) as needed.  2. Take your usually prescribed medications unless otherwise directed. 3. If you need a refill on your pain medication, please contact our office. All narcotic pain medicine now requires a paper prescription.  Phoned in and fax refills are no longer allowed by law.  Prescriptions will not be filled after 5 pm or on weekends.  4. You should follow a light diet for the remainder of the day after your procedure. 5. Most patients will experience some mild swelling and/or bruising in the area of the incision. It may take several days to resolve. 6. It is common to experience some constipation if taking pain medication after surgery. Increasing fluid intake and taking a stool softener (such as Colace) will usually help or prevent this problem from occurring. A mild laxative (Milk of Magnesia or Miralax) should be taken according to package directions if there are no bowel movements after 48 hours.  7. Unless discharge instructions indicate otherwise, you may remove your bandages 48 hours after surgery, and you may shower at that time. You may have steri-strips (small white skin tapes) in place directly over the incision.  These strips should be left on the skin for 7-10 days.  If your surgeon used Dermabond (skin glue) on the incision, you may shower in 24 hours.  The glue will flake off over the next 2-3 weeks.  8. If your port is left  accessed at the end of surgery (needle left in port), the dressing cannot get wet and should only by changed by a healthcare professional. When the port is no longer accessed (when the needle has been removed), follow step 7.   9. ACTIVITIES:  Limit activity involving your arms for the next 72 hours. Do no strenuous exercise or activity for 1 week. You may drive when you are no longer taking prescription pain medication, you can comfortably wear a seatbelt, and you can maneuver your car. 10.You may need to see your doctor in the office for a follow-up appointment.  Please       check with your doctor.  11.When you receive a new Port-a-Cath, you will get a product guide and        ID card.  Please keep them in case you need them.  WHEN TO CALL YOUR DOCTOR [phone]): 1. Fever over 101.0 2. Chills 3. Continued bleeding from incision 4. Increased redness and tenderness at the site 5. Shortness of breath, difficulty breathing   The clinic staff is available to answer your questions during regular business hours. Please dont hesitate to call and ask to speak to one of the nurses or medical assistants for clinical concerns. If you have a medical emergency, go to the nearest emergency room or call 911.  A surgeon from [facility] is always on call at the hospital.     For further information, please visit [email or web address]           Managing Your Pain After Surgery Without Opioids    Thank you for participating in our program to help patients manage their pain after surgery without opioids. This is part of our effort to provide  you with the best care possible, without exposing you or your family to the risk that opioids pose.  What pain can I expect after surgery? You can expect to have some pain after surgery. This is normal. The pain is typically worse the day after surgery, and quickly begins to get better. Many studies have found that many  patients are able to manage their pain after surgery with Over-the-Counter (OTC) medications such as Tylenol and Motrin. If you have a condition that does not allow you to take Tylenol or Motrin, notify your surgical team.  How will I manage my pain? The best strategy for controlling your pain after surgery is around the clock pain control with Tylenol (acetaminophen) and Motrin (ibuprofen or Advil). Alternating these medications with each other allows you to maximize your pain control. In addition to Tylenol and Motrin, you can use heating pads or ice packs on your incisions to help reduce your pain.  How will I alternate your regular strength over-the-counter pain medication? You will take a dose of pain medication every three hours. ; Start by taking 650 mg of Tylenol (2 pills of 325 mg) ; 3 hours later take 600 mg of Motrin (3 pills of 200 mg) ; 3 hours after taking the Motrin take 650 mg of Tylenol ; 3 hours after that take 600 mg of Motrin.   - 1 -  See example - if your first dose of Tylenol is at 12:00 PM   12:00 PM Tylenol 650 mg (2 pills of 325 mg)  3:00 PM Motrin 600 mg (3 pills of 200 mg)  6:00 PM Tylenol 650 mg (2 pills of 325 mg)  9:00 PM Motrin 600 mg (3 pills of 200 mg)  Continue alternating every 3 hours   We recommend that you follow this schedule around-the-clock for at least 3 days after surgery, or until you feel that it is no longer needed. Use the table on the last page of this handout to keep track of the medications you are taking. Important: Do not take more than 3000mg  of Tylenol or 3200mg  of Motrin in a 24-hour period. Do not take ibuprofen/Motrin if you have a history of bleeding stomach ulcers, severe kidney disease, &/or actively taking a blood thinner  What if I still have pain? If you have pain that is not controlled with the over-the-counter pain medications (Tylenol and Motrin or Advil) you might have what we call breakthrough pain. You will receive  a prescription for a small amount of an opioid pain medication such as Oxycodone, Tramadol, or Tylenol with Codeine. Use these opioid pills in the first 24 hours after surgery if you have breakthrough pain. Do not take more than 1 pill every 4-6 hours.  If you still have uncontrolled pain after using all opioid pills, don't hesitate to call our staff using the number provided. We will help make sure you are managing your pain in the best way possible, and if necessary, we can provide a prescription for additional pain medication.   Day 1    Time  Name of Medication Number of pills taken  Amount of Acetaminophen  Pain Level   Comments  AM PM       AM PM       AM PM       AM PM       AM PM       AM PM       AM PM  AM PM       Total Daily amount of Acetaminophen Do not take more than  3,000 mg per day      Day 2    Time  Name of Medication Number of pills taken  Amount of Acetaminophen  Pain Level   Comments  AM PM       AM PM       AM PM       AM PM       AM PM       AM PM       AM PM       AM PM       Total Daily amount of Acetaminophen Do not take more than  3,000 mg per day      Day 3    Time  Name of Medication Number of pills taken  Amount of Acetaminophen  Pain Level   Comments  AM PM       AM PM       AM PM       AM PM          AM PM       AM PM       AM PM       AM PM       Total Daily amount of Acetaminophen Do not take more than  3,000 mg per day      Day 4    Time  Name of Medication Number of pills taken  Amount of Acetaminophen  Pain Level   Comments  AM PM       AM PM       AM PM       AM PM       AM PM       AM PM       AM PM       AM PM       Total Daily amount of Acetaminophen Do not take more than  3,000 mg per day      Day 5    Time  Name of Medication Number of pills taken  Amount of Acetaminophen  Pain Level   Comments  AM PM       AM PM       AM PM       AM PM       AM PM       AM PM         AM PM       AM PM       Total Daily amount of Acetaminophen Do not take more than  3,000 mg per day       Day 6    Time  Name of Medication Number of pills taken  Amount of Acetaminophen  Pain Level  Comments  AM PM       AM PM       AM PM       AM PM       AM PM       AM PM       AM PM       AM PM       Total Daily amount of Acetaminophen Do not take more than  3,000 mg per day      Day 7    Time  Name of Medication Number of pills taken  Amount of Acetaminophen  Pain Level  Comments  AM PM       AM PM       AM PM       AM PM       AM PM       AM PM       AM PM       AM PM       Total Daily amount of Acetaminophen Do not take more than  3,000 mg per day        For additional information about how and where to safely dispose of unused opioid medications - [employer]  Disclaimer: This document contains information and/or instructional materials adapted from [place] for the typical patient with your condition. It does not replace medical advice from your health care provider because your experience may differ from that of the typical patient. Talk to your health care provider if you have any questions about this document, your condition or your treatment plan. Adapted from [place]

## 2019-05-29 NOTE — Anesthesia Postprocedure Evaluation (Signed)
Anesthesia Post Note  Patient: Megan Khan  Procedure(s) Performed: RIGHT BREAST LUMPECTOMY WITH RADIOACTIVE SEED AND RIGHT DEEP AXILLARY SENTINEL LYMPH NODE BIOPSY INJECT BLUE DYE (Right Breast) INSERTION PORT-A-CATH WITH ULTRASOUND (N/A Breast)     Patient location during evaluation: PACU Anesthesia Type: General Level of consciousness: awake and alert Pain management: pain level controlled Vital Signs Assessment: post-procedure vital signs reviewed and stable Respiratory status: spontaneous breathing, nonlabored ventilation, respiratory function stable and patient connected to nasal cannula oxygen Cardiovascular status: blood pressure returned to baseline and stable Postop Assessment: no apparent nausea or vomiting Anesthetic complications: no    Last Vitals:  Vitals:   05/29/19 1100 05/29/19 1105  BP:  110/85  Pulse: 65 65  Resp: 13 17  Temp: 36.5 C   SpO2: 94% 94%    Last Pain:  Vitals:   05/29/19 1030  TempSrc:   PainSc: Craig A Houser

## 2019-05-29 NOTE — Anesthesia Procedure Notes (Signed)
Anesthesia Regional Block: Pectoralis block   Pre-Anesthetic Checklist: ,, timeout performed, Correct Patient, Correct Site, Correct Laterality, Correct Procedure, Correct Position, site marked, Risks and benefits discussed,  Surgical consent,  Pre-op evaluation,  At surgeon's request and post-op pain management  Laterality: Right  Prep: chloraprep       Needles:  Injection technique: Single-shot  Needle Type: Echogenic Needle     Needle Length: 9cm  Needle Gauge: 21     Additional Needles:   Procedures:,,,, ultrasound used (permanent image in chart),,,,  Narrative:  Start time: 05/29/2019 6:52 AM End time: 05/29/2019 7:02 AM Injection made incrementally with aspirations every 5 mL.  Performed by: Personally  Anesthesiologist: Barnet Glasgow, MD  Additional Notes: Block assessed. Patient tolerated procedure well.

## 2019-06-01 ENCOUNTER — Encounter (HOSPITAL_COMMUNITY): Payer: Self-pay | Admitting: General Surgery

## 2019-06-02 LAB — SURGICAL PATHOLOGY

## 2019-06-03 ENCOUNTER — Other Ambulatory Visit: Payer: Self-pay | Admitting: General Surgery

## 2019-06-04 ENCOUNTER — Other Ambulatory Visit: Payer: Self-pay | Admitting: Oncology

## 2019-06-04 NOTE — H&P (Signed)
Megan Khan  Location: [facility] Patient #: [id number] DOB: [birth year] Single / Language: [name] / Race: Black or African American Female      History of Present Illness  This is a very pleasant 63 year old woman for who returnsto the operating room for reexcision of margins. Dr. Jana Hakim involved in her care. Radiology is at [facility]. PCP is Dr. [name].  Recent mammogram show a 10 mm mass in the right breast, 11 o'clock position, 7 cm from the nipple. Axillary ultrasound negative. 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 She has seen Dr. Jana Hakim. She has been discussed in breast conference. Chemotherapy is recommended and the patient agrees to that  Comorbidities include legal blindness. Non-insulin-dependent diabetes. Hypertension. One sister was treated for breast cancer by Dr. Jana Hakim and Dr. Barry Dienes. A maternal cousin was also treated for breast cancer by me. Mother deceased with diabetes. Father deceased with dementia Social history reveals she is single. Lives with her son. Denies tobacco or echo call. PCP at [facility].  She recently underwent Port-A-Cath insertion, right breast lumpectomy with reexcision of the superior margin, and sentinel lymph node biopsy Final pathology shows that all of the sentinel nodes are negative.  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  Resection margins were negative for invasive cancer. Both the original superior margin and the reexcised superior margin are positive for DCIS, high-grade The inferior margin is less than 1 mm , also DCIS. We have discussed this with the patient and have advised reexcision of the superior and inferior margin And have discussed the indications, techniques, and risk of the surgery with her in detail She agrees with this plan. We will schedule her for right lumpectomy reexcision of superior and inferior  margins as soon as possible.  She knows that mastectomy is an option but would like to continue to pursue breast conservation, as long as we can control her disease with margin reexcision.     Past Surgical History No pertinent past surgical history   Diagnostic Studies History Colonoscopy  1-5 years ago Mammogram  within last year 1-3 years ago Pap Smear  1-5 years ago  Medication History AmLODIPine Besylate ('10MG'$  Tablet, Oral) Active. Aspirin ('81MG'$  Tablet Chewable, Oral) Active. Lisinopril-Hydrochlorothiazide (20-'25MG'$  Tablet, Oral) Active. Lovastatin ('20MG'$  Tablet, Oral) Active. MetFORMIN HCl ('1000MG'$  Tablet, Oral) Active. Metoprolol Succinate ER ('50MG'$  Tablet ER 24HR, Oral) Active. Travatan Z (0.004% Solution, Ophthalmic) Active. Medications Reconciled  Social History Alcohol use  Remotely quit alcohol use. Caffeine use  Coffee, Tea. Illicit drug use  Remotely quit drug use. Tobacco use  Former smoker.  Family History  Alcohol Abuse  Brother, Father, Sister. Breast Cancer  Sister. Diabetes Mellitus  Mother, Sister. Heart Disease  Brother, Mother. Hypertension  Brother, Father, Mother, Sister. Melanoma  Mother.  Pregnancy / Birth History  Age at menarche  81 years, 46 years. Age of menopause  <46 Contraceptive History  Oral contraceptives. Gravida  3 Irregular periods  Length (months) of breastfeeding  3-6 Maternal age  83-20 Para  3  Other Problems  Diabetes Mellitus  High blood pressure   Vitals  Weight: 158.38 lb Height: 61in Body Surface Area: 1.71 m Body Mass Index: 29.92 kg/m  Temp.: 97.72F  Pulse: 87 (Regular)  P.OX: 97% (Room air) BP: 140/98 (Sitting, Left Arm, Standard)       Physical Exam General Mental Status-Alert. General Appearance-Not in acute distress. Build & Nutrition-Well nourished. Posture-Normal posture. Gait-Normal.  Head and Neck Head-normocephalic, atraumatic with  no lesions or palpable masses. Trachea-midline. Thyroid Gland Characteristics - normal size and consistency and no palpable nodules.  Chest and Lung Exam Chest and lung exam reveals -on auscultation, normal breath sounds, no adventitious sounds and normal vocal resonance.  Breast Note: Breasts are symmetrical. Ecchymoses right breast that resolved. Surgical scar noted and healing without signs of infection or hematoma. No other changes. No axillary adenopathy.axillary scar healing well.  Port-A-Cath scar healing well.   Cardiovascular Cardiovascular examination reveals -normal heart sounds, regular rate and rhythm with no murmurs and femoral artery auscultation bilaterally reveals normal pulses, no bruits, no thrills.  Abdomen Inspection Inspection of the abdomen reveals - No Hernias. Palpation/Percussion Palpation and Percussion of the abdomen reveal - Soft, Non Tender, No Rigidity (guarding), No hepatosplenomegaly and No Palpable abdominal masses.  Neurologic Neurologic evaluation reveals -alert and oriented x 3 with no impairment of recent or remote memory, normal attention span and ability to concentrate, normal sensation and normal coordination.  Musculoskeletal Normal Exam - Bilateral-Upper Extremity Strength Normal and Lower Extremity Strength Normal.    Assessment & Plan [facility] M. Dalbert Batman MD; 04/09/2019 11:50 AM) PRIMARY CANCER OF UPPER OUTER QUADRANT OF RIGHT FEMALE BREAST (C50.411)    You have been diagnosed with a hormone receptor negative, HER-2 positive cancer in the right breast upper outer quadrant. This appears localized you have seen Dr. Jana Hakim and he has advised Port-A-Cath insertion and chemotherapy and you agree  You have undergone  Port-A-Cath insertion with ultrasound, inject blue dye right breast, right breast lumpectomy with radioactive seed localization and right axillary sentinel lymph node biopsy  We reexcised the superior  margin.  Final pathology shows cancer to be present at the superior margin and very close to the inferior margin We have advised surgery to re-excise the superior and inferior margin, and you have agreed to this This will be scheduled as soon as possible in the near future.    FAMILY HISTORY OF BREAST CANCER (Z80.3) TYPE 2 DIABETES MELLITUS TREATED WITHOUT INSULIN (E11.9) LEGALLY BLIND (H54.8) HYPERTENSION, ESSENTIAL (I10) EPIGASTRIC HERNIA (K43.9)    Deloise Marchant M. Dalbert Batman, M.D., [facility] General and Minimally invasive Surgery Breast and Colorectal Surgery

## 2019-06-05 ENCOUNTER — Telehealth: Payer: Self-pay

## 2019-06-05 NOTE — Telephone Encounter (Signed)
Corrected DMA form faxed to [facility].  A copy has been left at front desk for pt's sister, [name], to pick up.

## 2019-06-06 ENCOUNTER — Other Ambulatory Visit (HOSPITAL_COMMUNITY)
Admission: RE | Admit: 2019-06-06 | Discharge: 2019-06-06 | Disposition: A | Discharge status: Home / Self Care | Payer: Medicare Other | Source: Ambulatory Visit | Attending: General Surgery | Admitting: General Surgery

## 2019-06-06 DIAGNOSIS — Z01812 Encounter for preprocedural laboratory examination: Secondary | ICD-10-CM | POA: Insufficient documentation

## 2019-06-06 DIAGNOSIS — Z20828 Contact with and (suspected) exposure to other viral communicable diseases: Secondary | ICD-10-CM | POA: Insufficient documentation

## 2019-06-07 ENCOUNTER — Other Ambulatory Visit: Payer: Self-pay | Admitting: Family Medicine

## 2019-06-07 DIAGNOSIS — G8929 Other chronic pain: Secondary | ICD-10-CM

## 2019-06-07 DIAGNOSIS — M25511 Pain in right shoulder: Secondary | ICD-10-CM

## 2019-06-07 LAB — NOVEL CORONAVIRUS, NAA (HOSP ORDER, SEND-OUT TO REF LAB; TAT 18-24 HRS): SARS-CoV-2, NAA: NOT DETECTED

## 2019-06-08 ENCOUNTER — Encounter (HOSPITAL_COMMUNITY): Payer: Self-pay | Admitting: *Deleted

## 2019-06-09 ENCOUNTER — Encounter (HOSPITAL_COMMUNITY): Payer: Self-pay | Admitting: *Deleted

## 2019-06-09 NOTE — Progress Notes (Signed)
Ms Sula denies chest pain or shortness of breath. Patient tested negative 10/24 and she has been in quarantine since that time.  Ms Cromwell has type II diabetes.  Patient states that CBG today was 124, it has been high but now it is around 120's I instructed patient to not take Metformin tomorrow am. I instructed patient to check CBG after awaking and every 2 hours until arrival  to the hospital.  I Instructed patient if CBG is less than 70 to take 4 Glucose Tablets. Recheck CBG in 15 minutes then call pre- op desk at [phone] for further instructions.

## 2019-06-10 ENCOUNTER — Ambulatory Visit (HOSPITAL_COMMUNITY): Payer: Medicare Other | Admitting: Certified Registered"

## 2019-06-10 ENCOUNTER — Other Ambulatory Visit: Payer: Self-pay

## 2019-06-10 ENCOUNTER — Encounter (HOSPITAL_COMMUNITY)
Admission: RE | Disposition: A | Discharge status: Home / Self Care | Payer: Self-pay | Source: Home / Self Care | Attending: General Surgery

## 2019-06-10 ENCOUNTER — Encounter (HOSPITAL_COMMUNITY): Payer: Self-pay | Admitting: *Deleted

## 2019-06-10 ENCOUNTER — Ambulatory Visit (HOSPITAL_COMMUNITY)
Admission: RE | Admit: 2019-06-10 | Discharge: 2019-06-10 | Disposition: A | Discharge status: Home / Self Care | Payer: Medicare Other | Attending: General Surgery | Admitting: General Surgery

## 2019-06-10 DIAGNOSIS — I10 Essential (primary) hypertension: Secondary | ICD-10-CM | POA: Diagnosis not present

## 2019-06-10 DIAGNOSIS — D649 Anemia, unspecified: Secondary | ICD-10-CM | POA: Diagnosis not present

## 2019-06-10 DIAGNOSIS — D0511 Intraductal carcinoma in situ of right breast: Secondary | ICD-10-CM | POA: Insufficient documentation

## 2019-06-10 DIAGNOSIS — Z170 Estrogen receptor positive status [ER+]: Secondary | ICD-10-CM | POA: Diagnosis not present

## 2019-06-10 DIAGNOSIS — H548 Legal blindness, as defined in USA: Secondary | ICD-10-CM | POA: Diagnosis not present

## 2019-06-10 DIAGNOSIS — G709 Myoneural disorder, unspecified: Secondary | ICD-10-CM | POA: Insufficient documentation

## 2019-06-10 DIAGNOSIS — Z7984 Long term (current) use of oral hypoglycemic drugs: Secondary | ICD-10-CM | POA: Diagnosis not present

## 2019-06-10 DIAGNOSIS — Z79899 Other long term (current) drug therapy: Secondary | ICD-10-CM | POA: Diagnosis not present

## 2019-06-10 DIAGNOSIS — Z811 Family history of alcohol abuse and dependence: Secondary | ICD-10-CM | POA: Diagnosis not present

## 2019-06-10 DIAGNOSIS — C50411 Malignant neoplasm of upper-outer quadrant of right female breast: Secondary | ICD-10-CM

## 2019-06-10 DIAGNOSIS — Z820 Family history of epilepsy and other diseases of the nervous system: Secondary | ICD-10-CM | POA: Insufficient documentation

## 2019-06-10 DIAGNOSIS — E119 Type 2 diabetes mellitus without complications: Secondary | ICD-10-CM | POA: Insufficient documentation

## 2019-06-10 DIAGNOSIS — Z87891 Personal history of nicotine dependence: Secondary | ICD-10-CM | POA: Insufficient documentation

## 2019-06-10 DIAGNOSIS — Z803 Family history of malignant neoplasm of breast: Secondary | ICD-10-CM | POA: Diagnosis not present

## 2019-06-10 DIAGNOSIS — Z809 Family history of malignant neoplasm, unspecified: Secondary | ICD-10-CM | POA: Diagnosis not present

## 2019-06-10 DIAGNOSIS — Z7982 Long term (current) use of aspirin: Secondary | ICD-10-CM | POA: Insufficient documentation

## 2019-06-10 DIAGNOSIS — Z833 Family history of diabetes mellitus: Secondary | ICD-10-CM | POA: Diagnosis not present

## 2019-06-10 DIAGNOSIS — Z8249 Family history of ischemic heart disease and other diseases of the circulatory system: Secondary | ICD-10-CM | POA: Diagnosis not present

## 2019-06-10 DIAGNOSIS — Z171 Estrogen receptor negative status [ER-]: Secondary | ICD-10-CM

## 2019-06-10 HISTORY — PX: RE-EXCISION OF BREAST LUMPECTOMY: SHX6048

## 2019-06-10 LAB — GLUCOSE, CAPILLARY
Glucose-Capillary: 102 mg/dL — ABNORMAL HIGH (ref 70–99)
Glucose-Capillary: 103 mg/dL — ABNORMAL HIGH (ref 70–99)
Glucose-Capillary: 111 mg/dL — ABNORMAL HIGH (ref 70–99)

## 2019-06-10 LAB — BASIC METABOLIC PANEL
Anion gap: 11 (ref 5–15)
BUN: 8 mg/dL (ref 8–23)
CO2: 23 mmol/L (ref 22–32)
Calcium: 9.4 mg/dL (ref 8.9–10.3)
Chloride: 106 mmol/L (ref 98–111)
Creatinine, Ser: 0.66 mg/dL (ref 0.44–1.00)
GFR calc Af Amer: >60 mL/min (ref >60)
GFR calc non Af Amer: >60 mL/min (ref >60)
Glucose, Bld: 111 mg/dL — ABNORMAL HIGH (ref 70–99)
Potassium: 3 mmol/L — ABNORMAL LOW (ref 3.5–5.1)
Sodium: 140 mmol/L (ref 135–145)

## 2019-06-10 LAB — CBC
HCT: 41.2 % (ref 36.0–46.0)
Hemoglobin: 13.3 g/dL (ref 12.0–15.0)
MCH: 26.9 pg (ref 26.0–34.0)
MCHC: 32.3 g/dL (ref 30.0–36.0)
MCV: 83.2 fL (ref 80.0–100.0)
Platelets: 270 10*3/uL (ref 150–400)
RBC: 4.95 MIL/uL (ref 3.87–5.11)
RDW: 13.2 % (ref 11.5–15.5)
WBC: 5.8 10*3/uL (ref 4.0–10.5)
nRBC: 0 % (ref 0.0–0.2)

## 2019-06-10 SURGERY — EXCISION, LESION, BREAST
Anesthesia: General | Site: Breast | Laterality: Right

## 2019-06-10 MED ORDER — FENTANYL CITRATE (PF) 250 MCG/5ML IJ SOLN
INTRAMUSCULAR | Status: AC
  Filled 2019-06-10: qty 5

## 2019-06-10 MED ORDER — FENTANYL CITRATE (PF) 100 MCG/2ML IJ SOLN
INTRAMUSCULAR | Status: AC
  Filled 2019-06-10: qty 2

## 2019-06-10 MED ORDER — MIDAZOLAM HCL 2 MG/2ML IJ SOLN
INTRAMUSCULAR | Status: AC
  Filled 2019-06-10: qty 2

## 2019-06-10 MED ORDER — BUPIVACAINE-EPINEPHRINE 0.5% -1:200000 IJ SOLN
INTRAMUSCULAR | Status: AC
  Filled 2019-06-10: qty 1

## 2019-06-10 MED ORDER — DEXAMETHASONE SODIUM PHOSPHATE 10 MG/ML IJ SOLN
INTRAMUSCULAR | Status: AC
  Filled 2019-06-10: qty 1

## 2019-06-10 MED ORDER — CELECOXIB 200 MG PO CAPS
200.0000 mg | ORAL_CAPSULE | ORAL | Status: AC
  Administered 2019-06-10: 200 mg via ORAL
  Filled 2019-06-10: qty 1

## 2019-06-10 MED ORDER — SODIUM CHLORIDE 0.9 % IV SOLN: 250.0000 mL | INTRAVENOUS | Status: DC | PRN

## 2019-06-10 MED ORDER — EPHEDRINE SULFATE 50 MG/ML IJ SOLN
INTRAMUSCULAR | Status: DC | PRN
  Administered 2019-06-10: 5 mg via INTRAVENOUS

## 2019-06-10 MED ORDER — EPHEDRINE 5 MG/ML INJ
INTRAVENOUS | Status: AC
  Filled 2019-06-10: qty 10

## 2019-06-10 MED ORDER — LIDOCAINE 2% (20 MG/ML) 5 ML SYRINGE
INTRAMUSCULAR | Status: AC
  Filled 2019-06-10: qty 5

## 2019-06-10 MED ORDER — ACETAMINOPHEN 650 MG RE SUPP: 650.0000 mg | RECTAL | Status: DC | PRN

## 2019-06-10 MED ORDER — FENTANYL CITRATE (PF) 100 MCG/2ML IJ SOLN: 25.0000 ug | INTRAMUSCULAR | Status: DC | PRN

## 2019-06-10 MED ORDER — LACTATED RINGERS IV SOLN
INTRAVENOUS | Status: DC
  Administered 2019-06-10 (×2): via INTRAVENOUS

## 2019-06-10 MED ORDER — HYDROCODONE-ACETAMINOPHEN 5-325 MG PO TABS: 1.0000 | ORAL_TABLET | Freq: Four times a day (QID) | ORAL | 0 refills | Status: DC | PRN

## 2019-06-10 MED ORDER — CHLORHEXIDINE GLUCONATE CLOTH 2 % EX PADS: 6.0000 | MEDICATED_PAD | Freq: Once | CUTANEOUS | Status: DC

## 2019-06-10 MED ORDER — PROPOFOL 10 MG/ML IV BOLUS
INTRAVENOUS | Status: DC | PRN
  Administered 2019-06-10: 20 mg via INTRAVENOUS
  Administered 2019-06-10: 160 mg via INTRAVENOUS

## 2019-06-10 MED ORDER — ONDANSETRON HCL 4 MG/2ML IJ SOLN
INTRAMUSCULAR | Status: DC | PRN
  Administered 2019-06-10: 4 mg via INTRAVENOUS

## 2019-06-10 MED ORDER — BUPIVACAINE-EPINEPHRINE 0.5% -1:200000 IJ SOLN
INTRAMUSCULAR | Status: DC | PRN
  Administered 2019-06-10: 10 mL

## 2019-06-10 MED ORDER — SODIUM CHLORIDE 0.9% FLUSH: 3.0000 mL | INTRAVENOUS | Status: DC | PRN

## 2019-06-10 MED ORDER — LIDOCAINE 2% (20 MG/ML) 5 ML SYRINGE
INTRAMUSCULAR | Status: DC | PRN
  Administered 2019-06-10: 60 mg via INTRAVENOUS

## 2019-06-10 MED ORDER — MIDAZOLAM HCL 5 MG/5ML IJ SOLN
INTRAMUSCULAR | Status: DC | PRN
  Administered 2019-06-10: 1 mg via INTRAVENOUS

## 2019-06-10 MED ORDER — ACETAMINOPHEN 325 MG PO TABS: 650.0000 mg | ORAL_TABLET | ORAL | Status: DC | PRN

## 2019-06-10 MED ORDER — DEXAMETHASONE SODIUM PHOSPHATE 4 MG/ML IJ SOLN
INTRAMUSCULAR | Status: DC | PRN
  Administered 2019-06-10: 4 mg via INTRAVENOUS

## 2019-06-10 MED ORDER — GABAPENTIN 300 MG PO CAPS
300.0000 mg | ORAL_CAPSULE | ORAL | Status: AC
  Administered 2019-06-10: 300 mg via ORAL
  Filled 2019-06-10: qty 1

## 2019-06-10 MED ORDER — CEFAZOLIN SODIUM-DEXTROSE 2-4 GM/100ML-% IV SOLN
2.0000 g | INTRAVENOUS | Status: AC
  Administered 2019-06-10: 2 g via INTRAVENOUS
  Filled 2019-06-10: qty 100

## 2019-06-10 MED ORDER — 0.9 % SODIUM CHLORIDE (POUR BTL) OPTIME
TOPICAL | Status: DC | PRN
  Administered 2019-06-10: 1000 mL

## 2019-06-10 MED ORDER — OXYCODONE HCL 5 MG PO TABS: 5.0000 mg | ORAL_TABLET | ORAL | Status: DC | PRN

## 2019-06-10 MED ORDER — FENTANYL CITRATE (PF) 100 MCG/2ML IJ SOLN
INTRAMUSCULAR | Status: DC | PRN
  Administered 2019-06-10: 50 ug via INTRAVENOUS
  Administered 2019-06-10 (×2): 25 ug via INTRAVENOUS

## 2019-06-10 MED ORDER — BUPIVACAINE HCL (PF) 0.25 % IJ SOLN
INTRAMUSCULAR | Status: AC
  Filled 2019-06-10: qty 30

## 2019-06-10 MED ORDER — FENTANYL CITRATE (PF) 100 MCG/2ML IJ SOLN
25.0000 ug | INTRAMUSCULAR | Status: DC | PRN
  Administered 2019-06-10 (×2): 25 ug via INTRAVENOUS

## 2019-06-10 MED ORDER — LACTATED RINGERS IV SOLN: INTRAVENOUS | Status: DC

## 2019-06-10 MED ORDER — ACETAMINOPHEN 500 MG PO TABS
1000.0000 mg | ORAL_TABLET | ORAL | Status: AC
  Administered 2019-06-10: 1000 mg via ORAL
  Filled 2019-06-10: qty 2

## 2019-06-10 MED ORDER — ONDANSETRON HCL 4 MG/2ML IJ SOLN
INTRAMUSCULAR | Status: AC
  Filled 2019-06-10: qty 2

## 2019-06-10 MED ORDER — SODIUM CHLORIDE 0.9% FLUSH: 3.0000 mL | Freq: Two times a day (BID) | INTRAVENOUS | Status: DC

## 2019-06-10 MED ORDER — PROPOFOL 1000 MG/100ML IV EMUL
INTRAVENOUS | Status: AC
  Filled 2019-06-10: qty 100

## 2019-06-10 SURGICAL SUPPLY — 43 items
ADH SKN CLS APL DERMABOND .7 (GAUZE/BANDAGES/DRESSINGS) ×1
APPLIER CLIP 9.375 MED OPEN (MISCELLANEOUS)
APR CLP MED 9.3 20 MLT OPN (MISCELLANEOUS)
BINDER BREAST LRG (GAUZE/BANDAGES/DRESSINGS) ×3 IMPLANT
CANISTER SUCT 3000ML PPV (MISCELLANEOUS) ×3 IMPLANT
CHLORAPREP W/TINT 26 (MISCELLANEOUS) ×3 IMPLANT
CLIP APPLIE 9.375 MED OPEN (MISCELLANEOUS) IMPLANT
COVER PROBE W GEL 5X96 (DRAPES) IMPLANT
COVER SURGICAL LIGHT HANDLE (MISCELLANEOUS) ×3 IMPLANT
COVER WAND RF STERILE (DRAPES) ×3 IMPLANT
DECANTER SPIKE VIAL GLASS SM (MISCELLANEOUS) ×3 IMPLANT
DERMABOND ADVANCED (GAUZE/BANDAGES/DRESSINGS) ×2
DERMABOND ADVANCED .7 DNX12 (GAUZE/BANDAGES/DRESSINGS) ×1 IMPLANT
DEVICE DUBIN SPECIMEN MAMMOGRA (MISCELLANEOUS) IMPLANT
DRAPE CHEST BREAST 15X10 FENES (DRAPES) ×3 IMPLANT
ELECT REM PT RETURN 9FT ADLT (ELECTROSURGICAL) ×3
ELECTRODE REM PT RTRN 9FT ADLT (ELECTROSURGICAL) ×1 IMPLANT
GAUZE SPONGE 4X4 12PLY STRL (GAUZE/BANDAGES/DRESSINGS) ×3 IMPLANT
GLOVE SS BIOGEL STRL SZ 7 (GLOVE) ×1 IMPLANT
GLOVE SUPERSENSE BIOGEL SZ 7 (GLOVE) ×2
GOWN STRL REUS W/ TWL LRG LVL3 (GOWN DISPOSABLE) ×1 IMPLANT
GOWN STRL REUS W/ TWL XL LVL3 (GOWN DISPOSABLE) ×1 IMPLANT
GOWN STRL REUS W/TWL LRG LVL3 (GOWN DISPOSABLE) ×3
GOWN STRL REUS W/TWL XL LVL3 (GOWN DISPOSABLE) ×2
ILLUMINATOR WAVEGUIDE N/F (MISCELLANEOUS) IMPLANT
KIT BASIN OR (CUSTOM PROCEDURE TRAY) ×3 IMPLANT
KIT MARKER MARGIN INK (KITS) IMPLANT
KIT TURNOVER KIT B (KITS) ×3 IMPLANT
LIGHT WAVEGUIDE WIDE FLAT (MISCELLANEOUS) IMPLANT
NEEDLE HYPO 25GX1X1/2 BEV (NEEDLE) ×3 IMPLANT
NS IRRIG 1000ML POUR BTL (IV SOLUTION) ×3 IMPLANT
PACK GENERAL/GYN (CUSTOM PROCEDURE TRAY) ×3 IMPLANT
PAD ABD 8X10 STRL (GAUZE/BANDAGES/DRESSINGS) ×3 IMPLANT
PAD ARMBOARD 7.5X6 YLW CONV (MISCELLANEOUS) ×6 IMPLANT
PENCIL SMOKE EVACUATOR (MISCELLANEOUS) ×3 IMPLANT
SPONGE LAP 4X18 RFD (DISPOSABLE) ×3 IMPLANT
STAPLER VISISTAT 35W (STAPLE) ×3 IMPLANT
SUT MNCRL AB 4-0 PS2 18 (SUTURE) ×3 IMPLANT
SUT SILK 2 0 SH (SUTURE) IMPLANT
SUT VIC AB 3-0 SH 18 (SUTURE) ×3 IMPLANT
SYR CONTROL 10ML LL (SYRINGE) ×3 IMPLANT
TOWEL GREEN STERILE (TOWEL DISPOSABLE) ×3 IMPLANT
TOWEL GREEN STERILE FF (TOWEL DISPOSABLE) ×3 IMPLANT

## 2019-06-10 NOTE — Anesthesia Preprocedure Evaluation (Addendum)
Anesthesia Evaluation  Patient identified by MRN, date of birth, ID band Patient awake    Reviewed: Allergy & Precautions, NPO status , Patient's Chart, lab work & pertinent test results  Airway Mallampati: II  TM Distance: >3 FB     Dental   Pulmonary former smoker,    breath sounds clear to auscultation       Cardiovascular hypertension, + Valvular Problems/Murmurs  Rhythm:Regular Rate:Normal     Neuro/Psych  Neuromuscular disease    GI/Hepatic negative GI ROS, Neg liver ROS,   Endo/Other  diabetes  Renal/GU negative Renal ROS     Musculoskeletal   Abdominal   Peds  Hematology  (+) anemia ,   Anesthesia Other Findings   Reproductive/Obstetrics                             Anesthesia Physical Anesthesia Plan  ASA: III  Anesthesia Plan: General   Post-op Pain Management:    Induction: Intravenous  PONV Risk Score and Plan: 3 and Ondansetron, Dexamethasone and Midazolam  Airway Management Planned: LMA  Additional Equipment:   Intra-op Plan:   Post-operative Plan: Extubation in OR  Informed Consent: I have reviewed the patients History and Physical, chart, labs and discussed the procedure including the risks, benefits and alternatives for the proposed anesthesia with the patient or authorized representative who has indicated his/her understanding and acceptance.     Dental advisory given  Plan Discussed with: CRNA and Anesthesiologist  Anesthesia Plan Comments:         Anesthesia Quick Evaluation

## 2019-06-10 NOTE — Op Note (Signed)
Patient Name:           Megan Khan   Date of Surgery:        06/10/2019  Pre op Diagnosis:      Invasive ductal carcinoma right breast, upper outer quadrant                                      Ductal carcinoma in situ right breast                                       Positive superior margin following lumpectomy                                       Less than 1 mm margin inferiorly  Post op Diagnosis:    Same  Procedure:                 Reexcision superior margin and reexcision inferior margin, right breast lumpectomy site  Surgeon:                     Edsel Petrin. Dalbert Batman, M.D., FACS  Assistant:                      Or staff  Operative Indications:   This is a very pleasant 63 year old woman for who returnsto the operating PCP is Dr. [name].       Recent mammogram show a 10 mm mass in the right breast, 11 o'clock position, 7 cm from the nipple. Axillary ultrasound negative. 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 She has seen Dr. Jana Hakim. She has been discussed in breast conference. Chemotherapy is recommended and the patient agrees to that      Comorbidities include legal blindness. Non-insulin-dependent diabetes. Hypertension. One sister was treated for breast cancer by Dr. Jana Hakim and Dr. Barry Dienes. A maternal cousin was also treated for breast cancer by me. Mother deceased with diabetes. Father deceased with dementia.        She recently underwent Port-A-Cath insertion, right breast lumpectomy with reexcision of the superior margin, and sentinel lymph node biopsy Final pathology shows that all of the sentinel nodes are negative.  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  Resection margins were negative for invasive cancer. Both the original superior margin and the reexcised superior margin are positive for DCIS, high-grade The inferior margin is less than 1 mm , also DCIS. We have  discussed this with the patient and have advised reexcision of the superior and inferior margin And have discussed the indications, techniques, and risk of the surgery with her in detail She agrees with this plan. We will schedule her for right lumpectomy reexcision of superior and inferior margins as soon as possible.     She knows that mastectomy is an option but would like to continue to pursue breast conservation, as long as we can control her disease with margin reexcision.  Operative Findings:       We excised a very generous superior margin, 1 cm in width.  We also excised a fairly generous inferior margin.  The dissection was taken all the way down to the pectoralis  major and minor muscles.  Laterally I went all the way to the axilla.  Wound closure, surprisingly, looks good with a modest volume loss  Procedure in Detail:          Following the induction of general LMA anesthesia the patient's right breast was prepped and draped in a sterile fashion.  Surgical timeout was performed.  Intravenous antibiotics were given.  0.5% Marcaine with epinephrine was used as a local infiltration anesthetic.      I reopened the lumpectomy incision in the upper outer quadrant.  I took the dissection down through the dermis and through the superficial subcutaneous tissue and then entered the lumpectomy cavity.  I cut out the old Vicryl sutures and the lumpectomy cavity was fairly easy to see.  I shaved a very generous superior margin and a fairly generous inferior margin as described above.  These were marked with ink and sent as separate specimens with ink on the new margin.  Hemostasis was excellent and achieved with  electrocautery.  The wound was irrigated.  I placed 5 new metal clips in the walls of the lumpectomy cavity.  Lumpectomy cavity was closed with interrupted 3-0 Vicryl sutures and the skin was closed with subcuticular 4-0 Monocryl and Dermabond.  Dry bandages and a breast binder were placed.  The  patient tolerated the procedure well and was taken to PACU in stable condition.  EBL 15 to 20 cc.  Counts correct.  Complications none.   Addendum: I logged onto the PMP aware website and reviewed her prescription medication history     Twan Harkin M. Dalbert Batman, M.D., FACS General and Minimally Invasive Surgery Breast and Colorectal Surgery  06/10/2019 3:42 PM

## 2019-06-10 NOTE — Anesthesia Postprocedure Evaluation (Signed)
Anesthesia Post Note  Patient: [name]  Procedure(s) Performed: RE-EXCISION OF RIGHT BREAST LUMPECTOMY MARGINS (Right Breast)     Patient location during evaluation: PACU Anesthesia Type: General Level of consciousness: awake and alert Pain management: pain level controlled Vital Signs Assessment: post-procedure vital signs reviewed and stable Respiratory status: spontaneous breathing, nonlabored ventilation, respiratory function stable and patient connected to nasal cannula oxygen Cardiovascular status: blood pressure returned to baseline and stable Postop Assessment: no apparent nausea or vomiting Anesthetic complications: no    Last Vitals:  Vitals:   06/10/19 1555 06/10/19 1610  BP: 132/84 134/87  Pulse: 70 63  Resp: 14 14  Temp: 36.5 C   SpO2: 95% 96%    Last Pain:  Vitals:   06/10/19 1610  TempSrc:   PainSc: 9                  Diannie Willner DAVID

## 2019-06-10 NOTE — Discharge Instructions (Signed)
General Anesthesia, Adult, Care After °This sheet gives you information about how to care for yourself after your procedure. Your health care provider may also give you more specific instructions. If you have problems or questions, contact your health care provider. °What can I expect after the procedure? °After the procedure, the following side effects are common: °· Pain or discomfort at the IV site. °· Nausea. °· Vomiting. °· Sore throat. °· Trouble concentrating. °· Feeling cold or chills. °· Weak or tired. °· Sleepiness and fatigue. °· Soreness and body aches. These side effects can affect parts of the body that were not involved in surgery. °Follow these instructions at home: ° °For at least 24 hours after the procedure: °· Have a responsible adult stay with you. It is important to have someone help care for you until you are awake and alert. °· Rest as needed. °· Do not: °? Participate in activities in which you could fall or become injured. °? Drive. °? Use heavy machinery. °? Drink alcohol. °? Take sleeping pills or medicines that cause drowsiness. °? Make important decisions or sign legal documents. °? Take care of children on your own. °Eating and drinking °· Follow any instructions from your health care provider about eating or drinking restrictions. °· When you feel hungry, start by eating small amounts of foods that are soft and easy to digest (bland), such as toast. Gradually return to your regular diet. °· Drink enough fluid to keep your urine pale yellow. °· If you vomit, rehydrate by drinking water, juice, or clear broth. °General instructions °· If you have sleep apnea, surgery and certain medicines can increase your risk for breathing problems. Follow instructions from your health care provider about wearing your sleep device: °? Anytime you are sleeping, including during daytime naps. °? While taking prescription pain medicines, sleeping medicines, or medicines that make you drowsy. °· Return to  your normal activities as told by your health care provider. Ask your health care provider what activities are safe for you. °· Take over-the-counter and prescription medicines only as told by your health care provider. °· If you smoke, do not smoke without supervision. °· Keep all follow-up visits as told by your health care provider. This is important. °Contact a health care provider if: °· You have nausea or vomiting that does not get better with medicine. °· You cannot eat or drink without vomiting. °· You have pain that does not get better with medicine. °· You are unable to pass urine. °· You develop a skin rash. °· You have a fever. °· You have redness around your IV site that gets worse. °Get help right away if: °· You have difficulty breathing. °· You have chest pain. °· You have blood in your urine or stool, or you vomit blood. °Summary °· After the procedure, it is common to have a sore throat or nausea. It is also common to feel tired. °· Have a responsible adult stay with you for the first 24 hours after general anesthesia. It is important to have someone help care for you until you are awake and alert. °· When you feel hungry, start by eating small amounts of foods that are soft and easy to digest (bland), such as toast. Gradually return to your regular diet. °· Drink enough fluid to keep your urine pale yellow. °· Return to your normal activities as told by your health care provider. Ask your health care provider what activities are safe for you. °This information is not   intended to replace advice given to you by your health care provider. Make sure you discuss any questions you have with your health care provider. Document Released: 11/05/2000 Document Revised: 08/02/2017 Document Reviewed: 03/15/2017 Elsevier Patient Education  2020 [place] Office Phone Number [phone]  BREAST BIOPSY/ PARTIAL MASTECTOMY: POST OP INSTRUCTIONS  Always review your  discharge instruction sheet given to you by the facility where your surgery was performed.  IF YOU HAVE DISABILITY OR FAMILY LEAVE FORMS, YOU MUST BRING THEM TO THE OFFICE FOR PROCESSING.  DO NOT GIVE THEM TO YOUR DOCTOR.  1. A prescription for pain medication may be given to you upon discharge.  Take your pain medication as prescribed, if needed.  If narcotic pain medicine is not needed, then you may take acetaminophen (Tylenol) or ibuprofen (Advil) as needed. 2. Take your usually prescribed medications unless otherwise directed 3. If you need a refill on your pain medication, please contact your pharmacy.  They will contact our office to request authorization.  Prescriptions will not be filled after 5pm or on week-ends. 4. You should eat very light the first 24 hours after surgery, such as soup, crackers, pudding, etc.  Resume your normal diet the day after surgery. 5. Most patients will experience some swelling and bruising in the breast.  Ice packs and a good support bra will help.  Swelling and bruising can take several days to resolve.  6. It is common to experience some constipation if taking pain medication after surgery.  Increasing fluid intake and taking a stool softener will usually help or prevent this problem from occurring.  A mild laxative (Milk of Magnesia or Miralax) should be taken according to package directions if there are no bowel movements after 48 hours. 7. Unless discharge instructions indicate otherwise, you may remove your bandages 24-48 hours after surgery, and you may shower at that time.  You may have steri-strips (small skin tapes) in place directly over the incision.  These strips should be left on the skin for 7-10 days.  If your surgeon used skin glue on the incision, you may shower in 24 hours.  The glue will flake off over the next 2-3 weeks.  Any sutures or staples will be removed at the office during your follow-up visit. 8. ACTIVITIES:  You may resume regular daily  activities (gradually increasing) beginning the next day.  Wearing a good support bra or sports bra minimizes pain and swelling.  You may have sexual intercourse when it is comfortable. a. You may drive when you no longer are taking prescription pain medication, you can comfortably wear a seatbelt, and you can safely maneuver your car and apply brakes. b. RETURN TO WORK:  ______________________________________________________________________________________ 9. You should see your doctor in the office for a follow-up appointment approximately two weeks after your surgery.  Your doctors nurse will typically make your follow-up appointment when she calls you with your pathology report.  Expect your pathology report 2-3 business days after your surgery.  You may call to check if you do not hear from [place] after three days. 10. OTHER INSTRUCTIONS: _______________________________________________________________________________________________ _____________________________________________________________________________________________________________________________________ _____________________________________________________________________________________________________________________________________ _____________________________________________________________________________________________________________________________________  WHEN TO CALL YOUR DOCTOR: 1. Fever over 101.0 2. Nausea and/or vomiting. 3. Extreme swelling or bruising. 4. Continued bleeding from incision. 5. Increased pain, redness, or drainage from the incision.  The clinic staff is available to answer your questions during regular business hours.  Please dont hesitate to call and ask to speak to one of the nurses  for clinical concerns.  If you have a medical emergency, go to the nearest emergency room or call 911.  A surgeon from [facility] is always on call at the hospital.  For further questions, please visit  [email or web address]            Managing Your Pain After Surgery Without Opioids    Thank you for participating in our program to help patients manage their pain after surgery without opioids. This is part of our effort to provide you with the best care possible, without exposing you or your family to the risk that opioids pose.  What pain can I expect after surgery? You can expect to have some pain after surgery. This is normal. The pain is typically worse the day after surgery, and quickly begins to get better. Many studies have found that many patients are able to manage their pain after surgery with Over-the-Counter (OTC) medications such as Tylenol and Motrin. If you have a condition that does not allow you to take Tylenol or Motrin, notify your surgical team.  How will I manage my pain? The best strategy for controlling your pain after surgery is around the clock pain control with Tylenol (acetaminophen) and Motrin (ibuprofen or Advil). Alternating these medications with each other allows you to maximize your pain control. In addition to Tylenol and Motrin, you can use heating pads or ice packs on your incisions to help reduce your pain.  How will I alternate your regular strength over-the-counter pain medication? You will take a dose of pain medication every three hours. ; Start by taking 650 mg of Tylenol (2 pills of 325 mg) ; 3 hours later take 600 mg of Motrin (3 pills of 200 mg) ; 3 hours after taking the Motrin take 650 mg of Tylenol ; 3 hours after that take 600 mg of Motrin.   - 1 -  See example - if your first dose of Tylenol is at 12:00 PM   12:00 PM Tylenol 650 mg (2 pills of 325 mg)  3:00 PM Motrin 600 mg (3 pills of 200 mg)  6:00 PM Tylenol 650 mg (2 pills of 325 mg)  9:00 PM Motrin 600 mg (3 pills of 200 mg)  Continue alternating every 3 hours   We recommend that you follow this schedule around-the-clock for at least 3 days after surgery,  or until you feel that it is no longer needed. Use the table on the last page of this handout to keep track of the medications you are taking. Important: Do not take more than 3000mg  of Tylenol or 3200mg  of Motrin in a 24-hour period. Do not take ibuprofen/Motrin if you have a history of bleeding stomach ulcers, severe kidney disease, &/or actively taking a blood thinner  What if I still have pain? If you have pain that is not controlled with the over-the-counter pain medications (Tylenol and Motrin or Advil) you might have what we call breakthrough pain. You will receive a prescription for a small amount of an opioid pain medication such as Oxycodone, Tramadol, or Tylenol with Codeine. Use these opioid pills in the first 24 hours after surgery if you have breakthrough pain. Do not take more than 1 pill every 4-6 hours.  If you still have uncontrolled pain after using all opioid pills, don't hesitate to call our staff using the number provided. We will help make sure you are managing your pain in the best way possible, and if necessary, we can provide a  prescription for additional pain medication.   Day 1    Time  Name of Medication Number of pills taken  Amount of Acetaminophen  Pain Level   Comments  AM PM       AM PM       AM PM       AM PM       AM PM       AM PM       AM PM       AM PM       Total Daily amount of Acetaminophen Do not take more than  3,000 mg per day      Day 2    Time  Name of Medication Number of pills taken  Amount of Acetaminophen  Pain Level   Comments  AM PM       AM PM       AM PM       AM PM       AM PM       AM PM       AM PM       AM PM       Total Daily amount of Acetaminophen Do not take more than  3,000 mg per day      Day 3    Time  Name of Medication Number of pills taken  Amount of Acetaminophen  Pain Level   Comments  AM PM       AM PM       AM PM       AM PM          AM PM       AM PM       AM PM       AM  PM       Total Daily amount of Acetaminophen Do not take more than  3,000 mg per day      Day 4    Time  Name of Medication Number of pills taken  Amount of Acetaminophen  Pain Level   Comments  AM PM       AM PM       AM PM       AM PM       AM PM       AM PM       AM PM       AM PM       Total Daily amount of Acetaminophen Do not take more than  3,000 mg per day      Day 5    Time  Name of Medication Number of pills taken  Amount of Acetaminophen  Pain Level   Comments  AM PM       AM PM       AM PM       AM PM       AM PM       AM PM       AM PM       AM PM       Total Daily amount of Acetaminophen Do not take more than  3,000 mg per day       Day 6    Time  Name of Medication Number of pills taken  Amount of Acetaminophen  Pain Level  Comments  AM PM       AM PM       AM PM  AM PM       AM PM       AM PM       AM PM       AM PM       Total Daily amount of Acetaminophen Do not take more than  3,000 mg per day      Day 7    Time  Name of Medication Number of pills taken  Amount of Acetaminophen  Pain Level   Comments  AM PM       AM PM       AM PM       AM PM       AM PM       AM PM       AM PM       AM PM       Total Daily amount of Acetaminophen Do not take more than  3,000 mg per day        For additional information about how and where to safely dispose of unused opioid medications - [employer]  Disclaimer: This document contains information and/or instructional materials adapted from [place] for the typical patient with your condition. It does not replace medical advice from your health care provider because your experience may differ from that of the typical patient. Talk to your health care provider if you have any questions about this document, your condition or your treatment plan. Adapted from [place]

## 2019-06-10 NOTE — Transfer of Care (Signed)
Immediate Anesthesia Transfer of Care Note  Patient: Megan Khan  Procedure(s) Performed: RE-EXCISION OF RIGHT BREAST LUMPECTOMY MARGINS (Right Breast)  Patient Location: PACU  Anesthesia Type:General  Level of Consciousness: awake, alert  and oriented  Airway & Oxygen Therapy: Patient Spontanous Breathing  Post-op Assessment: Report given to RN, Post -op Vital signs reviewed and stable and Patient moving all extremities  Post vital signs: Reviewed and stable  Last Vitals:  Vitals Value Taken Time  BP 132/84 06/10/19 1555  Temp 36.5 C 06/10/19 1555  Pulse 67 06/10/19 1559  Resp 15 06/10/19 1559  SpO2 93 % 06/10/19 1559  Vitals shown include unvalidated device data.  Last Pain:  Vitals:   06/10/19 1310  TempSrc:   PainSc: 0-No pain      Patients Stated Pain Goal: 0 ([id number])  Complications: No apparent anesthesia complications

## 2019-06-10 NOTE — Interval H&P Note (Signed)
History and Physical Interval Note:  06/10/2019 2:19 PM  Megan Khan  has presented today for surgery, with the diagnosis of RIGHT BREAST CANCER.  The various methods of treatment have been discussed with the patient and family. After consideration of risks, benefits and other options for treatment, the patient has consented to  Procedure(s): RE-EXCISION OF RIGHT BREAST LUMPECTOMY MARGINS (Right) as a surgical intervention.  The patient's history has been reviewed, patient examined, no change in status, stable for surgery.  I have reviewed the patient's chart and labs.  Questions were answered to the patient's satisfaction.     [name]

## 2019-06-10 NOTE — Anesthesia Procedure Notes (Signed)
Procedure Name: LMA Insertion Date/Time: 06/10/2019 3:06 PM Performed by: Amadeo Garnet, CRNA Pre-anesthesia Checklist: Patient identified, Emergency Drugs available, Suction available, Patient being monitored and Timeout performed Patient Re-evaluated:Patient Re-evaluated prior to induction Oxygen Delivery Method: Circle system utilized Preoxygenation: Pre-oxygenation with 100% oxygen Induction Type: IV induction LMA: LMA inserted LMA Size: 4.0 Number of attempts: 1 Placement Confirmation: ETT inserted through vocal cords under direct vision,  positive ETCO2 and breath sounds checked- equal and bilateral Dental Injury: Teeth and Oropharynx as per pre-operative assessment

## 2019-06-11 ENCOUNTER — Encounter (HOSPITAL_COMMUNITY): Payer: Self-pay | Admitting: General Surgery

## 2019-06-11 NOTE — Progress Notes (Signed)
50 mcg of fentanyl wasted after patient discharged with Ileene Rubens, RN.

## 2019-06-12 LAB — SURGICAL PATHOLOGY

## 2019-06-18 ENCOUNTER — Other Ambulatory Visit: Payer: Medicare Other

## 2019-06-18 ENCOUNTER — Ambulatory Visit: Payer: Medicare Other | Admitting: Oncology

## 2019-06-24 ENCOUNTER — Other Ambulatory Visit: Payer: Self-pay

## 2019-06-24 ENCOUNTER — Ambulatory Visit (INDEPENDENT_AMBULATORY_CARE_PROVIDER_SITE_OTHER): Payer: Medicare Other

## 2019-06-24 DIAGNOSIS — Z23 Encounter for immunization: Secondary | ICD-10-CM

## 2019-06-24 NOTE — Progress Notes (Signed)
Patient presents in nurse clinic for flu vaccine. Vaccine given LD, site unremarkable.

## 2019-06-25 ENCOUNTER — Other Ambulatory Visit: Payer: Self-pay | Admitting: Adult Health

## 2019-06-25 DIAGNOSIS — C50411 Malignant neoplasm of upper-outer quadrant of right female breast: Secondary | ICD-10-CM

## 2019-06-25 NOTE — Progress Notes (Addendum)
[place]  [phone] [phone]    ID: Megan Khan DOB: [birth year]  MR#: [id number]  CBU#:[id number]  Patient Care Team: [name], DO as PCP - General [name], [name], MD as Consulting Physician (Oncology) Fanny Skates, MD as Consulting Physician (General Surgery) [name], MD as Consulting Physician (Radiation Oncology) [name], [name], DPM as Consulting Physician (Podiatry) [name], MD as Consulting Physician (Ophthalmology) [name], MD as Consulting Physician (Gastroenterology) Larey Dresser, MD as Consulting Physician (Cardiology) [name], RN as Oncology Nurse Navigator [name], RN as Oncology Nurse Navigator OTHER MD:   CHIEF COMPLAINT: Estrogen receptor negative breast cancer  CURRENT TREATMENT: Abraxane/Trastuzumab   HISTORY OF CURRENT ILLNESS: From the original intake note:  HYDEIA MCATEE had routine screening mammography on 02/24/2019 showing a possible abnormality in the right breast. She underwent unilateral right diagnostic mammography with tomography and right breast ultrasonography at [facility] on 03/04/2019 showing: Breast Density Category B. Additional imaging of the right breast was performed. There is a persistent asymmetry and distortion in the middle third of the upper-outer quadrant of the breast. There are no malignant type microcalcifications. On physical exam, I do not palpate a mass in the upper-outer quadrant of the right breast. Targeted ultrasound is performed, showing an irregular hypoechoic mass in the right breast 11 o'clock 7 cm from the nipple measuring 0.7 x 0.7 x 1.0 cm. Sonographic evaluation of the right axilla does not show any enlarged adenopathy.  Accordingly on 03/11/2019 she proceeded to biopsy of the right breast area in question. The pathology from this procedure showed ([id number]): invasive ductal carcinoma, grade II, 11 o'clock 7 cm from  the nipple. Prognostic indicators significant for: estrogen receptor, 0% negative and progesterone receptor, 0% negative. Proliferation marker Ki67 at 10%. HER2 positive (3+) by immunohistochemistry.  The patient's subsequent history is as detailed below.   INTERVAL HISTORY: Shemekia is here for follow up of her HER-2 positive breast cancer.  She is accompanied by her son [name].    Since her last visit she underwent a left breast lumpectomy on 05/29/2019 that showed a 0.7cm invasive ductal carcinoma.  5 lymph nodes were negative for involvement.  There was a positive margin for DCIS that was cleared with re excision on 06/10/2019.  A port was placed on 05/29/2019.   REVIEW OF SYSTEMS: Jayce is doing well today.  She is recovering from surgery.  Her initial surgery was delayed due to uncontrolled diabetes and an abnormal EKG.  She notes her blood sugars have been under better control, and she also saw Dr. Gwenlyn Found in 04/2019 who cleared her for surgery and determined the EKG abnormalities to be consistent with non specific changes.  Amye has some persistent soreness at her lumpectomy and lymph node biopsy site, however is feeling well otherwise.    Aylin denies any fever or chills.  She is without chest pain, palpitations, cough, shortness of breath, nausea, vomiting, bowel/bladder changes.  She does have longstanding peripheral neuropathy in her feet and legs.  This is from her diabetes and she takes Gabapentin for this.  She notes it is improving.  A detailed ROS Was otherwise non contributory.     PAST MEDICAL HISTORY: Past Medical History:  Diagnosis Date   Anemia    during pregnancy   Cancer ([place]) 2020   breast cancer on right   Diabetes mellitus    Type 2   Family history of breast cancer  Family history of throat cancer    Glaucoma    Glaucoma    Hx of colonic polyps    Hyperlipidemia    Hypertension    Legally blind    completely blind in left eye, low vision in  right    PAST SURGICAL HISTORY: Past Surgical History:  Procedure Laterality Date   BREAST LUMPECTOMY WITH RADIOACTIVE SEED AND SENTINEL LYMPH NODE BIOPSY Right 05/29/2019   Procedure: RIGHT BREAST LUMPECTOMY WITH RADIOACTIVE SEED AND RIGHT DEEP AXILLARY SENTINEL LYMPH NODE BIOPSY INJECT BLUE DYE;  Surgeon: Fanny Skates, MD;  Location: [facility];  Service: General;  Laterality: Right;   COLONOSCOPY  01/22/2012   Procedure: COLONOSCOPY;  Surgeon: [name], MD;  Location: [facility];  Service: Endoscopy;  Laterality: N/A;   COLONOSCOPY     COLONOSCOPY     PORTACATH PLACEMENT N/A 05/29/2019   Procedure: INSERTION PORT-A-CATH WITH ULTRASOUND;  Surgeon: Fanny Skates, MD;  Location: [facility];  Service: General;  Laterality: N/A;   RE-EXCISION OF BREAST LUMPECTOMY Right 06/10/2019   Procedure: RE-EXCISION OF RIGHT BREAST LUMPECTOMY MARGINS;  Surgeon: Fanny Skates, MD;  Location: [facility];  Service: General;  Laterality: Right;   TUBAL LIGATION       FAMILY HISTORY: Family History  Problem Relation Age of Onset   Breast cancer Sister 58       Her2 +   Breast cancer Cousin        maternal second cousin, Bilateral mastectomy, diagnosed in her 70s   Danissa's father died from natural causes at age 39. Patients' mother died from natural causes at age 23. The patient has 5 brothers and 3 sisters.  The patient's sister, [name], is my patient diagnosed with breast cancer at 77.  The cyst cancer was estrogen receptor positive, HER-2 negative.  She received no chemo.  Marixa's maternal 2nd cousin was diagnosed with breast cancer. Patient denies anyone in her family having ovarian, prostate, or pancreatic cancer.    GYNECOLOGIC HISTORY:  No LMP recorded. Patient is postmenopausal. Menarche: 63 years old Age at first live birth: 63 years old GX P: 3 LMP: 63 Contraceptive: no HRT: no  Hysterectomy?: no BSO?: no   SOCIAL HISTORY: (Current as of 03/30/2019) Azilee is disabled  secondary to her glaucoma.she is legally blind.  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 She is divorced. She lives with her son, [name] who is 54 and is disabled with cerebral palsy.  He does attend school.Her son [name] was killed (gunshot wound) age 75.Marland Kitchen Her son, [name], is 66 and works for this [employer]. Her daughter, [name], is 68 and is a Quarry manager. Airika has 7 grandchildren and 2 great-grandchildren.  She attends the [employer]: Not in place. She has verbally named her sister, [name], as her healthcare power of attorney.  At the 03/30/2019 visit she was given the appropriate documents to complete and notarize at her discretion  HEALTH MAINTENANCE: Social History   Tobacco Use   Smoking status: Former Smoker    Types: Cigarettes    Quit date: 09/23/1995    Years since quitting: 23.7   Smokeless tobacco: Never Used  Substance Use Topics   Alcohol use: No   Drug use: No    Colonoscopy: June 2013/again in  PAP: Per Dr. Criss Rosales  Bone density: Never  Allergies  Allergen Reactions   Sulfamethoxazole Rash    REACTION:  Questionable allergy ?    Current Outpatient Medications  Medication Sig Dispense Refill   amLODipine (NORVASC) 10 MG tablet TAKE 1 TABLET(10 MG) BY MOUTH DAILY (Patient taking differently: Take 10 mg by mouth daily. TAKE 1 TABLET(10 MG) BY MOUTH DAILY) 90 tablet 3   aspirin 81 MG EC tablet Take 81 mg by mouth daily.       bimatoprost (LUMIGAN) 0.01 % SOLN Place 2 drops into the left eye at bedtime.      blood glucose meter kit and supplies KIT Dispense based on patient and insurance preference. Use up to four times daily as directed. (FOR ICD-9 250.00, 250.01). 1 each 12   dorzolamide (TRUSOPT) 2 % ophthalmic solution Place 2 drops into the left eye 3 (three) times daily.      gabapentin (NEURONTIN) 100 MG capsule Take 2 capsules (200 mg total) by mouth 3 (three) times daily. 180  capsule 0   lidocaine-prilocaine (EMLA) cream Apply to affected area once 30 g 3   lisinopril-hydrochlorothiazide (ZESTORETIC) 20-25 MG tablet Take 1 tablet by mouth daily. 90 tablet 3   lovastatin (MEVACOR) 20 MG tablet TAKE 1 TABLET BY MOUTH EVERY NIGHT AT BEDTIME (Patient taking differently: Take 20 mg by mouth at bedtime. ) 90 tablet 3   metFORMIN (GLUCOPHAGE) 1000 MG tablet Take 1 tablet (1,000 mg total) by mouth 2 (two) times daily with a meal. 360 tablet 1   metoprolol succinate (TOPROL-XL) 50 MG 24 hr tablet TAKE 1 TABLET(50 MG) BY MOUTH DAILY (Patient taking differently: Take 50 mg by mouth daily. ) 90 tablet 0   prochlorperazine (COMPAZINE) 10 MG tablet Take 1 tablet (10 mg total) by mouth every 6 (six) hours as needed (Nausea or vomiting). 30 tablet 1   No current facility-administered medications for this visit.      OBJECTIVE:  Vitals:   06/26/19 0835  BP: 137/76  Pulse: 60  Resp: 18  Temp: 98.5 F (36.9 C)  SpO2: 100%     Body mass index is 29.17 kg/m.   Wt Readings from Last 3 Encounters:  06/26/19 156 lb 14.4 oz (71.2 kg)  06/10/19 135 lb (61.2 kg)  05/25/19 148 lb (67.1 kg)      ECOG FS:1 - Symptomatic but completely ambulatory GENERAL: Patient is a well appearing female in no acute distress HEENT:  Sclerae anicteric.  Oropharynx clear and moist. No ulcerations or evidence of oropharyngeal candidiasis. Neck is supple.  NODES:  No cervical, supraclavicular, or axillary lymphadenopathy palpated.  BREAST EXAM:  S/p right lumpectomy and axillary biopsy, healing well, very mild erythema present, no sign of infection LUNGS:  Clear to auscultation bilaterally.  No wheezes or rhonchi. HEART:  Regular rate and rhythm. No murmur appreciated. ABDOMEN:  Soft, nontender.  Positive, normoactive bowel sounds. No organomegaly palpated. MSK:  No focal spinal tenderness to palpation. Full range of motion bilaterally in the upper extremities. EXTREMITIES:  No peripheral  edema.   SKIN:  Clear with no obvious rashes or skin changes. No nail dyscrasia. NEURO:  Nonfocal. Well oriented.  Appropriate affect.     LAB RESULTS:  CMP     Component Value Date/Time   NA 141 06/26/2019 0805   NA 142 05/27/2018 1533   K 3.5 06/26/2019 0805   CL 103 06/26/2019 0805   CO2 27 06/26/2019 0805   GLUCOSE 205 (H) 06/26/2019 0805   BUN 9 06/26/2019 0805   BUN 19 05/27/2018 1533   CREATININE 0.81 06/26/2019 0805   CREATININE  0.79 01/30/2016 1130   CALCIUM 9.3 06/26/2019 0805   PROT 7.0 06/26/2019 0805   ALBUMIN 4.0 06/26/2019 0805   AST 10 (L) 06/26/2019 0805   ALT 18 06/26/2019 0805   ALKPHOS 78 06/26/2019 0805   BILITOT 0.4 06/26/2019 0805   GFRNONAA >60 06/26/2019 0805   GFRNONAA 82 01/30/2016 1130   GFRAA >60 06/26/2019 0805   GFRAA >89 01/30/2016 1130    No results found for: TOTALPROTELP, ALBUMINELP, A1GS, A2GS, BETS, BETA2SER, GAMS, MSPIKE, SPEI  No results found for: KPAFRELGTCHN, LAMBDASER, KAPLAMBRATIO  Lab Results  Component Value Date   WBC 4.9 06/26/2019   NEUTROABS 2.2 06/26/2019   HGB 13.0 06/26/2019   HCT 41.8 06/26/2019   MCV 84.6 06/26/2019   PLT 297 06/26/2019    '@LASTCHEMISTRY'$ @  No results found for: LABCA2  No components found for: QZESPQ330  No results for input(s): INR in the last 168 hours.  No results found for: LABCA2  No results found for: QTM226  No results found for: JFH545  No results found for: GYB638  No results found for: CA2729  No components found for: HGQUANT  No results found for: CEA1 / No results found for: CEA1   No results found for: AFPTUMOR  No results found for: CHROMOGRNA  No results found for: PSA1  Appointment on 06/26/2019  Component Date Value Ref Range Status   Sodium 06/26/2019 141  135 - 145 mmol/L Final   Potassium 06/26/2019 3.5  3.5 - 5.1 mmol/L Final   Chloride 06/26/2019 103  98 - 111 mmol/L Final   CO2 06/26/2019 27  22 - 32 mmol/L Final   Glucose, Bld  06/26/2019 205* 70 - 99 mg/dL Final   BUN 06/26/2019 9  8 - 23 mg/dL Final   Creatinine 06/26/2019 0.81  0.44 - 1.00 mg/dL Final   Calcium 06/26/2019 9.3  8.9 - 10.3 mg/dL Final   Total Protein 06/26/2019 7.0  6.5 - 8.1 g/dL Final   Albumin 06/26/2019 4.0  3.5 - 5.0 g/dL Final   AST 06/26/2019 10* 15 - 41 U/L Final   ALT 06/26/2019 18  0 - 44 U/L Final   Alkaline Phosphatase 06/26/2019 78  38 - 126 U/L Final   Total Bilirubin 06/26/2019 0.4  0.3 - 1.2 mg/dL Final   GFR, Est Non Af Am 06/26/2019 >60  >60 mL/min Final   GFR, Est AFR Am 06/26/2019 >60  >60 mL/min Final   Anion gap 06/26/2019 11  5 - 15 Final   Performed at [facility], [place] [address], [place], [place] [address]   WBC Count 06/26/2019 4.9  4.0 - 10.5 K/uL Final   RBC 06/26/2019 4.94  3.87 - 5.11 MIL/uL Final   Hemoglobin 06/26/2019 13.0  12.0 - 15.0 g/dL Final   HCT 06/26/2019 41.8  36.0 - 46.0 % Final   MCV 06/26/2019 84.6  80.0 - 100.0 fL Final   MCH 06/26/2019 26.3  26.0 - 34.0 pg Final   MCHC 06/26/2019 31.1  30.0 - 36.0 g/dL Final   RDW 06/26/2019 13.5  11.5 - 15.5 % Final   Platelet Count 06/26/2019 297  150 - 400 K/uL Final   nRBC 06/26/2019 0.0  0.0 - 0.2 % Final   Neutrophils Relative % 06/26/2019 46  % Final   Neutro Abs 06/26/2019 2.2  1.7 - 7.7 K/uL Final   Lymphocytes Relative 06/26/2019 37  % Final   Lymphs Abs 06/26/2019 1.8  0.7 - 4.0 K/uL Final  Monocytes Relative 06/26/2019 9  % Final   Monocytes Absolute 06/26/2019 0.5  0.1 - 1.0 K/uL Final   Eosinophils Relative 06/26/2019 7  % Final   Eosinophils Absolute 06/26/2019 0.4  0.0 - 0.5 K/uL Final   Basophils Relative 06/26/2019 1  % Final   Basophils Absolute 06/26/2019 0.1  0.0 - 0.1 K/uL Final   Immature Granulocytes 06/26/2019 0  % Final   Abs Immature Granulocytes 06/26/2019 0.01  0.00 - 0.07 K/uL Final   Performed at [facility], [place] [address],  [place], [place] [address]    (this displays the last labs from the last 3 days)  No results found for: TOTALPROTELP, ALBUMINELP, A1GS, A2GS, BETS, BETA2SER, GAMS, MSPIKE, SPEI (this displays SPEP labs)  No results found for: KPAFRELGTCHN, LAMBDASER, KAPLAMBRATIO (kappa/lambda light chains)  No results found for: HGBA, HGBA2QUANT, HGBFQUANT, HGBSQUAN (Hemoglobinopathy evaluation)   No results found for: LDH  No results found for: IRON, TIBC, IRONPCTSAT (Iron and TIBC)  No results found for: FERRITIN  Urinalysis No results found for: COLORURINE, APPEARANCEUR, LABSPEC, PHURINE, GLUCOSEU, HGBUR, BILIRUBINUR, KETONESUR, PROTEINUR, UROBILINOGEN, NITRITE, LEUKOCYTESUR   STUDIES:  Nm Sentinel Node Inj-no Rpt (breast)  Result Date: 05/29/2019 Sulfur colloid was injected by the nuclear medicine technologist for melanoma sentinel node.   Mm Breast Surgical Specimen  Result Date: 05/29/2019 CLINICAL DATA:  Status post lumpectomy of the right breast. EXAM: SPECIMEN RADIOGRAPH OF THE RIGHT BREAST COMPARISON:  Previous exam(s). FINDINGS: Status post excision of the right breast. The radioactive seed and biopsy marker clip are present, completely intact, and were marked for pathology. IMPRESSION: Specimen radiograph of the right breast. Electronically Signed   By: Lillia Mountain M.D.   On: 05/29/2019 08:54   Dg Chest Port 1 View  Result Date: 05/29/2019 CLINICAL DATA:  Postoperative, port catheter EXAM: PORTABLE CHEST 1 VIEW COMPARISON:  None. FINDINGS: Right chest port catheter is placed with tip near the superior cavoatrial junction. Cardiomegaly with probable left retrocardiac atelectasis. Surgical clips and subcutaneous emphysema about the right breast and axilla. IMPRESSION: 1. Right chest port catheter is placed with tip near the superior cavoatrial junction. 2.  Cardiomegaly with probable left retrocardiac atelectasis. 3. Surgical clips and subcutaneous emphysema about the right breast and  axilla. Electronically Signed   By: Eddie Candle M.D.   On: 05/29/2019 11:00   Dg Fluoro Guide Cv Line-no Report  Result Date: 05/29/2019 Fluoroscopy was utilized by the requesting physician.  No radiographic interpretation.   Mm Rt Radioactive Seed Loc Mammo Guide  Result Date: 05/28/2019 CLINICAL DATA:  Radioactive seed placement prior to surgery EXAM: MAMMOGRAPHIC GUIDED RADIOACTIVE SEED LOCALIZATION OF THE RIGHT BREAST COMPARISON:  Previous exam(s). FINDINGS: Patient presents for radioactive seed localization prior to surgery. I met with the patient and we discussed the procedure of seed localization including benefits and alternatives. We discussed the high likelihood of a successful procedure. We discussed the risks of the procedure including infection, bleeding, tissue injury and further surgery. We discussed the low dose of radioactivity involved in the procedure. Informed, written consent was given. The usual time-out protocol was performed immediately prior to the procedure. Using mammographic guidance, sterile technique, 1% lidocaine and an I-125 radioactive seed, the previously placed biopsy clip was localized using a lateral approach. The follow-up mammogram images confirm the seed in the expected location and were marked for the surgeon. Follow-up survey of the patient confirms presence of the radioactive seed. Order number of I-125 seed:  [id number]. Total  activity:  4.166 millicuries reference Date: May 20, 2019 The patient tolerated the procedure well and was released from the [facility]. She was given instructions regarding seed removal. IMPRESSION: Radioactive seed localization right breast. No apparent complications. Electronically Signed   By: Dorise Bullion III M.D   On: 05/28/2019 13:26     ELIGIBLE FOR AVAILABLE RESEARCH PROTOCOL: UPBEAT   ASSESSMENT: 63 y.o. [place], [place] woman status post right breast upper outer quadrant biopsy 03/11/2019 for a clinical t1b N0,  stage IA invasive ductal carcinoma, grade 2, estrogen and progesterone receptor negative, HER-2 amplified, with an MIB-1 of 10%.  (1) genetics testing 03/30/2019: declined by patient  (2) Right breast lumpectomy on 05/29/2019: pT1b pN0, stage IA invasive ductal carciinoma, grade 2,   (a) a total of 5 lymph nodes were negative for carcinoma  (b) superior margin positive, cleared with addition surgery on 06/10/2019   (c) surgery delayed due to abnormal EKG, patient cleared by Dr. Gwenlyn Found on 04/21/2019  (3) adjuvant chemotherapy to consist of Abraxane weekly x12 with Trastuzumab, starting 07/01/2019  (a) Abraxane given due to diabetes, and inability to tolerate steroid premedication required with Paclitaxel.  (4) trastuzumab to be continued to complete 1 year  (a) baseline echo 04/06/2019 shows an ejection fraction greater than 65%  (5) adjuvant radiation to follow.  PLAN: Jerusalen met with Dr. Jana Hakim and myself.  We reviewed her pathology results with her and her favorable prognosis due to the fact that she has early stage breast cancer.  We reviewed with her however, that due to the type of breast cancer she has, HER-2 positive, that it is more aggressive, and adjuvant chemotherapy and immunotherapy is recommended.    She is going to receive chemotherapy with Abraxane and Trastuzumab.  She has undergone echocardiogram that showed an EF of greater than 65% in 03/2019.  We reviewed the risk and benefits of each treatment with her in detail and she verbalizes understanding and is willing to proceed.  We will have to monitor her very closely for neuropathy since she already has some diabetic neuropathy at baseline.  She knows that she will need an echocardiogram every 3 months while receiving Trastuzumab therapy.    Debbi will return in 1 week for labs, f/u and to start chemotherapy.  She has already been to chemo education.  She was recommended to continue with the appropriate pandemic precautions. She  knows to call for any questions that may arise between now and her next appointment.  We are happy to see her sooner if needed.  Wilber Bihari, NP  06/26/19 10:16 AM Medical Oncology and Hematology [facility] [address] [place], [place] [address] Tel. [phone]    Fax. [phone]    ADDENDUM: Jireh did well with her surgery, which showed a very small grade 2 tumor, which however was HER-2 positive and estrogen and progesterone receptor negative.  She understands that HER-2 positive tumors are at higher risk of distant recurrence.  She needs systemic therapy.  Standard of care in this setting is Taxol and trastuzumab but in this case, with uncontrolled diabetes, it would be impossible to safely give her paclitaxel which requires significant steroid premedication.  We are going to use Abraxane, which is the equivalent in terms of chemo effectiveness, if not slightly more effective, but which does not require steroid premedication.  Today we discussed some of the possible toxicities side effects and complications of these agents.  The patient is willing to start and  we have entered the orders for next week  We will see her after the first cycle to make sure she is tolerating it well.  She knows to call for any other issue that may develop before then.  I personally saw this patient and performed a substantive portion of this encounter with the listed APP documented above.   Chauncey Cruel, MD Medical Oncology and Hematology [facility] [address] [place], [place] [address] Tel. [phone]    Fax. [phone]

## 2019-06-26 ENCOUNTER — Encounter: Payer: Self-pay | Admitting: Adult Health

## 2019-06-26 ENCOUNTER — Inpatient Hospital Stay (HOSPITAL_BASED_OUTPATIENT_CLINIC_OR_DEPARTMENT_OTHER): Payer: Medicare Other | Admitting: Adult Health

## 2019-06-26 ENCOUNTER — Telehealth: Payer: Self-pay | Admitting: Adult Health

## 2019-06-26 ENCOUNTER — Inpatient Hospital Stay: Payer: Medicare Other | Attending: Oncology

## 2019-06-26 ENCOUNTER — Other Ambulatory Visit: Payer: Self-pay

## 2019-06-26 VITALS — BP 137/76 | HR 60 | Temp 98.5°F | Resp 18 | Ht 61.5 in | Wt 156.9 lb

## 2019-06-26 DIAGNOSIS — Z8249 Family history of ischemic heart disease and other diseases of the circulatory system: Secondary | ICD-10-CM | POA: Diagnosis not present

## 2019-06-26 DIAGNOSIS — Z171 Estrogen receptor negative status [ER-]: Secondary | ICD-10-CM | POA: Insufficient documentation

## 2019-06-26 DIAGNOSIS — Z5112 Encounter for antineoplastic immunotherapy: Secondary | ICD-10-CM | POA: Insufficient documentation

## 2019-06-26 DIAGNOSIS — Z7984 Long term (current) use of oral hypoglycemic drugs: Secondary | ICD-10-CM | POA: Diagnosis not present

## 2019-06-26 DIAGNOSIS — Z7982 Long term (current) use of aspirin: Secondary | ICD-10-CM | POA: Diagnosis not present

## 2019-06-26 DIAGNOSIS — Z803 Family history of malignant neoplasm of breast: Secondary | ICD-10-CM | POA: Insufficient documentation

## 2019-06-26 DIAGNOSIS — C50411 Malignant neoplasm of upper-outer quadrant of right female breast: Secondary | ICD-10-CM | POA: Insufficient documentation

## 2019-06-26 DIAGNOSIS — E119 Type 2 diabetes mellitus without complications: Secondary | ICD-10-CM | POA: Diagnosis not present

## 2019-06-26 DIAGNOSIS — Z801 Family history of malignant neoplasm of trachea, bronchus and lung: Secondary | ICD-10-CM | POA: Diagnosis not present

## 2019-06-26 DIAGNOSIS — Z5111 Encounter for antineoplastic chemotherapy: Secondary | ICD-10-CM | POA: Insufficient documentation

## 2019-06-26 DIAGNOSIS — I10 Essential (primary) hypertension: Secondary | ICD-10-CM | POA: Diagnosis not present

## 2019-06-26 DIAGNOSIS — Z79899 Other long term (current) drug therapy: Secondary | ICD-10-CM | POA: Diagnosis not present

## 2019-06-26 LAB — CBC WITH DIFFERENTIAL (CANCER CENTER ONLY)
Abs Immature Granulocytes: 0.01 10*3/uL (ref 0.00–0.07)
Basophils Absolute: 0.1 10*3/uL (ref 0.0–0.1)
Basophils Relative: 1 %
Eosinophils Absolute: 0.4 10*3/uL (ref 0.0–0.5)
Eosinophils Relative: 7 %
HCT: 41.8 % (ref 36.0–46.0)
Hemoglobin: 13 g/dL (ref 12.0–15.0)
Immature Granulocytes: 0 %
Lymphocytes Relative: 37 %
Lymphs Abs: 1.8 10*3/uL (ref 0.7–4.0)
MCH: 26.3 pg (ref 26.0–34.0)
MCHC: 31.1 g/dL (ref 30.0–36.0)
MCV: 84.6 fL (ref 80.0–100.0)
Monocytes Absolute: 0.5 10*3/uL (ref 0.1–1.0)
Monocytes Relative: 9 %
Neutro Abs: 2.2 10*3/uL (ref 1.7–7.7)
Neutrophils Relative %: 46 %
Platelet Count: 297 10*3/uL (ref 150–400)
RBC: 4.94 MIL/uL (ref 3.87–5.11)
RDW: 13.5 % (ref 11.5–15.5)
WBC Count: 4.9 10*3/uL (ref 4.0–10.5)
nRBC: 0 % (ref 0.0–0.2)

## 2019-06-26 LAB — CMP (CANCER CENTER ONLY)
ALT: 18 U/L (ref 0–44)
AST: 10 U/L — ABNORMAL LOW (ref 15–41)
Albumin: 4 g/dL (ref 3.5–5.0)
Alkaline Phosphatase: 78 U/L (ref 38–126)
Anion gap: 11 (ref 5–15)
BUN: 9 mg/dL (ref 8–23)
CO2: 27 mmol/L (ref 22–32)
Calcium: 9.3 mg/dL (ref 8.9–10.3)
Chloride: 103 mmol/L (ref 98–111)
Creatinine: 0.81 mg/dL (ref 0.44–1.00)
GFR, Est AFR Am: >60 mL/min (ref >60)
GFR, Estimated: >60 mL/min (ref >60)
Glucose, Bld: 205 mg/dL — ABNORMAL HIGH (ref 70–99)
Potassium: 3.5 mmol/L (ref 3.5–5.1)
Sodium: 141 mmol/L (ref 135–145)
Total Bilirubin: 0.4 mg/dL (ref 0.3–1.2)
Total Protein: 7 g/dL (ref 6.5–8.1)

## 2019-06-26 MED ORDER — PROCHLORPERAZINE MALEATE 10 MG PO TABS: 10.0000 mg | ORAL_TABLET | Freq: Four times a day (QID) | ORAL | 1 refills | Status: DC | PRN

## 2019-06-26 MED ORDER — LIDOCAINE-PRILOCAINE 2.5-2.5 % EX CREA: TOPICAL_CREAM | CUTANEOUS | 3 refills | Status: DC

## 2019-06-26 NOTE — Progress Notes (Signed)
Gave my card to [place] [facility] to give to patient and wrote information on back regarding one-time $1000 [employer].

## 2019-06-26 NOTE — Telephone Encounter (Signed)
Gave avs and calendar ° °

## 2019-06-29 ENCOUNTER — Telehealth: Payer: Self-pay | Admitting: Adult Health

## 2019-06-29 ENCOUNTER — Other Ambulatory Visit: Payer: Self-pay | Admitting: Adult Health

## 2019-06-29 NOTE — Telephone Encounter (Signed)
I talk with patient regarding schedule  

## 2019-07-01 ENCOUNTER — Ambulatory Visit: Payer: Medicare Other | Admitting: Adult Health

## 2019-07-01 ENCOUNTER — Other Ambulatory Visit: Payer: Medicare Other

## 2019-07-01 ENCOUNTER — Ambulatory Visit: Payer: Medicare Other

## 2019-07-07 ENCOUNTER — Other Ambulatory Visit: Payer: Self-pay | Admitting: *Deleted

## 2019-07-07 DIAGNOSIS — G8929 Other chronic pain: Secondary | ICD-10-CM

## 2019-07-07 MED ORDER — GABAPENTIN 100 MG PO CAPS: 200.0000 mg | ORAL_CAPSULE | Freq: Three times a day (TID) | ORAL | 0 refills | Status: DC

## 2019-07-08 ENCOUNTER — Other Ambulatory Visit: Payer: Self-pay | Admitting: *Deleted

## 2019-07-08 DIAGNOSIS — Z171 Estrogen receptor negative status [ER-]: Secondary | ICD-10-CM

## 2019-07-08 DIAGNOSIS — C50411 Malignant neoplasm of upper-outer quadrant of right female breast: Secondary | ICD-10-CM

## 2019-07-10 ENCOUNTER — Encounter: Payer: Self-pay | Admitting: Adult Health

## 2019-07-10 ENCOUNTER — Inpatient Hospital Stay (HOSPITAL_BASED_OUTPATIENT_CLINIC_OR_DEPARTMENT_OTHER): Payer: Medicare Other | Admitting: Adult Health

## 2019-07-10 ENCOUNTER — Inpatient Hospital Stay: Payer: Medicare Other

## 2019-07-10 ENCOUNTER — Other Ambulatory Visit: Payer: Self-pay | Admitting: Adult Health

## 2019-07-10 ENCOUNTER — Other Ambulatory Visit: Payer: Self-pay

## 2019-07-10 VITALS — BP 160/83 | HR 74 | Temp 98.0°F | Resp 17 | Ht 61.5 in | Wt 158.4 lb

## 2019-07-10 DIAGNOSIS — Z171 Estrogen receptor negative status [ER-]: Secondary | ICD-10-CM

## 2019-07-10 DIAGNOSIS — C50411 Malignant neoplasm of upper-outer quadrant of right female breast: Secondary | ICD-10-CM | POA: Diagnosis not present

## 2019-07-10 DIAGNOSIS — Z5112 Encounter for antineoplastic immunotherapy: Secondary | ICD-10-CM | POA: Diagnosis not present

## 2019-07-10 DIAGNOSIS — Z95828 Presence of other vascular implants and grafts: Secondary | ICD-10-CM

## 2019-07-10 LAB — CBC WITH DIFFERENTIAL (CANCER CENTER ONLY)
Abs Immature Granulocytes: 0.02 10*3/uL (ref 0.00–0.07)
Basophils Absolute: 0.1 10*3/uL (ref 0.0–0.1)
Basophils Relative: 1 %
Eosinophils Absolute: 0.4 10*3/uL (ref 0.0–0.5)
Eosinophils Relative: 6 %
HCT: 39.9 % (ref 36.0–46.0)
Hemoglobin: 12.7 g/dL (ref 12.0–15.0)
Immature Granulocytes: 0 %
Lymphocytes Relative: 46 %
Lymphs Abs: 2.8 10*3/uL (ref 0.7–4.0)
MCH: 26.2 pg (ref 26.0–34.0)
MCHC: 31.8 g/dL (ref 30.0–36.0)
MCV: 82.3 fL (ref 80.0–100.0)
Monocytes Absolute: 0.6 10*3/uL (ref 0.1–1.0)
Monocytes Relative: 9 %
Neutro Abs: 2.4 10*3/uL (ref 1.7–7.7)
Neutrophils Relative %: 38 %
Platelet Count: 289 10*3/uL (ref 150–400)
RBC: 4.85 MIL/uL (ref 3.87–5.11)
RDW: 13.7 % (ref 11.5–15.5)
WBC Count: 6.1 10*3/uL (ref 4.0–10.5)
nRBC: 0 % (ref 0.0–0.2)

## 2019-07-10 LAB — CMP (CANCER CENTER ONLY)
ALT: 23 U/L (ref 0–44)
AST: 13 U/L — ABNORMAL LOW (ref 15–41)
Albumin: 3.9 g/dL (ref 3.5–5.0)
Alkaline Phosphatase: 78 U/L (ref 38–126)
Anion gap: 11 (ref 5–15)
BUN: 13 mg/dL (ref 8–23)
CO2: 26 mmol/L (ref 22–32)
Calcium: 9.2 mg/dL (ref 8.9–10.3)
Chloride: 103 mmol/L (ref 98–111)
Creatinine: 0.79 mg/dL (ref 0.44–1.00)
GFR, Est AFR Am: >60 mL/min (ref >60)
GFR, Estimated: >60 mL/min (ref >60)
Glucose, Bld: 196 mg/dL — ABNORMAL HIGH (ref 70–99)
Potassium: 3.8 mmol/L (ref 3.5–5.1)
Sodium: 140 mmol/L (ref 135–145)
Total Bilirubin: 0.3 mg/dL (ref 0.3–1.2)
Total Protein: 6.9 g/dL (ref 6.5–8.1)

## 2019-07-10 MED ORDER — PROCHLORPERAZINE EDISYLATE 10 MG/2ML IJ SOLN
10.0000 mg | Freq: Once | INTRAMUSCULAR | Status: AC
  Administered 2019-07-10: 10 mg via INTRAVENOUS

## 2019-07-10 MED ORDER — SODIUM CHLORIDE 0.9 % IV SOLN
Freq: Once | INTRAVENOUS | Status: AC
  Administered 2019-07-10: 10:00:00 via INTRAVENOUS
  Filled 2019-07-10: qty 250

## 2019-07-10 MED ORDER — PROCHLORPERAZINE EDISYLATE 10 MG/2ML IJ SOLN
INTRAMUSCULAR | Status: AC
  Filled 2019-07-10: qty 2

## 2019-07-10 MED ORDER — FAMOTIDINE IN NACL 20-0.9 MG/50ML-% IV SOLN
20.0000 mg | Freq: Once | INTRAVENOUS | Status: AC
  Administered 2019-07-10: 11:00:00 20 mg via INTRAVENOUS

## 2019-07-10 MED ORDER — DIPHENHYDRAMINE HCL 50 MG/ML IJ SOLN
INTRAMUSCULAR | Status: AC
  Filled 2019-07-10: qty 1

## 2019-07-10 MED ORDER — FAMOTIDINE IN NACL 20-0.9 MG/50ML-% IV SOLN
INTRAVENOUS | Status: AC
  Filled 2019-07-10: qty 50

## 2019-07-10 MED ORDER — PACLITAXEL PROTEIN-BOUND CHEMO INJECTION 100 MG: 100.0000 mg/m2 | Freq: Once | INTRAVENOUS | Status: DC

## 2019-07-10 MED ORDER — DIPHENHYDRAMINE HCL 50 MG/ML IJ SOLN
25.0000 mg | Freq: Once | INTRAMUSCULAR | Status: AC
  Administered 2019-07-10: 25 mg via INTRAVENOUS

## 2019-07-10 MED ORDER — PACLITAXEL PROTEIN-BOUND CHEMO INJECTION 100 MG
100.0000 mg/m2 | Freq: Once | INTRAVENOUS | Status: AC
  Administered 2019-07-10: 175 mg via INTRAVENOUS
  Filled 2019-07-10: qty 35

## 2019-07-10 MED ORDER — TRASTUZUMAB-DKST CHEMO 150 MG IV SOLR
300.0000 mg | Freq: Once | INTRAVENOUS | Status: AC
  Administered 2019-07-10: 300 mg via INTRAVENOUS
  Filled 2019-07-10: qty 14.29

## 2019-07-10 MED ORDER — HEPARIN SOD (PORK) LOCK FLUSH 100 UNIT/ML IV SOLN
500.0000 [IU] | Freq: Once | INTRAVENOUS | Status: AC | PRN
  Administered 2019-07-10: 500 [IU]
  Filled 2019-07-10: qty 5

## 2019-07-10 MED ORDER — ACETAMINOPHEN 325 MG PO TABS
650.0000 mg | ORAL_TABLET | Freq: Once | ORAL | Status: AC
  Administered 2019-07-10: 650 mg via ORAL

## 2019-07-10 MED ORDER — SODIUM CHLORIDE 0.9% FLUSH
10.0000 mL | INTRAVENOUS | Status: DC | PRN
  Administered 2019-07-10: 10 mL
  Filled 2019-07-10: qty 10

## 2019-07-10 MED ORDER — ACETAMINOPHEN 325 MG PO TABS
ORAL_TABLET | ORAL | Status: AC
  Filled 2019-07-10: qty 2

## 2019-07-10 MED ORDER — SODIUM CHLORIDE 0.9% FLUSH
10.0000 mL | INTRAVENOUS | Status: DC | PRN
  Administered 2019-07-10: 10 mL via INTRAVENOUS
  Filled 2019-07-10: qty 10

## 2019-07-10 NOTE — Progress Notes (Signed)
[place]  [phone] [phone]    ID: Megan Khan DOB: [birth year]  MR#: [id number]  GDJ#:[id number]  Patient Care Team: [name], DO as PCP - General [name], [name], MD as Consulting Physician (Oncology) Fanny Skates, MD as Consulting Physician (General Surgery) [name], MD as Consulting Physician (Radiation Oncology) [name], [name], DPM as Consulting Physician (Podiatry) [name], MD as Consulting Physician (Ophthalmology) [name], MD as Consulting Physician (Gastroenterology) Megan Khan Dresser, MD as Consulting Physician (Cardiology) [name], RN as Oncology Nurse Navigator [name], RN as Oncology Nurse Navigator OTHER MD:   CHIEF COMPLAINT: Estrogen receptor negative breast cancer  CURRENT TREATMENT: Abraxane/Trastuzumab   HISTORY OF CURRENT ILLNESS: From the original intake note:  Megan Khan had routine screening mammography on 02/24/2019 showing a possible abnormality in the right breast. She underwent unilateral right diagnostic mammography with tomography and right breast ultrasonography at [facility] on 03/04/2019 showing: Breast Density Category B. Additional imaging of the right breast was performed. There is a persistent asymmetry and distortion in the middle third of the upper-outer quadrant of the breast. There are no malignant type microcalcifications. On physical exam, I do not palpate a mass in the upper-outer quadrant of the right breast. Targeted ultrasound is performed, showing an irregular hypoechoic mass in the right breast 11 o'clock 7 cm from the nipple measuring 0.7 x 0.7 x 1.0 cm. Sonographic evaluation of the right axilla does not show any enlarged adenopathy.  Accordingly on 03/11/2019 she proceeded to biopsy of the right breast area in question. The pathology from this procedure showed ([id number]): invasive ductal carcinoma, grade II, 11 o'clock 7 cm from  the nipple. Prognostic indicators significant for: estrogen receptor, 0% negative and progesterone receptor, 0% negative. Proliferation marker Ki67 at 10%. HER2 positive (3+) by immunohistochemistry.  The patient's subsequent history is as detailed below.   INTERVAL HISTORY: Megan Khan is here for follow up of her HER-2 positive breast cancer.  She is due to start adjuvant chemotherapy with Abraxane and Trastuzumab (ogivri which is biosimilar) weekly x 12 weeks, and then will go onto receive Trastuzumab every 3 weeks x 1 year of therapy.  Her most recent echocardiogram was on 04/06/2019 and showed an EF of greater than 65%.     REVIEW OF SYSTEMS: Megan Khan has diabetes.  She does have what she calls diabetic neuropathy however she describes this as an aching sensation.  She denies numbness/tingling in the fingertips or toes.  She checks her blood sugar three times a day.  She forgot to check her blood sugar yesterday or this morning.    Megan Khan denies any fever, chills, chest pain, palpitations, cough, shortness of breath, nausea, vomiting, bowel/bladder changes, headaches, vision issues, or any other concerns.  A detailed ROS was otherwise non contributory.     PAST MEDICAL HISTORY: Past Medical History:  Diagnosis Date   Anemia    during pregnancy   Cancer ([place]) 2020   breast cancer on right   Diabetes mellitus    Type 2   Family history of breast cancer    Family history of throat cancer    Glaucoma    Glaucoma    Hx of colonic polyps    Hyperlipidemia    Hypertension    Legally blind    completely blind in left eye, low vision in right    PAST SURGICAL HISTORY: Past Surgical History:  Procedure Laterality Date   BREAST LUMPECTOMY WITH  RADIOACTIVE SEED AND SENTINEL LYMPH NODE BIOPSY Right 05/29/2019   Procedure: RIGHT BREAST LUMPECTOMY WITH RADIOACTIVE SEED AND RIGHT DEEP AXILLARY SENTINEL LYMPH NODE BIOPSY INJECT BLUE DYE;  Surgeon: Fanny Skates, MD;  Location: [place];  Service: General;  Laterality: Right;   COLONOSCOPY  01/22/2012   Procedure: COLONOSCOPY;  Surgeon: [name], MD;  Location: [facility];  Service: Endoscopy;  Laterality: N/A;   COLONOSCOPY     COLONOSCOPY     PORTACATH PLACEMENT N/A 05/29/2019   Procedure: INSERTION PORT-A-CATH WITH ULTRASOUND;  Surgeon: Fanny Skates, MD;  Location: [place];  Service: General;  Laterality: N/A;   RE-EXCISION OF BREAST LUMPECTOMY Right 06/10/2019   Procedure: RE-EXCISION OF RIGHT BREAST LUMPECTOMY MARGINS;  Surgeon: Fanny Skates, MD;  Location: [place];  Service: General;  Laterality: Right;   TUBAL LIGATION       FAMILY HISTORY: Family History  Problem Relation Age of Onset   Breast cancer Sister 76       Her2 +   Breast cancer Cousin        maternal second cousin, Bilateral mastectomy, diagnosed in her 71s   Joclyn's father died from natural causes at age 18. Patients' mother died from natural causes at age 12. The patient has 5 brothers and 3 sisters.  The patient's sister, [name], is my patient diagnosed with breast cancer at 92.  The cyst cancer was estrogen receptor positive, HER-2 negative.  She received no chemo.  Akili's maternal 2nd cousin was diagnosed with breast cancer. Patient denies anyone in her family having ovarian, prostate, or pancreatic cancer.    GYNECOLOGIC HISTORY:  No LMP recorded. Patient is postmenopausal. Menarche: 63 years old Age at first live birth: 64 years old GX P: 3 LMP: 74 Contraceptive: no HRT: no  Hysterectomy?: no BSO?: no   SOCIAL HISTORY: (Current as of 03/30/2019) Allyse is disabled secondary to her glaucoma.she is legally blind.  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 She is divorced. She lives with her son, [name] who is 9 and is disabled with cerebral palsy.  He does attend school.Her son [name] was killed (gunshot wound) age 61.Marland Kitchen Her son, [name], is 88 and works for this [employer]. Her  daughter, [name], is 13 and is a Quarry manager. Avleen has 7 grandchildren and 2 great-grandchildren.  She attends the [employer] [place] in place. She has verbally named her sister, [name], as her healthcare power of attorney.  At the 03/30/2019 visit she was given the appropriate documents to complete and notarize at her discretion  HEALTH MAINTENANCE: Social History   Tobacco Use   Smoking status: Former Smoker    Types: Cigarettes    Quit date: 09/23/1995    Years since quitting: 23.8   Smokeless tobacco: Never Used  Substance Use Topics   Alcohol use: No   Drug use: No    Colonoscopy: June 2013/again in  PAP: Per Dr. Criss Rosales  Bone density: Never  Allergies  Allergen Reactions   Sulfamethoxazole Rash    REACTION: Questionable allergy ?    Current Outpatient Medications  Medication Sig Dispense Refill   amLODipine (NORVASC) 10 MG tablet TAKE 1 TABLET(10 MG) BY MOUTH DAILY (Patient taking differently: Take 10 mg by mouth daily. TAKE 1 TABLET(10 MG) BY MOUTH DAILY) 90 tablet 3   aspirin 81 MG EC tablet Take 81 mg by mouth daily.  bimatoprost (LUMIGAN) 0.01 % SOLN Place 2 drops into the left eye at bedtime.      blood glucose meter kit and supplies KIT Dispense based on patient and insurance preference. Use up to four times daily as directed. (FOR ICD-9 250.00, 250.01). 1 each 12   dorzolamide (TRUSOPT) 2 % ophthalmic solution Place 2 drops into the left eye 3 (three) times daily.      gabapentin (NEURONTIN) 100 MG capsule Take 2 capsules (200 mg total) by mouth 3 (three) times daily. 180 capsule 0   lidocaine-prilocaine (EMLA) cream Apply to affected area once 30 g 3   lisinopril-hydrochlorothiazide (ZESTORETIC) 20-25 MG tablet Take 1 tablet by mouth daily. 90 tablet 3   lovastatin (MEVACOR) 20 MG tablet TAKE 1 TABLET BY MOUTH EVERY NIGHT AT BEDTIME (Patient taking differently: Take 20 mg by mouth at bedtime. ) 90 tablet 3    metFORMIN (GLUCOPHAGE) 1000 MG tablet Take 1 tablet (1,000 mg total) by mouth 2 (two) times daily with a meal. 360 tablet 1   metoprolol succinate (TOPROL-XL) 50 MG 24 hr tablet TAKE 1 TABLET(50 MG) BY MOUTH DAILY (Patient taking differently: Take 50 mg by mouth daily. ) 90 tablet 0   prochlorperazine (COMPAZINE) 10 MG tablet TAKE 1 TABLET(10 MG) BY MOUTH EVERY 6 HOURS AS NEEDED FOR NAUSEA OR VOMITING 30 tablet 1   No current facility-administered medications for this visit.      OBJECTIVE:  Vitals:   07/10/19 0840  BP: (!) 160/83  Pulse: 74  Resp: 17  Temp: 98 F (36.7 C)  SpO2: 100%     Body mass index is 29.44 kg/m.   Wt Readings from Last 3 Encounters:  07/10/19 158 lb 6.4 oz (71.8 kg)  06/26/19 156 lb 14.4 oz (71.2 kg)  06/10/19 135 lb (61.2 kg)  ECOG FS:1 - Symptomatic but completely ambulatory GENERAL: Patient is a well appearing female in no acute distress HEENT:  Sclerae anicteric.  Oropharynx clear and moist. No ulcerations or evidence of oropharyngeal candidiasis. Neck is supple.  NODES:  No cervical, supraclavicular, or axillary lymphadenopathy palpated.  BREAST EXAM:  deferred LUNGS:  Clear to auscultation bilaterally.  No wheezes or rhonchi. HEART:  Regular rate and rhythm. No murmur appreciated. ABDOMEN:  Soft, nontender.  Positive, normoactive bowel sounds. No organomegaly palpated. MSK:  No focal spinal tenderness to palpation. Full range of motion bilaterally in the upper extremities. EXTREMITIES:  No peripheral edema.   SKIN:  Clear with no obvious rashes or skin changes. No nail dyscrasia. NEURO:  Nonfocal. Well oriented.  Appropriate affect.     LAB RESULTS:  CMP     Component Value Date/Time   NA 141 06/26/2019 0805   NA 142 05/27/2018 1533   K 3.5 06/26/2019 0805   CL 103 06/26/2019 0805   CO2 27 06/26/2019 0805   GLUCOSE 205 (H) 06/26/2019 0805   BUN 9 06/26/2019 0805   BUN 19 05/27/2018 1533   CREATININE 0.81 06/26/2019 0805    CREATININE 0.79 01/30/2016 1130   CALCIUM 9.3 06/26/2019 0805   PROT 7.0 06/26/2019 0805   ALBUMIN 4.0 06/26/2019 0805   AST 10 (L) 06/26/2019 0805   ALT 18 06/26/2019 0805   ALKPHOS 78 06/26/2019 0805   BILITOT 0.4 06/26/2019 0805   GFRNONAA >60 06/26/2019 0805   GFRNONAA 82 01/30/2016 1130   GFRAA >60 06/26/2019 0805   GFRAA >89 01/30/2016 1130    No results found for: TOTALPROTELP, ALBUMINELP, A1GS, A2GS, BETS, BETA2SER, GAMS,  MSPIKE, SPEI  No results found for: Nils Pyle, [facility]  Lab Results  Component Value Date   WBC 6.1 07/10/2019   NEUTROABS 2.4 07/10/2019   HGB 12.7 07/10/2019   HCT 39.9 07/10/2019   MCV 82.3 07/10/2019   PLT 289 07/10/2019    _0 @  No results found for: LABCA2  No components found for: [id number]  No results for input(s): INR in the last 168 hours.  No results found for: LABCA2  No results found for: WCB762  No results found for: GBT517  No results found for: OHY073  No results found for: CA2729  No components found for: HGQUANT  No results found for: CEA1 / No results found for: CEA1   No results found for: AFPTUMOR  No results found for: CHROMOGRNA  No results found for: PSA1  Appointment on 07/10/2019  Component Date Value Ref Range Status   WBC Count 07/10/2019 6.1  4.0 - 10.5 K/uL Final   RBC 07/10/2019 4.85  3.87 - 5.11 MIL/uL Final   Hemoglobin 07/10/2019 12.7  12.0 - 15.0 g/dL Final   HCT 07/10/2019 39.9  36.0 - 46.0 % Final   MCV 07/10/2019 82.3  80.0 - 100.0 fL Final   MCH 07/10/2019 26.2  26.0 - 34.0 pg Final   MCHC 07/10/2019 31.8  30.0 - 36.0 g/dL Final   RDW 07/10/2019 13.7  11.5 - 15.5 % Final   Platelet Count 07/10/2019 289  150 - 400 K/uL Final   nRBC 07/10/2019 0.0  0.0 - 0.2 % Final   Neutrophils Relative % 07/10/2019 38  % Final   Neutro Abs 07/10/2019 2.4  1.7 - 7.7 K/uL Final   Lymphocytes Relative 07/10/2019 46  % Final   Lymphs Abs 07/10/2019 2.8  0.7 -  4.0 K/uL Final   Monocytes Relative 07/10/2019 9  % Final   Monocytes Absolute 07/10/2019 0.6  0.1 - 1.0 K/uL Final   Eosinophils Relative 07/10/2019 6  % Final   Eosinophils Absolute 07/10/2019 0.4  0.0 - 0.5 K/uL Final   Basophils Relative 07/10/2019 1  % Final   Basophils Absolute 07/10/2019 0.1  0.0 - 0.1 K/uL Final   Immature Granulocytes 07/10/2019 0  % Final   Abs Immature Granulocytes 07/10/2019 0.02  0.00 - 0.07 K/uL Final   Performed at [facility], [place] [address], [place], [place] [address]    (this displays the last labs from the last 3 days)  No results found for: TOTALPROTELP, ALBUMINELP, A1GS, A2GS, BETS, BETA2SER, GAMS, MSPIKE, SPEI (this displays SPEP labs)  No results found for: KPAFRELGTCHN, LAMBDASER, KAPLAMBRATIO (kappa/lambda light chains)  No results found for: HGBA, HGBA2QUANT, HGBFQUANT, HGBSQUAN (Hemoglobinopathy evaluation)   No results found for: LDH  No results found for: IRON, TIBC, IRONPCTSAT (Iron and TIBC)  No results found for: FERRITIN  Urinalysis No results found for: COLORURINE, APPEARANCEUR, LABSPEC, PHURINE, GLUCOSEU, HGBUR, BILIRUBINUR, KETONESUR, PROTEINUR, UROBILINOGEN, NITRITE, LEUKOCYTESUR   STUDIES:  No results found.   ELIGIBLE FOR AVAILABLE RESEARCH PROTOCOL: UPBEAT   ASSESSMENT: 62 y.o. [place], [place] woman status post right breast upper outer quadrant biopsy 03/11/2019 for a clinical t1b N0, stage IA invasive ductal carcinoma, grade 2, estrogen and progesterone receptor negative, HER-2 amplified, with an MIB-1 of 10%.  (1) genetics testing 03/30/2019: declined by patient  (2) Right breast lumpectomy on 05/29/2019: pT1b pN0, stage IA invasive ductal carciinoma, grade 2,   (a) a total of 5 lymph nodes were negative for carcinoma  (b) superior  margin positive, cleared with addition surgery on 06/10/2019   (c) surgery delayed due to abnormal EKG, patient cleared by Dr. Gwenlyn Found on  04/21/2019  (3) adjuvant chemotherapy to consist of Abraxane weekly x12 with Trastuzumab, starting 07/10/2019  (a) Abraxane given due to diabetes, and inability to tolerate steroid premedication required with Paclitaxel.  (4) trastuzumab to be continued to complete 1 year  (a) baseline echo 04/06/2019 shows an ejection fraction greater than 65%  (5) adjuvant radiation to follow.  PLAN: Sakira is doing well today.  Her CBC is normal and I reviewed this with her in detail.  Her CMET is pending.  She will receive chemotherapy with Abraxane/Trastuzumab today.  She will receive this weekly for 12 weeks.  I reviewed with her how to take her anti emetics, and reviewed the timing of her treatment.  She is not a candidate for cryotherapy prophylaxis due to her type two diabetes.    Malachy Mood and I talked about her diabetes and she is going to monitor her sugars closely. I will refer her to Dr. Haroldine Laws today.  I encouraged Naziya to keep a diary of any side effects she may experience this next week following treatment.    Dariah will return in 1 week for labs, f/u with Dr. Jana Hakim, and her next chemotherapy.  She was recommended to continue with the appropriate pandemic precautions. She knows to call for any questions that may arise between now and her next appointment.  We are happy to see her sooner if needed.  A total of (20) minutes of face-to-face time was spent with this patient with greater than 50% of that time in counseling and care-coordination.   Wilber Bihari, NP  07/10/19 8:51 AM Medical Oncology and Hematology [facility] [address] [place], [place] [address] Tel. [phone]    Fax. [phone]

## 2019-07-10 NOTE — Patient Instructions (Signed)

## 2019-07-10 NOTE — Patient Instructions (Signed)
[facility] Discharge Instructions for Patients Receiving Chemotherapy  Today you received the following chemotherapy agents Ogivri and Abraxane  To help prevent nausea and vomiting after your treatment, we encourage you to take your nausea medication as prescribed.   If you develop nausea and vomiting that is not controlled by your nausea medication, call the clinic.   BELOW ARE SYMPTOMS THAT SHOULD BE REPORTED IMMEDIATELY:  *FEVER GREATER THAN 100.5 F  *CHILLS WITH OR WITHOUT FEVER  NAUSEA AND VOMITING THAT IS NOT CONTROLLED WITH YOUR NAUSEA MEDICATION  *UNUSUAL SHORTNESS OF BREATH  *UNUSUAL BRUISING OR BLEEDING  TENDERNESS IN MOUTH AND THROAT WITH OR WITHOUT PRESENCE OF ULCERS  *URINARY PROBLEMS  *BOWEL PROBLEMS  UNUSUAL RASH Items with * indicate a potential emergency and should be followed up as soon as possible.  Feel free to call the clinic should you have any questions or concerns. The clinic phone number is [phone].  Please show the [facility] at check-in to the Emergency Department and triage nurse.  Trastuzumab injection for infusion What is this medicine? TRASTUZUMAB (tras TOO zoo mab) is a monoclonal antibody. It is used to treat breast cancer and stomach cancer. This medicine may be used for other purposes; ask your health care provider or pharmacist if you have questions. COMMON BRAND NAME(S): Herceptin, Galvin Proffer, Trazimera What should I tell my health care provider before I take this medicine? They need to know if you have any of these conditions:  heart disease  heart failure  lung or breathing disease, like asthma  an unusual or allergic reaction to trastuzumab, benzyl alcohol, or other medications, foods, dyes, or preservatives  pregnant or trying to get pregnant  breast-feeding How should I use this medicine? This drug is given as an infusion into a vein. It is administered in a hospital  or clinic by a specially trained health care professional. Talk to your pediatrician regarding the use of this medicine in children. This medicine is not approved for use in children. Overdosage: If you think you have taken too much of this medicine contact a poison control center or emergency room at once. NOTE: This medicine is only for you. Do not share this medicine with others. What if I miss a dose? It is important not to miss a dose. Call your doctor or health care professional if you are unable to keep an appointment. What may interact with this medicine? This medicine may interact with the following medications:  certain types of chemotherapy, such as daunorubicin, doxorubicin, epirubicin, and idarubicin This list may not describe all possible interactions. Give your health care provider a list of all the medicines, herbs, non-prescription drugs, or dietary supplements you use. Also tell them if you smoke, drink alcohol, or use illegal drugs. Some items may interact with your medicine. What should I watch for while using this medicine? Visit your doctor for checks on your progress. Report any side effects. Continue your course of treatment even though you feel ill unless your doctor tells you to stop. Call your doctor or health care professional for advice if you get a fever, chills or sore throat, or other symptoms of a cold or flu. Do not treat yourself. Try to avoid being around people who are sick. You may experience fever, chills and shaking during your first infusion. These effects are usually mild and can be treated with other medicines. Report any side effects during the infusion to your health care professional. Fever  and chills usually do not happen with later infusions. Do not become pregnant while taking this medicine or for 7 months after stopping it. Women should inform their doctor if they wish to become pregnant or think they might be pregnant. Women of child-bearing potential  will need to have a negative pregnancy test before starting this medicine. There is a potential for serious side effects to an unborn child. Talk to your health care professional or pharmacist for more information. Do not breast-feed an infant while taking this medicine or for 7 months after stopping it. Women must use effective birth control with this medicine. What side effects may I notice from receiving this medicine? Side effects that you should report to your doctor or health care professional as soon as possible:  allergic reactions like skin rash, itching or hives, swelling of the face, lips, or tongue  chest pain or palpitations  cough  dizziness  feeling faint or lightheaded, falls  fever  general ill feeling or flu-like symptoms  signs of worsening heart failure like breathing problems; swelling in your legs and feet  unusually weak or tired Side effects that usually do not require medical attention (report to your doctor or health care professional if they continue or are bothersome):  bone pain  changes in taste  diarrhea  joint pain  nausea/vomiting  weight loss This list may not describe all possible side effects. Call your doctor for medical advice about side effects. You may report side effects to FDA at 1-800-FDA-1088. Where should I keep my medicine? This drug is given in a hospital or clinic and will not be stored at home. NOTE: This sheet is a summary. It may not cover all possible information. If you have questions about this medicine, talk to your doctor, pharmacist, or health care provider.  2020 Elsevier/Gold Standard (2016-07-24 14:37:52)  Nanoparticle Albumin-Bound Paclitaxel injection What is this medicine? NANOPARTICLE ALBUMIN-BOUND PACLITAXEL (Na no PAHR ti kuhl al BYOO muhn-bound PAK li TAX el) is a chemotherapy drug. It targets fast dividing cells, like cancer cells, and causes these cells to die. This medicine is used to treat advanced  breast cancer, lung cancer, and pancreatic cancer. This medicine may be used for other purposes; ask your health care provider or pharmacist if you have questions. COMMON BRAND NAME(S): Abraxane What should I tell my health care provider before I take this medicine? They need to know if you have any of these conditions:  kidney disease  liver disease  low blood counts, like low white cell, platelet, or red cell counts  lung or breathing disease, like asthma  tingling of the fingers or toes, or other nerve disorder  an unusual or allergic reaction to paclitaxel, albumin, other chemotherapy, other medicines, foods, dyes, or preservatives  pregnant or trying to get pregnant  breast-feeding How should I use this medicine? This drug is given as an infusion into a vein. It is administered in a hospital or clinic by a specially trained health care professional. Talk to your pediatrician regarding the use of this medicine in children. Special care may be needed. Overdosage: If you think you have taken too much of this medicine contact a poison control center or emergency room at once. NOTE: This medicine is only for you. Do not share this medicine with others. What if I miss a dose? It is important not to miss your dose. Call your doctor or health care professional if you are unable to keep an appointment. What may interact  with this medicine? This medicine may interact with the following medications:  antiviral medicines for hepatitis, HIV or AIDS  certain antibiotics like erythromycin and clarithromycin  certain medicines for fungal infections like ketoconazole and itraconazole  certain medicines for seizures like carbamazepine, phenobarbital, phenytoin  gemfibrozil  nefazodone  rifampin  St. John's wort This list may not describe all possible interactions. Give your health care provider a list of all the medicines, herbs, non-prescription drugs, or dietary supplements you  use. Also tell them if you smoke, drink alcohol, or use illegal drugs. Some items may interact with your medicine. What should I watch for while using this medicine? Your condition will be monitored carefully while you are receiving this medicine. You will need important blood work done while you are taking this medicine. This medicine can cause serious allergic reactions. If you experience allergic reactions like skin rash, itching or hives, swelling of the face, lips, or tongue, tell your doctor or health care professional right away. In some cases, you may be given additional medicines to help with side effects. Follow all directions for their use. This drug may make you feel generally unwell. This is not uncommon, as chemotherapy can affect healthy cells as well as cancer cells. Report any side effects. Continue your course of treatment even though you feel ill unless your doctor tells you to stop. Call your doctor or health care professional for advice if you get a fever, chills or sore throat, or other symptoms of a cold or flu. Do not treat yourself. This drug decreases your body's ability to fight infections. Try to avoid being around people who are sick. This medicine may increase your risk to bruise or bleed. Call your doctor or health care professional if you notice any unusual bleeding. Be careful brushing and flossing your teeth or using a toothpick because you may get an infection or bleed more easily. If you have any dental work done, tell your dentist you are receiving this medicine. Avoid taking products that contain aspirin, acetaminophen, ibuprofen, naproxen, or ketoprofen unless instructed by your doctor. These medicines may hide a fever. Do not become pregnant while taking this medicine or for 6 months after stopping it. Women should inform their doctor if they wish to become pregnant or think they might be pregnant. Men should not father a child while taking this medicine or for 3  months after stopping it. There is a potential for serious side effects to an unborn child. Talk to your health care professional or pharmacist for more information. Do not breast-feed an infant while taking this medicine or for 2 weeks after stopping it. This medicine may interfere with the ability to get pregnant or to father a child. You should talk to your doctor or health care professional if you are concerned about your fertility. What side effects may I notice from receiving this medicine? Side effects that you should report to your doctor or health care professional as soon as possible:  allergic reactions like skin rash, itching or hives, swelling of the face, lips, or tongue  breathing problems  changes in vision  fast, irregular heartbeat  low blood pressure  mouth sores  pain, tingling, numbness in the hands or feet  signs of decreased platelets or bleeding - bruising, pinpoint red spots on the skin, black, tarry stools, blood in the urine  signs of decreased red blood cells - unusually weak or tired, feeling faint or lightheaded, falls  signs of infection - fever or  chills, cough, sore throat, pain or difficulty passing urine  signs and symptoms of liver injury like dark yellow or brown urine; general ill feeling or flu-like symptoms; light-colored stools; loss of appetite; nausea; right upper belly pain; unusually weak or tired; yellowing of the eyes or skin  swelling of the ankles, feet, hands  unusually slow heartbeat Side effects that usually do not require medical attention (report to your doctor or health care professional if they continue or are bothersome):  diarrhea  hair loss  loss of appetite  nausea, vomiting  tiredness This list may not describe all possible side effects. Call your doctor for medical advice about side effects. You may report side effects to FDA at 1-800-FDA-1088. Where should I keep my medicine? This drug is given in a hospital or  clinic and will not be stored at home. NOTE: This sheet is a summary. It may not cover all possible information. If you have questions about this medicine, talk to your doctor, pharmacist, or health care provider.  2020 Elsevier/Gold Standard (2017-04-02 13:03:45)  Coronavirus (COVID-19) Are you at risk?  Are you at risk for the Coronavirus (COVID-19)?  To be considered HIGH RISK for Coronavirus (COVID-19), you have to meet the following criteria:  . Traveled to [place], [place], [place], [place] or [place]; or in the [place] to [place], [place], [place], or [place]; and have fever, cough, and shortness of breath within the last 2 weeks of travel OR . Been in close contact with a person diagnosed with COVID-19 within the last 2 weeks and have fever, cough, and shortness of breath . IF YOU DO NOT MEET THESE CRITERIA, YOU ARE CONSIDERED LOW RISK FOR COVID-19.  What to do if you are HIGH RISK for COVID-19?  Marland Kitchen If you are having a medical emergency, call 911. . Seek medical care right away. Before you go to a doctor's office, urgent care or emergency department, call ahead and tell them about your recent travel, contact with someone diagnosed with COVID-19, and your symptoms. You should receive instructions from your physician's office regarding next steps of care.  . When you arrive at healthcare provider, tell the healthcare staff immediately you have returned from visiting [place], [place], [place], [place] or [place]; or traveled in the [place] to [place], [name], [place], or [place]; in the last two weeks or you have been in close contact with a person diagnosed with COVID-19 in the last 2 weeks.   . Tell the health care staff about your symptoms: fever, cough and shortness of breath. . After you have been seen by a medical provider, you will be either: o Tested for (COVID-19) and discharged home on quarantine except to seek medical care if symptoms worsen, and asked to   - Stay home and avoid contact with others until you get your results (4-5 days)  - Avoid travel on public transportation if possible (such as bus, train, or airplane) or o Sent to the Emergency Department by EMS for evaluation, COVID-19 testing, and possible admission depending on your condition and test results.  What to do if you are LOW RISK for COVID-19?  Reduce your risk of any infection by using the same precautions used for avoiding the common cold or flu:  Marland Kitchen Wash your hands often with soap and warm water for at least 20 seconds.  If soap and water are not readily available, use an alcohol-based hand sanitizer with at least 60% alcohol.  Marland Kitchen  If coughing or sneezing, cover your mouth and nose by coughing or sneezing into the elbow areas of your shirt or coat, into a tissue or into your sleeve (not your hands). . Avoid shaking hands with others and consider head nods or verbal greetings only. . Avoid touching your eyes, nose, or mouth with unwashed hands.  . Avoid close contact with people who are sick. . Avoid places or events with large numbers of people in one location, like concerts or sporting events. . Carefully consider travel plans you have or are making. . If you are planning any travel outside or inside the [place], visit the CDC's Travelers' Health webpage for the latest health notices. . If you have some symptoms but not all symptoms, continue to monitor at home and seek medical attention if your symptoms worsen. . If you are having a medical emergency, call 911.   [facility] / e-Visit: [email or web address]         [facility]: [facility]: [id number]                   [facility]: [phone]

## 2019-07-13 ENCOUNTER — Telehealth: Payer: Self-pay | Admitting: *Deleted

## 2019-07-13 NOTE — Telephone Encounter (Signed)
Called pt to see how she did with her treatment last week & she denies any problems & also states she knows about side effects & reasons to call.

## 2019-07-13 NOTE — Telephone Encounter (Signed)
-----   Message from Rolene Course, RN sent at 07/10/2019  2:21 PM EST ----- Regarding: [name] 1st Tx F/U call - Ogivri & Abraxane [name] 1st Tx F/U call - Ogivri & Abraxane

## 2019-07-14 ENCOUNTER — Other Ambulatory Visit: Payer: Self-pay | Admitting: *Deleted

## 2019-07-14 DIAGNOSIS — C50411 Malignant neoplasm of upper-outer quadrant of right female breast: Secondary | ICD-10-CM

## 2019-07-14 DIAGNOSIS — Z9221 Personal history of antineoplastic chemotherapy: Secondary | ICD-10-CM

## 2019-07-14 DIAGNOSIS — Z171 Estrogen receptor negative status [ER-]: Secondary | ICD-10-CM

## 2019-07-14 HISTORY — DX: Personal history of antineoplastic chemotherapy: Z92.21

## 2019-07-15 ENCOUNTER — Ambulatory Visit (INDEPENDENT_AMBULATORY_CARE_PROVIDER_SITE_OTHER): Payer: Medicare Other | Admitting: Family Medicine

## 2019-07-15 ENCOUNTER — Other Ambulatory Visit: Payer: Medicare Other

## 2019-07-15 ENCOUNTER — Encounter: Payer: Self-pay | Admitting: Family Medicine

## 2019-07-15 ENCOUNTER — Ambulatory Visit: Payer: Medicare Other

## 2019-07-15 ENCOUNTER — Other Ambulatory Visit: Payer: Self-pay

## 2019-07-15 VITALS — BP 120/80 | HR 72 | Temp 97.7°F | Wt 160.0 lb

## 2019-07-15 DIAGNOSIS — N181 Chronic kidney disease, stage 1: Secondary | ICD-10-CM | POA: Diagnosis not present

## 2019-07-15 DIAGNOSIS — C50411 Malignant neoplasm of upper-outer quadrant of right female breast: Secondary | ICD-10-CM

## 2019-07-15 DIAGNOSIS — Z171 Estrogen receptor negative status [ER-]: Secondary | ICD-10-CM | POA: Diagnosis not present

## 2019-07-15 DIAGNOSIS — E1122 Type 2 diabetes mellitus with diabetic chronic kidney disease: Secondary | ICD-10-CM | POA: Diagnosis not present

## 2019-07-15 LAB — POCT GLYCOSYLATED HEMOGLOBIN (HGB A1C): HbA1c, POC (controlled diabetic range): 8.9 % — AB (ref 0.0–7.0)

## 2019-07-16 ENCOUNTER — Other Ambulatory Visit: Payer: Self-pay | Admitting: *Deleted

## 2019-07-16 NOTE — Assessment & Plan Note (Signed)
Patient with A1c down to 8.9 from high of 11, she is also had most recent history of lumpectomy and starting chemoradiation for breast cancer.  She says that she has been doing well.  Thinks she has a good appetite but has been trying to eat a little healthier, is consistent with her medication.  No changes at this time

## 2019-07-16 NOTE — Progress Notes (Signed)
[name]  [phone] [phone]    ID: [name] DOB: 20-Mar-1956  MR#: [id number]  NUU#:[id number]  Patient Care Team: [name], DO as PCP - General [name], [name], MD as PCP - Advanced Heart Failure (Cardiology) [name], [name], MD as Consulting Physician (Oncology) Fanny Skates, MD as Consulting Physician (General Surgery) [name], MD as Consulting Physician (Radiation Oncology) Wallene Huh, DPM as Consulting Physician (Podiatry) [name], MD as Consulting Physician (Ophthalmology) [name], MD as Consulting Physician (Gastroenterology) Larey Dresser, MD as Consulting Physician (Cardiology) [name], RN as Oncology Nurse Navigator [name], RN as Oncology Nurse Navigator OTHER MD:   CHIEF COMPLAINT: Estrogen receptor negative breast cancer  CURRENT TREATMENT: Abraxane/Trastuzumab   INTERVAL HISTORY: Megan Khan is here for follow up of her HER-2 positive breast cancer accompanied by her son.  She began adjuvant chemotherapy with Abraxane and Trastuzumab (ogivri which is biosimilar) on 07/10/2019. She will receive this weekly x 12 weeks, and then will go onto receive Trastuzumab every 3 weeks to complete1 year of therapy.    Today is day 1 cycle 2 of her chemoimmunotherapy.  She tolerated her first cycle remarkably well.  She had absolutely no nausea or vomiting although she did not take any Compazine at home.  She tells me she slept through the treatment and then took a nap at home when she got there.  The next day she felt like her normal self.  Her most recent echocardiogram was on 04/06/2019 and showed an EF of greater than 65%.  She is scheduled for repeat 07/22/2019   REVIEW OF SYSTEMS: Sheritha has had no neuropathy, no unusual headaches, no cough or phlegm production, no phlegm, no fever, no pleurisy, and no problems with constipation or diarrhea.  She tells me her port is working  well and is easy of access.  Her son also agrees that she is doing well.  A detailed review of systems was otherwise stable   HISTORY OF CURRENT ILLNESS: From the original intake note:  Megan Khan had routine screening mammography on 02/24/2019 showing a possible abnormality in the right breast. She underwent unilateral right diagnostic mammography with tomography and right breast ultrasonography at [facility] on 03/04/2019 showing: Breast Density Category B. Additional imaging of the right breast was performed. There is a persistent asymmetry and distortion in the middle third of the upper-outer quadrant of the breast. There are no malignant type microcalcifications. On physical exam, I do not palpate a mass in the upper-outer quadrant of the right breast. Targeted ultrasound is performed, showing an irregular hypoechoic mass in the right breast 11 o'clock 7 cm from the nipple measuring 0.7 x 0.7 x 1.0 cm. Sonographic evaluation of the right axilla does not show any enlarged adenopathy.  Accordingly on 03/11/2019 she proceeded to biopsy of the right breast area in question. The pathology from this procedure showed ([id number]): invasive ductal carcinoma, grade II, 11 o'clock 7 cm from the nipple. Prognostic indicators significant for: estrogen receptor, 0% negative and progesterone receptor, 0% negative. Proliferation marker Ki67 at 10%. HER2 positive (3+) by immunohistochemistry.  The patient's subsequent history is as detailed below.    PAST MEDICAL HISTORY: Past Medical History:  Diagnosis Date  . Anemia    during pregnancy  . Cancer [facility]) 2020   breast cancer on right  . Diabetes mellitus    Type 2  . Family history of breast cancer   .  Family history of throat cancer   . Glaucoma   . Glaucoma   . Hx of colonic polyps   . Hyperlipidemia   . Hypertension   . Legally blind    completely blind in left eye, low vision in right    PAST SURGICAL HISTORY: Past Surgical  History:  Procedure Laterality Date  . BREAST LUMPECTOMY WITH RADIOACTIVE SEED AND SENTINEL LYMPH NODE BIOPSY Right 05/29/2019   Procedure: RIGHT BREAST LUMPECTOMY WITH RADIOACTIVE SEED AND RIGHT DEEP AXILLARY SENTINEL LYMPH NODE BIOPSY INJECT BLUE DYE;  Surgeon: Fanny Skates, MD;  [facility];  Service: General;  Laterality: Right;  . COLONOSCOPY  01/22/2012   Procedure: COLONOSCOPY;  Surgeon: [name], MD;  Location: [facility];  Service: Endoscopy;  Laterality: N/A;  . COLONOSCOPY    . COLONOSCOPY    . PORTACATH PLACEMENT N/A 05/29/2019   Procedure: INSERTION PORT-A-CATH WITH ULTRASOUND;  Surgeon: Fanny Skates, MD;  Location: [place];  Service: General;  Laterality: N/A;  . RE-EXCISION OF BREAST LUMPECTOMY Right 06/10/2019   Procedure: RE-EXCISION OF RIGHT BREAST LUMPECTOMY MARGINS;  Surgeon: Fanny Skates, MD;  Location: [place];  Service: General;  Laterality: Right;  . TUBAL LIGATION       FAMILY HISTORY: Family History  Problem Relation Age of Onset  . Breast cancer Sister 71       Her2 +  . Breast cancer Cousin        maternal second cousin, Bilateral mastectomy, diagnosed in her 27s   Megan Khan's father died from natural causes at age 49. Patients' mother died from natural causes at age 67. The patient has 5 brothers and 3 sisters.  The patient's sister, [name], is my patient diagnosed with breast cancer at 65.  The cyst cancer was estrogen receptor positive, HER-2 negative.  She received no chemo.  Megan Khan's maternal 2nd cousin was diagnosed with breast cancer. Patient denies anyone in her family having ovarian, prostate, or pancreatic cancer.    GYNECOLOGIC HISTORY:  No LMP recorded. Patient is postmenopausal. Menarche: 63 years old Age at first live birth: 63 years old GX P: 3 LMP: 82 Contraceptive: no HRT: no  Hysterectomy?: no BSO?: no   SOCIAL HISTORY: (Current as of 03/30/2019) [name] is disabled secondary to her glaucoma.she is legally blind.  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 She is divorced. She lives with her son, [name] who is 2 and is disabled with cerebral palsy.  He does attend school. Her son [name] was killed (gunshot wound) age 65.Marland Kitchen Her son, [name], is 18 and works for this [employer]. Her daughter, [name], is 77 and is a Quarry manager. Mahogani has 7 grandchildren and 2 great-grandchildren.  She attends the [place] [employer]: Not in place. She has verbally named her sister, [name], as her healthcare power of attorney.  At the 03/30/2019 visit she was given the appropriate documents to complete and notarize at her discretion  HEALTH MAINTENANCE: Social History   Tobacco Use  . Smoking status: Former Smoker    Types: Cigarettes    Quit date: 09/23/1995    Years since quitting: 23.8  . Smokeless tobacco: Never Used  Substance Use Topics  . Alcohol use: No  . Drug use: No    Colonoscopy: June 2013/again in  PAP: Per Dr. Criss Rosales  Bone density: Never  Allergies  Allergen Reactions  . Sulfamethoxazole Rash  REACTION: Questionable allergy ?    Current Outpatient Medications  Medication Sig Dispense Refill  . amLODipine (NORVASC) 10 MG tablet TAKE 1 TABLET(10 MG) BY MOUTH DAILY (Patient taking differently: Take 10 mg by mouth daily. TAKE 1 TABLET(10 MG) BY MOUTH DAILY) 90 tablet 3  . aspirin 81 MG EC tablet Take 81 mg by mouth daily.      . bimatoprost (LUMIGAN) 0.01 % SOLN Place 2 drops into the left eye at bedtime.     . blood glucose meter kit and supplies KIT Dispense based on patient and insurance preference. Use up to four times daily as directed. (FOR ICD-9 250.00, 250.01). 1 each 12  . dorzolamide (TRUSOPT) 2 % ophthalmic solution Place 2 drops into the left eye 3 (three) times daily.     Marland Kitchen gabapentin (NEURONTIN) 100 MG capsule Take 2 capsules (200 mg total) by mouth 3 (three) times daily. 180 capsule 0  . lidocaine-prilocaine (EMLA) cream Apply to  affected area once 30 g 3  . lisinopril-hydrochlorothiazide (ZESTORETIC) 20-25 MG tablet Take 1 tablet by mouth daily. 90 tablet 3  . lovastatin (MEVACOR) 20 MG tablet TAKE 1 TABLET BY MOUTH EVERY NIGHT AT BEDTIME (Patient taking differently: Take 20 mg by mouth at bedtime. ) 90 tablet 3  . metFORMIN (GLUCOPHAGE) 1000 MG tablet Take 1 tablet (1,000 mg total) by mouth 2 (two) times daily with a meal. 360 tablet 1  . metoprolol succinate (TOPROL-XL) 50 MG 24 hr tablet TAKE 1 TABLET(50 MG) BY MOUTH DAILY (Patient taking differently: Take 50 mg by mouth daily. ) 90 tablet 0  . prochlorperazine (COMPAZINE) 10 MG tablet TAKE 1 TABLET(10 MG) BY MOUTH EVERY 6 HOURS AS NEEDED FOR NAUSEA OR VOMITING 30 tablet 1   No current facility-administered medications for this visit.      OBJECTIVE: Middle-aged African-American woman in no acute distress  Vitals:   07/17/19 1034  BP: (!) 144/84  Pulse: 66  Resp: 18  Temp: 98.7 F (37.1 C)  SpO2: 99%     Body mass index is 29.57 kg/m.   Wt Readings from Last 3 Encounters:  07/17/19 159 lb 1.6 oz (72.2 kg)  07/15/19 160 lb (72.6 kg)  07/10/19 158 lb 6.4 oz (71.8 kg)  ECOG FS:1 - Symptomatic but completely ambulatory  Patient is legally blind Wearing a mask No cervical or supraclavicular adenopathy Lungs no rales or rhonchi Heart regular rate and rhythm Abd soft, nontender, positive bowel sounds MSK no focal spinal tenderness, no upper extremity lymphedema Neuro: nonfocal, well oriented, appropriate affect Breasts: Deferred   LAB RESULTS:  CMP     Component Value Date/Time   NA 140 07/10/2019 0805   NA 142 05/27/2018 1533   K 3.8 07/10/2019 0805   CL 103 07/10/2019 0805   CO2 26 07/10/2019 0805   GLUCOSE 196 (H) 07/10/2019 0805   BUN 13 07/10/2019 0805   BUN 19 05/27/2018 1533   CREATININE 0.79 07/10/2019 0805   CREATININE 0.79 01/30/2016 1130   CALCIUM 9.2 07/10/2019 0805   PROT 6.9 07/10/2019 0805   ALBUMIN 3.9 07/10/2019 0805    AST 13 (L) 07/10/2019 0805   ALT 23 07/10/2019 0805   ALKPHOS 78 07/10/2019 0805   BILITOT 0.3 07/10/2019 0805   GFRNONAA >60 07/10/2019 0805   GFRNONAA 82 01/30/2016 1130   GFRAA >60 07/10/2019 0805   GFRAA >89 01/30/2016 1130    No results found for: TOTALPROTELP, ALBUMINELP, A1GS, A2GS, BETS, BETA2SER, GAMS, MSPIKE, SPEI  No  results found for: Nils Pyle, [facility]  Lab Results  Component Value Date   WBC 5.2 07/17/2019   NEUTROABS 2.2 07/17/2019   HGB 11.9 (L) 07/17/2019   HCT 37.3 07/17/2019   MCV 83.3 07/17/2019   PLT 280 07/17/2019    No results found for: LABCA2  No components found for: [id number]  No results for input(s): INR in the last 168 hours.  No results found for: LABCA2  No results found for: ZJI967  No results found for: ELF810  No results found for: FBP102  No results found for: CA2729  No components found for: HGQUANT  No results found for: CEA1 / No results found for: CEA1   No results found for: AFPTUMOR  No results found for: CHROMOGRNA  No results found for: PSA1  Appointment on 07/17/2019  Component Date Value Ref Range Status  . WBC Count 07/17/2019 5.2  4.0 - 10.5 K/uL Final  . RBC 07/17/2019 4.48  3.87 - 5.11 MIL/uL Final  . Hemoglobin 07/17/2019 11.9* 12.0 - 15.0 g/dL Final  . HCT 07/17/2019 37.3  36.0 - 46.0 % Final  . MCV 07/17/2019 83.3  80.0 - 100.0 fL Final  . MCH 07/17/2019 26.6  26.0 - 34.0 pg Final  . MCHC 07/17/2019 31.9  30.0 - 36.0 g/dL Final  . RDW 07/17/2019 13.6  11.5 - 15.5 % Final  . Platelet Count 07/17/2019 280  150 - 400 K/uL Final  . nRBC 07/17/2019 0.0  0.0 - 0.2 % Final  . Neutrophils Relative % 07/17/2019 41  % Final  . Neutro Abs 07/17/2019 2.2  1.7 - 7.7 K/uL Final  . Lymphocytes Relative 07/17/2019 44  % Final  . Lymphs Abs 07/17/2019 2.3  0.7 - 4.0 K/uL Final  . Monocytes Relative 07/17/2019 7  % Final  . Monocytes Absolute 07/17/2019 0.4  0.1 - 1.0 K/uL Final  . Eosinophils  Relative 07/17/2019 6  % Final  . Eosinophils Absolute 07/17/2019 0.3  0.0 - 0.5 K/uL Final  . Basophils Relative 07/17/2019 1  % Final  . Basophils Absolute 07/17/2019 0.1  0.0 - 0.1 K/uL Final  . Immature Granulocytes 07/17/2019 1  % Final  . Abs Immature Granulocytes 07/17/2019 0.03  0.00 - 0.07 K/uL Final   Performed at [facility], [place] [address], [place], [place] [address]  Office Visit on 07/15/2019  Component Date Value Ref Range Status  . HbA1c, POC (controlled diabetic ra* 07/15/2019 8.9* 0.0 - 7.0 % Final    (this displays the last labs from the last 3 days)  No results found for: TOTALPROTELP, ALBUMINELP, A1GS, A2GS, BETS, BETA2SER, GAMS, MSPIKE, SPEI (this displays SPEP labs)  No results found for: KPAFRELGTCHN, LAMBDASER, KAPLAMBRATIO (kappa/lambda light chains)  No results found for: HGBA, HGBA2QUANT, HGBFQUANT, HGBSQUAN (Hemoglobinopathy evaluation)   No results found for: LDH  No results found for: IRON, TIBC, IRONPCTSAT (Iron and TIBC)  No results found for: FERRITIN  Urinalysis No results found for: COLORURINE, APPEARANCEUR, LABSPEC, PHURINE, GLUCOSEU, HGBUR, BILIRUBINUR, KETONESUR, PROTEINUR, UROBILINOGEN, NITRITE, LEUKOCYTESUR   STUDIES:  No results found.   ELIGIBLE FOR AVAILABLE RESEARCH PROTOCOL: UPBEAT  ASSESSMENT: 63 y.o. [place], [place] woman status post right breast upper outer quadrant biopsy 03/11/2019 for a clinical t1b N0, stage IA invasive ductal carcinoma, grade 2, estrogen and progesterone receptor negative, HER-2 amplified, with an MIB-1 of 10%.  (1) genetics testing 03/30/2019: declined by patient  (2) Right breast lumpectomy on 05/29/2019: pT1b pN0, stage IA invasive ductal  carciinoma, grade 2,   (a) a total of 5 lymph nodes were negative for carcinoma  (b) superior margin positive, cleared with addition surgery on 06/10/2019   (c) surgery delayed due to abnormal EKG, patient cleared by Dr. Gwenlyn Found on 04/21/2019   (3) adjuvant chemotherapy to consist of Abraxane weekly x12 with Trastuzumab, starting 07/10/2019  (a) Abraxane given due to diabetes, and inability to tolerate steroid premedication required with Paclitaxel.  (4) trastuzumab to be continued to complete 1 year  (a) baseline echo 04/06/2019 shows an ejection fraction greater than 65%  (5) adjuvant radiation to follow.  PLAN: Zarah tolerated her first cycle of Abraxane and trastuzumab remarkably well.  I am making no changes in her treatment.  She will repeated weekly until she completes 12 cycles.  Her most recent hemoglobin A1c was 8.9, which of course is quite elevated, but she tells me it was greater than 10 previously so she is making progress.  We again reviewed neuropathy issues and she is aware of the need to alert [place] if she starts getting any numbness or tingling in her toe tips or fingertips.  She is scheduled for treatment through the end of December and she does not mind being treated 08/06/2019.  Her chemotherapy of course will go into January and I will set her up for some of those appointments today  They know to call for any other issue that may develop before the next scheduled visit. [name]. [name], MD  07/17/19 10:56 AM Medical Oncology and Hematology [facility] [place], [place] [address] Tel. [phone]    Fax. [phone]    I, Wilburn Mylar, am acting as scribe for Dr. [name]. [name].  I, Lurline Del MD, have reviewed the above documentation for accuracy and completeness, and I agree with the above.

## 2019-07-16 NOTE — Progress Notes (Signed)
    Subjective:  Megan Khan is a 63 y.o. female who presents to the [facility] today with a chief complaint of checkup status post lumpectomy.   HPI: Diabetes mellitus, type 2 ([place]) Patient with A1c down to 8.9 from high of 11, she is also had most recent history of lumpectomy and starting chemoradiation for breast cancer.  She says that she has been doing well.  Thinks she has a good appetite but has been trying to eat a little healthier, is consistent with her medication.  Malignant neoplasm of upper-outer quadrant of right breast in female, estrogen receptor negative ([place]) Status post lumpectomy, scar is closed and healing well with no sign of infection.  She did have a little bit of tenderness right at the incision point when she was doing some housework around the house.  She says she was "told not to but did it anyway "and is since agreed to start trying to follow her activity level instructions a little more clearly.  Objective:  Physical Exam: BP 120/80   Pulse 72   Temp 97.7 F (36.5 C) (Oral)   Wt 160 lb (72.6 kg)   SpO2 97%   BMI 29.74 kg/m   Gen: NAD, conversing comfortably CV: RRR with no murmurs appreciated Pulm: NWOB, CTAB with no crackles, wheezes, or rhonchi MSK: no edema, cyanosis, or clubbing noted Skin: warm, dry Neuro: grossly normal, moves all extremities Psych: Normal affect and thought content  Results for orders placed or performed in visit on 07/15/19 (from the past 72 hour(s))  HgB A1c     Status: Abnormal   Collection Time: 07/15/19  9:07 AM  Result Value Ref Range   Hemoglobin A1C     HbA1c POC (<> result, manual entry)     HbA1c, POC (prediabetic range)     HbA1c, POC (controlled diabetic range) 8.9 (A) 0.0 - 7.0 %     Assessment/Plan:  Diabetes mellitus, type 2 (HCC) Patient with A1c down to 8.9 from high of 11, she is also had most recent history of lumpectomy and starting chemoradiation for breast cancer.  She says that she has been doing  well.  Thinks she has a good appetite but has been trying to eat a little healthier, is consistent with her medication.  No changes at this time  Malignant neoplasm of upper-outer quadrant of right breast in female, estrogen receptor negative ([place]) Status post lumpectomy, scar is closed and healing well with no sign of infection.  She did have a little bit of tenderness right at the incision point when she was doing some housework around the house.  She says she was "told not to but did it anyway "and is since agreed to start trying to follow her activity level instructions a little more clearly.   [name], DO FAMILY MEDICINE RESIDENT - PGY3 07/16/2019 5:11 PM

## 2019-07-16 NOTE — Assessment & Plan Note (Signed)
Status post lumpectomy, scar is closed and healing well with no sign of infection.  She did have a little bit of tenderness right at the incision point when she was doing some housework around the house.  She says she was "told not to but did it anyway "and is since agreed to start trying to follow her activity level instructions a little more clearly.

## 2019-07-17 ENCOUNTER — Inpatient Hospital Stay (HOSPITAL_BASED_OUTPATIENT_CLINIC_OR_DEPARTMENT_OTHER): Payer: Medicare Other | Admitting: Oncology

## 2019-07-17 ENCOUNTER — Inpatient Hospital Stay: Payer: Medicare Other

## 2019-07-17 ENCOUNTER — Inpatient Hospital Stay: Payer: Medicare Other | Attending: Oncology

## 2019-07-17 ENCOUNTER — Other Ambulatory Visit: Payer: Self-pay

## 2019-07-17 ENCOUNTER — Other Ambulatory Visit: Payer: Self-pay | Admitting: *Deleted

## 2019-07-17 VITALS — BP 144/84 | HR 66 | Temp 98.7°F | Resp 18 | Ht 61.5 in | Wt 159.1 lb

## 2019-07-17 DIAGNOSIS — C50411 Malignant neoplasm of upper-outer quadrant of right female breast: Secondary | ICD-10-CM | POA: Diagnosis present

## 2019-07-17 DIAGNOSIS — Z171 Estrogen receptor negative status [ER-]: Secondary | ICD-10-CM | POA: Insufficient documentation

## 2019-07-17 DIAGNOSIS — Z5111 Encounter for antineoplastic chemotherapy: Secondary | ICD-10-CM | POA: Diagnosis present

## 2019-07-17 DIAGNOSIS — K429 Umbilical hernia without obstruction or gangrene: Secondary | ICD-10-CM

## 2019-07-17 DIAGNOSIS — R197 Diarrhea, unspecified: Secondary | ICD-10-CM | POA: Diagnosis not present

## 2019-07-17 DIAGNOSIS — I10 Essential (primary) hypertension: Secondary | ICD-10-CM

## 2019-07-17 DIAGNOSIS — M19011 Primary osteoarthritis, right shoulder: Secondary | ICD-10-CM | POA: Diagnosis not present

## 2019-07-17 DIAGNOSIS — Z5112 Encounter for antineoplastic immunotherapy: Secondary | ICD-10-CM | POA: Diagnosis present

## 2019-07-17 DIAGNOSIS — M19012 Primary osteoarthritis, left shoulder: Secondary | ICD-10-CM

## 2019-07-17 DIAGNOSIS — Z95828 Presence of other vascular implants and grafts: Secondary | ICD-10-CM

## 2019-07-17 LAB — CBC WITH DIFFERENTIAL (CANCER CENTER ONLY)
Abs Immature Granulocytes: 0.03 10*3/uL (ref 0.00–0.07)
Basophils Absolute: 0.1 10*3/uL (ref 0.0–0.1)
Basophils Relative: 1 %
Eosinophils Absolute: 0.3 10*3/uL (ref 0.0–0.5)
Eosinophils Relative: 6 %
HCT: 37.3 % (ref 36.0–46.0)
Hemoglobin: 11.9 g/dL — ABNORMAL LOW (ref 12.0–15.0)
Immature Granulocytes: 1 %
Lymphocytes Relative: 44 %
Lymphs Abs: 2.3 10*3/uL (ref 0.7–4.0)
MCH: 26.6 pg (ref 26.0–34.0)
MCHC: 31.9 g/dL (ref 30.0–36.0)
MCV: 83.3 fL (ref 80.0–100.0)
Monocytes Absolute: 0.4 10*3/uL (ref 0.1–1.0)
Monocytes Relative: 7 %
Neutro Abs: 2.2 10*3/uL (ref 1.7–7.7)
Neutrophils Relative %: 41 %
Platelet Count: 280 10*3/uL (ref 150–400)
RBC: 4.48 MIL/uL (ref 3.87–5.11)
RDW: 13.6 % (ref 11.5–15.5)
WBC Count: 5.2 10*3/uL (ref 4.0–10.5)
nRBC: 0 % (ref 0.0–0.2)

## 2019-07-17 LAB — CMP (CANCER CENTER ONLY)
ALT: 31 U/L (ref 0–44)
AST: 17 U/L (ref 15–41)
Albumin: 4 g/dL (ref 3.5–5.0)
Alkaline Phosphatase: 71 U/L (ref 38–126)
Anion gap: 10 (ref 5–15)
BUN: 13 mg/dL (ref 8–23)
CO2: 27 mmol/L (ref 22–32)
Calcium: 9 mg/dL (ref 8.9–10.3)
Chloride: 104 mmol/L (ref 98–111)
Creatinine: 0.81 mg/dL (ref 0.44–1.00)
GFR, Est AFR Am: >60 mL/min (ref >60)
GFR, Estimated: >60 mL/min (ref >60)
Glucose, Bld: 201 mg/dL — ABNORMAL HIGH (ref 70–99)
Potassium: 3.7 mmol/L (ref 3.5–5.1)
Sodium: 141 mmol/L (ref 135–145)
Total Bilirubin: 0.3 mg/dL (ref 0.3–1.2)
Total Protein: 6.7 g/dL (ref 6.5–8.1)

## 2019-07-17 MED ORDER — PROCHLORPERAZINE EDISYLATE 10 MG/2ML IJ SOLN
10.0000 mg | Freq: Once | INTRAMUSCULAR | Status: AC
  Administered 2019-07-17: 10 mg via INTRAVENOUS

## 2019-07-17 MED ORDER — PROCHLORPERAZINE EDISYLATE 10 MG/2ML IJ SOLN
INTRAMUSCULAR | Status: AC
  Filled 2019-07-17: qty 2

## 2019-07-17 MED ORDER — ACETAMINOPHEN 325 MG PO TABS
ORAL_TABLET | ORAL | Status: AC
  Filled 2019-07-17: qty 2

## 2019-07-17 MED ORDER — SODIUM CHLORIDE 0.9 % IV SOLN
Freq: Once | INTRAVENOUS | Status: AC
  Administered 2019-07-17: 11:00:00 via INTRAVENOUS
  Filled 2019-07-17: qty 250

## 2019-07-17 MED ORDER — HEPARIN SOD (PORK) LOCK FLUSH 100 UNIT/ML IV SOLN
500.0000 [IU] | Freq: Once | INTRAVENOUS | Status: AC | PRN
  Administered 2019-07-17: 500 [IU]
  Filled 2019-07-17: qty 5

## 2019-07-17 MED ORDER — DIPHENHYDRAMINE HCL 50 MG/ML IJ SOLN
25.0000 mg | Freq: Once | INTRAMUSCULAR | Status: AC
  Administered 2019-07-17: 25 mg via INTRAVENOUS

## 2019-07-17 MED ORDER — SODIUM CHLORIDE 0.9% FLUSH
10.0000 mL | INTRAVENOUS | Status: DC | PRN
  Administered 2019-07-17: 10 mL via INTRAVENOUS
  Filled 2019-07-17: qty 10

## 2019-07-17 MED ORDER — DIPHENHYDRAMINE HCL 50 MG/ML IJ SOLN
INTRAMUSCULAR | Status: AC
  Filled 2019-07-17: qty 1

## 2019-07-17 MED ORDER — PACLITAXEL PROTEIN-BOUND CHEMO INJECTION 100 MG
100.0000 mg/m2 | Freq: Once | INTRAVENOUS | Status: AC
  Administered 2019-07-17: 175 mg via INTRAVENOUS
  Filled 2019-07-17: qty 35

## 2019-07-17 MED ORDER — ACETAMINOPHEN 325 MG PO TABS
650.0000 mg | ORAL_TABLET | Freq: Once | ORAL | Status: AC
  Administered 2019-07-17: 650 mg via ORAL

## 2019-07-17 MED ORDER — TRASTUZUMAB-DKST CHEMO 150 MG IV SOLR
150.0000 mg | Freq: Once | INTRAVENOUS | Status: AC
  Administered 2019-07-17: 150 mg via INTRAVENOUS
  Filled 2019-07-17: qty 7.14

## 2019-07-17 MED ORDER — SODIUM CHLORIDE 0.9% FLUSH
10.0000 mL | INTRAVENOUS | Status: DC | PRN
  Administered 2019-07-17: 10 mL
  Filled 2019-07-17: qty 10

## 2019-07-17 MED ORDER — FAMOTIDINE IN NACL 20-0.9 MG/50ML-% IV SOLN
INTRAVENOUS | Status: AC
  Filled 2019-07-17: qty 50

## 2019-07-17 MED ORDER — FAMOTIDINE IN NACL 20-0.9 MG/50ML-% IV SOLN
20.0000 mg | Freq: Once | INTRAVENOUS | Status: AC
  Administered 2019-07-17: 20 mg via INTRAVENOUS

## 2019-07-17 NOTE — Patient Instructions (Signed)
Megan Khan Discharge Instructions for Patients Receiving Chemotherapy  Today you received the following chemotherapy agents: Trastuzumab-dkst (Ogivri) and Paclitaxel protein-bound (Abraxane)  To help prevent nausea and vomiting after your treatment, we encourage you to take your nausea medication as directed.    If you develop nausea and vomiting that is not controlled by your nausea medication, call the clinic.   BELOW ARE SYMPTOMS THAT SHOULD BE REPORTED IMMEDIATELY:  *FEVER GREATER THAN 100.5 F  *CHILLS WITH OR WITHOUT FEVER  NAUSEA AND VOMITING THAT IS NOT CONTROLLED WITH YOUR NAUSEA MEDICATION  *UNUSUAL SHORTNESS OF BREATH  *UNUSUAL BRUISING OR BLEEDING  TENDERNESS IN MOUTH AND THROAT WITH OR WITHOUT PRESENCE OF ULCERS  *URINARY PROBLEMS  *BOWEL PROBLEMS  UNUSUAL RASH Items with * indicate a potential emergency and should be followed up as soon as possible.  Feel free to call the clinic should you have any questions or concerns. The clinic phone number is [phone].  Please show the [place] at check-in to the Emergency Department and triage nurse.  Coronavirus (COVID-19) Are you at risk?  Are you at risk for the Coronavirus (COVID-19)?  To be considered HIGH RISK for Coronavirus (COVID-19), you have to meet the following criteria:  . Traveled to [place], [place], [place], [place] or [place]; or in the [place] to [place], [place], [place], or [place]; and have fever, cough, and shortness of breath within the last 2 weeks of travel OR . Been in close contact with a person diagnosed with COVID-19 within the last 2 weeks and have fever, cough, and shortness of breath . IF YOU DO NOT MEET THESE CRITERIA, YOU ARE CONSIDERED LOW RISK FOR COVID-19.  What to do if you are HIGH RISK for COVID-19?  Marland Kitchen If you are having a medical emergency, call 911. . Seek medical care right away. Before you go to a doctor's office, urgent care or  emergency department, call ahead and tell them about your recent travel, contact with someone diagnosed with COVID-19, and your symptoms. You should receive instructions from your physician's office regarding next steps of care.  . When you arrive at healthcare provider, tell the healthcare staff immediately you have returned from visiting [place], [place], [place], [place] or [place]; or traveled in the [place] to [place], [place], [place], or [place]; in the last two weeks or you have been in close contact with a person diagnosed with COVID-19 in the last 2 weeks.   . Tell the health care staff about your symptoms: fever, cough and shortness of breath. . After you have been seen by a medical provider, you will be either: o Tested for (COVID-19) and discharged home on quarantine except to seek medical care if symptoms worsen, and asked to  - Stay home and avoid contact with others until you get your results (4-5 days)  - Avoid travel on public transportation if possible (such as bus, train, or airplane) or o Sent to the Emergency Department by EMS for evaluation, COVID-19 testing, and possible admission depending on your condition and test results.  What to do if you are LOW RISK for COVID-19?  Reduce your risk of any infection by using the same precautions used for avoiding the common cold or flu:  Marland Kitchen Wash your hands often with soap and warm water for at least 20 seconds.  If soap and water are not readily available, use an alcohol-based hand sanitizer with at least 60% alcohol.  Marland Kitchen  If coughing or sneezing, cover your mouth and nose by coughing or sneezing into the elbow areas of your shirt or coat, into a tissue or into your sleeve (not your hands). . Avoid shaking hands with others and consider head nods or verbal greetings only. . Avoid touching your eyes, nose, or mouth with unwashed hands.  . Avoid close contact with people who are sick. . Avoid places or events with large numbers  of people in one location, like concerts or sporting events. . Carefully consider travel plans you have or are making. . If you are planning any travel outside or inside the [place], visit the CDC's Travelers' Health webpage for the latest health notices. . If you have some symptoms but not all symptoms, continue to monitor at home and seek medical attention if your symptoms worsen. . If you are having a medical emergency, call 911.   [place] / e-Visit: [email or web address]         [facility]: [facility]: [phone]                   [facility]: [phone]

## 2019-07-20 ENCOUNTER — Other Ambulatory Visit: Payer: Self-pay | Admitting: *Deleted

## 2019-07-20 DIAGNOSIS — G8929 Other chronic pain: Secondary | ICD-10-CM

## 2019-07-20 DIAGNOSIS — M25511 Pain in right shoulder: Secondary | ICD-10-CM

## 2019-07-21 MED ORDER — METOPROLOL SUCCINATE ER 50 MG PO TB24: ORAL_TABLET | ORAL | 0 refills | Status: DC

## 2019-07-21 MED ORDER — GABAPENTIN 100 MG PO CAPS: 200.0000 mg | ORAL_CAPSULE | Freq: Three times a day (TID) | ORAL | 0 refills | Status: DC

## 2019-07-22 ENCOUNTER — Other Ambulatory Visit: Payer: Medicare Other

## 2019-07-22 ENCOUNTER — Ambulatory Visit: Payer: Medicare Other | Admitting: Adult Health

## 2019-07-22 ENCOUNTER — Other Ambulatory Visit: Payer: Self-pay

## 2019-07-22 ENCOUNTER — Ambulatory Visit (HOSPITAL_BASED_OUTPATIENT_CLINIC_OR_DEPARTMENT_OTHER)
Admission: RE | Admit: 2019-07-22 | Discharge: 2019-07-22 | Disposition: A | Discharge status: Home / Self Care | Payer: Medicare Other | Source: Ambulatory Visit | Attending: Internal Medicine | Admitting: Internal Medicine

## 2019-07-22 ENCOUNTER — Ambulatory Visit: Payer: Medicare Other

## 2019-07-22 ENCOUNTER — Ambulatory Visit (HOSPITAL_COMMUNITY)
Admission: RE | Admit: 2019-07-22 | Discharge: 2019-07-22 | Disposition: A | Discharge status: Home / Self Care | Payer: Medicare Other | Source: Ambulatory Visit | Attending: Internal Medicine | Admitting: Internal Medicine

## 2019-07-22 ENCOUNTER — Encounter (HOSPITAL_COMMUNITY): Payer: Self-pay | Admitting: Internal Medicine

## 2019-07-22 VITALS — BP 121/68 | HR 73 | Wt 160.0 lb

## 2019-07-22 DIAGNOSIS — E785 Hyperlipidemia, unspecified: Secondary | ICD-10-CM | POA: Insufficient documentation

## 2019-07-22 DIAGNOSIS — C50411 Malignant neoplasm of upper-outer quadrant of right female breast: Secondary | ICD-10-CM | POA: Insufficient documentation

## 2019-07-22 DIAGNOSIS — I10 Essential (primary) hypertension: Secondary | ICD-10-CM

## 2019-07-22 DIAGNOSIS — Z171 Estrogen receptor negative status [ER-]: Secondary | ICD-10-CM | POA: Diagnosis not present

## 2019-07-22 DIAGNOSIS — Z87891 Personal history of nicotine dependence: Secondary | ICD-10-CM | POA: Diagnosis not present

## 2019-07-22 DIAGNOSIS — I340 Nonrheumatic mitral (valve) insufficiency: Secondary | ICD-10-CM | POA: Insufficient documentation

## 2019-07-22 DIAGNOSIS — Z79899 Other long term (current) drug therapy: Secondary | ICD-10-CM | POA: Insufficient documentation

## 2019-07-22 DIAGNOSIS — E119 Type 2 diabetes mellitus without complications: Secondary | ICD-10-CM | POA: Insufficient documentation

## 2019-07-22 DIAGNOSIS — H409 Unspecified glaucoma: Secondary | ICD-10-CM | POA: Diagnosis not present

## 2019-07-22 DIAGNOSIS — I313 Pericardial effusion (noninflammatory): Secondary | ICD-10-CM | POA: Insufficient documentation

## 2019-07-22 DIAGNOSIS — Z7984 Long term (current) use of oral hypoglycemic drugs: Secondary | ICD-10-CM | POA: Diagnosis not present

## 2019-07-22 DIAGNOSIS — Z800 Family history of malignant neoplasm of digestive organs: Secondary | ICD-10-CM | POA: Diagnosis not present

## 2019-07-22 DIAGNOSIS — Z882 Allergy status to sulfonamides status: Secondary | ICD-10-CM | POA: Insufficient documentation

## 2019-07-22 DIAGNOSIS — Z803 Family history of malignant neoplasm of breast: Secondary | ICD-10-CM | POA: Insufficient documentation

## 2019-07-22 NOTE — Patient Instructions (Signed)
Follow up and echo 10/21/2018. Echo at 2pm followup 3 pm  Gate code [phone]

## 2019-07-22 NOTE — Progress Notes (Signed)
*  PRELIMINARY RESULTS* Echocardiogram 2D Echocardiogram has been performed.  Leavy Cella 07/22/2019, 2:55 PM

## 2019-07-22 NOTE — Progress Notes (Signed)
CARDIO-ONCOLOGY CLINIC CONSULT NOTE  Referring Physician: Tressa Busman, MD Primary Care: [name], DO  HPI:  [name] is a  63 y.o. female with right breast cancer referred by Dr. Jana Hakim for enrollment into the Cardio-Oncology program.  She has a h/o HTN, HL, DM2, glaucoma with legal blindness. Denied any known heart disease. Pre-chemo echo 04/06/2019  EF 65-70%.   She is here with her son, Megan Khan who works for this [employer]  Cancer history:   Underwent right breast upper outer quadrant biopsy 03/11/2019 for a clinical t1b N0, stage IA invasive ductal carcinoma, grade 2, estrogen and progesterone receptor negative, HER-2 amplified, with an MIB-1 of 10%.  (1) Right breast lumpectomy on 05/29/2019: pT1b pN0, stage IA invasive ductal carciinoma, grade 2,              (a) a total of 5 lymph nodes were negative for carcinoma             (b) superior margin positive, cleared with addition surgery on 06/10/2019              (c) surgery delayed due to abnormal EKG, patient cleared by Dr. Gwenlyn Found on 04/21/2019  (2) adjuvant chemotherapy to consist of Abraxane weekly x12 with Trastuzumab, starting 07/10/2019             (a) Abraxane given due to diabetes, and inability to tolerate steroid premedication required with Paclitaxel.  (3) trastuzumab to be continued to complete 1 year             (a) baseline echo 04/06/2019 shows an ejection fraction greater than 65%  (4) adjuvant radiation to follow.   Presents today to establish in the cardio-oncology clinic. Feels good. Tolerating chemo and herceptin fairly well. Denies CP or SOB. No edema, orthopnea or PND>   Echo today (07/22/19): EF 65-70% Mild MR. Grade 1 DD GLS -16.7%   Review of Systems: [y] = yes, _0  = no   General: Weight gain _1 ; Weight loss _2 ; Anorexia _3 ; Fatigue _4 ; Fever _5 ; Chills _6 ; Weakness _7   Cardiac: Chest pain/pressure _8 ; Resting SOB _9 ; Exertional SOB _10 ; Orthopnea _11 ; Pedal Edema [  ]; Palpitations _12 ; Syncope _13 ; Presyncope _14 ; Paroxysmal nocturnal dyspnea_15   Pulmonary: Cough _16 ; Wheezing_17 ; Hemoptysis_18 ; Sputum _19 ; Snoring _20   GI: Vomiting_21 ; Dysphagia_22 ; Melena_23 ; Hematochezia _24 ; Heartburn_25 ; Abdominal pain _26 ; Constipation _27 ; Diarrhea _28 ; BRBPR _29   GU: Hematuria_30 ; Dysuria _31 ; Nocturia_32   Vascular: Pain in legs with walking _33 ; Pain in feet with lying flat _34 ; Non-healing sores _35 ; Stroke _36 ; TIA _37 ; Slurred speech _38 ;  Neuro: Headaches_39 ; Vertigo_40 ; Seizures_41 ; Paresthesias_42 ;Blurred vision _43 ; Diplopia _44 ; Vision changes [ y]  Ortho/Skin: Arthritis Blue.Reese ]; Joint pain Blue.Reese ]; Muscle pain _45 ; Joint swelling _46 ; Back Pain _47 ; Rash _48   Psych: Depression_49 ; Anxiety_50   Heme: Bleeding problems _51 ; Clotting disorders _52 ; Anemia _53   Endocrine: Diabetes Blue.Reese ]; Thyroid dysfunction_54    Past Medical History:  Diagnosis Date  . Anemia    during pregnancy  . Cancer [facility]) 2020   breast cancer on right  . Diabetes mellitus    Type 2  . Family history of breast cancer   . Family history  of throat cancer   . Glaucoma   . Glaucoma   . Hx of colonic polyps   . Hyperlipidemia   . Hypertension   . Legally blind    completely blind in left eye, low vision in right    Current Outpatient Medications  Medication Sig Dispense Refill  . amLODipine (NORVASC) 10 MG tablet TAKE 1 TABLET(10 MG) BY MOUTH DAILY 90 tablet 3  . bimatoprost (LUMIGAN) 0.01 % SOLN Place 2 drops into the left eye at bedtime.     . blood glucose meter kit and supplies KIT Dispense based on patient and insurance preference. Use up to four times daily as directed. (FOR ICD-9 250.00, 250.01). 1 each 12  . dorzolamide (TRUSOPT) 2 % ophthalmic solution Place 2 drops into the left eye 3 (three) times daily.     Marland Kitchen gabapentin (NEURONTIN) 100 MG capsule Take 2 capsules (200 mg total) by mouth 3 (three) times daily. 180 capsule 0  . lidocaine-prilocaine (EMLA) cream Apply to  affected area once 30 g 3  . lisinopril-hydrochlorothiazide (ZESTORETIC) 20-25 MG tablet Take 1 tablet by mouth daily. 90 tablet 3  . lovastatin (MEVACOR) 20 MG tablet TAKE 1 TABLET BY MOUTH EVERY NIGHT AT BEDTIME 90 tablet 3  . metFORMIN (GLUCOPHAGE) 1000 MG tablet Take 1 tablet (1,000 mg total) by mouth 2 (two) times daily with a meal. 360 tablet 1  . metoprolol succinate (TOPROL-XL) 50 MG 24 hr tablet TAKE 1 TABLET(50 MG) BY MOUTH DAILY 90 tablet 0  . prochlorperazine (COMPAZINE) 10 MG tablet TAKE 1 TABLET(10 MG) BY MOUTH EVERY 6 HOURS AS NEEDED FOR NAUSEA OR VOMITING 30 tablet 1   No current facility-administered medications for this encounter.     Allergies  Allergen Reactions  . Sulfamethoxazole Rash    REACTION: Questionable allergy ?      Social History   Socioeconomic History  . Marital status: Single    Spouse name: Not on file  . Number of children: Not on file  . Years of education: Not on file  . Highest education level: Not on file  Occupational History  . Not on file  Social Needs  . Financial resource strain: Not on file  . Food insecurity    Worry: Not on file    Inability: Not on file  . Transportation needs    Medical: No    Non-medical: No  Tobacco Use  . Smoking status: Former Smoker    Types: Cigarettes    Quit date: 09/23/1995    Years since quitting: 23.8  . Smokeless tobacco: Never Used  Substance and Sexual Activity  . Alcohol use: No  . Drug use: No  . Sexual activity: Not on file  Lifestyle  . Physical activity    Days per week: Not on file    Minutes per session: Not on file  . Stress: Not on file  Relationships  . Social Herbalist on phone: Not on file    Gets together: Not on file    Attends religious service: Not on file    Active member of club or organization: Not on file    Attends meetings of clubs or organizations: Not on file    Relationship status: Not on file  . Intimate partner violence    Fear of current or  ex partner: Not on file    Emotionally abused: Not on file    Physically abused: Not on file    Forced sexual activity:  Not on file  Other Topics Concern  . Not on file  Social History Narrative  . Not on file      Family History  Problem Relation Age of Onset  . Breast cancer Sister 1       Her2 +  . Breast cancer Cousin        maternal second cousin, Bilateral mastectomy, diagnosed in her 27s    Vitals:   07/22/19 1521  BP: 121/68  Pulse: 73  SpO2: 98%  Weight: 72.6 kg (160 lb)    PHYSICAL EXAM: General:  Well appearing. No respiratory difficulty HEENT: normal Neck: supple. no JVD. Carotids 2+ bilat; no bruits. No lymphadenopathy or thryomegaly appreciated. Cor: PMI nondisplaced. Regular rate & rhythm. No rubs, gallops or murmurs. Lungs: clear Abdomen: soft, nontender, nondistended. No hepatosplenomegaly. No bruits or masses. Good bowel sounds. Extremities: no cyanosis, clubbing, rash, edema Neuro: alert & oriented x 3, cranial nerves grossly intact. moves all 4 extremities w/o difficulty. Affect pleasant.   ASSESSMENT & PLAN:  1. Right breast cancer  - Echo today EF 65-70% Personally reviewed - Explained incidence of Herceptin cardiotoxicity and role of Cardio-oncology clinic at length. Echo images reviewed personally. All parameters stable. Reviewed signs and symptoms of HF to look for. Continue Herceptin. Follow-up with echo in 3 months.  2. HTN  - Blood pressure well controlled. Continue current regimen.    Glori Bickers, MD  11:30 PM

## 2019-07-23 ENCOUNTER — Inpatient Hospital Stay: Payer: Medicare Other

## 2019-07-23 ENCOUNTER — Encounter: Payer: Self-pay | Admitting: Adult Health

## 2019-07-23 ENCOUNTER — Inpatient Hospital Stay (HOSPITAL_BASED_OUTPATIENT_CLINIC_OR_DEPARTMENT_OTHER): Payer: Medicare Other | Admitting: Adult Health

## 2019-07-23 ENCOUNTER — Other Ambulatory Visit: Payer: Self-pay

## 2019-07-23 VITALS — BP 139/88 | HR 65 | Temp 98.3°F | Resp 17 | Ht 61.5 in | Wt 158.1 lb

## 2019-07-23 DIAGNOSIS — C50411 Malignant neoplasm of upper-outer quadrant of right female breast: Secondary | ICD-10-CM

## 2019-07-23 DIAGNOSIS — Z5112 Encounter for antineoplastic immunotherapy: Secondary | ICD-10-CM | POA: Diagnosis not present

## 2019-07-23 DIAGNOSIS — Z171 Estrogen receptor negative status [ER-]: Secondary | ICD-10-CM

## 2019-07-23 DIAGNOSIS — Z95828 Presence of other vascular implants and grafts: Secondary | ICD-10-CM

## 2019-07-23 LAB — CMP (CANCER CENTER ONLY)
ALT: 31 U/L (ref 0–44)
AST: 17 U/L (ref 15–41)
Albumin: 4.1 g/dL (ref 3.5–5.0)
Alkaline Phosphatase: 73 U/L (ref 38–126)
Anion gap: 8 (ref 5–15)
BUN: 11 mg/dL (ref 8–23)
CO2: 27 mmol/L (ref 22–32)
Calcium: 9 mg/dL (ref 8.9–10.3)
Chloride: 105 mmol/L (ref 98–111)
Creatinine: 0.8 mg/dL (ref 0.44–1.00)
GFR, Est AFR Am: >60 mL/min (ref >60)
GFR, Estimated: >60 mL/min (ref >60)
Glucose, Bld: 148 mg/dL — ABNORMAL HIGH (ref 70–99)
Potassium: 3.5 mmol/L (ref 3.5–5.1)
Sodium: 140 mmol/L (ref 135–145)
Total Bilirubin: 0.3 mg/dL (ref 0.3–1.2)
Total Protein: 6.7 g/dL (ref 6.5–8.1)

## 2019-07-23 LAB — CBC WITH DIFFERENTIAL (CANCER CENTER ONLY)
Abs Immature Granulocytes: 0.02 10*3/uL (ref 0.00–0.07)
Basophils Absolute: 0.1 10*3/uL (ref 0.0–0.1)
Basophils Relative: 2 %
Eosinophils Absolute: 0.2 10*3/uL (ref 0.0–0.5)
Eosinophils Relative: 4 %
HCT: 37 % (ref 36.0–46.0)
Hemoglobin: 11.8 g/dL — ABNORMAL LOW (ref 12.0–15.0)
Immature Granulocytes: 0 %
Lymphocytes Relative: 47 %
Lymphs Abs: 2.2 10*3/uL (ref 0.7–4.0)
MCH: 26.9 pg (ref 26.0–34.0)
MCHC: 31.9 g/dL (ref 30.0–36.0)
MCV: 84.5 fL (ref 80.0–100.0)
Monocytes Absolute: 0.3 10*3/uL (ref 0.1–1.0)
Monocytes Relative: 7 %
Neutro Abs: 1.9 10*3/uL (ref 1.7–7.7)
Neutrophils Relative %: 40 %
Platelet Count: 332 10*3/uL (ref 150–400)
RBC: 4.38 MIL/uL (ref 3.87–5.11)
RDW: 13.8 % (ref 11.5–15.5)
WBC Count: 4.7 10*3/uL (ref 4.0–10.5)
nRBC: 0 % (ref 0.0–0.2)

## 2019-07-23 MED ORDER — SODIUM CHLORIDE 0.9% FLUSH
10.0000 mL | INTRAVENOUS | Status: DC | PRN
  Administered 2019-07-23: 10 mL via INTRAVENOUS
  Filled 2019-07-23: qty 10

## 2019-07-23 MED ORDER — HEPARIN SOD (PORK) LOCK FLUSH 100 UNIT/ML IV SOLN
500.0000 [IU] | Freq: Once | INTRAVENOUS | Status: AC | PRN
  Administered 2019-07-23: 500 [IU]
  Filled 2019-07-23: qty 5

## 2019-07-23 MED ORDER — SODIUM CHLORIDE 0.9 % IV SOLN
Freq: Once | INTRAVENOUS | Status: AC
  Administered 2019-07-23: 14:00:00 via INTRAVENOUS
  Filled 2019-07-23: qty 250

## 2019-07-23 MED ORDER — PROCHLORPERAZINE EDISYLATE 10 MG/2ML IJ SOLN
INTRAMUSCULAR | Status: AC
  Filled 2019-07-23: qty 2

## 2019-07-23 MED ORDER — TRASTUZUMAB-DKST CHEMO 150 MG IV SOLR
150.0000 mg | Freq: Once | INTRAVENOUS | Status: AC
  Administered 2019-07-23: 15:00:00 150 mg via INTRAVENOUS
  Filled 2019-07-23: qty 7.14

## 2019-07-23 MED ORDER — PROCHLORPERAZINE EDISYLATE 10 MG/2ML IJ SOLN
10.0000 mg | Freq: Once | INTRAMUSCULAR | Status: AC
  Administered 2019-07-23: 10 mg via INTRAVENOUS

## 2019-07-23 MED ORDER — ACETAMINOPHEN 325 MG PO TABS
ORAL_TABLET | ORAL | Status: AC
  Filled 2019-07-23: qty 2

## 2019-07-23 MED ORDER — DIPHENHYDRAMINE HCL 50 MG/ML IJ SOLN
INTRAMUSCULAR | Status: AC
  Filled 2019-07-23: qty 1

## 2019-07-23 MED ORDER — PACLITAXEL PROTEIN-BOUND CHEMO INJECTION 100 MG
100.0000 mg/m2 | Freq: Once | INTRAVENOUS | Status: AC
  Administered 2019-07-23: 175 mg via INTRAVENOUS
  Filled 2019-07-23: qty 35

## 2019-07-23 MED ORDER — SODIUM CHLORIDE 0.9% FLUSH
10.0000 mL | INTRAVENOUS | Status: DC | PRN
  Administered 2019-07-23: 10 mL
  Filled 2019-07-23: qty 10

## 2019-07-23 MED ORDER — FAMOTIDINE IN NACL 20-0.9 MG/50ML-% IV SOLN
20.0000 mg | Freq: Once | INTRAVENOUS | Status: AC
  Administered 2019-07-23: 20 mg via INTRAVENOUS

## 2019-07-23 MED ORDER — FAMOTIDINE IN NACL 20-0.9 MG/50ML-% IV SOLN
INTRAVENOUS | Status: AC
  Filled 2019-07-23: qty 50

## 2019-07-23 MED ORDER — ACETAMINOPHEN 325 MG PO TABS
650.0000 mg | ORAL_TABLET | Freq: Once | ORAL | Status: AC
  Administered 2019-07-23: 650 mg via ORAL

## 2019-07-23 MED ORDER — DIPHENHYDRAMINE HCL 50 MG/ML IJ SOLN
25.0000 mg | Freq: Once | INTRAMUSCULAR | Status: AC
  Administered 2019-07-23: 25 mg via INTRAVENOUS

## 2019-07-23 NOTE — Progress Notes (Signed)
Staff message sent to Dr. Jana Hakim to sign attestation of consent for current treatment plan

## 2019-07-23 NOTE — Patient Instructions (Signed)
Megan Khan Discharge Instructions for Patients Receiving Chemotherapy  Today you received the following chemotherapy agents: Trastuzumab-dkst (Ogivri) and Paclitaxel protein-bound (Abraxane)  To help prevent nausea and vomiting after your treatment, we encourage you to take your nausea medication as directed.    If you develop nausea and vomiting that is not controlled by your nausea medication, call the clinic.   BELOW ARE SYMPTOMS THAT SHOULD BE REPORTED IMMEDIATELY:  *FEVER GREATER THAN 100.5 F  *CHILLS WITH OR WITHOUT FEVER  NAUSEA AND VOMITING THAT IS NOT CONTROLLED WITH YOUR NAUSEA MEDICATION  *UNUSUAL SHORTNESS OF BREATH  *UNUSUAL BRUISING OR BLEEDING  TENDERNESS IN MOUTH AND THROAT WITH OR WITHOUT PRESENCE OF ULCERS  *URINARY PROBLEMS  *BOWEL PROBLEMS  UNUSUAL RASH Items with * indicate a potential emergency and should be followed up as soon as possible.  Feel free to call the clinic should you have any questions or concerns. The clinic phone number is [phone].  Please show the [place] at check-in to the Emergency Department and triage nurse.  Coronavirus (COVID-19) Are you at risk?  Are you at risk for the Coronavirus (COVID-19)?  To be considered HIGH RISK for Coronavirus (COVID-19), you have to meet the following criteria:  . Traveled to [place], [place], [place], [place] or [place]; or in the [place] to [place], [place], [place], or [place]; and have fever, cough, and shortness of breath within the last 2 weeks of travel OR . Been in close contact with a person diagnosed with COVID-19 within the last 2 weeks and have fever, cough, and shortness of breath . IF YOU DO NOT MEET THESE CRITERIA, YOU ARE CONSIDERED LOW RISK FOR COVID-19.  What to do if you are HIGH RISK for COVID-19?  Marland Kitchen If you are having a medical emergency, call 911. . Seek medical care right away. Before you go to a doctor's office, urgent care or  emergency department, call ahead and tell them about your recent travel, contact with someone diagnosed with COVID-19, and your symptoms. You should receive instructions from your physician's office regarding next steps of care.  . When you arrive at healthcare provider, tell the healthcare staff immediately you have returned from visiting [place], [place], [place], [place] or [place]; or traveled in the [place] to [place], [place], [place], or [place]; in the last two weeks or you have been in close contact with a person diagnosed with COVID-19 in the last 2 weeks.   . Tell the health care staff about your symptoms: fever, cough and shortness of breath. . After you have been seen by a medical provider, you will be either: o Tested for (COVID-19) and discharged home on quarantine except to seek medical care if symptoms worsen, and asked to  - Stay home and avoid contact with others until you get your results (4-5 days)  - Avoid travel on public transportation if possible (such as bus, train, or airplane) or o Sent to the Emergency Department by EMS for evaluation, COVID-19 testing, and possible admission depending on your condition and test results.  What to do if you are LOW RISK for COVID-19?  Reduce your risk of any infection by using the same precautions used for avoiding the common cold or flu:  Marland Kitchen Wash your hands often with soap and warm water for at least 20 seconds.  If soap and water are not readily available, use an alcohol-based hand sanitizer with at least 60% alcohol.  Marland Kitchen  If coughing or sneezing, cover your mouth and nose by coughing or sneezing into the elbow areas of your shirt or coat, into a tissue or into your sleeve (not your hands). . Avoid shaking hands with others and consider head nods or verbal greetings only. . Avoid touching your eyes, nose, or mouth with unwashed hands.  . Avoid close contact with people who are sick. . Avoid places or events with large numbers  of people in one location, like concerts or sporting events. . Carefully consider travel plans you have or are making. . If you are planning any travel outside or inside the [place], visit the CDC's Travelers' Health webpage for the latest health notices. . If you have some symptoms but not all symptoms, continue to monitor at home and seek medical attention if your symptoms worsen. . If you are having a medical emergency, call 911.   [place] / e-Visit: [email or web address]         [facility]: [facility]: [phone]                   [facility]: [phone]

## 2019-07-23 NOTE — Progress Notes (Signed)
[place]  [phone] [phone]    ID: Megan Khan DOB: Aug 29, 1955  MR#: [id number]  VAP#:[id number]  Patient Care Team: Megan Sires, DO as PCP - General Khan, Megan Pascal, MD as PCP - Advanced Heart Failure (Cardiology) Khan, Megan Dad, MD as Consulting Physician (Oncology) Megan Skates, MD as Consulting Physician (General Surgery) Megan Rudd, MD as Consulting Physician (Radiation Oncology) Megan Khan, DPM as Consulting Physician (Podiatry) Megan Pearson, MD as Consulting Physician (Ophthalmology) Megan Horner, MD as Consulting Physician (Gastroenterology) Megan Dresser, MD as Consulting Physician (Cardiology) Megan Kaufmann, RN as Oncology Nurse Navigator Megan Germany, RN as Oncology Nurse Navigator OTHER MD:   CHIEF COMPLAINT: Estrogen receptor negative breast cancer  CURRENT TREATMENT: Abraxane/Trastuzumab   INTERVAL HISTORY: Megan Khan is here for follow up of her HER-2 positive breast cancer accompanied by her son.  She began adjuvant chemotherapy with Abraxane and Trastuzumab (ogivri which is biosimilar) on 07/10/2019. She will receive this weekly x 12 weeks, and then will go onto receive Trastuzumab every 3 weeks to complete1 year of therapy.    Today is day 1 cycle 4 of her chemoimmunotherapy.  She notes that she is tolerating her treatment moderately well.  She has no peripheral neuropathy.  Her most recent echocardiogram was on 07/22/2019 and showed an EF of 65-70%.   REVIEW OF SYSTEMS: Megan Khan has had diarrhea that started a couple of days after she received her treatment and lasted for two days, then it started to decrease.  She notes that she had diarrhea every time she ate or drank something.  She did not take anything for it.  She denies any blood, mucous, pus.  She had slight abdominal cramping with this, and no fever, chills, abdominal pain, nausea, vomiting.  She has had no chest pain, palpitations,  cough, shortness of breath.  A detailed ROS was otherwise non contributory.    HISTORY OF CURRENT ILLNESS: From the original intake note:  Megan Khan had routine screening mammography on 02/24/2019 showing a possible abnormality in the right breast. She underwent unilateral right diagnostic mammography with tomography and right breast ultrasonography at [facility] on 03/04/2019 showing: Breast Density Category B. Additional imaging of the right breast was performed. There is a persistent asymmetry and distortion in the middle third of the upper-outer quadrant of the breast. There are no malignant type microcalcifications. On physical exam, I do not palpate a mass in the upper-outer quadrant of the right breast. Targeted ultrasound is performed, showing an irregular hypoechoic mass in the right breast 11 o'clock 7 cm from the nipple measuring 0.7 x 0.7 x 1.0 cm. Sonographic evaluation of the right axilla does not show any enlarged adenopathy.  Accordingly on 03/11/2019 she proceeded to biopsy of the right breast area in question. The pathology from this procedure showed ([id number]): invasive ductal carcinoma, grade II, 11 o'clock 7 cm from the nipple. Prognostic indicators significant for: estrogen receptor, 0% negative and progesterone receptor, 0% negative. Proliferation marker Ki67 at 10%. HER2 positive (3+) by immunohistochemistry.  The patient's subsequent history is as detailed below.    PAST MEDICAL HISTORY: Past Medical History:  Diagnosis Date   Anemia    during pregnancy   Cancer ([place]) 2020   breast cancer on right   Diabetes mellitus    Type 2   Family history of breast cancer    Family history of throat cancer    Glaucoma    Glaucoma  Hx of colonic polyps    Hyperlipidemia    Hypertension    Legally blind    completely blind in left eye, low vision in right    PAST SURGICAL HISTORY: Past Surgical History:  Procedure Laterality Date   BREAST  LUMPECTOMY WITH RADIOACTIVE SEED AND SENTINEL LYMPH NODE BIOPSY Right 05/29/2019   Procedure: RIGHT BREAST LUMPECTOMY WITH RADIOACTIVE SEED AND RIGHT DEEP AXILLARY SENTINEL LYMPH NODE BIOPSY INJECT BLUE DYE;  Surgeon: Megan Skates, MD;  Location: [facility];  Service: General;  Laterality: Right;   COLONOSCOPY  01/22/2012   Procedure: COLONOSCOPY;  Surgeon: Megan Horner, MD;  Location: [facility];  Service: Endoscopy;  Laterality: N/A;   COLONOSCOPY     COLONOSCOPY     PORTACATH PLACEMENT N/A 05/29/2019   Procedure: INSERTION PORT-A-CATH WITH ULTRASOUND;  Surgeon: Megan Skates, MD;  Location: [facility];  Service: General;  Laterality: N/A;   RE-EXCISION OF BREAST LUMPECTOMY Right 06/10/2019   Procedure: RE-EXCISION OF RIGHT BREAST LUMPECTOMY MARGINS;  Surgeon: Megan Skates, MD;  Location: [facility];  Service: General;  Laterality: Right;   TUBAL LIGATION       FAMILY HISTORY: Family History  Problem Relation Age of Onset   Breast cancer Sister 59       Her2 +   Breast cancer Cousin        maternal second cousin, Bilateral mastectomy, diagnosed in her 42s   Caily's father died from natural causes at age 23. Patients' mother died from natural causes at age 12. The patient has 5 brothers and 3 sisters.  The patient's sister, Megan Khan, is my patient diagnosed with breast cancer at 89.  The cyst cancer was estrogen receptor positive, HER-2 negative.  She received no chemo.  Mckinzey's maternal 2nd cousin was diagnosed with breast cancer. Patient denies anyone in her family having ovarian, prostate, or pancreatic cancer.    GYNECOLOGIC HISTORY:  No LMP recorded. Patient is postmenopausal. Menarche: 63 years old Age at first live birth: 63 years old GX P: 3 LMP: 104 Contraceptive: no HRT: no  Hysterectomy?: no BSO?: no   SOCIAL HISTORY: (Current as of 03/30/2019) Megan Khan is disabled secondary to her glaucoma.she is legally blind.  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 She is divorced. She lives with her son, Megan Khan who is 56 and is disabled with cerebral palsy.  He does attend school. Her son Megan Khan was killed (gunshot wound) age 31.Marland Kitchen Her son, Megan Khan, is 77 and works for this [employer]. Her daughter, [name], is 41 and is a Quar[employer] manager. Kennedie has 7 grandchildren and 2 great-grandchildren.  She attends the [employer]: Not in place. She has verbally named her sister, Megan Khan, as her healthcare power of attorney.  At the 03/30/2019 visit she was given the appropriate documents to complete and notarize at her discretion  HEALTH MAINTENANCE: Social History   Tobacco Use   Smoking status: Former Smoker    Types: Cigarettes    Quit date: 09/23/1995    Years since quitting: 23.8   Smokeless tobacco: Never Used  Substance Use Topics   Alcohol use: No   Drug use: No    Colonoscopy: June 2013/again in  PAP: Per Dr. Criss Rosales  Bone density: Never  Allergies  Allergen Reactions   Sulfamethoxazole Rash    REACTION: Questionable allergy ?    Current Outpatient Medications  Medication Sig Dispense Refill  amLODipine (NORVASC) 10 MG tablet TAKE 1 TABLET(10 MG) BY MOUTH DAILY 90 tablet 3   bimatoprost (LUMIGAN) 0.01 % SOLN Place 2 drops into the left eye at bedtime.      blood glucose meter kit and supplies KIT Dispense based on patient and insurance preference. Use up to four times daily as directed. (FOR ICD-9 250.00, 250.01). 1 each 12   dorzolamide (TRUSOPT) 2 % ophthalmic solution Place 2 drops into the left eye 3 (three) times daily.      gabapentin (NEURONTIN) 100 MG capsule Take 2 capsules (200 mg total) by mouth 3 (three) times daily. 180 capsule 0   lidocaine-prilocaine (EMLA) cream Apply to affected area once 30 g 3   lisinopril-hydrochlorothiazide (ZESTORETIC) 20-25 MG tablet Take 1 tablet by mouth daily. 90 tablet 3   lovastatin (MEVACOR) 20 MG tablet TAKE 1 TABLET BY MOUTH EVERY  NIGHT AT BEDTIME 90 tablet 3   metFORMIN (GLUCOPHAGE) 1000 MG tablet Take 1 tablet (1,000 mg total) by mouth 2 (two) times daily with a meal. 360 tablet 1   metoprolol succinate (TOPROL-XL) 50 MG 24 hr tablet TAKE 1 TABLET(50 MG) BY MOUTH DAILY 90 tablet 0   prochlorperazine (COMPAZINE) 10 MG tablet TAKE 1 TABLET(10 MG) BY MOUTH EVERY 6 HOURS AS NEEDED FOR NAUSEA OR VOMITING 30 tablet 1   No current facility-administered medications for this visit.     OBJECTIVE: Vitals:   07/23/19 1339  BP: 139/88  Pulse: 65  Resp: 17  Temp: 98.3 F (36.8 C)  SpO2: 100%     Body mass index is 29.39 kg/m.   Wt Readings from Last 3 Encounters:  07/23/19 158 lb 1.6 oz (71.7 kg)  07/22/19 160 lb (72.6 kg)  07/17/19 159 lb 1.6 oz (72.2 kg)  ECOG FS:1 - Symptomatic but completely ambulatory GENERAL: Patient is a well appearing female in no acute distress HEENT:  Sclerae anicteric.  Mask in place Neck is supple.  NODES:  No cervical, supraclavicular, or axillary lymphadenopathy palpated.  BREAST EXAM:  Deferred. LUNGS:  Clear to auscultation bilaterally.  No wheezes or rhonchi. HEART:  Regular rate and rhythm. No murmur appreciated. ABDOMEN:  Soft, nontender.  Positive, normoactive bowel sounds. No organomegaly palpated. MSK:  No focal spinal tenderness to palpation. Full range of motion bilaterally in the upper extremities. EXTREMITIES:  No peripheral edema.   SKIN:  Clear with no obvious rashes or skin changes. No nail dyscrasia. NEURO:  Nonfocal. Well oriented.  Appropriate affect.    LAB RESULTS:  CMP     Component Value Date/Time   NA 141 07/17/2019 1019   NA 142 05/27/2018 1533   K 3.7 07/17/2019 1019   CL 104 07/17/2019 1019   CO2 27 07/17/2019 1019   GLUCOSE 201 (H) 07/17/2019 1019   BUN 13 07/17/2019 1019   BUN 19 05/27/2018 1533   CREATININE 0.81 07/17/2019 1019   CREATININE 0.79 01/30/2016 1130   CALCIUM 9.0 07/17/2019 1019   PROT 6.7 07/17/2019 1019   ALBUMIN 4.0  07/17/2019 1019   AST 17 07/17/2019 1019   ALT 31 07/17/2019 1019   ALKPHOS 71 07/17/2019 1019   BILITOT 0.3 07/17/2019 1019   GFRNONAA >60 07/17/2019 1019   GFRNONAA 82 01/30/2016 1130   GFRAA >60 07/17/2019 1019   GFRAA >89 01/30/2016 1130    No results found for: TOTALPROTELP, ALBUMINELP, A1GS, A2GS, BETS, BETA2SER, GAMS, MSPIKE, SPEI  No results found for: KPAFRELGTCHN, LAMBDASER, KAPLAMBRATIO  Lab Results  Component Value Date  WBC 4.7 07/23/2019   NEUTROABS 1.9 07/23/2019   HGB 11.8 (L) 07/23/2019   HCT 37.0 07/23/2019   MCV 84.5 07/23/2019   PLT 332 07/23/2019    No results found for: LABCA2  No components found for: [id number]  No results for input(s): INR in the last 168 hours.  No results found for: LABCA2  No results found for: IEP329  No results found for: JJO841  No results found for: YSA630  No results found for: CA2729  No components found for: HGQUANT  No results found for: CEA1 / No results found for: CEA1   No results found for: AFPTUMOR  No results found for: CHROMOGRNA  No results found for: PSA1  Appointment on 07/23/2019  Component Date Value Ref Range Status   WBC Count 07/23/2019 4.7  4.0 - 10.5 K/uL Final   RBC 07/23/2019 4.38  3.87 - 5.11 MIL/uL Final   Hemoglobin 07/23/2019 11.8* 12.0 - 15.0 g/dL Final   HCT 07/23/2019 37.0  36.0 - 46.0 % Final   MCV 07/23/2019 84.5  80.0 - 100.0 fL Final   MCH 07/23/2019 26.9  26.0 - 34.0 pg Final   MCHC 07/23/2019 31.9  30.0 - 36.0 g/dL Final   RDW 07/23/2019 13.8  11.5 - 15.5 % Final   Platelet Count 07/23/2019 332  150 - 400 K/uL Final   nRBC 07/23/2019 0.0  0.0 - 0.2 % Final   Neutrophils Relative % 07/23/2019 40  % Final   Neutro Abs 07/23/2019 1.9  1.7 - 7.7 K/uL Final   Lymphocytes Relative 07/23/2019 47  % Final   Lymphs Abs 07/23/2019 2.2  0.7 - 4.0 K/uL Final   Monocytes Relative 07/23/2019 7  % Final   Monocytes Absolute 07/23/2019 0.3  0.1 - 1.0 K/uL Final    Eosinophils Relative 07/23/2019 4  % Final   Eosinophils Absolute 07/23/2019 0.2  0.0 - 0.5 K/uL Final   Basophils Relative 07/23/2019 2  % Final   Basophils Absolute 07/23/2019 0.1  0.0 - 0.1 K/uL Final   Immature Granulocytes 07/23/2019 0  % Final   Abs Immature Granulocytes 07/23/2019 0.02  0.00 - 0.07 K/uL Final   Performed at [facility], [place] [address] [place], [place] [address]    (this displays the last labs from the last 3 days)  No results found for: TOTALPROTELP, ALBUMINELP, A1GS, A2GS, BETS, BETA2SER, GAMS, MSPIKE, SPEI (this displays SPEP labs)  No results found for: KPAFRELGTCHN, LAMBDASER, KAPLAMBRATIO (kappa/lambda light chains)  No results found for: HGBA, HGBA2QUANT, HGBFQUANT, HGBSQUAN (Hemoglobinopathy evaluation)   No results found for: LDH  No results found for: IRON, TIBC, IRONPCTSAT (Iron and TIBC)  No results found for: FERRITIN  Urinalysis No results found for: COLORURINE, APPEARANCEUR, LABSPEC, PHURINE, GLUCOSEU, HGBUR, BILIRUBINUR, KETONESUR, PROTEINUR, UROBILINOGEN, NITRITE, LEUKOCYTESUR   STUDIES:  ECHOCARDIOGRAM COMPLETE  Result Date: 07/22/2019   ECHOCARDIOGRAM REPORT   Patient Name:   BANESA TRISTAN Date of Exam: 07/22/2019 Medical Rec #:  [id number]         Height:       61.5 in Accession #:    [id number]        Weight:       159.1 lb Date of Birth:  1955-12-31         BSA:          1.72 m Patient Age:    63 years          BP:  133/77 mmHg Patient Gender: F                 HR:           80 bpm. Exam Location:  Inpatient Procedure: 2D Echo Indications:    Chemo V67.2 / Z09  History:        Patient has prior history of Echocardiogram examinations, most                 recent 04/06/2019. Signs/Symptoms:Murmur; Risk                 Factors:Hypertension, Diabetes and Former Smoker. Breast                 Cancer,.  Sonographer:    Leavy Cella Referring Phys: [place]  1. Left  ventricular ejection fraction, by visual estimation, is 65 to 70%. The left ventricle has normal function. There is no left ventricular hypertrophy.  2. Left ventricular diastolic parameters are consistent with Grade I diastolic dysfunction (impaired relaxation).  3. The average left ventricular global longitudinal strain is -16.1 %.  4. The left ventricle has no regional wall motion abnormalities.  5. Global right ventricle has normal systolic function.The right ventricular size is normal. No increase in right ventricular wall thickness.  6. Left atrial size was normal.  7. Right atrial size was normal.  8. The pericardial effusion is circumferential.  9. Trivial pericardial effusion is present. 10. The mitral valve is myxomatous. Mild mitral valve regurgitation. 11. The tricuspid valve is grossly normal. Tricuspid valve regurgitation is trivial. 12. The aortic valve is tricuspid. Aortic valve regurgitation is not visualized. No evidence of aortic valve sclerosis or stenosis. 13. The pulmonic valve was grossly normal. Pulmonic valve regurgitation is not visualized. 14. Normal pulmonary artery systolic pressure. 15. The tricuspid regurgitant velocity is 2.80 m/s, and with an assumed right atrial pressure of 3 mmHg, the estimated right ventricular systolic pressure is normal at 34.4 mmHg. 16. The inferior vena cava is normal in size with greater than 50% respiratory variability, suggesting right atrial pressure of 3 mmHg. 17. A prior study was performed on 04/06/2019. 18. No change from prior study. FINDINGS  Left Ventricle: Left ventricular ejection fraction, by visual estimation, is 65 to 70%. The left ventricle has normal function. The average left ventricular global longitudinal strain is -16.1 %. The left ventricle has no regional wall motion abnormalities. The left ventricular internal cavity size was the left ventricle is normal in size. There is no left ventricular hypertrophy. Left ventricular diastolic  parameters are consistent with Grade I diastolic dysfunction (impaired relaxation). Normal left atrial pressure. Right Ventricle: The right ventricular size is normal. No increase in right ventricular wall thickness. Global RV systolic function is has normal systolic function. The tricuspid regurgitant velocity is 2.80 m/s, and with an assumed right atrial pressure  of 3 mmHg, the estimated right ventricular systolic pressure is normal at 34.4 mmHg. Left Atrium: Left atrial size was normal in size. Right Atrium: Right atrial size was normal in size Pericardium: Trivial pericardial effusion is present. The pericardial effusion is circumferential. Mitral Valve: The mitral valve is myxomatous. Mild mitral valve regurgitation. Tricuspid Valve: The tricuspid valve is grossly normal. Tricuspid valve regurgitation is trivial. Aortic Valve: The aortic valve is tricuspid. Aortic valve regurgitation is not visualized. The aortic valve is structurally normal, with no evidence of sclerosis or stenosis. Pulmonic Valve: The pulmonic valve was grossly normal. Pulmonic valve regurgitation is  not visualized. Pulmonic regurgitation is not visualized. Aorta: The aortic root is normal in size and structure. Venous: The inferior vena cava is normal in size with greater than 50% respiratory variability, suggesting right atrial pressure of 3 mmHg. IAS/Shunts: No atrial level shunt detected by color flow Doppler. Additional Comments: A prior study was performed on 04/06/2019.  LEFT VENTRICLE PLAX 2D LVIDd:         4.60 cm  Diastology LVIDs:         2.50 cm  LV e' lateral:   8.92 cm/s LV PW:         1.30 cm  LV E/e' lateral: 9.0 LV IVS:        1.00 cm  LV e' medial:    5.87 cm/s LVOT diam:     2.10 cm  LV E/e' medial:  13.7 LV SV:         75 ml LV SV Index:   41.80    2D Longitudinal Strain LVOT Area:     3.46 cm 2D Strain GLS Avg:     -16.1 %  RIGHT VENTRICLE RV S prime:     16.30 cm/s TAPSE (M-mode): 2.2 cm LEFT ATRIUM             Index        RIGHT ATRIUM           Index LA diam:        4.20 cm 2.44 cm/m  RA Area:     12.00 cm LA Vol (A2C):   60.5 ml 35.09 ml/m RA Volume:   24.30 ml  14.09 ml/m LA Vol (A4C):   43.6 ml 25.29 ml/m LA Biplane Vol: 52.9 ml 30.68 ml/m   AORTA Ao Root diam: 3.20 cm MITRAL VALVE                        TRICUSPID VALVE MV Area (PHT): 3.77 cm             TR Peak grad:   31.4 mmHg MV PHT:        58.29 msec           TR Vmax:        280.00 cm/s MV Decel Time: 201 msec MR Peak grad: 91.4 mmHg             SHUNTS MR Vmax:      478.00 cm/s           Systemic Diam: 2.10 cm MV E velocity: 80.60 cm/s 103 cm/s MV A velocity: 92.10 cm/s 70.3 cm/s MV E/A ratio:  0.88       1.5  Eleonore Chiquito MD Electronically signed by Eleonore Chiquito MD Signature Date/Time: 07/22/2019/4:57:01 PM    Final      ELIGIBLE FOR AVAILABLE RESEARCH PROTOCOL: UPBEAT  ASSESSMENT: 63 y.o. [place], [place] woman status post right breast upper outer quadrant biopsy 03/11/2019 for a clinical t1b N0, stage IA invasive ductal carcinoma, grade 2, estrogen and progesterone receptor negative, HER-2 amplified, with an MIB-1 of 10%.  (1) genetics testing 03/30/2019: declined by patient  (2) Right breast lumpectomy on 05/29/2019: pT1b pN0, stage IA invasive ductal carciinoma, grade 2,   (a) a total of 5 lymph nodes were negative for carcinoma  (b) superior margin positive, cleared with addition surgery on 06/10/2019   (c) surgery delayed due to abnormal EKG, patient cleared by Dr. Gwenlyn Found on 04/21/2019  (3) adjuvant chemotherapy to consist of Abraxane weekly  x12 with Trastuzumab, starting 07/10/2019  (a) Abraxane given due to diabetes, and inability to tolerate steroid premedication required with Paclitaxel.  (4) trastuzumab to be continued to complete 1 year  (a) baseline echo 04/06/2019 shows an ejection fraction greater than 65%  (5) adjuvant radiation to follow.  PLAN: Megan Khan continues on treatment with Abraxane and Trastuzumab.  She is tolerating  this moderately well.  I reviewed that I am concerned about her diarrhea, because it can be easy to get dehydrated from diarrhea, and then you must go to the ER, where many COVID patients are going.  We would like to avoid ER visit or hospitalization, in addition to the side effect itself.  I recommended Megan Khan use loperamide per two tablets with the first loose stool, then to follow the package direction.  She would like to use pepto bismol instead.  I also offered her Lucrezia Starch to take 1-2 times per day which is a powder that can help to thicken up the stool.  She would like to see how she can manage this with the pepto bismol first.  We talked about dehydration and potassium.  I reviewed with her that potassium is easily lost with diarrhea, and recommended she increase her potassium intake, particularly on days she is having increased stools.    Megan Khan will return next week for labs and treatment.  We will see her in 2 weeks for her treatment.  She was recommended to continue with the appropriate pandemic precautions. She knows to call for any questions that may arise between now and her next appointment.  We are happy to see her sooner if needed.  A total of (15) minutes of face-to-face time was spent with this patient with greater than 50% of that time in counseling and care-coordination.   Wilber Bihari, NP  07/23/19 1:42 PM Medical Oncology and Hematology [facility] [place], [place] [address] Tel. [phone]    Fax. [phone]

## 2019-07-24 ENCOUNTER — Other Ambulatory Visit: Payer: Self-pay | Admitting: Oncology

## 2019-07-27 ENCOUNTER — Other Ambulatory Visit: Payer: Self-pay | Admitting: Adult Health

## 2019-07-27 DIAGNOSIS — C50411 Malignant neoplasm of upper-outer quadrant of right female breast: Secondary | ICD-10-CM

## 2019-07-29 ENCOUNTER — Other Ambulatory Visit: Payer: Medicare Other

## 2019-07-29 ENCOUNTER — Ambulatory Visit: Payer: Medicare Other

## 2019-07-30 ENCOUNTER — Inpatient Hospital Stay: Payer: Medicare Other

## 2019-07-30 ENCOUNTER — Other Ambulatory Visit: Payer: Self-pay

## 2019-07-30 VITALS — BP 132/83 | HR 64 | Temp 98.0°F | Resp 20 | Wt 161.5 lb

## 2019-07-30 DIAGNOSIS — C50411 Malignant neoplasm of upper-outer quadrant of right female breast: Secondary | ICD-10-CM

## 2019-07-30 DIAGNOSIS — Z5112 Encounter for antineoplastic immunotherapy: Secondary | ICD-10-CM | POA: Diagnosis not present

## 2019-07-30 DIAGNOSIS — Z171 Estrogen receptor negative status [ER-]: Secondary | ICD-10-CM

## 2019-07-30 LAB — CMP (CANCER CENTER ONLY)
ALT: 30 U/L (ref 0–44)
AST: 19 U/L (ref 15–41)
Albumin: 4.1 g/dL (ref 3.5–5.0)
Alkaline Phosphatase: 72 U/L (ref 38–126)
Anion gap: 9 (ref 5–15)
BUN: 12 mg/dL (ref 8–23)
CO2: 27 mmol/L (ref 22–32)
Calcium: 9.1 mg/dL (ref 8.9–10.3)
Chloride: 105 mmol/L (ref 98–111)
Creatinine: 0.82 mg/dL (ref 0.44–1.00)
GFR, Est AFR Am: >60 mL/min (ref >60)
GFR, Estimated: >60 mL/min (ref >60)
Glucose, Bld: 117 mg/dL — ABNORMAL HIGH (ref 70–99)
Potassium: 3.5 mmol/L (ref 3.5–5.1)
Sodium: 141 mmol/L (ref 135–145)
Total Bilirubin: 0.3 mg/dL (ref 0.3–1.2)
Total Protein: 6.8 g/dL (ref 6.5–8.1)

## 2019-07-30 LAB — CBC WITH DIFFERENTIAL (CANCER CENTER ONLY)
Abs Immature Granulocytes: 0.05 10*3/uL (ref 0.00–0.07)
Basophils Absolute: 0.1 10*3/uL (ref 0.0–0.1)
Basophils Relative: 1 %
Eosinophils Absolute: 0.2 10*3/uL (ref 0.0–0.5)
Eosinophils Relative: 4 %
HCT: 36.1 % (ref 36.0–46.0)
Hemoglobin: 11.8 g/dL — ABNORMAL LOW (ref 12.0–15.0)
Immature Granulocytes: 1 %
Lymphocytes Relative: 50 %
Lymphs Abs: 2.8 10*3/uL (ref 0.7–4.0)
MCH: 27.3 pg (ref 26.0–34.0)
MCHC: 32.7 g/dL (ref 30.0–36.0)
MCV: 83.6 fL (ref 80.0–100.0)
Monocytes Absolute: 0.4 10*3/uL (ref 0.1–1.0)
Monocytes Relative: 8 %
Neutro Abs: 2 10*3/uL (ref 1.7–7.7)
Neutrophils Relative %: 36 %
Platelet Count: 393 10*3/uL (ref 150–400)
RBC: 4.32 MIL/uL (ref 3.87–5.11)
RDW: 14.2 % (ref 11.5–15.5)
WBC Count: 5.6 10*3/uL (ref 4.0–10.5)
nRBC: 0 % (ref 0.0–0.2)

## 2019-07-30 MED ORDER — PROCHLORPERAZINE EDISYLATE 10 MG/2ML IJ SOLN
INTRAMUSCULAR | Status: AC
  Filled 2019-07-30: qty 2

## 2019-07-30 MED ORDER — FAMOTIDINE IN NACL 20-0.9 MG/50ML-% IV SOLN
20.0000 mg | Freq: Once | INTRAVENOUS | Status: AC
  Administered 2019-07-30: 20 mg via INTRAVENOUS

## 2019-07-30 MED ORDER — HEPARIN SOD (PORK) LOCK FLUSH 100 UNIT/ML IV SOLN
500.0000 [IU] | Freq: Once | INTRAVENOUS | Status: AC | PRN
  Administered 2019-07-30: 500 [IU]
  Filled 2019-07-30: qty 5

## 2019-07-30 MED ORDER — PROCHLORPERAZINE EDISYLATE 10 MG/2ML IJ SOLN
10.0000 mg | Freq: Once | INTRAMUSCULAR | Status: AC
  Administered 2019-07-30: 10 mg via INTRAVENOUS

## 2019-07-30 MED ORDER — DIPHENHYDRAMINE HCL 50 MG/ML IJ SOLN
INTRAMUSCULAR | Status: AC
  Filled 2019-07-30: qty 1

## 2019-07-30 MED ORDER — TRASTUZUMAB-DKST CHEMO 150 MG IV SOLR
150.0000 mg | Freq: Once | INTRAVENOUS | Status: AC
  Administered 2019-07-30: 150 mg via INTRAVENOUS
  Filled 2019-07-30: qty 7.14

## 2019-07-30 MED ORDER — ACETAMINOPHEN 325 MG PO TABS
650.0000 mg | ORAL_TABLET | Freq: Once | ORAL | Status: AC
  Administered 2019-07-30: 650 mg via ORAL

## 2019-07-30 MED ORDER — SODIUM CHLORIDE 0.9% FLUSH
10.0000 mL | INTRAVENOUS | Status: DC | PRN
  Administered 2019-07-30: 10 mL
  Filled 2019-07-30: qty 10

## 2019-07-30 MED ORDER — FAMOTIDINE IN NACL 20-0.9 MG/50ML-% IV SOLN
INTRAVENOUS | Status: AC
  Filled 2019-07-30: qty 50

## 2019-07-30 MED ORDER — ACETAMINOPHEN 325 MG PO TABS
ORAL_TABLET | ORAL | Status: AC
  Filled 2019-07-30: qty 2

## 2019-07-30 MED ORDER — DIPHENHYDRAMINE HCL 50 MG/ML IJ SOLN
25.0000 mg | Freq: Once | INTRAMUSCULAR | Status: AC
  Administered 2019-07-30: 25 mg via INTRAVENOUS

## 2019-07-30 MED ORDER — PACLITAXEL PROTEIN-BOUND CHEMO INJECTION 100 MG
100.0000 mg/m2 | Freq: Once | INTRAVENOUS | Status: AC
  Administered 2019-07-30: 11:00:00 175 mg via INTRAVENOUS
  Filled 2019-07-30: qty 35

## 2019-07-30 MED ORDER — SODIUM CHLORIDE 0.9 % IV SOLN
Freq: Once | INTRAVENOUS | Status: AC
  Filled 2019-07-30: qty 250

## 2019-07-30 NOTE — Patient Instructions (Signed)

## 2019-07-30 NOTE — Patient Instructions (Signed)
[facility] Discharge Instructions for Patients Receiving Chemotherapy  Today you received the following chemotherapy agents: Trastuzumab, Paclitaxel-protein bound  To help prevent nausea and vomiting after your treatment, we encourage you to take your nausea medication as directed by your MD.   If you develop nausea and vomiting that is not controlled by your nausea medication, call the clinic.   BELOW ARE SYMPTOMS THAT SHOULD BE REPORTED IMMEDIATELY:  *FEVER GREATER THAN 100.5 F  *CHILLS WITH OR WITHOUT FEVER  NAUSEA AND VOMITING THAT IS NOT CONTROLLED WITH YOUR NAUSEA MEDICATION  *UNUSUAL SHORTNESS OF BREATH  *UNUSUAL BRUISING OR BLEEDING  TENDERNESS IN MOUTH AND THROAT WITH OR WITHOUT PRESENCE OF ULCERS  *URINARY PROBLEMS  *BOWEL PROBLEMS  UNUSUAL RASH Items with * indicate a potential emergency and should be followed up as soon as possible.  Feel free to call the clinic should you have any questions or concerns. The clinic phone number is [phone].  Please show the [facility] at check-in to the Emergency Department and triage nurse. Coronavirus (COVID-19) Are you at risk?  Are you at risk for the Coronavirus (COVID-19)?  To be considered HIGH RISK for Coronavirus (COVID-19), you have to meet the following criteria:  . Traveled to [place], [place], [place], [place] or [place]; or in the [place] to [place], [place], [place], or [place]; and have fever, cough, and shortness of breath within the last 2 weeks of travel OR . Been in close contact with a person diagnosed with COVID-19 within the last 2 weeks and have fever, cough, and shortness of breath . IF YOU DO NOT MEET THESE CRITERIA, YOU ARE CONSIDERED LOW RISK FOR COVID-19.  What to do if you are HIGH RISK for COVID-19?  Marland Kitchen If you are having a medical emergency, call 911. . Seek medical care right away. Before you go to a doctor's office, urgent care or emergency department,  call ahead and tell them about your recent travel, contact with someone diagnosed with COVID-19, and your symptoms. You should receive instructions from your physician's office regarding next steps of care.  . When you arrive at healthcare provider, tell the healthcare staff immediately you have returned from visiting [place], [place], [place], [place] or [place]; or traveled in the [place] to [place], [place], [place], or [place]; in the last two weeks or you have been in close contact with a person diagnosed with COVID-19 in the last 2 weeks.   . Tell the health care staff about your symptoms: fever, cough and shortness of breath. . After you have been seen by a medical provider, you will be either: o Tested for (COVID-19) and discharged home on quarantine except to seek medical care if symptoms worsen, and asked to  - Stay home and avoid contact with others until you get your results (4-5 days)  - Avoid travel on public transportation if possible (such as bus, train, or airplane) or o Sent to the Emergency Department by EMS for evaluation, COVID-19 testing, and possible admission depending on your condition and test results.  What to do if you are LOW RISK for COVID-19?  Reduce your risk of any infection by using the same precautions used for avoiding the common cold or flu:  Marland Kitchen Wash your hands often with soap and warm water for at least 20 seconds.  If soap and water are not readily available, use an alcohol-based hand sanitizer with at least 60% alcohol.  Marland Kitchen  If coughing or sneezing, cover your mouth and nose by coughing or sneezing into the elbow areas of your shirt or coat, into a tissue or into your sleeve (not your hands). . Avoid shaking hands with others and consider head nods or verbal greetings only. . Avoid touching your eyes, nose, or mouth with unwashed hands.  . Avoid close contact with people who are sick. . Avoid places or events with large numbers of people in one  location, like concerts or sporting events. . Carefully consider travel plans you have or are making. . If you are planning any travel outside or inside the [place], visit the CDC's Travelers' Health webpage for the latest health notices. . If you have some symptoms but not all symptoms, continue to monitor at home and seek medical attention if your symptoms worsen. . If you are having a medical emergency, call 911.   [place] / e-Visit: [email or web address]         [facility]: [facility]: [phone]                   [facility]: [phone]

## 2019-08-03 ENCOUNTER — Encounter: Payer: Self-pay | Admitting: *Deleted

## 2019-08-05 ENCOUNTER — Ambulatory Visit: Payer: Medicare Other

## 2019-08-05 ENCOUNTER — Ambulatory Visit: Payer: Medicare Other | Admitting: Adult Health

## 2019-08-05 ENCOUNTER — Other Ambulatory Visit: Payer: Medicare Other

## 2019-08-06 ENCOUNTER — Inpatient Hospital Stay (HOSPITAL_BASED_OUTPATIENT_CLINIC_OR_DEPARTMENT_OTHER): Payer: Medicare Other | Admitting: Adult Health

## 2019-08-06 ENCOUNTER — Encounter: Payer: Self-pay | Admitting: Adult Health

## 2019-08-06 ENCOUNTER — Ambulatory Visit: Payer: Medicare Other

## 2019-08-06 ENCOUNTER — Inpatient Hospital Stay: Payer: Medicare Other

## 2019-08-06 ENCOUNTER — Other Ambulatory Visit: Payer: Self-pay | Admitting: Hematology and Oncology

## 2019-08-06 ENCOUNTER — Other Ambulatory Visit: Payer: Self-pay

## 2019-08-06 VITALS — BP 137/88 | HR 65 | Temp 98.2°F | Resp 16 | Ht 61.5 in | Wt 160.6 lb

## 2019-08-06 DIAGNOSIS — C50411 Malignant neoplasm of upper-outer quadrant of right female breast: Secondary | ICD-10-CM

## 2019-08-06 DIAGNOSIS — Z171 Estrogen receptor negative status [ER-]: Secondary | ICD-10-CM

## 2019-08-06 DIAGNOSIS — Z5112 Encounter for antineoplastic immunotherapy: Secondary | ICD-10-CM | POA: Diagnosis not present

## 2019-08-06 DIAGNOSIS — Z95828 Presence of other vascular implants and grafts: Secondary | ICD-10-CM

## 2019-08-06 LAB — CMP (CANCER CENTER ONLY)
ALT: 37 U/L (ref 0–44)
AST: 20 U/L (ref 15–41)
Albumin: 4.1 g/dL (ref 3.5–5.0)
Alkaline Phosphatase: 73 U/L (ref 38–126)
Anion gap: 10 (ref 5–15)
BUN: 10 mg/dL (ref 8–23)
CO2: 26 mmol/L (ref 22–32)
Calcium: 9.2 mg/dL (ref 8.9–10.3)
Chloride: 105 mmol/L (ref 98–111)
Creatinine: 0.81 mg/dL (ref 0.44–1.00)
GFR, Est AFR Am: >60 mL/min (ref >60)
GFR, Estimated: >60 mL/min (ref >60)
Glucose, Bld: 143 mg/dL — ABNORMAL HIGH (ref 70–99)
Potassium: 3.8 mmol/L (ref 3.5–5.1)
Sodium: 141 mmol/L (ref 135–145)
Total Bilirubin: 0.2 mg/dL — ABNORMAL LOW (ref 0.3–1.2)
Total Protein: 6.9 g/dL (ref 6.5–8.1)

## 2019-08-06 LAB — CBC WITH DIFFERENTIAL (CANCER CENTER ONLY)
Abs Immature Granulocytes: 0.04 10*3/uL (ref 0.00–0.07)
Basophils Absolute: 0.1 10*3/uL (ref 0.0–0.1)
Basophils Relative: 2 %
Eosinophils Absolute: 0.4 10*3/uL (ref 0.0–0.5)
Eosinophils Relative: 6 %
HCT: 35.9 % — ABNORMAL LOW (ref 36.0–46.0)
Hemoglobin: 11.7 g/dL — ABNORMAL LOW (ref 12.0–15.0)
Immature Granulocytes: 1 %
Lymphocytes Relative: 44 %
Lymphs Abs: 2.8 10*3/uL (ref 0.7–4.0)
MCH: 27.2 pg (ref 26.0–34.0)
MCHC: 32.6 g/dL (ref 30.0–36.0)
MCV: 83.5 fL (ref 80.0–100.0)
Monocytes Absolute: 0.5 10*3/uL (ref 0.1–1.0)
Monocytes Relative: 8 %
Neutro Abs: 2.4 10*3/uL (ref 1.7–7.7)
Neutrophils Relative %: 39 %
Platelet Count: 348 10*3/uL (ref 150–400)
RBC: 4.3 MIL/uL (ref 3.87–5.11)
RDW: 15 % (ref 11.5–15.5)
WBC Count: 6.2 10*3/uL (ref 4.0–10.5)
nRBC: 0 % (ref 0.0–0.2)

## 2019-08-06 MED ORDER — SODIUM CHLORIDE 0.9% FLUSH
10.0000 mL | INTRAVENOUS | Status: DC | PRN
  Administered 2019-08-06: 10 mL
  Filled 2019-08-06: qty 10

## 2019-08-06 MED ORDER — PROCHLORPERAZINE EDISYLATE 10 MG/2ML IJ SOLN
INTRAMUSCULAR | Status: AC
  Filled 2019-08-06: qty 2

## 2019-08-06 MED ORDER — DIPHENHYDRAMINE HCL 50 MG/ML IJ SOLN
25.0000 mg | Freq: Once | INTRAMUSCULAR | Status: AC
  Administered 2019-08-06: 25 mg via INTRAVENOUS

## 2019-08-06 MED ORDER — FAMOTIDINE IN NACL 20-0.9 MG/50ML-% IV SOLN
20.0000 mg | Freq: Once | INTRAVENOUS | Status: AC
  Administered 2019-08-06: 20 mg via INTRAVENOUS

## 2019-08-06 MED ORDER — ACETAMINOPHEN 325 MG PO TABS
ORAL_TABLET | ORAL | Status: AC
  Filled 2019-08-06: qty 2

## 2019-08-06 MED ORDER — PACLITAXEL PROTEIN-BOUND CHEMO INJECTION 100 MG
100.0000 mg/m2 | Freq: Once | INTRAVENOUS | Status: AC
  Administered 2019-08-06: 175 mg via INTRAVENOUS
  Filled 2019-08-06: qty 35

## 2019-08-06 MED ORDER — TRASTUZUMAB-DKST CHEMO 150 MG IV SOLR
150.0000 mg | Freq: Once | INTRAVENOUS | Status: AC
  Administered 2019-08-06: 150 mg via INTRAVENOUS
  Filled 2019-08-06: qty 7.14

## 2019-08-06 MED ORDER — SODIUM CHLORIDE 0.9 % IV SOLN
Freq: Once | INTRAVENOUS | Status: AC
  Filled 2019-08-06: qty 250

## 2019-08-06 MED ORDER — SODIUM CHLORIDE 0.9% FLUSH
10.0000 mL | INTRAVENOUS | Status: DC | PRN
  Administered 2019-08-06: 09:00:00 10 mL via INTRAVENOUS
  Filled 2019-08-06: qty 10

## 2019-08-06 MED ORDER — FAMOTIDINE IN NACL 20-0.9 MG/50ML-% IV SOLN
INTRAVENOUS | Status: AC
  Filled 2019-08-06: qty 50

## 2019-08-06 MED ORDER — ACETAMINOPHEN 325 MG PO TABS
650.0000 mg | ORAL_TABLET | Freq: Once | ORAL | Status: AC
  Administered 2019-08-06: 650 mg via ORAL

## 2019-08-06 MED ORDER — HEPARIN SOD (PORK) LOCK FLUSH 100 UNIT/ML IV SOLN
500.0000 [IU] | Freq: Once | INTRAVENOUS | Status: AC | PRN
  Administered 2019-08-06: 500 [IU]
  Filled 2019-08-06: qty 5

## 2019-08-06 MED ORDER — DIPHENHYDRAMINE HCL 50 MG/ML IJ SOLN
INTRAMUSCULAR | Status: AC
  Filled 2019-08-06: qty 1

## 2019-08-06 MED ORDER — PROCHLORPERAZINE EDISYLATE 10 MG/2ML IJ SOLN
10.0000 mg | Freq: Once | INTRAMUSCULAR | Status: AC
  Administered 2019-08-06: 10 mg via INTRAVENOUS

## 2019-08-06 NOTE — Progress Notes (Signed)
[facility]  [phone] [phone]    ID: Megan Khan DOB: 02/04/1956  MR#: [id number]  CSN#:[id number]  Patient Care Team: Bland, Scott, DO as PCP - General [name], Daniel R, MD as PCP - Advanced Heart Failure (Cardiology) [name], Gustav C, MD as Consulting Physician (Oncology) Ingram, Haywood, MD as Consulting Physician (General Surgery) Moody, John, MD as Consulting Physician (Radiation Oncology) [name], Norman S, DPM as Consulting Physician (Podiatry) Whitaker, Roy, MD as Consulting Physician (Ophthalmology) Ganem, Salem F, MD as Consulting Physician (Gastroenterology) Stuart, Dawn C, RN as Oncology Nurse Navigator Martini, Keisha N, RN as Oncology Nurse Navigator OTHER MD:   CHIEF COMPLAINT: Estrogen receptor negative breast cancer  CURRENT TREATMENT: Abraxane/Trastuzumab   INTERVAL HISTORY: Megan Khan is here for follow up of her HER-2 positive breast cancer accompanied by her son.  She began adjuvant chemotherapy with Abraxane and Trastuzumab (ogivri which is biosimilar) on 07/10/2019. She will receive this weekly x 12 weeks, and then will go onto receive Trastuzumab every 3 weeks to complete1 year of therapy.    Today is day 1 cycle 5 of her chemoimmunotherapy.  She notes that she is tolerating her treatment moderately well.  She has no peripheral neuropathy.  Her most recent echocardiogram was on 07/22/2019 and showed an EF of 65-70%.   REVIEW OF SYSTEMS: Megan Khan is doing well today.  She notes that she has been taking two imodium if she has diarrhea, and that will stop the diarrhea for several days.  She denies peripheral neuropathy in her fingertips and toes.  She has an occasional headache and this is relieved with tylenol.  She is drinking water, and says she drinks this all day.    Megan Khan denies any fever, chills, chest pain, palpitations, cough, shortness of breath, bladder changes, decreased appetite, fatigue, nausea,  vomiting, vision issues, skin changes, nail dyscrasia, mucositis or any other concerns.  A detailed ROS was otherwise non contributory.    HISTORY OF CURRENT ILLNESS: From the original intake note:  Megan Khan had routine screening mammography on 02/24/2019 showing a possible abnormality in the right breast. She underwent unilateral right diagnostic mammography with tomography and right breast ultrasonography at [facility] on 03/04/2019 showing: Breast Density Category B. Additional imaging of the right breast was performed. There is a persistent asymmetry and distortion in the middle third of the upper-outer quadrant of the breast. There are no malignant type microcalcifications. On physical exam, I do not palpate a mass in the upper-outer quadrant of the right breast. Targeted ultrasound is performed, showing an irregular hypoechoic mass in the right breast 11 o'clock 7 cm from the nipple measuring 0.7 x 0.7 x 1.0 cm. Sonographic evaluation of the right axilla does not show any enlarged adenopathy.  Accordingly on 03/11/2019 she proceeded to biopsy of the right breast area in question. The pathology from this procedure showed ([id number]): invasive ductal carcinoma, grade II, 11 o'clock 7 cm from the nipple. Prognostic indicators significant for: estrogen receptor, 0% negative and progesterone receptor, 0% negative. Proliferation marker Ki67 at 10%. HER2 positive (3+) by immunohistochemistry.  The patient's subsequent history is as detailed below.    PAST MEDICAL HISTORY: Past Medical History:  Diagnosis Date  . Anemia    during pregnancy  . Cancer (HCC) 2020   breast cancer on right  . Diabetes mellitus    Type 2  . Family history of breast cancer   . Family history of throat cancer   . Glaucoma   .   Glaucoma   . Hx of colonic polyps   . Hyperlipidemia   . Hypertension   . Legally blind    completely blind in left eye, low vision in right    PAST SURGICAL  HISTORY: Past Surgical History:  Procedure Laterality Date  . BREAST LUMPECTOMY WITH RADIOACTIVE SEED AND SENTINEL LYMPH NODE BIOPSY Right 05/29/2019   Procedure: RIGHT BREAST LUMPECTOMY WITH RADIOACTIVE SEED AND RIGHT DEEP AXILLARY SENTINEL LYMPH NODE BIOPSY INJECT BLUE DYE;  Surgeon: Fanny Skates, MD;  Location: [facility];  Service: General;  Laterality: Right;  . COLONOSCOPY  01/22/2012   Procedure: COLONOSCOPY;  Surgeon: [name], MD;  Location: [facility];  Service: Endoscopy;  Laterality: N/A;  . COLONOSCOPY    . COLONOSCOPY    . PORTACATH PLACEMENT N/A 05/29/2019   Procedure: INSERTION PORT-A-CATH WITH ULTRASOUND;  Surgeon: Fanny Skates, MD;  Location: [place];  Service: General;  Laterality: N/A;  . RE-EXCISION OF BREAST LUMPECTOMY Right 06/10/2019   Procedure: RE-EXCISION OF RIGHT BREAST LUMPECTOMY MARGINS;  Surgeon: Fanny Skates, MD;  Location: [place];  Service: General;  Laterality: Right;  . TUBAL LIGATION       FAMILY HISTORY: Family History  Problem Relation Age of Onset  . Breast cancer Sister 3       Her2 +  . Breast cancer Cousin        maternal second cousin, Bilateral mastectomy, diagnosed in her 49s   Megan Khan father died from natural causes at age 48. Patients' mother died from natural causes at age 67. The patient has 5 brothers and 3 sisters.  The patient's sister, Megan Khan, is my patient diagnosed with breast cancer at 61.  The cyst cancer was estrogen receptor positive, HER-2 negative.  She received no chemo.  Akshara's maternal 2nd cousin was diagnosed with breast cancer. Patient denies anyone in her family having ovarian, prostate, or pancreatic cancer.    GYNECOLOGIC HISTORY:  No LMP recorded. Patient is postmenopausal. Menarche: 63 years old Age at first live birth: 63 years old GX P: 3 LMP: 109 Contraceptive: no HRT: no  Hysterectomy?: no BSO?: no   SOCIAL HISTORY: (Current as of 03/30/2019) Megan Khan is disabled secondary to her glaucoma.she is  legally blind.  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 She is divorced. She lives with her son, Megan Khan who is 56 and is disabled with cerebral palsy.  He does attend school. Her son Megan Khan was killed (gunshot wound) age 18.Marland Kitchen Her son, Megan Khan, is 50 and works for this [employer]. Her daughter, Megan Khan, is 38 and is a Quarry manager. Kelaiah has 7 grandchildren and 2 great-grandchildren.  She attends the [name]: Not in place. She has verbally named her sister, Megan Khan, as her healthcare power of attorney.  At the 03/30/2019 visit she was given the appropriate documents to complete and notarize at her discretion  HEALTH MAINTENANCE: Social History   Tobacco Use  . Smoking status: Former Smoker    Types: Cigarettes    Quit date: 09/23/1995    Years since quitting: 23.8  . Smokeless tobacco: Never Used  Substance Use Topics  . Alcohol use: No  . Drug use: No    Colonoscopy: June 2013/again in  PAP: Per Dr. Criss Rosales  Bone density: Never  Allergies  Allergen Reactions  . Sulfamethoxazole Rash    REACTION: Questionable allergy ?    Current Outpatient Medications  Medication  Sig Dispense Refill  . amLODipine (NORVASC) 10 MG tablet TAKE 1 TABLET(10 MG) BY MOUTH DAILY 90 tablet 3  . bimatoprost (LUMIGAN) 0.01 % SOLN Place 2 drops into the left eye at bedtime.     . blood glucose meter kit and supplies KIT Dispense based on patient and insurance preference. Use up to four times daily as directed. (FOR ICD-9 250.00, 250.01). 1 each 12  . dorzolamide (TRUSOPT) 2 % ophthalmic solution Place 2 drops into the left eye 3 (three) times daily.     . gabapentin (NEURONTIN) 100 MG capsule Take 2 capsules (200 mg total) by mouth 3 (three) times daily. 180 capsule 0  . lidocaine-prilocaine (EMLA) cream Apply to affected area once 30 g 3  . lisinopril-hydrochlorothiazide (ZESTORETIC) 20-25 MG tablet Take 1 tablet by mouth daily. 90  tablet 3  . lovastatin (MEVACOR) 20 MG tablet TAKE 1 TABLET BY MOUTH EVERY NIGHT AT BEDTIME 90 tablet 3  . metFORMIN (GLUCOPHAGE) 1000 MG tablet Take 1 tablet (1,000 mg total) by mouth 2 (two) times daily with a meal. 360 tablet 1  . metoprolol succinate (TOPROL-XL) 50 MG 24 hr tablet TAKE 1 TABLET(50 MG) BY MOUTH DAILY 90 tablet 0  . prochlorperazine (COMPAZINE) 10 MG tablet TAKE 1 TABLET(10 MG) BY MOUTH EVERY 6 HOURS AS NEEDED FOR NAUSEA OR VOMITING 30 tablet 1   No current facility-administered medications for this visit.     OBJECTIVE: Vitals:   08/06/19 0939  BP: 137/88  Pulse: 65  Resp: 16  Temp: 98.2 F (36.8 C)  SpO2: 99%     Body mass index is 29.85 kg/m.   Wt Readings from Last 3 Encounters:  08/06/19 160 lb 9.6 oz (72.8 kg)  07/30/19 161 lb 8 oz (73.3 kg)  07/23/19 158 lb 1.6 oz (71.7 kg)  ECOG FS:1 - Symptomatic but completely ambulatory GENERAL: Patient is a well appearing female in no acute distress HEENT:  Sclerae anicteric.  Mask in place Neck is supple.  NODES:  No cervical, supraclavicular, or axillary lymphadenopathy palpated.  BREAST EXAM:  Deferred. LUNGS:  Clear to auscultation bilaterally.  No wheezes or rhonchi. HEART:  Regular rate and rhythm. No murmur appreciated. ABDOMEN:  Soft, nontender.  Positive, normoactive bowel sounds. No organomegaly palpated. MSK:  No focal spinal tenderness to palpation. Full range of motion bilaterally in the upper extremities. EXTREMITIES:  No peripheral edema.   SKIN:  Clear with no obvious rashes or skin changes. No nail dyscrasia. NEURO:  Nonfocal. Well oriented.  Appropriate affect.    LAB RESULTS:  CMP     Component Value Date/Time   NA 141 07/30/2019 0815   NA 142 05/27/2018 1533   K 3.5 07/30/2019 0815   CL 105 07/30/2019 0815   CO2 27 07/30/2019 0815   GLUCOSE 117 (H) 07/30/2019 0815   BUN 12 07/30/2019 0815   BUN 19 05/27/2018 1533   CREATININE 0.82 07/30/2019 0815   CREATININE 0.79 01/30/2016 1130    CALCIUM 9.1 07/30/2019 0815   PROT 6.8 07/30/2019 0815   ALBUMIN 4.1 07/30/2019 0815   AST 19 07/30/2019 0815   ALT 30 07/30/2019 0815   ALKPHOS 72 07/30/2019 0815   BILITOT 0.3 07/30/2019 0815   GFRNONAA >60 07/30/2019 0815   GFRNONAA 82 01/30/2016 1130   GFRAA >60 07/30/2019 0815   GFRAA >89 01/30/2016 1130    No results found for: TOTALPROTELP, ALBUMINELP, A1GS, A2GS, BETS, BETA2SER, GAMS, MSPIKE, SPEI  No results found for: KPAFRELGTCHN, LAMBDASER, KAPLAMBRATIO    Lab Results  Component Value Date   WBC 6.2 08/06/2019   NEUTROABS 2.4 08/06/2019   HGB 11.7 (L) 08/06/2019   HCT 35.9 (L) 08/06/2019   MCV 83.5 08/06/2019   PLT 348 08/06/2019    No results found for: LABCA2  No components found for: LABCAN125  No results for input(s): INR in the last 168 hours.  No results found for: LABCA2  No results found for: CAN199  No results found for: CAN125  No results found for: CAN153  No results found for: CA2729  No components found for: HGQUANT  No results found for: CEA1 / No results found for: CEA1   No results found for: AFPTUMOR  No results found for: CHROMOGRNA  No results found for: PSA1  Appointment on 08/06/2019  Component Date Value Ref Range Status  . WBC Count 08/06/2019 6.2  4.0 - 10.5 K/uL Final  . RBC 08/06/2019 4.30  3.87 - 5.11 MIL/uL Final  . Hemoglobin 08/06/2019 11.7* 12.0 - 15.0 g/dL Final  . HCT 08/06/2019 35.9* 36.0 - 46.0 % Final  . MCV 08/06/2019 83.5  80.0 - 100.0 fL Final  . MCH 08/06/2019 27.2  26.0 - 34.0 pg Final  . MCHC 08/06/2019 32.6  30.0 - 36.0 g/dL Final  . RDW 08/06/2019 15.0  11.5 - 15.5 % Final  . Platelet Count 08/06/2019 348  150 - 400 K/uL Final  . nRBC 08/06/2019 0.0  0.0 - 0.2 % Final  . Neutrophils Relative % 08/06/2019 39  % Final  . Neutro Abs 08/06/2019 2.4  1.7 - 7.7 K/uL Final  . Lymphocytes Relative 08/06/2019 44  % Final  . Lymphs Abs 08/06/2019 2.8  0.7 - 4.0 K/uL Final  . Monocytes Relative  08/06/2019 8  % Final  . Monocytes Absolute 08/06/2019 0.5  0.1 - 1.0 K/uL Final  . Eosinophils Relative 08/06/2019 6  % Final  . Eosinophils Absolute 08/06/2019 0.4  0.0 - 0.5 K/uL Final  . Basophils Relative 08/06/2019 2  % Final  . Basophils Absolute 08/06/2019 0.1  0.0 - 0.1 K/uL Final  . Immature Granulocytes 08/06/2019 1  % Final  . Abs Immature Granulocytes 08/06/2019 0.04  0.00 - 0.07 K/uL Final   Performed at [facility], [address] [place], [place] [address]    (this displays the last labs from the last 3 days)  No results found for: TOTALPROTELP, ALBUMINELP, A1GS, A2GS, BETS, BETA2SER, GAMS, MSPIKE, SPEI (this displays SPEP labs)  No results found for: KPAFRELGTCHN, LAMBDASER, KAPLAMBRATIO (kappa/lambda light chains)  No results found for: HGBA, HGBA2QUANT, HGBFQUANT, HGBSQUAN (Hemoglobinopathy evaluation)   No results found for: LDH  No results found for: IRON, TIBC, IRONPCTSAT (Iron and TIBC)  No results found for: FERRITIN  Urinalysis No results found for: COLORURINE, APPEARANCEUR, LABSPEC, PHURINE, GLUCOSEU, HGBUR, BILIRUBINUR, KETONESUR, PROTEINUR, UROBILINOGEN, NITRITE, LEUKOCYTESUR   STUDIES:  ECHOCARDIOGRAM COMPLETE  Result Date: 07/22/2019   ECHOCARDIOGRAM REPORT   Patient Name:   Rekita D Sica Date of Exam: 07/22/2019 Medical Rec #:  [id number]         Height:       61.5 in Accession #:    [id number]        Weight:       159.1 lb Date of Birth:  03/06/1956         BSA:          1.72 m Patient Age:    63 years            BP:           133/77 mmHg Patient Gender: F                 HR:           80 bpm. Exam Location:  Inpatient Procedure: 2D Echo Indications:    Chemo V67.2 / Z09  History:        Patient has prior history of Echocardiogram examinations, most                 recent 04/06/2019. Signs/Symptoms:Murmur; Risk                 Factors:Hypertension, Diabetes and Former Smoker. Breast                 Cancer,.  Sonographer:     Johanna Elliott Referring Phys: [id number] GUSTAV C [name] IMPRESSIONS  1. Left ventricular ejection fraction, by visual estimation, is 65 to 70%. The left ventricle has normal function. There is no left ventricular hypertrophy.  2. Left ventricular diastolic parameters are consistent with Grade I diastolic dysfunction (impaired relaxation).  3. The average left ventricular global longitudinal strain is -16.1 %.  4. The left ventricle has no regional wall motion abnormalities.  5. Global right ventricle has normal systolic function.The right ventricular size is normal. No increase in right ventricular wall thickness.  6. Left atrial size was normal.  7. Right atrial size was normal.  8. The pericardial effusion is circumferential.  9. Trivial pericardial effusion is present. 10. The mitral valve is myxomatous. Mild mitral valve regurgitation. 11. The tricuspid valve is grossly normal. Tricuspid valve regurgitation is trivial. 12. The aortic valve is tricuspid. Aortic valve regurgitation is not visualized. No evidence of aortic valve sclerosis or stenosis. 13. The pulmonic valve was grossly normal. Pulmonic valve regurgitation is not visualized. 14. Normal pulmonary artery systolic pressure. 15. The tricuspid regurgitant velocity is 2.80 m/s, and with an assumed right atrial pressure of 3 mmHg, the estimated right ventricular systolic pressure is normal at 34.4 mmHg. 16. The inferior vena cava is normal in size with greater than 50% respiratory variability, suggesting right atrial pressure of 3 mmHg. 17. A prior study was performed on 04/06/2019. 18. No change from prior study. FINDINGS  Left Ventricle: Left ventricular ejection fraction, by visual estimation, is 65 to 70%. The left ventricle has normal function. The average left ventricular global longitudinal strain is -16.1 %. The left ventricle has no regional wall motion abnormalities. The left ventricular internal cavity size was the left ventricle is normal in  size. There is no left ventricular hypertrophy. Left ventricular diastolic parameters are consistent with Grade I diastolic dysfunction (impaired relaxation). Normal left atrial pressure. Right Ventricle: The right ventricular size is normal. No increase in right ventricular wall thickness. Global RV systolic function is has normal systolic function. The tricuspid regurgitant velocity is 2.80 m/s, and with an assumed right atrial pressure  of 3 mmHg, the estimated right ventricular systolic pressure is normal at 34.4 mmHg. Left Atrium: Left atrial size was normal in size. Right Atrium: Right atrial size was normal in size Pericardium: Trivial pericardial effusion is present. The pericardial effusion is circumferential. Mitral Valve: The mitral valve is myxomatous. Mild mitral valve regurgitation. Tricuspid Valve: The tricuspid valve is grossly normal. Tricuspid valve regurgitation is trivial. Aortic Valve: The aortic valve is tricuspid. Aortic valve regurgitation is not visualized. The aortic valve is structurally normal, with no evidence of sclerosis or stenosis. Pulmonic   Valve: The pulmonic valve was grossly normal. Pulmonic valve regurgitation is not visualized. Pulmonic regurgitation is not visualized. Aorta: The aortic root is normal in size and structure. Venous: The inferior vena cava is normal in size with greater than 50% respiratory variability, suggesting right atrial pressure of 3 mmHg. IAS/Shunts: No atrial level shunt detected by color flow Doppler. Additional Comments: A prior study was performed on 04/06/2019.  LEFT VENTRICLE PLAX 2D LVIDd:         4.60 cm  Diastology LVIDs:         2.50 cm  LV e' lateral:   8.92 cm/s LV PW:         1.30 cm  LV E/e' lateral: 9.0 LV IVS:        1.00 cm  LV e' medial:    5.87 cm/s LVOT diam:     2.10 cm  LV E/e' medial:  13.7 LV SV:         75 ml LV SV Index:   41.80    2D Longitudinal Strain LVOT Area:     3.46 cm 2D Strain GLS Avg:     -16.1 %  RIGHT VENTRICLE RV S  prime:     16.30 cm/s TAPSE (M-mode): 2.2 cm LEFT ATRIUM             Index       RIGHT ATRIUM           Index LA diam:        4.20 cm 2.44 cm/m  RA Area:     12.00 cm LA Vol (A2C):   60.5 ml 35.09 ml/m RA Volume:   24.30 ml  14.09 ml/m LA Vol (A4C):   43.6 ml 25.29 ml/m LA Biplane Vol: 52.9 ml 30.68 ml/m   AORTA Khan Root diam: 3.20 cm MITRAL VALVE                        TRICUSPID VALVE MV Area (PHT): 3.77 cm             TR Peak grad:   31.4 mmHg MV PHT:        58.29 msec           TR Vmax:        280.00 cm/s MV Decel Time: 201 msec MR Peak grad: 91.4 mmHg             SHUNTS MR Vmax:      478.00 cm/s           Systemic Diam: 2.10 cm MV E velocity: 80.60 cm/s 103 cm/s MV A velocity: 92.10 cm/s 70.3 cm/s MV E/A ratio:  0.88       1.5  Forest O'Neal MD Electronically signed by Woodlyn O'Neal MD Signature Date/Time: 07/22/2019/4:57:01 PM    Final      ELIGIBLE FOR AVAILABLE RESEARCH PROTOCOL: UPBEAT  ASSESSMENT: 63 y.o. [place], [place] woman status post right breast upper outer quadrant biopsy 03/11/2019 for a clinical t1b N0, stage IA invasive ductal carcinoma, grade 2, estrogen and progesterone receptor negative, HER-2 amplified, with an MIB-1 of 10%.  (1) genetics testing 03/30/2019: declined by patient  (2) Right breast lumpectomy on 05/29/2019: pT1b pN0, stage IA invasive ductal carciinoma, grade 2,   (a) a total of 5 lymph nodes were negative for carcinoma  (b) superior margin positive, cleared with addition surgery on 06/10/2019   (c) surgery delayed due to abnormal EKG, patient cleared by Dr. Berry   on 04/21/2019  (3) adjuvant chemotherapy to consist of Abraxane weekly x12 with Trastuzumab, starting 07/10/2019  (a) Abraxane given due to diabetes, and inability to tolerate steroid premedication required with Paclitaxel.  (4) trastuzumab to be continued to complete 1 year  (a) baseline echo 04/06/2019 shows an ejection fraction greater than 65%  (5) adjuvant radiation to  follow.  PLAN: Megan Khan continues on treatment with Abraxane and Trastuzumab. She is here today for cycle 5 of therapy.  She will proceed with this.  I reviewed her CBC with her in detail.  Her CMP is pending.  Megan Khan's diarrhea is controlled with Imodium she will continue this PRN.    Megan Khan is tolerating her treatment quite well.  She has not developed peripheral neuropathy which we are monitoring her closely for.    Megan Khan will return next week for labs and treatment.  We will see her in 2 weeks for her treatment.  She was recommended to continue with the appropriate pandemic precautions. She knows to call for any questions that may arise between now and her next appointment.  We are happy to see her sooner if needed.  A total of (15) minutes of face-to-face time was spent with this patient with greater than 50% of that time in counseling and care-coordination.   Lindsey Causey, NP  08/06/19 9:46 AM Medical Oncology and Hematology  [facility] [address] [place], [place] [address] Tel. [phone]    Fax. [phone]     

## 2019-08-06 NOTE — Patient Instructions (Signed)
[facility] Discharge Instructions for Patients Receiving Chemotherapy  Today you received the following chemotherapy agents: Trastuzumab and Paclitaxel-protein bound.  To help prevent nausea and vomiting after your treatment, we encourage you to take your nausea medication as directed by your MD.   If you develop nausea and vomiting that is not controlled by your nausea medication, call the clinic.   BELOW ARE SYMPTOMS THAT SHOULD BE REPORTED IMMEDIATELY:  *FEVER GREATER THAN 100.5 F  *CHILLS WITH OR WITHOUT FEVER  NAUSEA AND VOMITING THAT IS NOT CONTROLLED WITH YOUR NAUSEA MEDICATION  *UNUSUAL SHORTNESS OF BREATH  *UNUSUAL BRUISING OR BLEEDING  TENDERNESS IN MOUTH AND THROAT WITH OR WITHOUT PRESENCE OF ULCERS  *URINARY PROBLEMS  *BOWEL PROBLEMS  UNUSUAL RASH Items with * indicate a potential emergency and should be followed up as soon as possible.  Feel free to call the clinic should you have any questions or concerns. The clinic phone number is [phone].  Please show the [facility] at check-in to the Emergency Department and triage nurse. Coronavirus (COVID-19) Are you at risk?  Are you at risk for the Coronavirus (COVID-19)?

## 2019-08-10 ENCOUNTER — Other Ambulatory Visit: Payer: Self-pay | Admitting: Adult Health

## 2019-08-10 ENCOUNTER — Telehealth: Payer: Self-pay | Admitting: Family Medicine

## 2019-08-10 DIAGNOSIS — C50411 Malignant neoplasm of upper-outer quadrant of right female breast: Secondary | ICD-10-CM

## 2019-08-10 DIAGNOSIS — Z171 Estrogen receptor negative status [ER-]: Secondary | ICD-10-CM

## 2019-08-10 NOTE — Telephone Encounter (Signed)
Pt is calling and would like for Dr. Criss Rosales to call her back. She said she did not know it was Dr. Criss Rosales calling earlier and after checking the number she realized it was our office.   The best call back number is [phone].

## 2019-08-12 ENCOUNTER — Inpatient Hospital Stay: Payer: Medicare Other

## 2019-08-12 ENCOUNTER — Other Ambulatory Visit: Payer: Self-pay

## 2019-08-12 VITALS — BP 169/91 | HR 62 | Temp 98.0°F | Resp 18

## 2019-08-12 DIAGNOSIS — Z171 Estrogen receptor negative status [ER-]: Secondary | ICD-10-CM

## 2019-08-12 DIAGNOSIS — C50411 Malignant neoplasm of upper-outer quadrant of right female breast: Secondary | ICD-10-CM

## 2019-08-12 DIAGNOSIS — Z95828 Presence of other vascular implants and grafts: Secondary | ICD-10-CM

## 2019-08-12 DIAGNOSIS — Z5112 Encounter for antineoplastic immunotherapy: Secondary | ICD-10-CM | POA: Diagnosis not present

## 2019-08-12 LAB — CMP (CANCER CENTER ONLY)
ALT: 33 U/L (ref 0–44)
AST: 18 U/L (ref 15–41)
Albumin: 4.2 g/dL (ref 3.5–5.0)
Alkaline Phosphatase: 78 U/L (ref 38–126)
Anion gap: 12 (ref 5–15)
BUN: 8 mg/dL (ref 8–23)
CO2: 26 mmol/L (ref 22–32)
Calcium: 9.8 mg/dL (ref 8.9–10.3)
Chloride: 103 mmol/L (ref 98–111)
Creatinine: 0.78 mg/dL (ref 0.44–1.00)
GFR, Est AFR Am: >60 mL/min (ref >60)
GFR, Estimated: >60 mL/min (ref >60)
Glucose, Bld: 119 mg/dL — ABNORMAL HIGH (ref 70–99)
Potassium: 3.8 mmol/L (ref 3.5–5.1)
Sodium: 141 mmol/L (ref 135–145)
Total Bilirubin: 0.3 mg/dL (ref 0.3–1.2)
Total Protein: 6.9 g/dL (ref 6.5–8.1)

## 2019-08-12 LAB — CBC WITH DIFFERENTIAL (CANCER CENTER ONLY)
Abs Immature Granulocytes: 0.02 10*3/uL (ref 0.00–0.07)
Basophils Absolute: 0.1 10*3/uL (ref 0.0–0.1)
Basophils Relative: 2 %
Eosinophils Absolute: 0.4 10*3/uL (ref 0.0–0.5)
Eosinophils Relative: 7 %
HCT: 37.4 % (ref 36.0–46.0)
Hemoglobin: 11.8 g/dL — ABNORMAL LOW (ref 12.0–15.0)
Immature Granulocytes: 0 %
Lymphocytes Relative: 51 %
Lymphs Abs: 2.4 10*3/uL (ref 0.7–4.0)
MCH: 26.8 pg (ref 26.0–34.0)
MCHC: 31.6 g/dL (ref 30.0–36.0)
MCV: 85 fL (ref 80.0–100.0)
Monocytes Absolute: 0.3 10*3/uL (ref 0.1–1.0)
Monocytes Relative: 7 %
Neutro Abs: 1.6 10*3/uL — ABNORMAL LOW (ref 1.7–7.7)
Neutrophils Relative %: 33 %
Platelet Count: 228 10*3/uL (ref 150–400)
RBC: 4.4 MIL/uL (ref 3.87–5.11)
RDW: 15 % (ref 11.5–15.5)
WBC Count: 4.7 10*3/uL (ref 4.0–10.5)
nRBC: 0 % (ref 0.0–0.2)

## 2019-08-12 MED ORDER — PROCHLORPERAZINE MALEATE 10 MG PO TABS
ORAL_TABLET | ORAL | Status: AC
  Filled 2019-08-12: qty 1

## 2019-08-12 MED ORDER — SODIUM CHLORIDE 0.9% FLUSH
10.0000 mL | INTRAVENOUS | Status: DC | PRN
  Administered 2019-08-12: 10 mL
  Filled 2019-08-12: qty 10

## 2019-08-12 MED ORDER — PROCHLORPERAZINE EDISYLATE 10 MG/2ML IJ SOLN
INTRAMUSCULAR | Status: AC
  Filled 2019-08-12: qty 2

## 2019-08-12 MED ORDER — TRASTUZUMAB-DKST CHEMO 150 MG IV SOLR
150.0000 mg | Freq: Once | INTRAVENOUS | Status: AC
  Administered 2019-08-12: 150 mg via INTRAVENOUS
  Filled 2019-08-12: qty 7.14

## 2019-08-12 MED ORDER — DIPHENHYDRAMINE HCL 50 MG/ML IJ SOLN
INTRAMUSCULAR | Status: AC
  Filled 2019-08-12: qty 1

## 2019-08-12 MED ORDER — HEPARIN SOD (PORK) LOCK FLUSH 100 UNIT/ML IV SOLN
500.0000 [IU] | Freq: Once | INTRAVENOUS | Status: AC | PRN
  Administered 2019-08-12: 500 [IU]
  Filled 2019-08-12: qty 5

## 2019-08-12 MED ORDER — ACETAMINOPHEN 325 MG PO TABS
ORAL_TABLET | ORAL | Status: AC
  Filled 2019-08-12: qty 2

## 2019-08-12 MED ORDER — ACETAMINOPHEN 325 MG PO TABS
650.0000 mg | ORAL_TABLET | Freq: Once | ORAL | Status: AC
  Administered 2019-08-12: 650 mg via ORAL

## 2019-08-12 MED ORDER — PACLITAXEL PROTEIN-BOUND CHEMO INJECTION 100 MG
100.0000 mg/m2 | Freq: Once | INTRAVENOUS | Status: AC
  Administered 2019-08-12: 175 mg via INTRAVENOUS
  Filled 2019-08-12: qty 35

## 2019-08-12 MED ORDER — SODIUM CHLORIDE 0.9 % IV SOLN
Freq: Once | INTRAVENOUS | Status: AC
  Filled 2019-08-12: qty 250

## 2019-08-12 MED ORDER — PROCHLORPERAZINE EDISYLATE 10 MG/2ML IJ SOLN
10.0000 mg | Freq: Once | INTRAMUSCULAR | Status: AC
  Administered 2019-08-12: 10 mg via INTRAVENOUS

## 2019-08-12 MED ORDER — DIPHENHYDRAMINE HCL 50 MG/ML IJ SOLN
25.0000 mg | Freq: Once | INTRAMUSCULAR | Status: AC
  Administered 2019-08-12: 25 mg via INTRAVENOUS

## 2019-08-12 MED ORDER — FAMOTIDINE IN NACL 20-0.9 MG/50ML-% IV SOLN
20.0000 mg | Freq: Once | INTRAVENOUS | Status: AC
  Administered 2019-08-12: 20 mg via INTRAVENOUS

## 2019-08-12 MED ORDER — FAMOTIDINE IN NACL 20-0.9 MG/50ML-% IV SOLN
INTRAVENOUS | Status: AC
  Filled 2019-08-12: qty 50

## 2019-08-12 MED ORDER — SODIUM CHLORIDE 0.9% FLUSH
10.0000 mL | Freq: Once | INTRAVENOUS | Status: AC
  Administered 2019-08-12: 10 mL
  Filled 2019-08-12: qty 10

## 2019-08-12 NOTE — Patient Instructions (Signed)
[facility] Discharge Instructions for Patients Receiving Chemotherapy  Today you received the following chemotherapy agents: Trastuzumab, Paclitaxel-protein bound  To help prevent nausea and vomiting after your treatment, we encourage you to take your nausea medication as directed by your MD.   If you develop nausea and vomiting that is not controlled by your nausea medication, call the clinic.   BELOW ARE SYMPTOMS THAT SHOULD BE REPORTED IMMEDIATELY:  *FEVER GREATER THAN 100.5 F  *CHILLS WITH OR WITHOUT FEVER  NAUSEA AND VOMITING THAT IS NOT CONTROLLED WITH YOUR NAUSEA MEDICATION  *UNUSUAL SHORTNESS OF BREATH  *UNUSUAL BRUISING OR BLEEDING  TENDERNESS IN MOUTH AND THROAT WITH OR WITHOUT PRESENCE OF ULCERS  *URINARY PROBLEMS  *BOWEL PROBLEMS  UNUSUAL RASH Items with * indicate a potential emergency and should be followed up as soon as possible.  Feel free to call the clinic should you have any questions or concerns. The clinic phone number is [phone].  Please show the [facility] at check-in to the Emergency Department and triage nurse. Coronavirus (COVID-19) Are you at risk?  Are you at risk for the Coronavirus (COVID-19)?  To be considered HIGH RISK for Coronavirus (COVID-19), you have to meet the following criteria:  . Traveled to [place], [place], [place], [place] or [place]; or in the [place] to [place], [place], [place], or [place]; and have fever, cough, and shortness of breath within the last 2 weeks of travel OR . Been in close contact with a person diagnosed with COVID-19 within the last 2 weeks and have fever, cough, and shortness of breath . IF YOU DO NOT MEET THESE CRITERIA, YOU ARE CONSIDERED LOW RISK FOR COVID-19.  What to do if you are HIGH RISK for COVID-19?  Marland Kitchen If you are having a medical emergency, call 911. . Seek medical care right away. Before you go to a doctor's office, urgent care or emergency department,  call ahead and tell them about your recent travel, contact with someone diagnosed with COVID-19, and your symptoms. You should receive instructions from your physician's office regarding next steps of care.  . When you arrive at healthcare provider, tell the healthcare staff immediately you have returned from visiting [place], [place], [place], [place] or [place]; or traveled in the [place] to [place], [place], [place], or [place]; in the last two weeks or you have been in close contact with a person diagnosed with COVID-19 in the last 2 weeks.   . Tell the health care staff about your symptoms: fever, cough and shortness of breath. . After you have been seen by a medical provider, you will be either: o Tested for (COVID-19) and discharged home on quarantine except to seek medical care if symptoms worsen, and asked to  - Stay home and avoid contact with others until you get your results (4-5 days)  - Avoid travel on public transportation if possible (such as bus, train, or airplane) or o Sent to the Emergency Department by EMS for evaluation, COVID-19 testing, and possible admission depending on your condition and test results.  What to do if you are LOW RISK for COVID-19?  Reduce your risk of any infection by using the same precautions used for avoiding the common cold or flu:  Marland Kitchen Wash your hands often with soap and warm water for at least 20 seconds.  If soap and water are not readily available, use an alcohol-based hand sanitizer with at least 60% alcohol.  Marland Kitchen  If coughing or sneezing, cover your mouth and nose by coughing or sneezing into the elbow areas of your shirt or coat, into a tissue or into your sleeve (not your hands). . Avoid shaking hands with others and consider head nods or verbal greetings only. . Avoid touching your eyes, nose, or mouth with unwashed hands.  . Avoid close contact with people who are sick. . Avoid places or events with large numbers of people in one  location, like concerts or sporting events. . Carefully consider travel plans you have or are making. . If you are planning any travel outside or inside the [place], visit the CDC's Travelers' Health webpage for the latest health notices. . If you have some symptoms but not all symptoms, continue to monitor at home and seek medical attention if your symptoms worsen. . If you are having a medical emergency, call 911.   [facility] / e-Visit: [email or web address]         [facility]: [facility]: [phone]                   [facility]: [phone]

## 2019-08-14 ENCOUNTER — Other Ambulatory Visit: Payer: Self-pay | Admitting: Adult Health

## 2019-08-14 DIAGNOSIS — Z171 Estrogen receptor negative status [ER-]: Secondary | ICD-10-CM

## 2019-08-14 DIAGNOSIS — C50411 Malignant neoplasm of upper-outer quadrant of right female breast: Secondary | ICD-10-CM

## 2019-08-18 ENCOUNTER — Other Ambulatory Visit: Payer: Self-pay | Admitting: Family Medicine

## 2019-08-18 ENCOUNTER — Other Ambulatory Visit: Payer: Self-pay | Admitting: *Deleted

## 2019-08-18 DIAGNOSIS — G8929 Other chronic pain: Secondary | ICD-10-CM

## 2019-08-20 ENCOUNTER — Encounter: Payer: Self-pay | Admitting: *Deleted

## 2019-08-20 ENCOUNTER — Other Ambulatory Visit: Payer: Self-pay

## 2019-08-20 ENCOUNTER — Inpatient Hospital Stay: Payer: Medicare Other

## 2019-08-20 ENCOUNTER — Encounter: Payer: Self-pay | Admitting: Adult Health

## 2019-08-20 ENCOUNTER — Inpatient Hospital Stay (HOSPITAL_BASED_OUTPATIENT_CLINIC_OR_DEPARTMENT_OTHER): Payer: Medicare Other | Admitting: Adult Health

## 2019-08-20 ENCOUNTER — Inpatient Hospital Stay: Payer: Medicare Other | Attending: Oncology

## 2019-08-20 VITALS — BP 125/83 | HR 72 | Temp 97.4°F | Resp 17 | Ht 61.0 in | Wt 161.7 lb

## 2019-08-20 DIAGNOSIS — C50411 Malignant neoplasm of upper-outer quadrant of right female breast: Secondary | ICD-10-CM | POA: Diagnosis present

## 2019-08-20 DIAGNOSIS — Z171 Estrogen receptor negative status [ER-]: Secondary | ICD-10-CM

## 2019-08-20 DIAGNOSIS — Z5112 Encounter for antineoplastic immunotherapy: Secondary | ICD-10-CM | POA: Diagnosis present

## 2019-08-20 DIAGNOSIS — Z5111 Encounter for antineoplastic chemotherapy: Secondary | ICD-10-CM | POA: Insufficient documentation

## 2019-08-20 DIAGNOSIS — E119 Type 2 diabetes mellitus without complications: Secondary | ICD-10-CM | POA: Insufficient documentation

## 2019-08-20 DIAGNOSIS — Z95828 Presence of other vascular implants and grafts: Secondary | ICD-10-CM

## 2019-08-20 LAB — CMP (CANCER CENTER ONLY)
ALT: 33 U/L (ref 0–44)
AST: 18 U/L (ref 15–41)
Albumin: 4.1 g/dL (ref 3.5–5.0)
Alkaline Phosphatase: 76 U/L (ref 38–126)
Anion gap: 9 (ref 5–15)
BUN: 15 mg/dL (ref 8–23)
CO2: 28 mmol/L (ref 22–32)
Calcium: 9.1 mg/dL (ref 8.9–10.3)
Chloride: 106 mmol/L (ref 98–111)
Creatinine: 0.84 mg/dL (ref 0.44–1.00)
GFR, Est AFR Am: >60 mL/min (ref >60)
GFR, Estimated: >60 mL/min (ref >60)
Glucose, Bld: 139 mg/dL — ABNORMAL HIGH (ref 70–99)
Potassium: 3.7 mmol/L (ref 3.5–5.1)
Sodium: 143 mmol/L (ref 135–145)
Total Bilirubin: 0.3 mg/dL (ref 0.3–1.2)
Total Protein: 6.9 g/dL (ref 6.5–8.1)

## 2019-08-20 LAB — CBC WITH DIFFERENTIAL (CANCER CENTER ONLY)
Abs Immature Granulocytes: 0.04 10*3/uL (ref 0.00–0.07)
Basophils Absolute: 0.1 10*3/uL (ref 0.0–0.1)
Basophils Relative: 2 %
Eosinophils Absolute: 0.4 10*3/uL (ref 0.0–0.5)
Eosinophils Relative: 6 %
HCT: 36.6 % (ref 36.0–46.0)
Hemoglobin: 11.8 g/dL — ABNORMAL LOW (ref 12.0–15.0)
Immature Granulocytes: 1 %
Lymphocytes Relative: 38 %
Lymphs Abs: 2.5 10*3/uL (ref 0.7–4.0)
MCH: 27.6 pg (ref 26.0–34.0)
MCHC: 32.2 g/dL (ref 30.0–36.0)
MCV: 85.5 fL (ref 80.0–100.0)
Monocytes Absolute: 0.6 10*3/uL (ref 0.1–1.0)
Monocytes Relative: 9 %
Neutro Abs: 2.8 10*3/uL (ref 1.7–7.7)
Neutrophils Relative %: 44 %
Platelet Count: 341 10*3/uL (ref 150–400)
RBC: 4.28 MIL/uL (ref 3.87–5.11)
RDW: 15.9 % — ABNORMAL HIGH (ref 11.5–15.5)
WBC Count: 6.4 10*3/uL (ref 4.0–10.5)
nRBC: 0 % (ref 0.0–0.2)

## 2019-08-20 MED ORDER — PROCHLORPERAZINE EDISYLATE 10 MG/2ML IJ SOLN
INTRAMUSCULAR | Status: AC
  Filled 2019-08-20: qty 2

## 2019-08-20 MED ORDER — HEPARIN SOD (PORK) LOCK FLUSH 100 UNIT/ML IV SOLN
500.0000 [IU] | Freq: Once | INTRAVENOUS | Status: AC | PRN
  Administered 2019-08-20: 500 [IU]
  Filled 2019-08-20: qty 5

## 2019-08-20 MED ORDER — SODIUM CHLORIDE 0.9% FLUSH
10.0000 mL | INTRAVENOUS | Status: DC | PRN
  Administered 2019-08-20: 10 mL via INTRAVENOUS
  Filled 2019-08-20: qty 10

## 2019-08-20 MED ORDER — ACETAMINOPHEN 325 MG PO TABS
ORAL_TABLET | ORAL | Status: AC
  Filled 2019-08-20: qty 2

## 2019-08-20 MED ORDER — DIPHENHYDRAMINE HCL 50 MG/ML IJ SOLN
INTRAMUSCULAR | Status: AC
  Filled 2019-08-20: qty 1

## 2019-08-20 MED ORDER — PACLITAXEL PROTEIN-BOUND CHEMO INJECTION 100 MG
100.0000 mg/m2 | Freq: Once | INTRAVENOUS | Status: AC
  Administered 2019-08-20: 175 mg via INTRAVENOUS
  Filled 2019-08-20: qty 35

## 2019-08-20 MED ORDER — SODIUM CHLORIDE 0.9% FLUSH
10.0000 mL | INTRAVENOUS | Status: DC | PRN
  Administered 2019-08-20: 15:00:00 10 mL
  Filled 2019-08-20: qty 10

## 2019-08-20 MED ORDER — ACETAMINOPHEN 325 MG PO TABS
650.0000 mg | ORAL_TABLET | Freq: Once | ORAL | Status: AC
  Administered 2019-08-20: 650 mg via ORAL

## 2019-08-20 MED ORDER — TRASTUZUMAB-DKST CHEMO 150 MG IV SOLR
150.0000 mg | Freq: Once | INTRAVENOUS | Status: AC
  Administered 2019-08-20: 150 mg via INTRAVENOUS
  Filled 2019-08-20: qty 7.14

## 2019-08-20 MED ORDER — PROCHLORPERAZINE EDISYLATE 10 MG/2ML IJ SOLN
10.0000 mg | Freq: Once | INTRAMUSCULAR | Status: AC
  Administered 2019-08-20: 10 mg via INTRAVENOUS

## 2019-08-20 MED ORDER — FAMOTIDINE IN NACL 20-0.9 MG/50ML-% IV SOLN
INTRAVENOUS | Status: AC
  Filled 2019-08-20: qty 50

## 2019-08-20 MED ORDER — DIPHENHYDRAMINE HCL 50 MG/ML IJ SOLN
25.0000 mg | Freq: Once | INTRAMUSCULAR | Status: AC
  Administered 2019-08-20: 25 mg via INTRAVENOUS

## 2019-08-20 MED ORDER — FAMOTIDINE IN NACL 20-0.9 MG/50ML-% IV SOLN
20.0000 mg | Freq: Once | INTRAVENOUS | Status: AC
  Administered 2019-08-20: 20 mg via INTRAVENOUS

## 2019-08-20 MED ORDER — SODIUM CHLORIDE 0.9 % IV SOLN
Freq: Once | INTRAVENOUS | Status: AC
  Filled 2019-08-20: qty 250

## 2019-08-20 NOTE — Patient Instructions (Signed)

## 2019-08-20 NOTE — Telephone Encounter (Signed)
Tried to call pt. Phone number not working at this time. Will keep trying pt. Megan Khan, CMA

## 2019-08-20 NOTE — Progress Notes (Signed)
[facility]  [phone] [phone]    ID: CORBY VILLASENOR DOB: 1956/05/18  MR#: [id number]  EUM#:[id number]  Patient Care Team: [name], DO as PCP - General [name], [name], MD as PCP - Advanced Heart Failure (Cardiology) [name], [name], MD as Consulting Physician (Oncology) Fanny Skates, MD as Consulting Physician (General Surgery) [name], MD as Consulting Physician (Radiation Oncology) Wallene Huh, DPM as Consulting Physician (Podiatry) [name], MD as Consulting Physician (Ophthalmology) [name], MD as Consulting Physician (Gastroenterology) [name], RN as Oncology Nurse Navigator [name], RN as Oncology Nurse Navigator OTHER MD:   CHIEF COMPLAINT: Estrogen receptor negative breast cancer  CURRENT TREATMENT: Abraxane/Trastuzumab   INTERVAL HISTORY: Megan Khan is here for follow up of her HER-2 positive breast cancer accompanied by her son.  She began adjuvant chemotherapy with Abraxane and Trastuzumab (ogivri which is biosimilar) on 07/10/2019. She will receive this weekly x 12 weeks, and then will go onto receive Trastuzumab every 3 weeks to complete1 year of therapy.    Today is day 1 cycle 7 of her chemoimmunotherapy.  She notes that she is tolerating her treatment moderately well.  She has no peripheral neuropathy.   Her most recent echocardiogram was on 07/22/2019 and showed an EF of 65-70%.   REVIEW OF SYSTEMS: [name] is doing well today.  She denies any fever, chills, chest pain, palpitations, cough, shortness of breath, headaches, vision issues, bowel/bladder changes, nausea, vomiting, or any other concerns.  She is remaining active.  A detailed ROS was otherwise non contributory today.     HISTORY OF CURRENT ILLNESS: From the original intake note:  [name] had routine screening mammography on 02/24/2019 showing a possible abnormality in the right breast. She underwent  unilateral right diagnostic mammography with tomography and right breast ultrasonography at [facility] on 03/04/2019 showing: Breast Density Category B. Additional imaging of the right breast was performed. There is a persistent asymmetry and distortion in the middle third of the upper-outer quadrant of the breast. There are no malignant type microcalcifications. On physical exam, I do not palpate a mass in the upper-outer quadrant of the right breast. Targeted ultrasound is performed, showing an irregular hypoechoic mass in the right breast 11 o'clock 7 cm from the nipple measuring 0.7 x 0.7 x 1.0 cm. Sonographic evaluation of the right axilla does not show any enlarged adenopathy.  Accordingly on 03/11/2019 she proceeded to biopsy of the right breast area in question. The pathology from this procedure showed ([id number]): invasive ductal carcinoma, grade II, 11 o'clock 7 cm from the nipple. Prognostic indicators significant for: estrogen receptor, 0% negative and progesterone receptor, 0% negative. Proliferation marker Ki67 at 10%. HER2 positive (3+) by immunohistochemistry.  The patient's subsequent history is as detailed below.    PAST MEDICAL HISTORY: Past Medical History:  Diagnosis Date  . Anemia    during pregnancy  . Cancer [facility]) 2020   breast cancer on right  . Diabetes mellitus    Type 2  . Family history of breast cancer   . Family history of throat cancer   . Glaucoma   . Glaucoma   . Hx of colonic polyps   . Hyperlipidemia   . Hypertension   . Legally blind    completely blind in left eye, low vision in right    PAST SURGICAL HISTORY: Past Surgical History:  Procedure Laterality Date  . BREAST LUMPECTOMY WITH RADIOACTIVE SEED AND SENTINEL LYMPH  NODE BIOPSY Right 05/29/2019   Procedure: RIGHT BREAST LUMPECTOMY WITH RADIOACTIVE SEED AND RIGHT DEEP AXILLARY SENTINEL LYMPH NODE BIOPSY INJECT BLUE DYE;  Surgeon: Fanny Skates, MD;  Location: [facility];  Service: General;   Laterality: Right;  . COLONOSCOPY  01/22/2012   Procedure: COLONOSCOPY;  Surgeon: [name], MD;  Location: [facility];  Service: Endoscopy;  Laterality: N/A;  . COLONOSCOPY    . COLONOSCOPY    . PORTACATH PLACEMENT N/A 05/29/2019   Procedure: INSERTION PORT-A-CATH WITH ULTRASOUND;  Surgeon: Fanny Skates, MD;  Location: [facility];  Service: General;  Laterality: N/A;  . RE-EXCISION OF BREAST LUMPECTOMY Right 06/10/2019   Procedure: RE-EXCISION OF RIGHT BREAST LUMPECTOMY MARGINS;  Surgeon: Fanny Skates, MD;  Location: [facility];  Service: General;  Laterality: Right;  . TUBAL LIGATION       FAMILY HISTORY: Family History  Problem Relation Age of Onset  . Breast cancer Sister 38       Her2 +  . Breast cancer Cousin        maternal second cousin, Bilateral mastectomy, diagnosed in her 51s   Glennis's father died from natural causes at age 55. Patients' mother died from natural causes at age 3. The patient has 5 brothers and 3 sisters.  The patient's sister, Megan Khan, is my patient diagnosed with breast cancer at 2.  The cyst cancer was estrogen receptor positive, HER-2 negative.  She received no chemo.  Hara's maternal 2nd cousin was diagnosed with breast cancer. Patient denies anyone in her family having ovarian, prostate, or pancreatic cancer.    GYNECOLOGIC HISTORY:  No LMP recorded. Patient is postmenopausal. Menarche: 64 years old Age at first live birth: 64 years old GX P: 3 LMP: 67 Contraceptive: no HRT: no  Hysterectomy?: no BSO?: no   SOCIAL HISTORY: (Current as of 03/30/2019) Karris is disabled secondary to her glaucoma.she is legally blind.  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 She is divorced. She lives with her son, Megan Khan who is 64 and is disabled with cerebral palsy.  He does attend school. Her son Megan Khan was killed (gunshot wound) age 42.Marland Kitchen Her son, Megan Khan, is 55 and works for this [employer]. Her daughter, Megan Khan, is 79 and  is a Quar[employer] manager. Sherrise has 7 grandchildren and 2 great-grandchildren.  She attends the [employer]: Not in place. She has verbally named her sister, Megan Khan, as her healthcare power of attorney.  At the 03/30/2019 visit she was given the appropriate documents to complete and notarize at her discretion  HEALTH MAINTENANCE: Social History   Tobacco Use  . Smoking status: Former Smoker    Types: Cigarettes    Quit date: 09/23/1995    Years since quitting: 23.9  . Smokeless tobacco: Never Used  Substance Use Topics  . Alcohol use: No  . Drug use: No    Colonoscopy: June 2013/again in  PAP: Per Dr. Criss Rosales  Bone density: Never  Allergies  Allergen Reactions  . Sulfamethoxazole Rash    REACTION: Questionable allergy ?    Current Outpatient Medications  Medication Sig Dispense Refill  . amLODipine (NORVASC) 10 MG tablet TAKE 1 TABLET(10 MG) BY MOUTH DAILY 90 tablet 3  . bimatoprost (LUMIGAN) 0.01 % SOLN Place 2 drops into the left eye at bedtime.     . blood glucose meter kit and supplies KIT Dispense based on patient and insurance preference. Use  up to four times daily as directed. (FOR ICD-9 250.00, 250.01). 1 each 12  . dorzolamide (TRUSOPT) 2 % ophthalmic solution Place 2 drops into the left eye 3 (three) times daily.     Marland Kitchen gabapentin (NEURONTIN) 100 MG capsule TAKE 2 CAPSULES(200 MG) BY MOUTH THREE TIMES DAILY 180 capsule 0  . lidocaine-prilocaine (EMLA) cream Apply to affected area once 30 g 3  . lisinopril-hydrochlorothiazide (ZESTORETIC) 20-25 MG tablet Take 1 tablet by mouth daily. 90 tablet 3  . lovastatin (MEVACOR) 20 MG tablet TAKE 1 TABLET BY MOUTH EVERY NIGHT AT BEDTIME 90 tablet 3  . magic mouthwash SOLN Take 5 mLs by mouth.    . metFORMIN (GLUCOPHAGE) 1000 MG tablet Take 1 tablet (1,000 mg total) by mouth 2 (two) times daily with a meal. 360 tablet 1  . metoprolol succinate (TOPROL-XL) 50 MG 24 hr tablet TAKE 1 TABLET(50 MG) BY MOUTH  DAILY 90 tablet 0  . prochlorperazine (COMPAZINE) 10 MG tablet TAKE 1 TABLET(10 MG) BY MOUTH EVERY 6 HOURS AS NEEDED FOR NAUSEA OR VOMITING 30 tablet 1   No current facility-administered medications for this visit.     OBJECTIVE: Vitals:   08/20/19 1137  BP: 125/83  Pulse: 72  Resp: 17  Temp: (!) 97.4 F (36.3 C)  SpO2: 100%     Body mass index is 30.55 kg/m.   Wt Readings from Last 3 Encounters:  08/20/19 161 lb 11.2 oz (73.3 kg)  08/06/19 160 lb 9.6 oz (72.8 kg)  07/30/19 161 lb 8 oz (73.3 kg)  ECOG FS:1 - Symptomatic but completely ambulatory GENERAL: Patient is a well appearing female in no acute distress HEENT:  Sclerae anicteric.  Mask in place Neck is supple.  NODES:  No cervical, supraclavicular, or axillary lymphadenopathy palpated.  BREAST EXAM:  Deferred. LUNGS:  Clear to auscultation bilaterally.  No wheezes or rhonchi. HEART:  Regular rate and rhythm. No murmur appreciated. ABDOMEN:  Soft, nontender.  Positive, normoactive bowel sounds. No organomegaly palpated. MSK:  No focal spinal tenderness to palpation. Full range of motion bilaterally in the upper extremities. EXTREMITIES:  No peripheral edema.   SKIN:  Clear with no obvious rashes or skin changes. No nail dyscrasia. NEURO:  Nonfocal. Well oriented.  Appropriate affect.    LAB RESULTS:  CMP     Component Value Date/Time   NA 143 08/20/2019 1109   NA 142 05/27/2018 1533   K 3.7 08/20/2019 1109   CL 106 08/20/2019 1109   CO2 28 08/20/2019 1109   GLUCOSE 139 (H) 08/20/2019 1109   BUN 15 08/20/2019 1109   BUN 19 05/27/2018 1533   CREATININE 0.84 08/20/2019 1109   CREATININE 0.79 01/30/2016 1130   CALCIUM 9.1 08/20/2019 1109   PROT 6.9 08/20/2019 1109   ALBUMIN 4.1 08/20/2019 1109   AST 18 08/20/2019 1109   ALT 33 08/20/2019 1109   ALKPHOS 76 08/20/2019 1109   BILITOT 0.3 08/20/2019 1109   GFRNONAA >60 08/20/2019 1109   GFRNONAA 82 01/30/2016 1130   GFRAA >60 08/20/2019 1109   GFRAA >89  01/30/2016 1130    No results found for: TOTALPROTELP, ALBUMINELP, A1GS, A2GS, BETS, BETA2SER, GAMS, MSPIKE, SPEI  No results found for: KPAFRELGTCHN, LAMBDASER, KAPLAMBRATIO  Lab Results  Component Value Date   WBC 6.4 08/20/2019   NEUTROABS 2.8 08/20/2019   HGB 11.8 (L) 08/20/2019   HCT 36.6 08/20/2019   MCV 85.5 08/20/2019   PLT 341 08/20/2019    No results found for: LABCA2  No components found for: EYCXKG818  No results for input(s): INR in the last 168 hours.  No results found for: LABCA2  No results found for: HUD149  No results found for: FWY637  No results found for: CHY850  No results found for: CA2729  No components found for: HGQUANT  No results found for: CEA1 / No results found for: CEA1   No results found for: AFPTUMOR  No results found for: CHROMOGRNA  No results found for: PSA1  Appointment on 08/20/2019  Component Date Value Ref Range Status  . WBC Count 08/20/2019 6.4  4.0 - 10.5 K/uL Final  . RBC 08/20/2019 4.28  3.87 - 5.11 MIL/uL Final  . Hemoglobin 08/20/2019 11.8* 12.0 - 15.0 g/dL Final  . HCT 08/20/2019 36.6  36.0 - 46.0 % Final  . MCV 08/20/2019 85.5  80.0 - 100.0 fL Final  . MCH 08/20/2019 27.6  26.0 - 34.0 pg Final  . MCHC 08/20/2019 32.2  30.0 - 36.0 g/dL Final  . RDW 08/20/2019 15.9* 11.5 - 15.5 % Final  . Platelet Count 08/20/2019 341  150 - 400 K/uL Final  . nRBC 08/20/2019 0.0  0.0 - 0.2 % Final  . Neutrophils Relative % 08/20/2019 44  % Final  . Neutro Abs 08/20/2019 2.8  1.7 - 7.7 K/uL Final  . Lymphocytes Relative 08/20/2019 38  % Final  . Lymphs Abs 08/20/2019 2.5  0.7 - 4.0 K/uL Final  . Monocytes Relative 08/20/2019 9  % Final  . Monocytes Absolute 08/20/2019 0.6  0.1 - 1.0 K/uL Final  . Eosinophils Relative 08/20/2019 6  % Final  . Eosinophils Absolute 08/20/2019 0.4  0.0 - 0.5 K/uL Final  . Basophils Relative 08/20/2019 2  % Final  . Basophils Absolute 08/20/2019 0.1  0.0 - 0.1 K/uL Final  . Immature  Granulocytes 08/20/2019 1  % Final  . Abs Immature Granulocytes 08/20/2019 0.04  0.00 - 0.07 K/uL Final   Performed at [facility], [place] [address] [place], [place] [address]  . Sodium 08/20/2019 143  135 - 145 mmol/L Final  . Potassium 08/20/2019 3.7  3.5 - 5.1 mmol/L Final  . Chloride 08/20/2019 106  98 - 111 mmol/L Final  . CO2 08/20/2019 28  22 - 32 mmol/L Final  . Glucose, Bld 08/20/2019 139* 70 - 99 mg/dL Final  . BUN 08/20/2019 15  8 - 23 mg/dL Final  . Creatinine 08/20/2019 0.84  0.44 - 1.00 mg/dL Final  . Calcium 08/20/2019 9.1  8.9 - 10.3 mg/dL Final  . Total Protein 08/20/2019 6.9  6.5 - 8.1 g/dL Final  . Albumin 08/20/2019 4.1  3.5 - 5.0 g/dL Final  . AST 08/20/2019 18  15 - 41 U/L Final  . ALT 08/20/2019 33  0 - 44 U/L Final  . Alkaline Phosphatase 08/20/2019 76  38 - 126 U/L Final  . Total Bilirubin 08/20/2019 0.3  0.3 - 1.2 mg/dL Final  . GFR, Est Non Af Am 08/20/2019 >60  >60 mL/min Final  . GFR, Est AFR Am 08/20/2019 >60  >60 mL/min Final  . Anion gap 08/20/2019 9  5 - 15 Final   Performed at [facility], [place] [address] [place], [place] [address]    (this displays the last labs from the last 3 days)  No results found for: TOTALPROTELP, ALBUMINELP, A1GS, A2GS, BETS, BETA2SER, GAMS, MSPIKE, SPEI (this displays SPEP labs)  No results found for: KPAFRELGTCHN, LAMBDASER, KAPLAMBRATIO (kappa/lambda light chains)  No results found for: HGBA, HGBA2QUANT, HGBFQUANT, HGBSQUAN (Hemoglobinopathy evaluation)   No results found for: LDH  No results found for: IRON, TIBC, IRONPCTSAT (Iron and TIBC)  No results found for: FERRITIN  Urinalysis No results found for: COLORURINE, APPEARANCEUR, LABSPEC, PHURINE, GLUCOSEU, HGBUR, BILIRUBINUR, KETONESUR, PROTEINUR, UROBILINOGEN, NITRITE, LEUKOCYTESUR   STUDIES:  No results found.   ELIGIBLE FOR AVAILABLE RESEARCH PROTOCOL: UPBEAT  ASSESSMENT: 64 y.o. [place], [place] woman  status post right breast upper outer quadrant biopsy 03/11/2019 for a clinical t1b N0, stage IA invasive ductal carcinoma, grade 2, estrogen and progesterone receptor negative, HER-2 amplified, with an MIB-1 of 10%.  (1) genetics testing 03/30/2019: declined by patient  (2) Right breast lumpectomy on 05/29/2019: pT1b pN0, stage IA invasive ductal carciinoma, grade 2,   (a) a total of 5 lymph nodes were negative for carcinoma  (b) superior margin positive, cleared with addition surgery on 06/10/2019   (c) surgery delayed due to abnormal EKG, patient cleared by Dr. Gwenlyn Found on 04/21/2019  (3) adjuvant chemotherapy to consist of Abraxane weekly x12 with Trastuzumab, starting 07/10/2019  (a) Abraxane given due to diabetes, and inability to tolerate steroid premedication required with Paclitaxel.  (4) trastuzumab to be continued to complete 1 year  (a) baseline echo 04/06/2019 shows an ejection fraction greater than 65%  (b) echo on 07/22/2019 shows EF 65-70%  (5) adjuvant radiation to follow.  PLAN: Ahjanae continues on adjuvant chemo-immunotherapy with Abraxane and Trastuzumab with good tolerance.  She has not developed any peripheral neuropathy which is good.  I reviewed her labs with her today which remain stable, as well as her recent echo results that show a preserved EF.    Delona will continue on her weekly therapy.  I reviewed healthy diet and activity recommendations.    Odilia will return next week for labs and treatment.  We will see her in 2 weeks for her treatment.  She was recommended to continue with the appropriate pandemic precautions. She knows to call for any questions that may arise between now and her next appointment.  We are happy to see her sooner if needed.  A total of (20) minutes of face-to-face time was spent with this patient with greater than 50% of that time in counseling and care-coordination.   Wilber Bihari, NP  08/22/19 11:32 AM Medical Oncology and  Hematology [facility] [place], [place] [address] Tel. [phone]    Fax. [phone]

## 2019-08-20 NOTE — Patient Instructions (Signed)
[facility] Discharge Instructions for Patients Receiving Chemotherapy  Today you received the following chemotherapy agents: Trastuzumab, Paclitaxel-protein bound  To help prevent nausea and vomiting after your treatment, we encourage you to take your nausea medication as directed by your MD.   If you develop nausea and vomiting that is not controlled by your nausea medication, call the clinic.   BELOW ARE SYMPTOMS THAT SHOULD BE REPORTED IMMEDIATELY:  *FEVER GREATER THAN 100.5 F  *CHILLS WITH OR WITHOUT FEVER  NAUSEA AND VOMITING THAT IS NOT CONTROLLED WITH YOUR NAUSEA MEDICATION  *UNUSUAL SHORTNESS OF BREATH  *UNUSUAL BRUISING OR BLEEDING  TENDERNESS IN MOUTH AND THROAT WITH OR WITHOUT PRESENCE OF ULCERS  *URINARY PROBLEMS  *BOWEL PROBLEMS  UNUSUAL RASH Items with * indicate a potential emergency and should be followed up as soon as possible.  Feel free to call the clinic should you have any questions or concerns. The clinic phone number is [phone].  Please show the [facility] at check-in to the Emergency Department and triage nurse. Coronavirus (COVID-19) Are you at risk?  Are you at risk for the Coronavirus (COVID-19)?  To be considered HIGH RISK for Coronavirus (COVID-19), you have to meet the following criteria:  . Traveled to [place], [place], [place], [place] or [place]; or in the [place] to [place], [place], [place], or [place]; and have fever, cough, and shortness of breath within the last 2 weeks of travel OR . Been in close contact with a person diagnosed with COVID-19 within the last 2 weeks and have fever, cough, and shortness of breath . IF YOU DO NOT MEET THESE CRITERIA, YOU ARE CONSIDERED LOW RISK FOR COVID-19.  What to do if you are HIGH RISK for COVID-19?  Marland Kitchen If you are having a medical emergency, call 911. . Seek medical care right away. Before you go to a doctor's office, urgent care or emergency department,  call ahead and tell them about your recent travel, contact with someone diagnosed with COVID-19, and your symptoms. You should receive instructions from your physician's office regarding next steps of care.  . When you arrive at healthcare provider, tell the healthcare staff immediately you have returned from visiting [place], [place], [place], [place] or [place]; or traveled in the [place] to [place], [place], [place], or [place]; in the last two weeks or you have been in close contact with a person diagnosed with COVID-19 in the last 2 weeks.   . Tell the health care staff about your symptoms: fever, cough and shortness of breath. . After you have been seen by a medical provider, you will be either: o Tested for (COVID-19) and discharged home on quarantine except to seek medical care if symptoms worsen, and asked to  - Stay home and avoid contact with others until you get your results (4-5 days)  - Avoid travel on public transportation if possible (such as bus, train, or airplane) or o Sent to the Emergency Department by EMS for evaluation, COVID-19 testing, and possible admission depending on your condition and test results.  What to do if you are LOW RISK for COVID-19?  Reduce your risk of any infection by using the same precautions used for avoiding the common cold or flu:  Marland Kitchen Wash your hands often with soap and warm water for at least 20 seconds.  If soap and water are not readily available, use an alcohol-based hand sanitizer with at least 60% alcohol.  Marland Kitchen  If coughing or sneezing, cover your mouth and nose by coughing or sneezing into the elbow areas of your shirt or coat, into a tissue or into your sleeve (not your hands). . Avoid shaking hands with others and consider head nods or verbal greetings only. . Avoid touching your eyes, nose, or mouth with unwashed hands.  . Avoid close contact with people who are sick. . Avoid places or events with large numbers of people in one  location, like concerts or sporting events. . Carefully consider travel plans you have or are making. . If you are planning any travel outside or inside the [place], visit the CDC's Travelers' Health webpage for the latest health notices. . If you have some symptoms but not all symptoms, continue to monitor at home and seek medical attention if your symptoms worsen. . If you are having a medical emergency, call 911.   [place] / e-Visit: [email or web address]         [facility]: [facility]: [phone]                   [facility]: [phone]

## 2019-08-27 ENCOUNTER — Other Ambulatory Visit: Payer: Self-pay

## 2019-08-27 ENCOUNTER — Inpatient Hospital Stay: Payer: Medicare Other

## 2019-08-27 VITALS — BP 132/86 | HR 66 | Temp 98.3°F | Resp 18

## 2019-08-27 DIAGNOSIS — C50411 Malignant neoplasm of upper-outer quadrant of right female breast: Secondary | ICD-10-CM

## 2019-08-27 DIAGNOSIS — Z5112 Encounter for antineoplastic immunotherapy: Secondary | ICD-10-CM | POA: Diagnosis not present

## 2019-08-27 DIAGNOSIS — Z171 Estrogen receptor negative status [ER-]: Secondary | ICD-10-CM

## 2019-08-27 DIAGNOSIS — Z95828 Presence of other vascular implants and grafts: Secondary | ICD-10-CM | POA: Insufficient documentation

## 2019-08-27 LAB — CMP (CANCER CENTER ONLY)
ALT: 30 U/L (ref 0–44)
AST: 16 U/L (ref 15–41)
Albumin: 4.2 g/dL (ref 3.5–5.0)
Alkaline Phosphatase: 81 U/L (ref 38–126)
Anion gap: 11 (ref 5–15)
BUN: 11 mg/dL (ref 8–23)
CO2: 25 mmol/L (ref 22–32)
Calcium: 9.2 mg/dL (ref 8.9–10.3)
Chloride: 104 mmol/L (ref 98–111)
Creatinine: 0.85 mg/dL (ref 0.44–1.00)
GFR, Est AFR Am: >60 mL/min (ref >60)
GFR, Estimated: >60 mL/min (ref >60)
Glucose, Bld: 140 mg/dL — ABNORMAL HIGH (ref 70–99)
Potassium: 3.9 mmol/L (ref 3.5–5.1)
Sodium: 140 mmol/L (ref 135–145)
Total Bilirubin: 0.3 mg/dL (ref 0.3–1.2)
Total Protein: 7 g/dL (ref 6.5–8.1)

## 2019-08-27 LAB — CBC WITH DIFFERENTIAL (CANCER CENTER ONLY)
Abs Immature Granulocytes: 0.04 10*3/uL (ref 0.00–0.07)
Basophils Absolute: 0.1 10*3/uL (ref 0.0–0.1)
Basophils Relative: 2 %
Eosinophils Absolute: 0.4 10*3/uL (ref 0.0–0.5)
Eosinophils Relative: 7 %
HCT: 38.5 % (ref 36.0–46.0)
Hemoglobin: 12.4 g/dL (ref 12.0–15.0)
Immature Granulocytes: 1 %
Lymphocytes Relative: 38 %
Lymphs Abs: 2.1 10*3/uL (ref 0.7–4.0)
MCH: 27.4 pg (ref 26.0–34.0)
MCHC: 32.2 g/dL (ref 30.0–36.0)
MCV: 85.2 fL (ref 80.0–100.0)
Monocytes Absolute: 0.4 10*3/uL (ref 0.1–1.0)
Monocytes Relative: 8 %
Neutro Abs: 2.5 10*3/uL (ref 1.7–7.7)
Neutrophils Relative %: 44 %
Platelet Count: 353 10*3/uL (ref 150–400)
RBC: 4.52 MIL/uL (ref 3.87–5.11)
RDW: 15.7 % — ABNORMAL HIGH (ref 11.5–15.5)
WBC Count: 5.6 10*3/uL (ref 4.0–10.5)
nRBC: 0 % (ref 0.0–0.2)

## 2019-08-27 MED ORDER — ACETAMINOPHEN 325 MG PO TABS
650.0000 mg | ORAL_TABLET | Freq: Once | ORAL | Status: AC
  Administered 2019-08-27: 650 mg via ORAL

## 2019-08-27 MED ORDER — SODIUM CHLORIDE 0.9% FLUSH
10.0000 mL | Freq: Once | INTRAVENOUS | Status: AC
  Administered 2019-08-27: 10 mL
  Filled 2019-08-27: qty 10

## 2019-08-27 MED ORDER — HEPARIN SOD (PORK) LOCK FLUSH 100 UNIT/ML IV SOLN
500.0000 [IU] | Freq: Once | INTRAVENOUS | Status: AC | PRN
  Administered 2019-08-27: 500 [IU]
  Filled 2019-08-27: qty 5

## 2019-08-27 MED ORDER — TRASTUZUMAB-DKST CHEMO 150 MG IV SOLR
150.0000 mg | Freq: Once | INTRAVENOUS | Status: AC
  Administered 2019-08-27: 150 mg via INTRAVENOUS
  Filled 2019-08-27: qty 7.14

## 2019-08-27 MED ORDER — ACETAMINOPHEN 325 MG PO TABS
ORAL_TABLET | ORAL | Status: AC
  Filled 2019-08-27: qty 2

## 2019-08-27 MED ORDER — SODIUM CHLORIDE 0.9% FLUSH
10.0000 mL | INTRAVENOUS | Status: DC | PRN
  Administered 2019-08-27: 10 mL
  Filled 2019-08-27: qty 10

## 2019-08-27 MED ORDER — PROCHLORPERAZINE EDISYLATE 10 MG/2ML IJ SOLN
INTRAMUSCULAR | Status: AC
  Filled 2019-08-27: qty 2

## 2019-08-27 MED ORDER — SODIUM CHLORIDE 0.9 % IV SOLN
Freq: Once | INTRAVENOUS | Status: AC
  Filled 2019-08-27: qty 250

## 2019-08-27 MED ORDER — PACLITAXEL PROTEIN-BOUND CHEMO INJECTION 100 MG
100.0000 mg/m2 | Freq: Once | INTRAVENOUS | Status: AC
  Administered 2019-08-27: 175 mg via INTRAVENOUS
  Filled 2019-08-27: qty 35

## 2019-08-27 MED ORDER — PROCHLORPERAZINE EDISYLATE 10 MG/2ML IJ SOLN
10.0000 mg | Freq: Once | INTRAMUSCULAR | Status: AC
  Administered 2019-08-27: 10 mg via INTRAVENOUS

## 2019-08-27 MED ORDER — FAMOTIDINE IN NACL 20-0.9 MG/50ML-% IV SOLN
20.0000 mg | Freq: Once | INTRAVENOUS | Status: AC
  Administered 2019-08-27: 20 mg via INTRAVENOUS

## 2019-08-27 MED ORDER — DIPHENHYDRAMINE HCL 50 MG/ML IJ SOLN
25.0000 mg | Freq: Once | INTRAMUSCULAR | Status: AC
  Administered 2019-08-27: 25 mg via INTRAVENOUS

## 2019-08-27 MED ORDER — FAMOTIDINE IN NACL 20-0.9 MG/50ML-% IV SOLN
INTRAVENOUS | Status: AC
  Filled 2019-08-27: qty 50

## 2019-08-27 MED ORDER — DIPHENHYDRAMINE HCL 50 MG/ML IJ SOLN
INTRAMUSCULAR | Status: AC
  Filled 2019-08-27: qty 1

## 2019-08-27 NOTE — Patient Instructions (Signed)
[facility] Discharge Instructions for Patients Receiving Chemotherapy  Today you received the following chemotherapy agents: trastuzumab and protein-bound paclitaxel.  To help prevent nausea and vomiting after your treatment, we encourage you to take your nausea medication as directed.   If you develop nausea and vomiting that is not controlled by your nausea medication, call the clinic.   BELOW ARE SYMPTOMS THAT SHOULD BE REPORTED IMMEDIATELY:  *FEVER GREATER THAN 100.5 F  *CHILLS WITH OR WITHOUT FEVER  NAUSEA AND VOMITING THAT IS NOT CONTROLLED WITH YOUR NAUSEA MEDICATION  *UNUSUAL SHORTNESS OF BREATH  *UNUSUAL BRUISING OR BLEEDING  TENDERNESS IN MOUTH AND THROAT WITH OR WITHOUT PRESENCE OF ULCERS  *URINARY PROBLEMS  *BOWEL PROBLEMS  UNUSUAL RASH Items with * indicate a potential emergency and should be followed up as soon as possible.  Feel free to call the clinic should you have any questions or concerns. The clinic phone number is [phone].  Please show the [facility] at check-in to the Emergency Department and triage nurse.

## 2019-09-02 NOTE — Telephone Encounter (Signed)
No entry 

## 2019-09-03 ENCOUNTER — Other Ambulatory Visit: Payer: Self-pay

## 2019-09-03 ENCOUNTER — Inpatient Hospital Stay: Payer: Medicare Other

## 2019-09-03 ENCOUNTER — Inpatient Hospital Stay (HOSPITAL_BASED_OUTPATIENT_CLINIC_OR_DEPARTMENT_OTHER): Payer: Medicare Other | Admitting: Oncology

## 2019-09-03 VITALS — BP 143/86 | HR 65 | Temp 98.2°F | Resp 18 | Ht 61.0 in | Wt 161.1 lb

## 2019-09-03 DIAGNOSIS — N950 Postmenopausal bleeding: Secondary | ICD-10-CM | POA: Diagnosis not present

## 2019-09-03 DIAGNOSIS — Z5112 Encounter for antineoplastic immunotherapy: Secondary | ICD-10-CM | POA: Diagnosis not present

## 2019-09-03 DIAGNOSIS — Z171 Estrogen receptor negative status [ER-]: Secondary | ICD-10-CM

## 2019-09-03 DIAGNOSIS — C50411 Malignant neoplasm of upper-outer quadrant of right female breast: Secondary | ICD-10-CM | POA: Diagnosis not present

## 2019-09-03 DIAGNOSIS — E782 Mixed hyperlipidemia: Secondary | ICD-10-CM

## 2019-09-03 DIAGNOSIS — H401333 Pigmentary glaucoma, bilateral, severe stage: Secondary | ICD-10-CM

## 2019-09-03 DIAGNOSIS — D126 Benign neoplasm of colon, unspecified: Secondary | ICD-10-CM

## 2019-09-03 DIAGNOSIS — Z95828 Presence of other vascular implants and grafts: Secondary | ICD-10-CM

## 2019-09-03 LAB — CBC WITH DIFFERENTIAL (CANCER CENTER ONLY)
Abs Immature Granulocytes: 0.08 10*3/uL — ABNORMAL HIGH (ref 0.00–0.07)
Basophils Absolute: 0.1 10*3/uL (ref 0.0–0.1)
Basophils Relative: 1 %
Eosinophils Absolute: 0.4 10*3/uL (ref 0.0–0.5)
Eosinophils Relative: 6 %
HCT: 36.8 % (ref 36.0–46.0)
Hemoglobin: 12 g/dL (ref 12.0–15.0)
Immature Granulocytes: 1 %
Lymphocytes Relative: 34 %
Lymphs Abs: 2.4 10*3/uL (ref 0.7–4.0)
MCH: 27.9 pg (ref 26.0–34.0)
MCHC: 32.6 g/dL (ref 30.0–36.0)
MCV: 85.6 fL (ref 80.0–100.0)
Monocytes Absolute: 0.5 10*3/uL (ref 0.1–1.0)
Monocytes Relative: 7 %
Neutro Abs: 3.6 10*3/uL (ref 1.7–7.7)
Neutrophils Relative %: 51 %
Platelet Count: 371 10*3/uL (ref 150–400)
RBC: 4.3 MIL/uL (ref 3.87–5.11)
RDW: 16.1 % — ABNORMAL HIGH (ref 11.5–15.5)
WBC Count: 7.1 10*3/uL (ref 4.0–10.5)
nRBC: 0.3 % — ABNORMAL HIGH (ref 0.0–0.2)

## 2019-09-03 LAB — CMP (CANCER CENTER ONLY)
ALT: 36 U/L (ref 0–44)
AST: 19 U/L (ref 15–41)
Albumin: 4.2 g/dL (ref 3.5–5.0)
Alkaline Phosphatase: 79 U/L (ref 38–126)
Anion gap: 10 (ref 5–15)
BUN: 14 mg/dL (ref 8–23)
CO2: 25 mmol/L (ref 22–32)
Calcium: 9 mg/dL (ref 8.9–10.3)
Chloride: 106 mmol/L (ref 98–111)
Creatinine: 0.78 mg/dL (ref 0.44–1.00)
GFR, Est AFR Am: >60 mL/min (ref >60)
GFR, Estimated: >60 mL/min (ref >60)
Glucose, Bld: 139 mg/dL — ABNORMAL HIGH (ref 70–99)
Potassium: 3.6 mmol/L (ref 3.5–5.1)
Sodium: 141 mmol/L (ref 135–145)
Total Bilirubin: 0.3 mg/dL (ref 0.3–1.2)
Total Protein: 7 g/dL (ref 6.5–8.1)

## 2019-09-03 MED ORDER — DIPHENHYDRAMINE HCL 50 MG/ML IJ SOLN
25.0000 mg | Freq: Once | INTRAMUSCULAR | Status: AC
  Administered 2019-09-03: 10:00:00 25 mg via INTRAVENOUS

## 2019-09-03 MED ORDER — PACLITAXEL PROTEIN-BOUND CHEMO INJECTION 100 MG
100.0000 mg/m2 | Freq: Once | INTRAVENOUS | Status: AC
  Administered 2019-09-03: 175 mg via INTRAVENOUS
  Filled 2019-09-03: qty 35

## 2019-09-03 MED ORDER — HEPARIN SOD (PORK) LOCK FLUSH 100 UNIT/ML IV SOLN
500.0000 [IU] | Freq: Once | INTRAVENOUS | Status: AC | PRN
  Administered 2019-09-03: 500 [IU]
  Filled 2019-09-03: qty 5

## 2019-09-03 MED ORDER — FAMOTIDINE IN NACL 20-0.9 MG/50ML-% IV SOLN
INTRAVENOUS | Status: AC
  Filled 2019-09-03: qty 50

## 2019-09-03 MED ORDER — ACETAMINOPHEN 325 MG PO TABS
650.0000 mg | ORAL_TABLET | Freq: Once | ORAL | Status: AC
  Administered 2019-09-03: 650 mg via ORAL

## 2019-09-03 MED ORDER — SODIUM CHLORIDE 0.9% FLUSH
10.0000 mL | INTRAVENOUS | Status: DC | PRN
  Administered 2019-09-03: 10 mL
  Filled 2019-09-03: qty 10

## 2019-09-03 MED ORDER — FAMOTIDINE IN NACL 20-0.9 MG/50ML-% IV SOLN
20.0000 mg | Freq: Once | INTRAVENOUS | Status: AC
  Administered 2019-09-03: 20 mg via INTRAVENOUS

## 2019-09-03 MED ORDER — SODIUM CHLORIDE 0.9% FLUSH
10.0000 mL | Freq: Once | INTRAVENOUS | Status: AC
  Administered 2019-09-03: 10 mL
  Filled 2019-09-03: qty 10

## 2019-09-03 MED ORDER — PROCHLORPERAZINE EDISYLATE 10 MG/2ML IJ SOLN
INTRAMUSCULAR | Status: AC
  Filled 2019-09-03: qty 2

## 2019-09-03 MED ORDER — DIPHENHYDRAMINE HCL 50 MG/ML IJ SOLN
INTRAMUSCULAR | Status: AC
  Filled 2019-09-03: qty 1

## 2019-09-03 MED ORDER — SODIUM CHLORIDE 0.9 % IV SOLN
Freq: Once | INTRAVENOUS | Status: AC
  Filled 2019-09-03: qty 250

## 2019-09-03 MED ORDER — ACETAMINOPHEN 325 MG PO TABS
ORAL_TABLET | ORAL | Status: AC
  Filled 2019-09-03: qty 2

## 2019-09-03 MED ORDER — PROCHLORPERAZINE EDISYLATE 10 MG/2ML IJ SOLN
10.0000 mg | Freq: Once | INTRAMUSCULAR | Status: AC
  Administered 2019-09-03: 10 mg via INTRAVENOUS

## 2019-09-03 MED ORDER — TRASTUZUMAB-DKST CHEMO 150 MG IV SOLR
150.0000 mg | Freq: Once | INTRAVENOUS | Status: AC
  Administered 2019-09-03: 150 mg via INTRAVENOUS
  Filled 2019-09-03: qty 7.14

## 2019-09-03 NOTE — Addendum Note (Signed)
Addended by: Chauncey Cruel on: 09/03/2019 09:29 AM   Modules accepted: Orders

## 2019-09-03 NOTE — Progress Notes (Signed)
[facility]  [phone] [phone]    ID: Megan Khan DOB: [birth year]  MR#: [id number]  JOI#:[id number]  Patient Care Team: [name], DO as PCP - General [name], [name], MD as PCP - Advanced Heart Failure (Cardiology) [name], [name], MD as Consulting Physician (Oncology) Fanny Skates, MD as Consulting Physician (General Surgery) [name], MD as Consulting Physician (Radiation Oncology) Wallene Huh, DPM as Consulting Physician (Podiatry) [name], MD as Consulting Physician (Ophthalmology) [name], MD as Consulting Physician (Gastroenterology) [name], RN as Oncology Nurse Navigator [name], RN as Oncology Nurse Navigator OTHER MD:   CHIEF COMPLAINT: Estrogen receptor negative breast cancer  CURRENT TREATMENT: Abraxane/Trastuzumab   INTERVAL HISTORY: Arwilda is here for follow up of her HER-2 positive breast cancer.  She is being treated with adjuvant Abraxane and Trastuzumab (ogivri which is biosimilar), first dose on 07/10/2019. She receives this weekly, with 12 weeks plan, and then will continue with trastuzumab every 3 weeks to complete1 year of therapy.    Today is day 1 cycle 9 of her chemoimmunotherapy.  Importantly she has had absolutely no peripheral neuropathy.  Her most recent echocardiogram was on 07/22/2019 and showed an EF of 65-70%.  Her next echocardiogram is already scheduled for October 21, 2019   REVIEW OF SYSTEMS: [name] tells me she has had diarrhea problems since starting the chemotherapy.  It is a little bit worse now.  After 2 or 3 loose bowel movements she will take an Imodium and then she will be fine for 2 or 3 days and then she will have a watery diarrhea again.  She has not been exposed to antibiotics that she knows of for the last several months.  Aside from that she has mild fatigue.  She has not been able to go to church.  She is not doing much except sitting  around the house.  A detailed review of systems today was otherwise stable.  HISTORY OF CURRENT ILLNESS: From the original intake note:  Megan Khan had routine screening mammography on 02/24/2019 showing a possible abnormality in the right breast. She underwent unilateral right diagnostic mammography with tomography and right breast ultrasonography at [facility] on 03/04/2019 showing: Breast Density Category B. Additional imaging of the right breast was performed. There is a persistent asymmetry and distortion in the middle third of the upper-outer quadrant of the breast. There are no malignant type microcalcifications. On physical exam, I do not palpate a mass in the upper-outer quadrant of the right breast. Targeted ultrasound is performed, showing an irregular hypoechoic mass in the right breast 11 o'clock 7 cm from the nipple measuring 0.7 x 0.7 x 1.0 cm. Sonographic evaluation of the right axilla does not show any enlarged adenopathy.  Accordingly on 03/11/2019 she proceeded to biopsy of the right breast area in question. The pathology from this procedure showed ([id number]): invasive ductal carcinoma, grade II, 11 o'clock 7 cm from the nipple. Prognostic indicators significant for: estrogen receptor, 0% negative and progesterone receptor, 0% negative. Proliferation marker Ki67 at 10%. HER2 positive (3+) by immunohistochemistry.  The patient's subsequent history is as detailed below.    PAST MEDICAL HISTORY: Past Medical History:  Diagnosis Date  . Anemia    during pregnancy  . Cancer [facility]) 2020   breast cancer on right  . Diabetes mellitus    Type 2  . Family history of breast cancer   . Family history of throat cancer   .  Glaucoma   . Glaucoma   . Hx of colonic polyps   . Hyperlipidemia   . Hypertension   . Legally blind    completely blind in left eye, low vision in right    PAST SURGICAL HISTORY: Past Surgical History:  Procedure Laterality Date  . BREAST  LUMPECTOMY WITH RADIOACTIVE SEED AND SENTINEL LYMPH NODE BIOPSY Right 05/29/2019   Procedure: RIGHT BREAST LUMPECTOMY WITH RADIOACTIVE SEED AND RIGHT DEEP AXILLARY SENTINEL LYMPH NODE BIOPSY INJECT BLUE DYE;  Surgeon: Fanny Skates, MD;  Location: [facility];  Service: General;  Laterality: Right;  . COLONOSCOPY  01/22/2012   Procedure: COLONOSCOPY;  Surgeon: [name], MD;  Location: [facility];  Service: Endoscopy;  Laterality: N/A;  . COLONOSCOPY    . COLONOSCOPY    . PORTACATH PLACEMENT N/A 05/29/2019   Procedure: INSERTION PORT-A-CATH WITH ULTRASOUND;  Surgeon: Fanny Skates, MD;  Location: [facility];  Service: General;  Laterality: N/A;  . RE-EXCISION OF BREAST LUMPECTOMY Right 06/10/2019   Procedure: RE-EXCISION OF RIGHT BREAST LUMPECTOMY MARGINS;  Surgeon: Fanny Skates, MD;  Location: [facility];  Service: General;  Laterality: Right;  . TUBAL LIGATION       FAMILY HISTORY: Family History  Problem Relation Age of Onset  . Breast cancer Sister 71       Her2 +  . Breast cancer Cousin        maternal second cousin, Bilateral mastectomy, diagnosed in her 66s   Megan Khan's father died from natural causes at age 53. Patients' mother died from natural causes at age 68. The patient has 5 brothers and 3 sisters.  The patient's sister, [name], is my patient diagnosed with breast cancer at 42.  The cyst cancer was estrogen receptor positive, HER-2 negative.  She received no chemo.  [name]'s maternal 2nd cousin was diagnosed with breast cancer. Patient denies anyone in her family having ovarian, prostate, or pancreatic cancer.    GYNECOLOGIC HISTORY:  No LMP recorded. Patient is postmenopausal. Menarche: 64 years old Age at first live birth: 64 years old GX P: 3 LMP: 34 Contraceptive: no HRT: no  Hysterectomy?: no BSO?: no   SOCIAL HISTORY: (Current as of 03/30/2019) Dominick is disabled secondary to her glaucoma.she is legally blind.  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 She is divorced. She lives with her son, Megan Khan who is 58 and is disabled with cerebral palsy.  He does attend school. Her son Megan Khan was killed (gunshot wound) age 42.Marland Kitchen Her son, [name], is 56 and works for the [employer]. Her daughter, [name], is 90 and is a [employer]ar[employer] manager. Keyah has 7 grandchildren and 2 great-grandchildren.  She attends the [employer]: Not in place. She has verbally named her sister, [name], as her healthcare power of attorney.  At the 03/30/2019 visit she was given the appropriate documents to complete and notarize at her discretion  HEALTH MAINTENANCE: Social History   Tobacco Use  . Smoking status: Former Smoker    Types: Cigarettes    Quit date: 09/23/1995    Years since quitting: 23.9  . Smokeless tobacco: Never Used  Substance Use Topics  . Alcohol use: No  . Drug use: No    Colonoscopy: June 2013/again in  PAP: Per Dr. Criss Rosales  Bone density: Never  Allergies  Allergen Reactions  . Sulfamethoxazole Rash    REACTION: Questionable allergy ?    Current  Outpatient Medications  Medication Sig Dispense Refill  . amLODipine (NORVASC) 10 MG tablet TAKE 1 TABLET(10 MG) BY MOUTH DAILY 90 tablet 3  . bimatoprost (LUMIGAN) 0.01 % SOLN Place 2 drops into the left eye at bedtime.     . blood glucose meter kit and supplies KIT Dispense based on patient and insurance preference. Use up to four times daily as directed. (FOR ICD-9 250.00, 250.01). 1 each 12  . dorzolamide (TRUSOPT) 2 % ophthalmic solution Place 2 drops into the left eye 3 (three) times daily.     Marland Kitchen gabapentin (NEURONTIN) 100 MG capsule TAKE 2 CAPSULES(200 MG) BY MOUTH THREE TIMES DAILY 180 capsule 0  . lidocaine-prilocaine (EMLA) cream Apply to affected area once 30 g 3  . lisinopril-hydrochlorothiazide (ZESTORETIC) 20-25 MG tablet Take 1 tablet by mouth daily. 90 tablet 3  . lovastatin (MEVACOR) 20 MG tablet TAKE 1 TABLET BY MOUTH EVERY NIGHT AT  BEDTIME 90 tablet 3  . magic mouthwash SOLN Take 5 mLs by mouth.    . metFORMIN (GLUCOPHAGE) 1000 MG tablet Take 1 tablet (1,000 mg total) by mouth 2 (two) times daily with a meal. 360 tablet 1  . metoprolol succinate (TOPROL-XL) 50 MG 24 hr tablet TAKE 1 TABLET(50 MG) BY MOUTH DAILY 90 tablet 0  . prochlorperazine (COMPAZINE) 10 MG tablet TAKE 1 TABLET(10 MG) BY MOUTH EVERY 6 HOURS AS NEEDED FOR NAUSEA OR VOMITING 30 tablet 1   No current facility-administered medications for this visit.     OBJECTIVE: Middle-aged African-American woman in no acute distress Vitals:   09/03/19 0835  BP: (!) 143/86  Pulse: 65  Resp: 18  Temp: 98.2 F (36.8 C)  SpO2: 99%     Body mass index is 30.44 kg/m.   Wt Readings from Last 3 Encounters:  09/03/19 161 lb 1.6 oz (73.1 kg)  08/20/19 161 lb 11.2 oz (73.3 kg)  08/06/19 160 lb 9.6 oz (72.8 kg)  ECOG FS:1 - Symptomatic but completely ambulatory   Sclerae unicteric, EOMs intact Wearing a mask No cervical or supraclavicular adenopathy Lungs no rales or rhonchi Heart regular rate and rhythm Abd soft, nontender, positive bowel sounds MSK no focal spinal tenderness, no upper extremity lymphedema Neuro: nonfocal, well oriented, appropriate affect Breasts: The right breast is status post lumpectomy.  There is mild distortion of the breast contour.  There is no evidence of local recurrence.  The left breast is benign.  Both axillae are benign  LAB RESULTS:  CMP     Component Value Date/Time   NA 140 08/27/2019 0815   NA 142 05/27/2018 1533   K 3.9 08/27/2019 0815   CL 104 08/27/2019 0815   CO2 25 08/27/2019 0815   GLUCOSE 140 (H) 08/27/2019 0815   BUN 11 08/27/2019 0815   BUN 19 05/27/2018 1533   CREATININE 0.85 08/27/2019 0815   CREATININE 0.79 01/30/2016 1130   CALCIUM 9.2 08/27/2019 0815   PROT 7.0 08/27/2019 0815   ALBUMIN 4.2 08/27/2019 0815   AST 16 08/27/2019 0815   ALT 30 08/27/2019 0815   ALKPHOS 81 08/27/2019 0815   BILITOT  0.3 08/27/2019 0815   GFRNONAA >60 08/27/2019 0815   GFRNONAA 82 01/30/2016 1130   GFRAA >60 08/27/2019 0815   GFRAA >89 01/30/2016 1130    No results found for: TOTALPROTELP, ALBUMINELP, A1GS, A2GS, BETS, BETA2SER, GAMS, MSPIKE, SPEI  No results found for: KPAFRELGTCHN, LAMBDASER, KAPLAMBRATIO  Lab Results  Component Value Date   WBC 7.1 09/03/2019  NEUTROABS 3.6 09/03/2019   HGB 12.0 09/03/2019   HCT 36.8 09/03/2019   MCV 85.6 09/03/2019   PLT 371 09/03/2019    No results found for: LABCA2  No components found for: [id number]  No results for input(s): INR in the last 168 hours.  No results found for: LABCA2  No results found for: EUM353  No results found for: IRW431  No results found for: VQM086  No results found for: CA2729  No components found for: HGQUANT  No results found for: CEA1 / No results found for: CEA1   No results found for: AFPTUMOR  No results found for: CHROMOGRNA  No results found for: PSA1  Appointment on 09/03/2019  Component Date Value Ref Range Status  . WBC Count 09/03/2019 7.1  4.0 - 10.5 K/uL Final  . RBC 09/03/2019 4.30  3.87 - 5.11 MIL/uL Final  . Hemoglobin 09/03/2019 12.0  12.0 - 15.0 g/dL Final  . HCT 09/03/2019 36.8  36.0 - 46.0 % Final  . MCV 09/03/2019 85.6  80.0 - 100.0 fL Final  . MCH 09/03/2019 27.9  26.0 - 34.0 pg Final  . MCHC 09/03/2019 32.6  30.0 - 36.0 g/dL Final  . RDW 09/03/2019 16.1* 11.5 - 15.5 % Final  . Platelet Count 09/03/2019 371  150 - 400 K/uL Final  . nRBC 09/03/2019 0.3* 0.0 - 0.2 % Final  . Neutrophils Relative % 09/03/2019 51  % Final  . Neutro Abs 09/03/2019 3.6  1.7 - 7.7 K/uL Final  . Lymphocytes Relative 09/03/2019 34  % Final  . Lymphs Abs 09/03/2019 2.4  0.7 - 4.0 K/uL Final  . Monocytes Relative 09/03/2019 7  % Final  . Monocytes Absolute 09/03/2019 0.5  0.1 - 1.0 K/uL Final  . Eosinophils Relative 09/03/2019 6  % Final  . Eosinophils Absolute 09/03/2019 0.4  0.0 - 0.5 K/uL Final  .  Basophils Relative 09/03/2019 1  % Final  . Basophils Absolute 09/03/2019 0.1  0.0 - 0.1 K/uL Final  . Immature Granulocytes 09/03/2019 1  % Final  . Abs Immature Granulocytes 09/03/2019 0.08* 0.00 - 0.07 K/uL Final   Performed at [facility], Dimitris Shanahan [address] [place], [place] [address]    (this displays the last labs from the last 3 days)  No results found for: TOTALPROTELP, ALBUMINELP, A1GS, A2GS, BETS, BETA2SER, GAMS, MSPIKE, SPEI (this displays SPEP labs)  No results found for: KPAFRELGTCHN, LAMBDASER, KAPLAMBRATIO (kappa/lambda light chains)  No results found for: HGBA, HGBA2QUANT, HGBFQUANT, HGBSQUAN (Hemoglobinopathy evaluation)   No results found for: LDH  No results found for: IRON, TIBC, IRONPCTSAT (Iron and TIBC)  No results found for: FERRITIN  Urinalysis No results found for: COLORURINE, APPEARANCEUR, LABSPEC, PHURINE, GLUCOSEU, HGBUR, BILIRUBINUR, KETONESUR, PROTEINUR, UROBILINOGEN, NITRITE, LEUKOCYTESUR   STUDIES:  No results found.   ELIGIBLE FOR AVAILABLE RESEARCH PROTOCOL: UPBEAT  ASSESSMENT: 64 y.o. [place], [place] woman status post right breast upper outer quadrant biopsy 03/11/2019 for a clinical t1b N0, stage IA invasive ductal carcinoma, grade 2, estrogen and progesterone receptor negative, HER-2 amplified, with an MIB-1 of 10%.  (1) genetics testing 03/30/2019: declined by patient  (2) Right breast lumpectomy on 05/29/2019: pT1b pN0, stage IA invasive ductal carciinoma, grade 2,   (a) a total of 5 lymph nodes were negative for carcinoma  (b) superior margin positive, cleared with addition surgery on 06/10/2019   (c) surgery delayed due to abnormal EKG, patient cleared by Dr. Gwenlyn Found on 04/21/2019  (3) adjuvant chemotherapy to consist  of Abraxane weekly x12 with Trastuzumab, starting 07/10/2019  (a) Abraxane given due to diabetes, and inability to tolerate steroid premedication required with Paclitaxel.  (4) trastuzumab to be  continued to complete 1 year  (a) baseline echo 04/06/2019 shows an ejection fraction greater than 65%  (b) echo on 07/22/2019 shows EF 65-70%  (c) echo 10/21/2019  (5) adjuvant radiation to follow.  PLAN: Bassheva is tolerating her chemotherapy remarkably well.  We are proceeding with treatment #9 today.  She has 3 more weeks to go.  Importantly she has had no peripheral neuropathy problems.  I do not believe the diarrhea is going to be related to the chemotherapy.  We get constipation much more commonly.  At the same time she has not had any antibiotics anytime recently so it would be unlikely that she would have C. difficile.  Nevertheless I have asked her to provide [place] with a liquid stool sample and she will take the container home with her and bring it next time  Once she is done with chemotherapy of course she will continue the Herceptin and proceed to radiation  She knows to call for any other issue that may develop before the next visit.  Total encounter time 25 minutes.Wilber Bihari, NP  09/03/19 9:02 AM Medical Oncology and Hematology [facility] [place], [place] [address] Tel. [phone]    Fax. [phone]   *Total Encounter Time as defined by the Centers for Medicare and Medicaid Services includes, in addition to the face-to-face time of a patient visit (documented in the note above) non-face-to-face time: obtaining and reviewing outside history, ordering and reviewing medications, tests or procedures, care coordination (communications with other health care professionals or caregivers) and documentation in the medical record.

## 2019-09-03 NOTE — Patient Instructions (Signed)
[facility] Discharge Instructions for Patients Receiving Chemotherapy  Today you received the following chemotherapy agents Paclitaxel-protein (ABRAXANE) & Trastuzumab (OGIVRI).  To help prevent nausea and vomiting after your treatment, we encourage you to take your nausea medication as prescribed.   If you develop nausea and vomiting that is not controlled by your nausea medication, call the clinic.   BELOW ARE SYMPTOMS THAT SHOULD BE REPORTED IMMEDIATELY:  *FEVER GREATER THAN 100.5 F  *CHILLS WITH OR WITHOUT FEVER  NAUSEA AND VOMITING THAT IS NOT CONTROLLED WITH YOUR NAUSEA MEDICATION  *UNUSUAL SHORTNESS OF BREATH  *UNUSUAL BRUISING OR BLEEDING  TENDERNESS IN MOUTH AND THROAT WITH OR WITHOUT PRESENCE OF ULCERS  *URINARY PROBLEMS  *BOWEL PROBLEMS  UNUSUAL RASH Items with * indicate a potential emergency and should be followed up as soon as possible.  Feel free to call the clinic should you have any questions or concerns. The clinic phone number is [phone].  Please show the [facility] at check-in to the Emergency Department and triage nurse.  Coronavirus (COVID-19) Are you at risk?  Are you at risk for the Coronavirus (COVID-19)?  To be considered HIGH RISK for Coronavirus (COVID-19), you have to meet the following criteria:  . Traveled to [place], [place], [place], [place] or [place]; or in the [place] to [place], [place], [place], or [place]; and have fever, cough, and shortness of breath within the last 2 weeks of travel OR . Been in close contact with a person diagnosed with COVID-19 within the last 2 weeks and have fever, cough, and shortness of breath . IF YOU DO NOT MEET THESE CRITERIA, YOU ARE CONSIDERED LOW RISK FOR COVID-19.  What to do if you are HIGH RISK for COVID-19?  Marland Kitchen If you are having a medical emergency, call 911. . Seek medical care right away. Before you go to a doctor's office, urgent care or emergency  department, call ahead and tell them about your recent travel, contact with someone diagnosed with COVID-19, and your symptoms. You should receive instructions from your physician's office regarding next steps of care.  . When you arrive at healthcare provider, tell the healthcare staff immediately you have returned from visiting [place], [place], [place], [place] or [place]; or traveled in the [place] to [place], [place], [place], or [place]; in the last two weeks or you have been in close contact with a person diagnosed with COVID-19 in the last 2 weeks.   . Tell the health care staff about your symptoms: fever, cough and shortness of breath. . After you have been seen by a medical provider, you will be either: o Tested for (COVID-19) and discharged home on quarantine except to seek medical care if symptoms worsen, and asked to  - Stay home and avoid contact with others until you get your results (4-5 days)  - Avoid travel on public transportation if possible (such as bus, train, or airplane) or o Sent to the Emergency Department by EMS for evaluation, COVID-19 testing, and possible admission depending on your condition and test results.  What to do if you are LOW RISK for COVID-19?  Reduce your risk of any infection by using the same precautions used for avoiding the common cold or flu:  Marland Kitchen Wash your hands often with soap and warm water for at least 20 seconds.  If soap and water are not readily available, use an alcohol-based hand sanitizer with at least 60% alcohol.  Marland Kitchen  If coughing or sneezing, cover your mouth and nose by coughing or sneezing into the elbow areas of your shirt or coat, into a tissue or into your sleeve (not your hands). . Avoid shaking hands with others and consider head nods or verbal greetings only. . Avoid touching your eyes, nose, or mouth with unwashed hands.  . Avoid close contact with people who are sick. . Avoid places or events with large numbers of people  in one location, like concerts or sporting events. . Carefully consider travel plans you have or are making. . If you are planning any travel outside or inside the [place], visit the CDC's Travelers' Health webpage for the latest health notices. . If you have some symptoms but not all symptoms, continue to monitor at home and seek medical attention if your symptoms worsen. . If you are having a medical emergency, call 911.   ADDITIONAL HEALTHCARE OPTIONS FOR PATIENTS  [facility] / e-Visit: [email or web address]         [facility]: [phone]  [facility]: [phone]                   [facility]: [phone]   

## 2019-09-09 ENCOUNTER — Encounter: Payer: Self-pay | Admitting: *Deleted

## 2019-09-10 ENCOUNTER — Encounter: Payer: Self-pay | Admitting: Adult Health

## 2019-09-10 ENCOUNTER — Inpatient Hospital Stay: Payer: Medicare Other

## 2019-09-10 ENCOUNTER — Inpatient Hospital Stay (HOSPITAL_BASED_OUTPATIENT_CLINIC_OR_DEPARTMENT_OTHER): Payer: Medicare Other | Admitting: Adult Health

## 2019-09-10 ENCOUNTER — Other Ambulatory Visit: Payer: Self-pay

## 2019-09-10 VITALS — BP 133/85 | HR 68 | Temp 98.3°F | Resp 17 | Ht 61.0 in | Wt 161.4 lb

## 2019-09-10 DIAGNOSIS — Z171 Estrogen receptor negative status [ER-]: Secondary | ICD-10-CM | POA: Diagnosis not present

## 2019-09-10 DIAGNOSIS — C50411 Malignant neoplasm of upper-outer quadrant of right female breast: Secondary | ICD-10-CM | POA: Diagnosis not present

## 2019-09-10 DIAGNOSIS — Z5112 Encounter for antineoplastic immunotherapy: Secondary | ICD-10-CM | POA: Diagnosis not present

## 2019-09-10 LAB — CMP (CANCER CENTER ONLY)
ALT: 39 U/L (ref 0–44)
AST: 18 U/L (ref 15–41)
Albumin: 4.1 g/dL (ref 3.5–5.0)
Alkaline Phosphatase: 79 U/L (ref 38–126)
Anion gap: 9 (ref 5–15)
BUN: 12 mg/dL (ref 8–23)
CO2: 26 mmol/L (ref 22–32)
Calcium: 9.1 mg/dL (ref 8.9–10.3)
Chloride: 103 mmol/L (ref 98–111)
Creatinine: 0.83 mg/dL (ref 0.44–1.00)
GFR, Est AFR Am: >60 mL/min (ref >60)
GFR, Estimated: >60 mL/min (ref >60)
Glucose, Bld: 201 mg/dL — ABNORMAL HIGH (ref 70–99)
Potassium: 3.5 mmol/L (ref 3.5–5.1)
Sodium: 138 mmol/L (ref 135–145)
Total Bilirubin: 0.3 mg/dL (ref 0.3–1.2)
Total Protein: 6.8 g/dL (ref 6.5–8.1)

## 2019-09-10 LAB — CBC WITH DIFFERENTIAL (CANCER CENTER ONLY)
Abs Immature Granulocytes: 0.04 10*3/uL (ref 0.00–0.07)
Basophils Absolute: 0.1 10*3/uL (ref 0.0–0.1)
Basophils Relative: 2 %
Eosinophils Absolute: 0.4 10*3/uL (ref 0.0–0.5)
Eosinophils Relative: 7 %
HCT: 36.2 % (ref 36.0–46.0)
Hemoglobin: 11.7 g/dL — ABNORMAL LOW (ref 12.0–15.0)
Immature Granulocytes: 1 %
Lymphocytes Relative: 39 %
Lymphs Abs: 2.4 10*3/uL (ref 0.7–4.0)
MCH: 27.6 pg (ref 26.0–34.0)
MCHC: 32.3 g/dL (ref 30.0–36.0)
MCV: 85.4 fL (ref 80.0–100.0)
Monocytes Absolute: 0.5 10*3/uL (ref 0.1–1.0)
Monocytes Relative: 8 %
Neutro Abs: 2.7 10*3/uL (ref 1.7–7.7)
Neutrophils Relative %: 43 %
Platelet Count: 329 10*3/uL (ref 150–400)
RBC: 4.24 MIL/uL (ref 3.87–5.11)
RDW: 16.2 % — ABNORMAL HIGH (ref 11.5–15.5)
WBC Count: 6.1 10*3/uL (ref 4.0–10.5)
nRBC: 0 % (ref 0.0–0.2)

## 2019-09-10 MED ORDER — HEPARIN SOD (PORK) LOCK FLUSH 100 UNIT/ML IV SOLN
500.0000 [IU] | Freq: Once | INTRAVENOUS | Status: AC | PRN
  Administered 2019-09-10: 500 [IU]
  Filled 2019-09-10: qty 5

## 2019-09-10 MED ORDER — FAMOTIDINE IN NACL 20-0.9 MG/50ML-% IV SOLN
20.0000 mg | Freq: Once | INTRAVENOUS | Status: AC
  Administered 2019-09-10: 20 mg via INTRAVENOUS

## 2019-09-10 MED ORDER — SODIUM CHLORIDE 0.9% FLUSH
10.0000 mL | INTRAVENOUS | Status: DC | PRN
  Administered 2019-09-10: 10 mL
  Filled 2019-09-10: qty 10

## 2019-09-10 MED ORDER — DIPHENHYDRAMINE HCL 50 MG/ML IJ SOLN
INTRAMUSCULAR | Status: AC
  Filled 2019-09-10: qty 1

## 2019-09-10 MED ORDER — SODIUM CHLORIDE 0.9 % IV SOLN
Freq: Once | INTRAVENOUS | Status: AC
  Filled 2019-09-10: qty 250

## 2019-09-10 MED ORDER — PROCHLORPERAZINE EDISYLATE 10 MG/2ML IJ SOLN
10.0000 mg | Freq: Once | INTRAMUSCULAR | Status: AC
  Administered 2019-09-10: 10 mg via INTRAVENOUS

## 2019-09-10 MED ORDER — PACLITAXEL PROTEIN-BOUND CHEMO INJECTION 100 MG
100.0000 mg/m2 | Freq: Once | INTRAVENOUS | Status: AC
  Administered 2019-09-10: 175 mg via INTRAVENOUS
  Filled 2019-09-10: qty 35

## 2019-09-10 MED ORDER — ACETAMINOPHEN 325 MG PO TABS
650.0000 mg | ORAL_TABLET | Freq: Once | ORAL | Status: AC
  Administered 2019-09-10: 650 mg via ORAL

## 2019-09-10 MED ORDER — PROCHLORPERAZINE EDISYLATE 10 MG/2ML IJ SOLN
INTRAMUSCULAR | Status: AC
  Filled 2019-09-10: qty 2

## 2019-09-10 MED ORDER — FAMOTIDINE IN NACL 20-0.9 MG/50ML-% IV SOLN
INTRAVENOUS | Status: AC
  Filled 2019-09-10: qty 50

## 2019-09-10 MED ORDER — ACETAMINOPHEN 325 MG PO TABS
ORAL_TABLET | ORAL | Status: AC
  Filled 2019-09-10: qty 2

## 2019-09-10 MED ORDER — DIPHENHYDRAMINE HCL 50 MG/ML IJ SOLN
25.0000 mg | Freq: Once | INTRAMUSCULAR | Status: AC
  Administered 2019-09-10: 25 mg via INTRAVENOUS

## 2019-09-10 MED ORDER — TRASTUZUMAB-DKST CHEMO 150 MG IV SOLR
150.0000 mg | Freq: Once | INTRAVENOUS | Status: AC
  Administered 2019-09-10: 150 mg via INTRAVENOUS
  Filled 2019-09-10: qty 7.14

## 2019-09-10 NOTE — Patient Instructions (Signed)
[facility] Discharge Instructions for Patients Receiving Chemotherapy  Today you received the following chemotherapy agents :  Trastuzumab,  Abraxane.  To help prevent nausea and vomiting after your treatment, we encourage you to take your nausea medication as prescribed.   If you develop nausea and vomiting that is not controlled by your nausea medication, call the clinic.   BELOW ARE SYMPTOMS THAT SHOULD BE REPORTED IMMEDIATELY:  *FEVER GREATER THAN 100.5 F  *CHILLS WITH OR WITHOUT FEVER  NAUSEA AND VOMITING THAT IS NOT CONTROLLED WITH YOUR NAUSEA MEDICATION  *UNUSUAL SHORTNESS OF BREATH  *UNUSUAL BRUISING OR BLEEDING  TENDERNESS IN MOUTH AND THROAT WITH OR WITHOUT PRESENCE OF ULCERS  *URINARY PROBLEMS  *BOWEL PROBLEMS  UNUSUAL RASH Items with * indicate a potential emergency and should be followed up as soon as possible.  Feel free to call the clinic should you have any questions or concerns. The clinic phone number is [phone].  Please show the [facility] at check-in to the Emergency Department and triage nurse.

## 2019-09-10 NOTE — Progress Notes (Signed)
[facility]  Telephone:([phone]) [phone] [phone]) [phone]    ID: Megan Khan DOB: Oct 04, 1955  MR#: [id number]  IRS#:[id number]  Patient Care Team: [name], DO as PCP - General [name], [name], MD as PCP - Advanced Heart Failure (Cardiology) [name], [name], MD as Consulting Physician (Oncology) Fanny Skates, MD as Consulting Physician (General Surgery) [name], MD as Consulting Physician (Radiation Oncology) Wallene Huh, DPM as Consulting Physician (Podiatry) Megan Pearson, MD as Consulting Physician (Ophthalmology) [name], MD as Consulting Physician (Gastroenterology) [name], RN as Oncology Nurse Navigator [name], RN as Oncology Nurse Navigator OTHER MD:   CHIEF COMPLAINT: Estrogen receptor negative breast cancer  CURRENT TREATMENT: Abraxane/Trastuzumab   INTERVAL HISTORY: Megan Khan is here for follow up of her HER-2 positive breast cancer.  She is being treated with adjuvant Abraxane and Trastuzumab (ogivri which is biosimilar), first dose on 07/10/2019. She receives this weekly, with 12 weeks plan, and then will continue with trastuzumab every 3 weeks to complete1 year of therapy.    Today is day 1 cycle 10 of her chemoimmunotherapy.  Importantly she has had absolutely no peripheral neuropathy.  Her most recent echocardiogram was on 07/22/2019 and showed an EF of 65-70%.  Her next echocardiogram is already scheduled for October 21, 2019   REVIEW OF SYSTEMS: Megan Khan is doing well today.  She is checking her blood sugars and they have been 130s to 150s.  She has no peripheral neuropathy.  Megan Khan doesn't leave the house due to [place] pandemic.  She keeps active around the house with cleaning, and helps with her special needs son.  At her last visit she was experiencing diarrhea.  She had one episode on Monday, however hasn't had any since then.  She thinks it may be related to her collard green intake.    Megan Khan  denies any fever, chills, chest pain, palpitations, cough, shortness of breath, bladder changes, headaches, skin changes, nausea, vomiting, or any other concerns.  A detailed ROS was otherwise non contributory.  She wants me to review the difference between Trastuzumab and Abraxane, and her overall treatment plan.  HISTORY OF CURRENT ILLNESS: From the original intake note:  Megan Khan had routine screening mammography on 02/24/2019 showing a possible abnormality in the right breast. She underwent unilateral right diagnostic mammography with tomography and right breast ultrasonography at [facility] on 03/04/2019 showing: Breast Density Category B. Additional imaging of the right breast was performed. There is a persistent asymmetry and distortion in the middle third of the upper-outer quadrant of the breast. There are no malignant type microcalcifications. On physical exam, I do not palpate a mass in the upper-outer quadrant of the right breast. Targeted ultrasound is performed, showing an irregular hypoechoic mass in the right breast 11 o'clock 7 cm from the nipple measuring 0.7 x 0.7 x 1.0 cm. Sonographic evaluation of the right axilla does not show any enlarged adenopathy.  Accordingly on 03/11/2019 she proceeded to biopsy of the right breast area in question. The pathology from this procedure showed ([id number]): invasive ductal carcinoma, grade II, 11 o'clock 7 cm from the nipple. Prognostic indicators significant for: estrogen receptor, 0% negative and progesterone receptor, 0% negative. Proliferation marker Ki67 at 10%. HER2 positive (3+) by immunohistochemistry.  The patient's subsequent history is as detailed below.    PAST MEDICAL HISTORY: Past Medical History:  Diagnosis Date  . Anemia    during pregnancy  . Cancer [facility]) 2020   breast  cancer on right  . Diabetes mellitus    Type 2  . Family history of breast cancer   . Family history of throat cancer   . Glaucoma   .  Glaucoma   . Hx of colonic polyps   . Hyperlipidemia   . Hypertension   . Legally blind    completely blind in left eye, low vision in right    PAST SURGICAL HISTORY: Past Surgical History:  Procedure Laterality Date  . BREAST LUMPECTOMY WITH RADIOACTIVE SEED AND SENTINEL LYMPH NODE BIOPSY Right 05/29/2019   Procedure: RIGHT BREAST LUMPECTOMY WITH RADIOACTIVE SEED AND RIGHT DEEP AXILLARY SENTINEL LYMPH NODE BIOPSY INJECT BLUE DYE;  Surgeon: Fanny Skates, MD;  Location: [facility];  Service: General;  Laterality: Right;  . COLONOSCOPY  01/22/2012   Procedure: COLONOSCOPY;  Surgeon: [name], MD;  Location: [facility];  Service: Endoscopy;  Laterality: N/A;  . COLONOSCOPY    . COLONOSCOPY    . PORTACATH PLACEMENT N/A 05/29/2019   Procedure: INSERTION PORT-A-CATH WITH ULTRASOUND;  Surgeon: Fanny Skates, MD;  Location: [facility];  Service: General;  Laterality: N/A;  . RE-EXCISION OF BREAST LUMPECTOMY Right 06/10/2019   Procedure: RE-EXCISION OF RIGHT BREAST LUMPECTOMY MARGINS;  Surgeon: Fanny Skates, MD;  Location: [facility];  Service: General;  Laterality: Right;  . TUBAL LIGATION       FAMILY HISTORY: Family History  Problem Relation Age of Onset  . Breast cancer Sister 67       Her2 +  . Breast cancer Cousin        maternal second cousin, Bilateral mastectomy, diagnosed in her 102s   Megan Khan died from natural causes at age 76. Patients' mother died from natural causes at age 52. The patient has 5 brothers and 3 sisters.  The patient's sister, Megan Khan, is my patient diagnosed with breast cancer at 28.  The cyst cancer was estrogen receptor positive, HER-2 negative.  She received no chemo.  Alpha's maternal 2nd cousin was diagnosed with breast cancer. Patient denies anyone in her family having ovarian, prostate, or pancreatic cancer.    GYNECOLOGIC HISTORY:  No LMP recorded. Patient is postmenopausal. Menarche: 63 years old Age at first live birth: 64 years old GX P:  3 LMP: 19 Contraceptive: no HRT: no  Hysterectomy?: no BSO?: no   SOCIAL HISTORY: (Current as of 03/30/2019) Megan Khan is disabled secondary to her glaucoma.she is legally blind.  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 She is divorced. She lives with her son, Megan Khan who is 76 and is disabled with cerebral palsy.  He does attend school. Her son Megan Khan was killed (gunshot wound) age 31.Marland Kitchen Her son, Megan Khan, is 1 and works for the [employer]. Her daughter, Megan Khan, is 44 and is a Quarry manager. Mar[place]ite has 7 grandchildren and 2 great-grandchildren.  She attends the [employer]: Not in place. She has verbally named her sister, Megan Khan, as her healthcare power of attorney.  At the 03/30/2019 visit she was given the appropriate documents to complete and notarize at her discretion  HEALTH MAINTENANCE: Social History   Tobacco Use  . Smoking status: Former Smoker    Types: Cigarettes    Quit date: 09/23/1995    Years since quitting: 23.9  . Smokeless tobacco: Never Used  Substance Use Topics  . Alcohol use: No  . Drug use: No    Colonoscopy: June 2013/again  in  PAP: Per Dr. Criss Rosales  Bone density: Never  Allergies  Allergen Reactions  . Sulfamethoxazole Rash    REACTION: Questionable allergy ?    Current Outpatient Medications  Medication Sig Dispense Refill  . amLODipine (NORVASC) 10 MG tablet TAKE 1 TABLET(10 MG) BY MOUTH DAILY 90 tablet 3  . bimatoprost (LUMIGAN) 0.01 % SOLN Place 2 drops into the left eye at bedtime.     . blood glucose meter kit and supplies KIT Dispense based on patient and insurance preference. Use up to four times daily as directed. (FOR ICD-9 250.00, 250.01). 1 each 12  . dorzolamide (TRUSOPT) 2 % ophthalmic solution Place 2 drops into the left eye 3 (three) times daily.     Marland Kitchen gabapentin (NEURONTIN) 100 MG capsule TAKE 2 CAPSULES(200 MG) BY MOUTH THREE TIMES DAILY 180 capsule 0  .  lidocaine-prilocaine (EMLA) cream Apply to affected area once 30 g 3  . lisinopril-hydrochlorothiazide (ZESTORETIC) 20-25 MG tablet Take 1 tablet by mouth daily. 90 tablet 3  . lovastatin (MEVACOR) 20 MG tablet TAKE 1 TABLET BY MOUTH EVERY NIGHT AT BEDTIME 90 tablet 3  . magic mouthwash SOLN Take 5 mLs by mouth.    . metFORMIN (GLUCOPHAGE) 1000 MG tablet Take 1 tablet (1,000 mg total) by mouth 2 (two) times daily with a meal. 360 tablet 1  . metoprolol succinate (TOPROL-XL) 50 MG 24 hr tablet TAKE 1 TABLET(50 MG) BY MOUTH DAILY 90 tablet 0  . prochlorperazine (COMPAZINE) 10 MG tablet TAKE 1 TABLET(10 MG) BY MOUTH EVERY 6 HOURS AS NEEDED FOR NAUSEA OR VOMITING 30 tablet 1   No current facility-administered medications for this visit.     OBJECTIVE: Vitals:   09/10/19 0834  BP: 133/85  Pulse: 68  Resp: 17  Temp: 98.3 F (36.8 C)  SpO2: 99%     Body mass index is 30.5 kg/m.   Wt Readings from Last 3 Encounters:  09/10/19 161 lb 6.4 oz (73.2 kg)  09/03/19 161 lb 1.6 oz (73.1 kg)  08/20/19 161 lb 11.2 oz (73.3 kg)  ECOG FS:1 - Symptomatic but completely ambulatory  GENERAL: Patient is a well appearing female in no acute distress HEENT:  Sclerae anicteric. Mask in place.  Neck is supple.  NODES:  No cervical, supraclavicular, or axillary lymphadenopathy palpated.  BREAST EXAM:  Deferred. LUNGS:  Clear to auscultation bilaterally.  No wheezes or rhonchi. HEART:  Regular rate and rhythm. No murmur appreciated. ABDOMEN:  Soft, nontender.  Positive, normoactive bowel sounds. No organomegaly palpated. MSK:  No focal spinal tenderness to palpation. Full range of motion bilaterally in the upper extremities. EXTREMITIES:  No peripheral edema.   SKIN:  Clear with no obvious rashes or skin changes. No nail dyscrasia. NEURO:  Nonfocal. Well oriented.  Appropriate affect.    LAB RESULTS:  CMP     Component Value Date/Time   NA 141 09/03/2019 0754   NA 142 05/27/2018 1533   K 3.6  09/03/2019 0754   CL 106 09/03/2019 0754   CO2 25 09/03/2019 0754   GLUCOSE 139 (H) 09/03/2019 0754   BUN 14 09/03/2019 0754   BUN 19 05/27/2018 1533   CREATININE 0.78 09/03/2019 0754   CREATININE 0.79 01/30/2016 1130   CALCIUM 9.0 09/03/2019 0754   PROT 7.0 09/03/2019 0754   ALBUMIN 4.2 09/03/2019 0754   AST 19 09/03/2019 0754   ALT 36 09/03/2019 0754   ALKPHOS 79 09/03/2019 0754   BILITOT 0.3 09/03/2019 0754   GFRNONAA >60  09/03/2019 0754   GFRNONAA 82 01/30/2016 1130   GFRAA >60 09/03/2019 0754   GFRAA >89 01/30/2016 1130    No results found for: TOTALPROTELP, ALBUMINELP, A1GS, A2GS, BETS, BETA2SER, GAMS, MSPIKE, SPEI  No results found for: KPAFRELGTCHN, LAMBDASER, [facility]  Lab Results  Component Value Date   WBC 6.1 09/10/2019   NEUTROABS 2.7 09/10/2019   HGB 11.7 (L) 09/10/2019   HCT 36.2 09/10/2019   MCV 85.4 09/10/2019   PLT 329 09/10/2019    No results found for: LABCA2  No components found for: DXIPJA250  No results for input(s): INR in the last 168 hours.  No results found for: LABCA2  No results found for: NLZ767  No results found for: HAL937  No results found for: TKW409  No results found for: CA2729  No components found for: HGQUANT  No results found for: CEA1 / No results found for: CEA1   No results found for: AFPTUMOR  No results found for: CHROMOGRNA  No results found for: PSA1  Appointment on 09/10/2019  Component Date Value Ref Range Status  . WBC Count 09/10/2019 6.1  4.0 - 10.5 K/uL Final  . RBC 09/10/2019 4.24  3.87 - 5.11 MIL/uL Final  . Hemoglobin 09/10/2019 11.7* 12.0 - 15.0 g/dL Final  . HCT 09/10/2019 36.2  36.0 - 46.0 % Final  . MCV 09/10/2019 85.4  80.0 - 100.0 fL Final  . MCH 09/10/2019 27.6  26.0 - 34.0 pg Final  . MCHC 09/10/2019 32.3  30.0 - 36.0 g/dL Final  . RDW 09/10/2019 16.2* 11.5 - 15.5 % Final  . Platelet Count 09/10/2019 329  150 - 400 K/uL Final  . nRBC 09/10/2019 0.0  0.0 - 0.2 % Final  .  Neutrophils Relative % 09/10/2019 43  % Final  . Neutro Abs 09/10/2019 2.7  1.7 - 7.7 K/uL Final  . Lymphocytes Relative 09/10/2019 39  % Final  . Lymphs Abs 09/10/2019 2.4  0.7 - 4.0 K/uL Final  . Monocytes Relative 09/10/2019 8  % Final  . Monocytes Absolute 09/10/2019 0.5  0.1 - 1.0 K/uL Final  . Eosinophils Relative 09/10/2019 7  % Final  . Eosinophils Absolute 09/10/2019 0.4  0.0 - 0.5 K/uL Final  . Basophils Relative 09/10/2019 2  % Final  . Basophils Absolute 09/10/2019 0.1  0.0 - 0.1 K/uL Final  . Immature Granulocytes 09/10/2019 1  % Final  . Abs Immature Granulocytes 09/10/2019 0.04  0.00 - 0.07 K/uL Final   Performed at [facility], [place] [address], [place], [place] [address]    (this displays the last labs from the last 3 days)  No results found for: TOTALPROTELP, ALBUMINELP, A1GS, A2GS, BETS, BETA2SER, GAMS, MSPIKE, SPEI (this displays SPEP labs)  No results found for: KPAFRELGTCHN, LAMBDASER, KAPLAMBRATIO (kappa/lambda light chains)  No results found for: HGBA, HGBA2QUANT, HGBFQUANT, HGBSQUAN (Hemoglobinopathy evaluation)   No results found for: LDH  No results found for: IRON, TIBC, IRONPCTSAT (Iron and TIBC)  No results found for: FERRITIN  Urinalysis No results found for: COLORURINE, APPEARANCEUR, LABSPEC, PHURINE, GLUCOSEU, HGBUR, BILIRUBINUR, KETONESUR, PROTEINUR, UROBILINOGEN, NITRITE, LEUKOCYTESUR   STUDIES:  No results found.   ELIGIBLE FOR AVAILABLE RESEARCH PROTOCOL: UPBEAT  ASSESSMENT: 64 y.o. [place], [place] woman status post right breast upper outer quadrant biopsy 03/11/2019 for a clinical t1b N0, stage IA invasive ductal carcinoma, grade 2, estrogen and progesterone receptor negative, HER-2 amplified, with an MIB-1 of 10%.  (1) genetics testing 03/30/2019: declined by patient  (2)  Right breast lumpectomy on 05/29/2019: pT1b pN0, stage IA invasive ductal carciinoma, grade 2,   (a) a total of 5 lymph nodes were negative  for carcinoma  (b) superior margin positive, cleared with addition surgery on 06/10/2019   (c) surgery delayed due to abnormal EKG, patient cleared by Dr. Gwenlyn Found on 04/21/2019  (3) adjuvant chemotherapy to consist of Abraxane weekly x12 with Trastuzumab, starting 07/10/2019  (a) Abraxane given due to diabetes, and inability to tolerate steroid premedication required with Paclitaxel.  (4) trastuzumab to be continued to complete 1 year  (a) baseline echo 04/06/2019 shows an ejection fraction greater than 65%  (b) echo on 07/22/2019 shows EF 65-70%  (c) echo 10/21/2019  (5) adjuvant radiation to follow.  PLAN: Ashara is doing well today.  Khan reviewed her CBC with her which was normal.  Her CMET is pending.  She continues on therapy with weekly Abraxane and Trastuzumab and will receive her tenth cycle of this today.  She is tolerating this well and she has not developed peripheral neuropathy, which we are monitoring her closely for.    Megan Khan is not having any further diarrhea which is good.  She thinks it is related to her greens intake, so she is monitoring this.    Megan Khan reviewed her breast cancer, and her chemotherapy and immunotherapy treatment.  Khan reviewed with her the difference between the chemotherapy and the Trastuzumab.  We discussed that the Abraxane is a chemo given weekly x 12 weeks that will kill dividing cells.  Khan reviewed that her cancer has a target on it called HER-2, and that Trastuzumab binds to this target.  Khan reviewed that this immunotherapy in conjunction with the chemotherapy initially weekly, then immunotherapy alone every three weeks until 06/2020 will target her specific cancer and reduce her risk of recurrence.  She understands this.  We discussed that the trastuzumab is typically very well tolerated, and she will continue echocardiogram every 3 months, however, she will not be at further risk for peripheral neuropathy, nasuea, vomiting, nail changes, etc.    Megan Khan  was recommended to continue with healthy diet and exercise.  We will continue to see her weekly with each treatment, until she completes therapy.  Khan placed a referral to Dr. Lisbeth Renshaw for her to go back and see him to discuss adjuvant radiation therapy.  She was recommended to continue with the appropriate pandemic precautions. She knows to call for any questions that may arise between now and her next appointment.  We are happy to see her sooner if needed.  Total time this encounter: 30 minutes.   Wilber Bihari, NP  09/10/19 8:41 AM Medical Oncology and Hematology [facility] [place], [place] [address] Tel. [phone]    Fax. [phone]

## 2019-09-10 NOTE — Patient Instructions (Signed)
Tunneled Central Venous Catheter Flushing Guide  It is important to flush your tunneled central venous catheter each time you use it, both before and after you use it. Flushing your catheter will help prevent it from clogging. What are the risks? Risks may include:  Infection.  Air getting into the catheter and bloodstream. Supplies needed:  A clean pair of gloves.  A disinfecting wipe. Use an alcohol wipe, chlorhexidine wipe, or iodine wipe as told by your health care provider.  A 10 mL syringe that has been prefilled with saline solution.  An empty 10 mL syringe, if a substance called heparin was injected into your catheter. How to flush your catheter When you flush your catheter, make sure you follow any specific instructions from your health care provider or the manufacturer. These are general guidelines. Flushing your catheter before use If there is heparin in your catheter: 1. Wash your hands with soap and water. 2. Put on gloves. 3. Scrub the injection cap for a minimum of 15 seconds with a disinfecting wipe. 4. Unclamp the catheter. 5. Attach the empty syringe to the injection cap. 6. Pull the syringe plunger back and withdraw 10 mL of blood. 7. Place the syringe into an appropriate waste container. 8. Scrub the injection cap for 15 seconds with a disinfecting wipe. 9. Attach the prefilled syringe to the injection cap. 10. Flush the catheter by pushing the plunger forward until all the liquid from the syringe is in the catheter. 11. Remove the syringe from the injection cap. 12. Clamp the catheter. If there is no heparin in your catheter: 1. Wash your hands with soap and water. 2. Put on gloves. 3. Scrub the injection cap for 15 seconds with a disinfecting wipe. 4. Unclamp the catheter. 5. Attach the prefilled syringe to the injection cap. 6. Flush the catheter by pushing the plunger forward until 5 mL of the liquid from the syringe is in the catheter. 7. Pull back on  the syringe until you see blood in the catheter. 8. If you have been asked to collect any blood, follow your health care provider's instructions. Otherwise, flush the catheter with the rest of the solution from the syringe. 9. Remove the syringe from the injection cap. 10. Clamp the catheter.  Flushing your catheter after use 1. Wash your hands with soap and water. 2. Put on gloves. 3. Scrub the injection cap for 15 seconds with a disinfecting wipe. 4. Unclamp the catheter. 5. Attach the prefilled syringe to the injection cap. 6. Flush the catheter by pushing the plunger forward until all of the liquid from the syringe is in the catheter. 7. Remove the syringe from the injection cap. 8. Clamp the catheter. Problems and solutions  If blood cannot be completely cleared from the injection cap, you may need to have the injection cap replaced.  If the catheter is difficult to flush, use the pulsing method. The pulsing method involves pushing only a few milliliters of solution into the catheter at a time and pausing between pushes.  If you do not see blood in the catheter when you pull back on the syringe, change your body position, such as by raising your arms above your head. Take a deep breath and cough. Then, pull back on the syringe. If you still do not see blood, flush the catheter with a small amount of solution. Then, change positions again and take a breath or cough. Pull back on the syringe again. If you still do not see   blood, finish flushing the catheter and contact your health care provider. Do not use your catheter until your health care provider says it is okay. General tips  Have someone help you flush your catheter, if possible.  Do not force fluid through your catheter.  Do not use a syringe that is larger or smaller than 10 mL. Using a smaller syringe can make the catheter burst.  Do not use your catheter without flushing it first if it has heparin in it. Contact a health  care provider if:  You cannot see any blood in the catheter when you flush it before using it.  Your catheter is difficult to flush. Get help right away if:  You cannot flush the catheter.  The catheter leaks when you flush it or when there is fluid in it.  There are cracks or breaks in the catheter. Summary  It is important to flush your tunneled central venous catheter each time you use it, both before and after you use it.  Scrub the injection cap for 15 seconds with a disinfecting wipe before and after you flush it.  When you flush your catheter, make sure you follow any specific instructions from your health care provider or the manufacturer.  Get help right away if you cannot flush the catheter. This information is not intended to replace advice given to you by your health care provider. Make sure you discuss any questions you have with your health care provider. Document Revised: 04/24/2019 Document Reviewed: 10/15/2018 Elsevier Patient Education  2020 Elsevier Inc.  

## 2019-09-16 ENCOUNTER — Encounter: Payer: Self-pay | Admitting: *Deleted

## 2019-09-17 ENCOUNTER — Encounter: Payer: Self-pay | Admitting: Adult Health

## 2019-09-17 ENCOUNTER — Inpatient Hospital Stay (HOSPITAL_BASED_OUTPATIENT_CLINIC_OR_DEPARTMENT_OTHER): Payer: Medicare Other | Admitting: Adult Health

## 2019-09-17 ENCOUNTER — Inpatient Hospital Stay: Payer: Medicare Other

## 2019-09-17 ENCOUNTER — Inpatient Hospital Stay: Payer: Medicare Other | Attending: Oncology

## 2019-09-17 ENCOUNTER — Other Ambulatory Visit: Payer: Self-pay

## 2019-09-17 VITALS — BP 140/89 | HR 76 | Temp 98.2°F | Resp 17 | Ht 61.0 in | Wt 161.9 lb

## 2019-09-17 DIAGNOSIS — C50411 Malignant neoplasm of upper-outer quadrant of right female breast: Secondary | ICD-10-CM | POA: Insufficient documentation

## 2019-09-17 DIAGNOSIS — Z95828 Presence of other vascular implants and grafts: Secondary | ICD-10-CM

## 2019-09-17 DIAGNOSIS — Z5112 Encounter for antineoplastic immunotherapy: Secondary | ICD-10-CM | POA: Diagnosis present

## 2019-09-17 DIAGNOSIS — Z171 Estrogen receptor negative status [ER-]: Secondary | ICD-10-CM

## 2019-09-17 DIAGNOSIS — E119 Type 2 diabetes mellitus without complications: Secondary | ICD-10-CM | POA: Insufficient documentation

## 2019-09-17 DIAGNOSIS — Z5111 Encounter for antineoplastic chemotherapy: Secondary | ICD-10-CM | POA: Diagnosis present

## 2019-09-17 LAB — CMP (CANCER CENTER ONLY)
ALT: 36 U/L (ref 0–44)
AST: 21 U/L (ref 15–41)
Albumin: 4.1 g/dL (ref 3.5–5.0)
Alkaline Phosphatase: 69 U/L (ref 38–126)
Anion gap: 9 (ref 5–15)
BUN: 11 mg/dL (ref 8–23)
CO2: 27 mmol/L (ref 22–32)
Calcium: 9.3 mg/dL (ref 8.9–10.3)
Chloride: 102 mmol/L (ref 98–111)
Creatinine: 0.79 mg/dL (ref 0.44–1.00)
GFR, Est AFR Am: >60 mL/min (ref >60)
GFR, Estimated: >60 mL/min (ref >60)
Glucose, Bld: 162 mg/dL — ABNORMAL HIGH (ref 70–99)
Potassium: 3.2 mmol/L — ABNORMAL LOW (ref 3.5–5.1)
Sodium: 138 mmol/L (ref 135–145)
Total Bilirubin: 0.4 mg/dL (ref 0.3–1.2)
Total Protein: 6.9 g/dL (ref 6.5–8.1)

## 2019-09-17 LAB — CBC WITH DIFFERENTIAL (CANCER CENTER ONLY)
Abs Immature Granulocytes: 0.08 10*3/uL — ABNORMAL HIGH (ref 0.00–0.07)
Basophils Absolute: 0.1 10*3/uL (ref 0.0–0.1)
Basophils Relative: 1 %
Eosinophils Absolute: 0.4 10*3/uL (ref 0.0–0.5)
Eosinophils Relative: 5 %
HCT: 36.6 % (ref 36.0–46.0)
Hemoglobin: 12 g/dL (ref 12.0–15.0)
Immature Granulocytes: 1 %
Lymphocytes Relative: 40 %
Lymphs Abs: 2.7 10*3/uL (ref 0.7–4.0)
MCH: 27.6 pg (ref 26.0–34.0)
MCHC: 32.8 g/dL (ref 30.0–36.0)
MCV: 84.3 fL (ref 80.0–100.0)
Monocytes Absolute: 0.5 10*3/uL (ref 0.1–1.0)
Monocytes Relative: 8 %
Neutro Abs: 3 10*3/uL (ref 1.7–7.7)
Neutrophils Relative %: 45 %
Platelet Count: 340 10*3/uL (ref 150–400)
RBC: 4.34 MIL/uL (ref 3.87–5.11)
RDW: 16.5 % — ABNORMAL HIGH (ref 11.5–15.5)
WBC Count: 6.7 10*3/uL (ref 4.0–10.5)
nRBC: 0.3 % — ABNORMAL HIGH (ref 0.0–0.2)

## 2019-09-17 MED ORDER — FAMOTIDINE IN NACL 20-0.9 MG/50ML-% IV SOLN
20.0000 mg | Freq: Once | INTRAVENOUS | Status: AC
  Administered 2019-09-17: 20 mg via INTRAVENOUS

## 2019-09-17 MED ORDER — DIPHENHYDRAMINE HCL 50 MG/ML IJ SOLN
25.0000 mg | Freq: Once | INTRAMUSCULAR | Status: AC
  Administered 2019-09-17: 25 mg via INTRAVENOUS

## 2019-09-17 MED ORDER — PROCHLORPERAZINE EDISYLATE 10 MG/2ML IJ SOLN
INTRAMUSCULAR | Status: AC
  Filled 2019-09-17: qty 2

## 2019-09-17 MED ORDER — ACETAMINOPHEN 325 MG PO TABS
650.0000 mg | ORAL_TABLET | Freq: Once | ORAL | Status: AC
  Administered 2019-09-17: 650 mg via ORAL

## 2019-09-17 MED ORDER — SODIUM CHLORIDE 0.9% FLUSH
10.0000 mL | INTRAVENOUS | Status: DC | PRN
  Administered 2019-09-17: 10 mL
  Filled 2019-09-17: qty 10

## 2019-09-17 MED ORDER — DIPHENHYDRAMINE HCL 50 MG/ML IJ SOLN
INTRAMUSCULAR | Status: AC
  Filled 2019-09-17: qty 1

## 2019-09-17 MED ORDER — SODIUM CHLORIDE 0.9 % IV SOLN
Freq: Once | INTRAVENOUS | Status: AC
  Filled 2019-09-17: qty 250

## 2019-09-17 MED ORDER — FAMOTIDINE IN NACL 20-0.9 MG/50ML-% IV SOLN
INTRAVENOUS | Status: AC
  Filled 2019-09-17: qty 50

## 2019-09-17 MED ORDER — HEPARIN SOD (PORK) LOCK FLUSH 100 UNIT/ML IV SOLN
500.0000 [IU] | Freq: Once | INTRAVENOUS | Status: AC | PRN
  Administered 2019-09-17: 500 [IU]
  Filled 2019-09-17: qty 5

## 2019-09-17 MED ORDER — PROCHLORPERAZINE EDISYLATE 10 MG/2ML IJ SOLN
10.0000 mg | Freq: Once | INTRAMUSCULAR | Status: AC
  Administered 2019-09-17: 10 mg via INTRAVENOUS

## 2019-09-17 MED ORDER — ACETAMINOPHEN 325 MG PO TABS
ORAL_TABLET | ORAL | Status: AC
  Filled 2019-09-17: qty 2

## 2019-09-17 MED ORDER — SODIUM CHLORIDE 0.9% FLUSH
10.0000 mL | Freq: Once | INTRAVENOUS | Status: AC
  Administered 2019-09-17: 10 mL
  Filled 2019-09-17: qty 10

## 2019-09-17 MED ORDER — TRASTUZUMAB-DKST CHEMO 150 MG IV SOLR
150.0000 mg | Freq: Once | INTRAVENOUS | Status: AC
  Administered 2019-09-17: 150 mg via INTRAVENOUS
  Filled 2019-09-17: qty 7.14

## 2019-09-17 MED ORDER — PACLITAXEL PROTEIN-BOUND CHEMO INJECTION 100 MG
100.0000 mg/m2 | Freq: Once | INTRAVENOUS | Status: AC
  Administered 2019-09-17: 175 mg via INTRAVENOUS
  Filled 2019-09-17: qty 35

## 2019-09-17 NOTE — Progress Notes (Signed)
[facility]  [phone] [phone]    ID: Megan Khan DOB: 1956/08/04  MR#: [id number]  JYN#:[id number]  Patient Care Team: [name], DO as PCP - General [name], [name], MD as PCP - Advanced Heart Failure (Cardiology) [name], [name], MD as Consulting Physician (Oncology) [name], MD as Consulting Physician (Radiation Oncology) Wallene Huh, DPM as Consulting Physician (Podiatry) [name], MD as Consulting Physician (Ophthalmology) [name], MD as Consulting Physician (Gastroenterology) [name], RN as Oncology Nurse Navigator [name], RN as Oncology Nurse Navigator OTHER MD:   CHIEF COMPLAINT: Estrogen receptor negative breast cancer  CURRENT TREATMENT: Abraxane/Trastuzumab   INTERVAL HISTORY: Megan Khan is here for follow up of her HER-2 positive breast cancer.  She is being treated with adjuvant Abraxane and Trastuzumab (ogivri which is biosimilar), first dose on 07/10/2019. She receives this weekly, with 12 weeks plan, and then will continue with trastuzumab every 3 weeks to complete1 year of therapy.    Today is day 1 cycle 11 of her chemoimmunotherapy. She denies any peripheral neuropathy in her fingertips or toes.    Her most recent echocardiogram was on 07/22/2019 and showed an EF of 65-70%.  Her next echocardiogram is already scheduled for October 21, 2019   REVIEW OF SYSTEMS: Megan Khan is feeling well today.  She has no new issues.  She is without fever or chills.  She has no cough, shortness of breath, nausea, vomiting, headaches, vision changes, bowel/bladder changes.  She is getting a new puppy.  She is looking forward to having her and plans on naming her Tiny.  She is following appropriate pandemic precautions and is looking forward to finishing chemotherapy so she can walk more.    HISTORY OF CURRENT ILLNESS: From the original intake note:  Megan Khan had routine screening  mammography on 02/24/2019 showing a possible abnormality in the right breast. She underwent unilateral right diagnostic mammography with tomography and right breast ultrasonography at [facility] on 03/04/2019 showing: Breast Density Category B. Additional imaging of the right breast was performed. There is a persistent asymmetry and distortion in the middle third of the upper-outer quadrant of the breast. There are no malignant type microcalcifications. On physical exam, I do not palpate a mass in the upper-outer quadrant of the right breast. Targeted ultrasound is performed, showing an irregular hypoechoic mass in the right breast 11 o'clock 7 cm from the nipple measuring 0.7 x 0.7 x 1.0 cm. Sonographic evaluation of the right axilla does not show any enlarged adenopathy.  Accordingly on 03/11/2019 she proceeded to biopsy of the right breast area in question. The pathology from this procedure showed ([id number]): invasive ductal carcinoma, grade II, 11 o'clock 7 cm from the nipple. Prognostic indicators significant for: estrogen receptor, 0% negative and progesterone receptor, 0% negative. Proliferation marker Ki67 at 10%. HER2 positive (3+) by immunohistochemistry.  The patient's subsequent history is as detailed below.    PAST MEDICAL HISTORY: Past Medical History:  Diagnosis Date  . Anemia    during pregnancy  . Cancer [facility]) 2020   breast cancer on right  . Diabetes mellitus    Type 2  . Family history of breast cancer   . Family history of throat cancer   . Glaucoma   . Glaucoma   . Hx of colonic polyps   . Hyperlipidemia   . Hypertension   . Legally blind    completely blind in left eye, low vision in  right    PAST SURGICAL HISTORY: Past Surgical History:  Procedure Laterality Date  . BREAST LUMPECTOMY WITH RADIOACTIVE SEED AND SENTINEL LYMPH NODE BIOPSY Right 05/29/2019   Procedure: RIGHT BREAST LUMPECTOMY WITH RADIOACTIVE SEED AND RIGHT DEEP AXILLARY SENTINEL LYMPH NODE  BIOPSY INJECT BLUE DYE;  Surgeon: Fanny Skates, MD;  Location: [facility];  Service: General;  Laterality: Right;  . COLONOSCOPY  01/22/2012   Procedure: COLONOSCOPY;  Surgeon: [name], MD;  Location: [facility];  Service: Endoscopy;  Laterality: N/A;  . COLONOSCOPY    . COLONOSCOPY    . PORTACATH PLACEMENT N/A 05/29/2019   Procedure: INSERTION PORT-A-CATH WITH ULTRASOUND;  Surgeon: Fanny Skates, MD;  Location: [facility];  Service: General;  Laterality: N/A;  . RE-EXCISION OF BREAST LUMPECTOMY Right 06/10/2019   Procedure: RE-EXCISION OF RIGHT BREAST LUMPECTOMY MARGINS;  Surgeon: Fanny Skates, MD;  Location: [facility];  Service: General;  Laterality: Right;  . TUBAL LIGATION       FAMILY HISTORY: Family History  Problem Relation Age of Onset  . Breast cancer Sister 38       Her2 +  . Breast cancer Cousin        maternal second cousin, Bilateral mastectomy, diagnosed in her 3s   Megan Khan father died from natural causes at age 40. Patients' mother died from natural causes at age 76. The patient has 5 brothers and 3 sisters.  The patient's sister, [name], is my patient diagnosed with breast cancer at 23.  The cyst cancer was estrogen receptor positive, HER-2 negative.  She received no chemo.  Megan Khan's maternal 2nd cousin was diagnosed with breast cancer. Patient denies anyone in her family having ovarian, prostate, or pancreatic cancer.    GYNECOLOGIC HISTORY:  No LMP recorded. Patient is postmenopausal. Menarche: 64 years old Age at first live birth: 64 years old GX P: 3 LMP: 71 Contraceptive: no HRT: no  Hysterectomy?: no BSO?: no   SOCIAL HISTORY: (Current as of 03/30/2019) Megan Khan is disabled secondary to her glaucoma.she is legally blind.  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 She is divorced. She lives with her son, [name] who is 38 and is disabled with cerebral palsy.  He does attend school. Her son [name] was killed (gunshot wound) age 62.Marland Kitchen  Her son, [name], is 87 and works for the [employer]. Her daughter, [name], is 5 and is a Quarry manager. Megan Khan has 7 grandchildren and 2 great-grandchildren.  She attends the [place]: Not in place. She has verbally named her sister, [name], as her healthcare power of attorney.  At the 03/30/2019 visit she was given the appropriate documents to complete and notarize at her discretion  HEALTH MAINTENANCE: Social History   Tobacco Use  . Smoking status: Former Smoker    Types: Cigarettes    Quit date: 09/23/1995    Years since quitting: 24.0  . Smokeless tobacco: Never Used  Substance Use Topics  . Alcohol use: No  . Drug use: No    Colonoscopy: June 2013/again in  PAP: Per Dr. Criss Rosales  Bone density: Never  Allergies  Allergen Reactions  . Sulfamethoxazole Rash    REACTION: Questionable allergy ?    Current Outpatient Medications  Medication Sig Dispense Refill  . amLODipine (NORVASC) 10 MG tablet TAKE 1 TABLET(10 MG) BY MOUTH DAILY 90 tablet 3  . bimatoprost (LUMIGAN) 0.01 % SOLN Place 2 drops into the  left eye at bedtime.     . blood glucose meter kit and supplies KIT Dispense based on patient and insurance preference. Use up to four times daily as directed. (FOR ICD-9 250.00, 250.01). 1 each 12  . dorzolamide (TRUSOPT) 2 % ophthalmic solution Place 2 drops into the left eye 3 (three) times daily.     Marland Kitchen gabapentin (NEURONTIN) 100 MG capsule TAKE 2 CAPSULES(200 MG) BY MOUTH THREE TIMES DAILY 180 capsule 0  . lidocaine-prilocaine (EMLA) cream Apply to affected area once 30 g 3  . lisinopril-hydrochlorothiazide (ZESTORETIC) 20-25 MG tablet Take 1 tablet by mouth daily. 90 tablet 3  . lovastatin (MEVACOR) 20 MG tablet TAKE 1 TABLET BY MOUTH EVERY NIGHT AT BEDTIME 90 tablet 3  . magic mouthwash SOLN Take 5 mLs by mouth.    . metFORMIN (GLUCOPHAGE) 1000 MG tablet Take 1 tablet (1,000 mg total) by mouth 2 (two) times daily with a meal. 360  tablet 1  . metoprolol succinate (TOPROL-XL) 50 MG 24 hr tablet TAKE 1 TABLET(50 MG) BY MOUTH DAILY 90 tablet 0  . prochlorperazine (COMPAZINE) 10 MG tablet TAKE 1 TABLET(10 MG) BY MOUTH EVERY 6 HOURS AS NEEDED FOR NAUSEA OR VOMITING 30 tablet 1   No current facility-administered medications for this visit.     OBJECTIVE: Vitals:   09/17/19 0832  BP: 140/89  Pulse: 76  Resp: 17  Temp: 98.2 F (36.8 C)  SpO2: 99%     Body mass index is 30.59 kg/m.   Wt Readings from Last 3 Encounters:  09/17/19 161 lb 14.4 oz (73.4 kg)  09/10/19 161 lb 6.4 oz (73.2 kg)  09/03/19 161 lb 1.6 oz (73.1 kg)  ECOG FS:1 - Symptomatic but completely ambulatory  GENERAL: Patient is a well appearing female in no acute distress HEENT:  Sclerae anicteric. Mask in place.  Neck is supple.  NODES:  No cervical, supraclavicular, or axillary lymphadenopathy palpated.  BREAST EXAM:  Deferred. LUNGS:  Clear to auscultation bilaterally.  No wheezes or rhonchi. HEART:  Regular rate and rhythm. No murmur appreciated. ABDOMEN:  Soft, nontender.  Positive, normoactive bowel sounds. No organomegaly palpated. MSK:  No focal spinal tenderness to palpation. Full range of motion bilaterally in the upper extremities. EXTREMITIES:  No peripheral edema.   SKIN:  Clear with no obvious rashes or skin changes. No nail dyscrasia. NEURO:  Nonfocal. Well oriented.  Appropriate affect.    LAB RESULTS:  CMP     Component Value Date/Time   NA 138 09/10/2019 0817   NA 142 05/27/2018 1533   K 3.5 09/10/2019 0817   CL 103 09/10/2019 0817   CO2 26 09/10/2019 0817   GLUCOSE 201 (H) 09/10/2019 0817   BUN 12 09/10/2019 0817   BUN 19 05/27/2018 1533   CREATININE 0.83 09/10/2019 0817   CREATININE 0.79 01/30/2016 1130   CALCIUM 9.1 09/10/2019 0817   PROT 6.8 09/10/2019 0817   ALBUMIN 4.1 09/10/2019 0817   AST 18 09/10/2019 0817   ALT 39 09/10/2019 0817   ALKPHOS 79 09/10/2019 0817   BILITOT 0.3 09/10/2019 0817   GFRNONAA >60  09/10/2019 0817   GFRNONAA 82 01/30/2016 1130   GFRAA >60 09/10/2019 0817   GFRAA >89 01/30/2016 1130    No results found for: TOTALPROTELP, ALBUMINELP, A1GS, A2GS, BETS, BETA2SER, GAMS, MSPIKE, SPEI  No results found for: KPAFRELGTCHN, LAMBDASER, KAPLAMBRATIO  Lab Results  Component Value Date   WBC 6.7 09/17/2019   NEUTROABS 3.0 09/17/2019   HGB 12.0 09/17/2019  HCT 36.6 09/17/2019   MCV 84.3 09/17/2019   PLT 340 09/17/2019    No results found for: LABCA2  No components found for: [id number]  No results for input(s): INR in the last 168 hours.  No results found for: LABCA2  No results found for: SRP594  No results found for: VOP929  No results found for: WKM628  No results found for: CA2729  No components found for: HGQUANT  No results found for: CEA1 / No results found for: CEA1   No results found for: AFPTUMOR  No results found for: CHROMOGRNA  No results found for: PSA1  Appointment on 09/17/2019  Component Date Value Ref Range Status  . WBC Count 09/17/2019 6.7  4.0 - 10.5 K/uL Final  . RBC 09/17/2019 4.34  3.87 - 5.11 MIL/uL Final  . Hemoglobin 09/17/2019 12.0  12.0 - 15.0 g/dL Final  . HCT 09/17/2019 36.6  36.0 - 46.0 % Final  . MCV 09/17/2019 84.3  80.0 - 100.0 fL Final  . MCH 09/17/2019 27.6  26.0 - 34.0 pg Final  . MCHC 09/17/2019 32.8  30.0 - 36.0 g/dL Final  . RDW 09/17/2019 16.5* 11.5 - 15.5 % Final  . Platelet Count 09/17/2019 340  150 - 400 K/uL Final  . nRBC 09/17/2019 0.3* 0.0 - 0.2 % Final  . Neutrophils Relative % 09/17/2019 45  % Final  . Neutro Abs 09/17/2019 3.0  1.7 - 7.7 K/uL Final  . Lymphocytes Relative 09/17/2019 40  % Final  . Lymphs Abs 09/17/2019 2.7  0.7 - 4.0 K/uL Final  . Monocytes Relative 09/17/2019 8  % Final  . Monocytes Absolute 09/17/2019 0.5  0.1 - 1.0 K/uL Final  . Eosinophils Relative 09/17/2019 5  % Final  . Eosinophils Absolute 09/17/2019 0.4  0.0 - 0.5 K/uL Final  . Basophils Relative 09/17/2019 1  % Final   . Basophils Absolute 09/17/2019 0.1  0.0 - 0.1 K/uL Final  . Immature Granulocytes 09/17/2019 1  % Final  . Abs Immature Granulocytes 09/17/2019 0.08* 0.00 - 0.07 K/uL Final   Performed at [facility], [place] [place], [place] [address]    (this displays the last labs from the last 3 days)  No results found for: TOTALPROTELP, ALBUMINELP, A1GS, A2GS, BETS, BETA2SER, GAMS, MSPIKE, SPEI (this displays SPEP labs)  No results found for: KPAFRELGTCHN, LAMBDASER, KAPLAMBRATIO (kappa/lambda light chains)  No results found for: HGBA, HGBA2QUANT, HGBFQUANT, HGBSQUAN (Hemoglobinopathy evaluation)   No results found for: LDH  No results found for: IRON, TIBC, IRONPCTSAT (Iron and TIBC)  No results found for: FERRITIN  Urinalysis No results found for: COLORURINE, APPEARANCEUR, LABSPEC, PHURINE, GLUCOSEU, HGBUR, BILIRUBINUR, KETONESUR, PROTEINUR, UROBILINOGEN, NITRITE, LEUKOCYTESUR   STUDIES:  No results found.   ELIGIBLE FOR AVAILABLE RESEARCH PROTOCOL: UPBEAT  ASSESSMENT: 64 y.o. [place], [place] woman status post right breast upper outer quadrant biopsy 03/11/2019 for a clinical t1b N0, stage IA invasive ductal carcinoma, grade 2, estrogen and progesterone receptor negative, HER-2 amplified, with an MIB-1 of 10%.  (1) genetics testing 03/30/2019: declined by patient  (2) Right breast lumpectomy on 05/29/2019: pT1b pN0, stage IA invasive ductal carciinoma, grade 2,   (a) a total of 5 lymph nodes were negative for carcinoma  (b) superior margin positive, cleared with addition surgery on 06/10/2019   (c) surgery delayed due to abnormal EKG, patient cleared by Dr. Gwenlyn Found on 04/21/2019  (3) adjuvant chemotherapy to consist of Abraxane weekly x12 with Trastuzumab, starting 07/10/2019  (a)  Abraxane given due to diabetes, and inability to tolerate steroid premedication required with Paclitaxel.  (4) trastuzumab to be continued to complete 1 year  (a) baseline  echo 04/06/2019 shows an ejection fraction greater than 65%  (b) echo on 07/22/2019 shows EF 65-70%  (c) echo 10/21/2019  (5) adjuvant radiation to follow.  PLAN: Azaliyah is doing well today.  Her labs remain stable and I reviewed them with her in detail.  She will continue on treatment with Abraxane and Trastuzumab.  We have been monitoring her closely for peripheral neuropathy, which she has not developed.    Malachy Mood and I discussed her puppy.  She is very much looking forward to getting her.  I went ahead and requested scheduling of her upcoming Trastuzumab appointments.  I placed a referral to radiation oncology last week.  They have not yet scheduled Megan Khan.  I have asked our navigators, dawn and Varney Biles to follow up on this.    I recommended Megan Khan continue with walking and remain active.  Hillari will return in 1 week for labs, f/u and her final chemotherapy portion of her chemo immunotherapy.  She was recommended to continue with the appropriate pandemic precautions. She knows to call for any questions that may arise between now and her next appointment.  We are happy to see her sooner if needed.  Total time this encounter: 20 minutes.   Wilber Bihari, NP  09/17/19 8:37 AM Medical Oncology and Hematology [facility] [place],  [address] Tel. [phone]    Fax. [phone]

## 2019-09-17 NOTE — Patient Instructions (Signed)
[facility] Discharge Instructions for Patients Receiving Chemotherapy  Today you received the following chemotherapy agents :  Trastuzumab,  Abraxane.  To help prevent nausea and vomiting after your treatment, we encourage you to take your nausea medication as prescribed.   If you develop nausea and vomiting that is not controlled by your nausea medication, call the clinic.   BELOW ARE SYMPTOMS THAT SHOULD BE REPORTED IMMEDIATELY:  *FEVER GREATER THAN 100.5 F  *CHILLS WITH OR WITHOUT FEVER  NAUSEA AND VOMITING THAT IS NOT CONTROLLED WITH YOUR NAUSEA MEDICATION  *UNUSUAL SHORTNESS OF BREATH  *UNUSUAL BRUISING OR BLEEDING  TENDERNESS IN MOUTH AND THROAT WITH OR WITHOUT PRESENCE OF ULCERS  *URINARY PROBLEMS  *BOWEL PROBLEMS  UNUSUAL RASH Items with * indicate a potential emergency and should be followed up as soon as possible.  Feel free to call the clinic should you have any questions or concerns. The clinic phone number is [phone].  Please show the [facility] at check-in to the Emergency Department and triage nurse.

## 2019-09-18 ENCOUNTER — Telehealth: Payer: Self-pay | Admitting: Adult Health

## 2019-09-18 NOTE — Telephone Encounter (Signed)
I talk with patient regarding schedule  

## 2019-09-23 NOTE — Progress Notes (Signed)
[place]  [phone] [phone]) [phone]    ID: Megan Khan DOB: 12-Jul-1956  MR#: [id number]  SAY#:[id number]  Patient Care Team: [name], DO as PCP - General [name], [name], MD as PCP - Advanced Heart Failure (Cardiology) Zeric Baranowski, [name], MD as Consulting Physician (Oncology) [name], MD as Consulting Physician (Radiation Oncology) Paulla Dolly [name], DPM as Consulting Physician (Podiatry) [name], MD as Consulting Physician (Ophthalmology) [name], MD as Consulting Physician (Gastroenterology) [name], RN as Oncology Nurse Navigator [name], RN as Oncology Nurse Navigator OTHER MD:   CHIEF COMPLAINT: Estrogen receptor negative breast cancer  CURRENT TREATMENT: Completing Abraxane/continuing Trastuzumab/Herceptin   INTERVAL HISTORY: Megan Khan is here for follow up and treatment of her HER-2 positive breast cancer.  She is being treated with adjuvant Abraxane and Trastuzumab (ogivri which is biosimilar), first dose on 07/10/2019. She receives this weekly x12 and today is her 12th and last Taxol treatment.  She has tolerated this remarkably well she denies any peripheral neuropathy in her fingertips or toes.  She did lose her hair.  Hopefully the this will now start growing back.  She has been able to maintain a good functional status.   She will continue trastuzumab every 3 weeks to complete a year.  Her most recent echocardiogram was on 07/22/2019 and showed an EF of 65-70%.  Her next echocardiogram is already scheduled for October 21, 2019   REVIEW OF SYSTEMS: Megan Khan tells me she has some ankle swelling at times and sometimes her feet and lower legs hurt.  Sometimes she has some diarrhea but she says not right now.  She tends to sleep during the day and then be up at night.  She is taking appropriate pandemic precautions.  A detailed review of systems today was stable     HISTORY OF CURRENT ILLNESS:  From the original intake note:  Megan Khan had routine screening mammography on 02/24/2019 showing a possible abnormality in the right breast. She underwent unilateral right diagnostic mammography with tomography and right breast ultrasonography at [facility] on 03/04/2019 showing: Breast Density Category B. Additional imaging of the right breast was performed. There is a persistent asymmetry and distortion in the middle third of the upper-outer quadrant of the breast. There are no malignant type microcalcifications. On physical exam, I do not palpate a mass in the upper-outer quadrant of the right breast. Targeted ultrasound is performed, showing an irregular hypoechoic mass in the right breast 11 o'clock 7 cm from the nipple measuring 0.7 x 0.7 x 1.0 cm. Sonographic evaluation of the right axilla does not show any enlarged adenopathy.  Accordingly on 03/11/2019 she proceeded to biopsy of the right breast area in question. The pathology from this procedure showed ([id number]): invasive ductal carcinoma, grade II, 11 o'clock 7 cm from the nipple. Prognostic indicators significant for: estrogen receptor, 0% negative and progesterone receptor, 0% negative. Proliferation marker Ki67 at 10%. HER2 positive (3+) by immunohistochemistry.  The patient's subsequent history is as detailed below.    PAST MEDICAL HISTORY: Past Medical History:  Diagnosis Date  . Anemia    during pregnancy  . Cancer [facility]) 2020   breast cancer on right  . Diabetes mellitus    Type 2  . Family history of breast cancer   . Family history of throat cancer   . Glaucoma   . Glaucoma   . Hx of colonic polyps   . Hyperlipidemia   . Hypertension   .  Legally blind    completely blind in left eye, low vision in right    PAST SURGICAL HISTORY: Past Surgical History:  Procedure Laterality Date  . BREAST LUMPECTOMY WITH RADIOACTIVE SEED AND SENTINEL LYMPH NODE BIOPSY Right 05/29/2019   Procedure: RIGHT BREAST  LUMPECTOMY WITH RADIOACTIVE SEED AND RIGHT DEEP AXILLARY SENTINEL LYMPH NODE BIOPSY INJECT BLUE DYE;  Surgeon: Fanny Skates, MD;  Location: [place];  Service: General;  Laterality: Right;  . COLONOSCOPY  01/22/2012   Procedure: COLONOSCOPY;  Surgeon: [name], MD;  Location: [facility];  Service: Endoscopy;  Laterality: N/A;  . COLONOSCOPY    . COLONOSCOPY    . PORTACATH PLACEMENT N/A 05/29/2019   Procedure: INSERTION PORT-A-CATH WITH ULTRASOUND;  Surgeon: Fanny Skates, MD;  Location: [place];  Service: General;  Laterality: N/A;  . RE-EXCISION OF BREAST LUMPECTOMY Right 06/10/2019   Procedure: RE-EXCISION OF RIGHT BREAST LUMPECTOMY MARGINS;  Surgeon: Fanny Skates, MD;  Location: [place];  Service: General;  Laterality: Right;  . TUBAL LIGATION      FAMILY HISTORY: Family History  Problem Relation Age of Onset  . Breast cancer Sister 24       Her2 +  . Breast cancer Cousin        maternal second cousin, Bilateral mastectomy, diagnosed in her 46s   Wei's father died from natural causes at age 15. Patients' mother died from natural causes at age 33. The patient has 5 brothers and 3 sisters.  The patient's sister, Megan Khan, is my patient diagnosed with breast cancer at 21.  The cyst cancer was estrogen receptor positive, HER-2 negative.  She received no chemo.  Azari's maternal 2nd cousin was diagnosed with breast cancer. Patient denies anyone in her family having ovarian, prostate, or pancreatic cancer.    GYNECOLOGIC HISTORY:  No LMP recorded. Patient is postmenopausal. Menarche: 64 years old Age at first live birth: 64 years old GX P: 3 LMP: 76 Contraceptive: no HRT: no  Hysterectomy?: no BSO?: no   SOCIAL HISTORY: (Current as of 03/30/2019) Megan Khan is disabled secondary to her glaucoma as she is legally blind.  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 She is divorced. She lives with her son, Megan Khan who is 80 and is disabled with cerebral palsy. He  does attend school.  They have a new puppy named Tiny. Her son Megan Khan was killed (gunshot wound) age 109. Her son, Megan Khan, is 77 and works for the [employer]. Her daughter, Megan Khan, is 42 and is a Quarry manager. Megan Khan has 7 grandchildren and 2 great-grandchildren.  She attends the [employer]: Not in place. She has verbally named her sister, Megan Khan, as her healthcare power of attorney.  At the 03/30/2019 visit she was given the appropriate documents to complete and notarize at her discretion   HEALTH MAINTENANCE: Social History   Tobacco Use  . Smoking status: Former Smoker    Types: Cigarettes    Quit date: 09/23/1995    Years since quitting: 24.0  . Smokeless tobacco: Never Used  Substance Use Topics  . Alcohol use: No  . Drug use: No    Colonoscopy: June 2013/again in  PAP: Per Dr. Criss Rosales  Bone density: Never  Allergies  Allergen Reactions  . Sulfamethoxazole Rash    REACTION: Questionable allergy ?    Current Outpatient Medications  Medication Sig Dispense Refill  . amLODipine (NORVASC) 10 MG  tablet TAKE 1 TABLET(10 MG) BY MOUTH DAILY 90 tablet 3  . bimatoprost (LUMIGAN) 0.01 % SOLN Place 2 drops into the left eye at bedtime.     . blood glucose meter kit and supplies KIT Dispense based on patient and insurance preference. Use up to four times daily as directed. (FOR ICD-9 250.00, 250.01). 1 each 12  . dorzolamide (TRUSOPT) 2 % ophthalmic solution Place 2 drops into the left eye 3 (three) times daily.     Marland Kitchen gabapentin (NEURONTIN) 100 MG capsule TAKE 2 CAPSULES(200 MG) BY MOUTH THREE TIMES DAILY 180 capsule 0  . lidocaine-prilocaine (EMLA) cream Apply to affected area once 30 g 3  . lisinopril-hydrochlorothiazide (ZESTORETIC) 20-25 MG tablet Take 1 tablet by mouth daily. 90 tablet 3  . lovastatin (MEVACOR) 20 MG tablet TAKE 1 TABLET BY MOUTH EVERY NIGHT AT BEDTIME 90 tablet 3  . magic mouthwash SOLN Take 5 mLs by mouth.    .  metFORMIN (GLUCOPHAGE) 1000 MG tablet Take 1 tablet (1,000 mg total) by mouth 2 (two) times daily with a meal. 360 tablet 1  . metoprolol succinate (TOPROL-XL) 50 MG 24 hr tablet TAKE 1 TABLET(50 MG) BY MOUTH DAILY 90 tablet 0  . prochlorperazine (COMPAZINE) 10 MG tablet TAKE 1 TABLET(10 MG) BY MOUTH EVERY 6 HOURS AS NEEDED FOR NAUSEA OR VOMITING 30 tablet 1   No current facility-administered medications for this visit.     OBJECTIVE: Middle-aged African-American woman who appears stated age 71:   09/24/19 1033  BP: (!) 142/86  Pulse: 80  Resp: 18  Temp: 98.3 F (36.8 C)  SpO2: 96%     Body mass index is 30.78 kg/m.   Wt Readings from Last 3 Encounters:  09/24/19 162 lb 14.4 oz (73.9 kg)  09/17/19 161 lb 14.4 oz (73.4 kg)  09/10/19 161 lb 6.4 oz (73.2 kg)  ECOG FS:1 - Symptomatic but completely ambulatory   Sclerae unicteric, EOMs intact Wearing a mask No cervical or supraclavicular adenopathy Lungs no rales or rhonchi Heart regular rate and rhythm Abd soft, nontender, positive bowel sounds MSK no focal spinal tenderness, no upper extremity lymphedema Neuro: nonfocal, well oriented, appropriate affect Breasts: The right breast is status post lumpectomy.  The cosmetic result is excellent.  There is no evidence of local recurrence.  The left breast is benign.  Both axillae are benign.   LAB RESULTS:  CMP     Component Value Date/Time   NA 140 09/24/2019 1018   NA 142 05/27/2018 1533   K 3.5 09/24/2019 1018   CL 104 09/24/2019 1018   CO2 27 09/24/2019 1018   GLUCOSE 187 (H) 09/24/2019 1018   BUN 15 09/24/2019 1018   BUN 19 05/27/2018 1533   CREATININE 0.90 09/24/2019 1018   CREATININE 0.79 01/30/2016 1130   CALCIUM 9.5 09/24/2019 1018   PROT 6.9 09/24/2019 1018   ALBUMIN 4.0 09/24/2019 1018   AST 23 09/24/2019 1018   ALT 48 (H) 09/24/2019 1018   ALKPHOS 85 09/24/2019 1018   BILITOT 0.3 09/24/2019 1018   GFRNONAA >60 09/24/2019 1018   GFRNONAA 82 01/30/2016  1130   GFRAA >60 09/24/2019 1018   GFRAA >89 01/30/2016 1130    No results found for: TOTALPROTELP, ALBUMINELP, A1GS, A2GS, BETS, BETA2SER, GAMS, MSPIKE, SPEI  No results found for: KPAFRELGTCHN, LAMBDASER, KAPLAMBRATIO  Lab Results  Component Value Date   WBC 7.0 09/24/2019   NEUTROABS 3.1 09/24/2019   HGB 12.3 09/24/2019   HCT 37.3 09/24/2019  MCV 86.7 09/24/2019   PLT 316 09/24/2019    No results found for: LABCA2  No components found for: WGNFAO130  No results for input(s): INR in the last 168 hours.  No results found for: LABCA2  No results found for: QMV784  No results found for: ONG295  No results found for: MWU132  No results found for: CA2729  No components found for: HGQUANT  No results found for: CEA1 / No results found for: CEA1   No results found for: AFPTUMOR  No results found for: CHROMOGRNA  No results found for: PSA1  Appointment on 09/24/2019  Component Date Value Ref Range Status  . Sodium 09/24/2019 140  135 - 145 mmol/L Final  . Potassium 09/24/2019 3.5  3.5 - 5.1 mmol/L Final  . Chloride 09/24/2019 104  98 - 111 mmol/L Final  . CO2 09/24/2019 27  22 - 32 mmol/L Final  . Glucose, Bld 09/24/2019 187* 70 - 99 mg/dL Final  . BUN 09/24/2019 15  8 - 23 mg/dL Final  . Creatinine 09/24/2019 0.90  0.44 - 1.00 mg/dL Final  . Calcium 09/24/2019 9.5  8.9 - 10.3 mg/dL Final  . Total Protein 09/24/2019 6.9  6.5 - 8.1 g/dL Final  . Albumin 09/24/2019 4.0  3.5 - 5.0 g/dL Final  . AST 09/24/2019 23  15 - 41 U/L Final  . ALT 09/24/2019 48* 0 - 44 U/L Final  . Alkaline Phosphatase 09/24/2019 85  38 - 126 U/L Final  . Total Bilirubin 09/24/2019 0.3  0.3 - 1.2 mg/dL Final  . GFR, Est Non Af Am 09/24/2019 >60  >60 mL/min Final  . GFR, Est AFR Am 09/24/2019 >60  >60 mL/min Final  . Anion gap 09/24/2019 9  5 - 15 Final   Performed at [facility], [place] [address] [place], [place] [address]  . WBC Count 09/24/2019 7.0  4.0 - 10.5  K/uL Final  . RBC 09/24/2019 4.30  3.87 - 5.11 MIL/uL Final  . Hemoglobin 09/24/2019 12.3  12.0 - 15.0 g/dL Final  . HCT 09/24/2019 37.3  36.0 - 46.0 % Final  . MCV 09/24/2019 86.7  80.0 - 100.0 fL Final  . MCH 09/24/2019 28.6  26.0 - 34.0 pg Final  . MCHC 09/24/2019 33.0  30.0 - 36.0 g/dL Final  . RDW 09/24/2019 16.5* 11.5 - 15.5 % Final  . Platelet Count 09/24/2019 316  150 - 400 K/uL Final  . nRBC 09/24/2019 0.0  0.0 - 0.2 % Final  . Neutrophils Relative % 09/24/2019 42  % Final  . Neutro Abs 09/24/2019 3.1  1.7 - 7.7 K/uL Final  . Lymphocytes Relative 09/24/2019 41  % Final  . Lymphs Abs 09/24/2019 2.9  0.7 - 4.0 K/uL Final  . Monocytes Relative 09/24/2019 8  % Final  . Monocytes Absolute 09/24/2019 0.6  0.1 - 1.0 K/uL Final  . Eosinophils Relative 09/24/2019 6  % Final  . Eosinophils Absolute 09/24/2019 0.4  0.0 - 0.5 K/uL Final  . Basophils Relative 09/24/2019 2  % Final  . Basophils Absolute 09/24/2019 0.1  0.0 - 0.1 K/uL Final  . Immature Granulocytes 09/24/2019 1  % Final  . Abs Immature Granulocytes 09/24/2019 0.05  0.00 - 0.07 K/uL Final   Performed at [facility], [place] [address] [place], [place] [address]    (this displays the last labs from the last 3 days)  No results found for: TOTALPROTELP, ALBUMINELP, A1GS, A2GS, BETS, BETA2SER, GAMS,  MSPIKE, SPEI (this displays SPEP labs)  No results found for: KPAFRELGTCHN, LAMBDASER, KAPLAMBRATIO (kappa/lambda light chains)  No results found for: HGBA, HGBA2QUANT, HGBFQUANT, HGBSQUAN (Hemoglobinopathy evaluation)   No results found for: LDH  No results found for: IRON, TIBC, IRONPCTSAT (Iron and TIBC)  No results found for: FERRITIN  Urinalysis No results found for: COLORURINE, APPEARANCEUR, LABSPEC, PHURINE, GLUCOSEU, HGBUR, BILIRUBINUR, KETONESUR, PROTEINUR, UROBILINOGEN, NITRITE, LEUKOCYTESUR   STUDIES:  No results found.   ELIGIBLE FOR AVAILABLE RESEARCH PROTOCOL: UPBEAT   ASSESSMENT: 64 y.o. [place], [place] woman status post right breast upper outer quadrant biopsy 03/11/2019 for a clinical t1b N0, stage IA invasive ductal carcinoma, grade 2, estrogen and progesterone receptor negative, HER-2 amplified, with an MIB-1 of 10%.  (1) genetics testing 03/30/2019: declined by patient  (2) Right breast lumpectomy on 05/29/2019: pT1b pN0, stage IA invasive ductal carciinoma, grade 2,   (a) a total of 5 lymph nodes were negative for carcinoma  (b) superior margin positive, cleared with addition surgery on 06/10/2019   (c) surgery delayed due to abnormal EKG, patient cleared by Dr. Gwenlyn Found on 04/21/2019  (3) adjuvant chemotherapy consisting of Abraxane weekly x12 with Trastuzumab, starting 07/10/2019, completed 09/24/2019  (a) Abraxane given due to diabetes, and inability to tolerate steroid premedication required with Paclitaxel.  (4) trastuzumab to be continued to complete 1 year (through November 2021  (a) baseline echo 04/06/2019 shows an ejection fraction greater than 65%  (b) echo on 07/22/2019 shows EF 65-70%  (c) echo 10/21/2019  (5) adjuvant radiation pending   PLAN: Maryuri has tolerated her Taxol treatments remarkably well.  She continues them today.  She has had no peripheral neuropathy.  She certainly did lose her hair, and is entirely glabrous at present.  I reassured her that she should get her hair back and it should be stronger and curlyer once it does grow back.  She is now ready for adjuvant radiation and I have placed the referral  She is tolerating the Herceptin with no side effects.  She will continue that through November.  She is already set up for echocardiogram in early March  She will see me again in May.  She knows to call for any other issue that may develop before that visit.  Total encounter time 25 minutes.Sarajane Jews C. Malia Corsi, MD  09/24/19 11:01 AM Medical Oncology and Hematology [facility] [place], [place] [address] Tel. [phone]    Fax. [phone]    I, Wilburn Mylar, am acting as scribe for Dr. [name]. Mia Milan.  I, Lurline Del MD, have reviewed the above documentation for accuracy and completeness, and I agree with the above.    *Total Encounter Time as defined by the Centers for Medicare and Medicaid Services includes, in addition to the face-to-face time of a patient visit (documented in the note above) non-face-to-face time: obtaining and reviewing outside history, ordering and reviewing medications, tests or procedures, care coordination (communications with other health care professionals or caregivers) and documentation in the medical record.

## 2019-09-24 ENCOUNTER — Other Ambulatory Visit: Payer: Self-pay | Admitting: Family Medicine

## 2019-09-24 ENCOUNTER — Inpatient Hospital Stay: Payer: Medicare Other

## 2019-09-24 ENCOUNTER — Encounter: Payer: Self-pay | Admitting: *Deleted

## 2019-09-24 ENCOUNTER — Inpatient Hospital Stay (HOSPITAL_BASED_OUTPATIENT_CLINIC_OR_DEPARTMENT_OTHER): Payer: Medicare Other | Admitting: Oncology

## 2019-09-24 ENCOUNTER — Other Ambulatory Visit: Payer: Self-pay

## 2019-09-24 VITALS — BP 142/86 | HR 80 | Temp 98.3°F | Resp 18 | Ht 61.0 in | Wt 162.9 lb

## 2019-09-24 DIAGNOSIS — H401333 Pigmentary glaucoma, bilateral, severe stage: Secondary | ICD-10-CM

## 2019-09-24 DIAGNOSIS — C50411 Malignant neoplasm of upper-outer quadrant of right female breast: Secondary | ICD-10-CM

## 2019-09-24 DIAGNOSIS — K429 Umbilical hernia without obstruction or gangrene: Secondary | ICD-10-CM

## 2019-09-24 DIAGNOSIS — Z171 Estrogen receptor negative status [ER-]: Secondary | ICD-10-CM

## 2019-09-24 DIAGNOSIS — Z5112 Encounter for antineoplastic immunotherapy: Secondary | ICD-10-CM | POA: Diagnosis not present

## 2019-09-24 DIAGNOSIS — E782 Mixed hyperlipidemia: Secondary | ICD-10-CM

## 2019-09-24 DIAGNOSIS — G8929 Other chronic pain: Secondary | ICD-10-CM

## 2019-09-24 DIAGNOSIS — Z95828 Presence of other vascular implants and grafts: Secondary | ICD-10-CM

## 2019-09-24 LAB — CBC WITH DIFFERENTIAL (CANCER CENTER ONLY)
Abs Immature Granulocytes: 0.05 10*3/uL (ref 0.00–0.07)
Basophils Absolute: 0.1 10*3/uL (ref 0.0–0.1)
Basophils Relative: 2 %
Eosinophils Absolute: 0.4 10*3/uL (ref 0.0–0.5)
Eosinophils Relative: 6 %
HCT: 37.3 % (ref 36.0–46.0)
Hemoglobin: 12.3 g/dL (ref 12.0–15.0)
Immature Granulocytes: 1 %
Lymphocytes Relative: 41 %
Lymphs Abs: 2.9 10*3/uL (ref 0.7–4.0)
MCH: 28.6 pg (ref 26.0–34.0)
MCHC: 33 g/dL (ref 30.0–36.0)
MCV: 86.7 fL (ref 80.0–100.0)
Monocytes Absolute: 0.6 10*3/uL (ref 0.1–1.0)
Monocytes Relative: 8 %
Neutro Abs: 3.1 10*3/uL (ref 1.7–7.7)
Neutrophils Relative %: 42 %
Platelet Count: 316 10*3/uL (ref 150–400)
RBC: 4.3 MIL/uL (ref 3.87–5.11)
RDW: 16.5 % — ABNORMAL HIGH (ref 11.5–15.5)
WBC Count: 7 10*3/uL (ref 4.0–10.5)
nRBC: 0 % (ref 0.0–0.2)

## 2019-09-24 LAB — CMP (CANCER CENTER ONLY)
ALT: 48 U/L — ABNORMAL HIGH (ref 0–44)
AST: 23 U/L (ref 15–41)
Albumin: 4 g/dL (ref 3.5–5.0)
Alkaline Phosphatase: 85 U/L (ref 38–126)
Anion gap: 9 (ref 5–15)
BUN: 15 mg/dL (ref 8–23)
CO2: 27 mmol/L (ref 22–32)
Calcium: 9.5 mg/dL (ref 8.9–10.3)
Chloride: 104 mmol/L (ref 98–111)
Creatinine: 0.9 mg/dL (ref 0.44–1.00)
GFR, Est AFR Am: >60 mL/min (ref >60)
GFR, Estimated: >60 mL/min (ref >60)
Glucose, Bld: 187 mg/dL — ABNORMAL HIGH (ref 70–99)
Potassium: 3.5 mmol/L (ref 3.5–5.1)
Sodium: 140 mmol/L (ref 135–145)
Total Bilirubin: 0.3 mg/dL (ref 0.3–1.2)
Total Protein: 6.9 g/dL (ref 6.5–8.1)

## 2019-09-24 MED ORDER — DIPHENHYDRAMINE HCL 50 MG/ML IJ SOLN
25.0000 mg | Freq: Once | INTRAMUSCULAR | Status: AC
  Administered 2019-09-24: 11:00:00 25 mg via INTRAVENOUS

## 2019-09-24 MED ORDER — SODIUM CHLORIDE 0.9% FLUSH
10.0000 mL | Freq: Once | INTRAVENOUS | Status: AC
  Administered 2019-09-24: 10 mL
  Filled 2019-09-24: qty 10

## 2019-09-24 MED ORDER — ACETAMINOPHEN 325 MG PO TABS
650.0000 mg | ORAL_TABLET | Freq: Once | ORAL | Status: AC
  Administered 2019-09-24: 11:00:00 650 mg via ORAL

## 2019-09-24 MED ORDER — SODIUM CHLORIDE 0.9% FLUSH
10.0000 mL | INTRAVENOUS | Status: DC | PRN
  Administered 2019-09-24: 14:00:00 10 mL
  Filled 2019-09-24: qty 10

## 2019-09-24 MED ORDER — DIPHENHYDRAMINE HCL 50 MG/ML IJ SOLN
INTRAMUSCULAR | Status: AC
  Filled 2019-09-24: qty 1

## 2019-09-24 MED ORDER — FAMOTIDINE IN NACL 20-0.9 MG/50ML-% IV SOLN
INTRAVENOUS | Status: AC
  Filled 2019-09-24: qty 50

## 2019-09-24 MED ORDER — HEPARIN SOD (PORK) LOCK FLUSH 100 UNIT/ML IV SOLN
500.0000 [IU] | Freq: Once | INTRAVENOUS | Status: AC | PRN
  Administered 2019-09-24: 14:00:00 500 [IU]
  Filled 2019-09-24: qty 5

## 2019-09-24 MED ORDER — ACETAMINOPHEN 325 MG PO TABS
ORAL_TABLET | ORAL | Status: AC
  Filled 2019-09-24: qty 2

## 2019-09-24 MED ORDER — SODIUM CHLORIDE 0.9 % IV SOLN
Freq: Once | INTRAVENOUS | Status: AC
  Filled 2019-09-24: qty 250

## 2019-09-24 MED ORDER — FAMOTIDINE IN NACL 20-0.9 MG/50ML-% IV SOLN
20.0000 mg | Freq: Once | INTRAVENOUS | Status: AC
  Administered 2019-09-24: 20 mg via INTRAVENOUS

## 2019-09-24 MED ORDER — PROCHLORPERAZINE EDISYLATE 10 MG/2ML IJ SOLN
INTRAMUSCULAR | Status: AC
  Filled 2019-09-24: qty 2

## 2019-09-24 MED ORDER — PROCHLORPERAZINE EDISYLATE 10 MG/2ML IJ SOLN
10.0000 mg | Freq: Once | INTRAMUSCULAR | Status: AC
  Administered 2019-09-24: 10 mg via INTRAVENOUS

## 2019-09-24 MED ORDER — PACLITAXEL PROTEIN-BOUND CHEMO INJECTION 100 MG
100.0000 mg/m2 | Freq: Once | INTRAVENOUS | Status: AC
  Administered 2019-09-24: 12:00:00 175 mg via INTRAVENOUS
  Filled 2019-09-24: qty 35

## 2019-09-24 MED ORDER — TRASTUZUMAB-DKST CHEMO 150 MG IV SOLR
450.0000 mg | Freq: Once | INTRAVENOUS | Status: AC
  Administered 2019-09-24: 13:00:00 450 mg via INTRAVENOUS
  Filled 2019-09-24: qty 21.43

## 2019-09-24 NOTE — Patient Instructions (Signed)
[facility] Discharge Instructions for Patients Receiving Chemotherapy  Today you received the following chemotherapy agents :  Trastuzumab,  Abraxane.  To help prevent nausea and vomiting after your treatment, we encourage you to take your nausea medication as prescribed.   If you develop nausea and vomiting that is not controlled by your nausea medication, call the clinic.   BELOW ARE SYMPTOMS THAT SHOULD BE REPORTED IMMEDIATELY:  *FEVER GREATER THAN 100.5 F  *CHILLS WITH OR WITHOUT FEVER  NAUSEA AND VOMITING THAT IS NOT CONTROLLED WITH YOUR NAUSEA MEDICATION  *UNUSUAL SHORTNESS OF BREATH  *UNUSUAL BRUISING OR BLEEDING  TENDERNESS IN MOUTH AND THROAT WITH OR WITHOUT PRESENCE OF ULCERS  *URINARY PROBLEMS  *BOWEL PROBLEMS  UNUSUAL RASH Items with * indicate a potential emergency and should be followed up as soon as possible.  Feel free to call the clinic should you have any questions or concerns. The clinic phone number is [phone].  Please show the [employer] at check-in to the Emergency Department and triage nurse.

## 2019-09-29 ENCOUNTER — Ambulatory Visit
Admission: RE | Admit: 2019-09-29 | Discharge: 2019-09-29 | Disposition: A | Discharge status: Home / Self Care | Payer: Medicare Other | Source: Ambulatory Visit | Attending: Adult Health | Admitting: Adult Health

## 2019-09-29 ENCOUNTER — Other Ambulatory Visit: Payer: Self-pay

## 2019-09-29 ENCOUNTER — Ambulatory Visit
Admission: RE | Admit: 2019-09-29 | Discharge: 2019-09-29 | Disposition: A | Discharge status: Home / Self Care | Payer: Medicare Other | Source: Ambulatory Visit | Attending: Radiation Oncology | Admitting: Radiation Oncology

## 2019-09-29 ENCOUNTER — Encounter: Payer: Self-pay | Admitting: Radiation Oncology

## 2019-09-29 DIAGNOSIS — Z171 Estrogen receptor negative status [ER-]: Secondary | ICD-10-CM

## 2019-09-29 DIAGNOSIS — C50911 Malignant neoplasm of unspecified site of right female breast: Secondary | ICD-10-CM

## 2019-09-29 NOTE — Progress Notes (Signed)
Radiation Oncology         [phone] ________________________________  Initial Outpatient Consultation - Conducted via telephone due to current COVID-19 concerns for limiting patient exposure  I spoke with the patient to conduct this consult visit via telephone to spare the patient unnecessary potential exposure in the healthcare setting during the current COVID-19 pandemic. The patient was notified in advance and was offered a [place] meeting to allow for face to face communication but unfortunately reported that they did not have the appropriate resources/technology to support such a visit and instead preferred to proceed with a telephone consult.   ________________________________  Name: Megan Khan        MRN: [id number]  Date of Service: 09/29/2019 DOB: 1956/06/23  KP:TWSFK, Nicki Reaper, DO  Causey, Lindsey Cornett*     REFERRING PHYSICIAN: Wilber Bihari Cornett*   DIAGNOSIS: The encounter diagnosis was Malignant neoplasm of right breast in female, estrogen receptor negative, unspecified site of breast ([place]).   HISTORY OF PRESENT ILLNESS: Megan Khan is a 64 y.o. female seen for recent diagnosis of right breast cancer.  The patient underwent screening mammogram revealing an asymmetry in the right  breast.  Diagnostic imaging including ultrasound revealed a 1 x 0.7 x 0.7 cm mass at the 11 o'clock position of the right breast.  Her right axilla was negative for adenopathy.  She underwent biopsy on 03/11/2019 which revealed a grade 2 invasive ductal carcinoma.  Her prognostic panel revealed the tumor to be ER/PR negative, HER-2 amplified with a Ki-67 of 10%.  She proceeded with lumpectomy on 05/29/2019 and at the time a grade 2 invasive ductal carcinoma measuring 7 mm was identified.  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  5 sampled lymph nodes were negative for disease.  She did have DCIS focally at the superior and inferior margin and underwent reexcision of  the margins on 06/10/2019 both specimens were negative for residual disease.  Given the HER-2 amplification she was counseled on the rationale for systemic therapy and began chemo including Ogivri HER-2 based therapy on 07/10/2019.  She has completed her course and her last infusion with chemotherapy was on 09/24/2019.  She plans to continue HER-2 based therapy through November 2021.  She is contacted today to discuss the rationale and timing of adjuvant radiotherapy.  PREVIOUS RADIATION THERAPY: No   PAST MEDICAL HISTORY:  Past Medical History:  Diagnosis Date  . Anemia    during pregnancy  . Cancer [facility]) 2020   breast cancer on right  . Diabetes mellitus    Type 2  . Family history of breast cancer   . Family history of throat cancer   . Glaucoma   . Glaucoma   . Hx of colonic polyps   . Hyperlipidemia   . Hypertension   . Legally blind    completely blind in left eye, low vision in right       PAST SURGICAL HISTORY: Past Surgical History:  Procedure Laterality Date  . BREAST LUMPECTOMY WITH RADIOACTIVE SEED AND SENTINEL LYMPH NODE BIOPSY Right 05/29/2019   Procedure: RIGHT BREAST LUMPECTOMY WITH RADIOACTIVE SEED AND RIGHT DEEP AXILLARY SENTINEL LYMPH NODE BIOPSY INJECT BLUE DYE;  Surgeon: Fanny Skates, MD;  Location: [place];  Service: General;  Laterality: Right;  . COLONOSCOPY  01/22/2012   Procedure: COLONOSCOPY;  Surgeon: [name], MD;  Location: [facility];  Service: Endoscopy;  Laterality: N/A;  . COLONOSCOPY    . COLONOSCOPY    .  PORTACATH PLACEMENT N/A 05/29/2019   Procedure: INSERTION PORT-A-CATH WITH ULTRASOUND;  Surgeon: Fanny Skates, MD;  Location: [place];  Service: General;  Laterality: N/A;  . RE-EXCISION OF BREAST LUMPECTOMY Right 06/10/2019   Procedure: RE-EXCISION OF RIGHT BREAST LUMPECTOMY MARGINS;  Surgeon: Fanny Skates, MD;  Location: [place];  Service: General;  Laterality: Right;  . TUBAL LIGATION       FAMILY HISTORY:  Family History    Problem Relation Age of Onset  . Breast cancer Sister 54       Her2 +  . Breast cancer Cousin        maternal second cousin, Bilateral mastectomy, diagnosed in her 79s     SOCIAL HISTORY:  reports that she quit smoking about 24 years ago. Her smoking use included cigarettes. She has never used smokeless tobacco. She reports that she does not drink alcohol or use drugs. The patient is single and lives in [place]. She lives with her son who has cerebral palsy. She is legally blind and relies on family to help her with cleaning and shopping.    ALLERGIES: Sulfamethoxazole   MEDICATIONS:  Current Outpatient Medications  Medication Sig Dispense Refill  . amLODipine (NORVASC) 10 MG tablet TAKE 1 TABLET(10 MG) BY MOUTH DAILY 90 tablet 3  . bimatoprost (LUMIGAN) 0.01 % SOLN Place 2 drops into the left eye at bedtime.     . blood glucose meter kit and supplies KIT Dispense based on patient and insurance preference. Use up to four times daily as directed. (FOR ICD-9 250.00, 250.01). 1 each 12  . dorzolamide (TRUSOPT) 2 % ophthalmic solution Place 2 drops into the left eye 3 (three) times daily.     Marland Kitchen gabapentin (NEURONTIN) 100 MG capsule TAKE 2 CAPSULES(200 MG) BY MOUTH THREE TIMES DAILY 180 capsule 0  . lidocaine-prilocaine (EMLA) cream Apply to affected area once 30 g 3  . lisinopril-hydrochlorothiazide (ZESTORETIC) 20-25 MG tablet Take 1 tablet by mouth daily. 90 tablet 3  . lovastatin (MEVACOR) 20 MG tablet TAKE 1 TABLET BY MOUTH EVERY NIGHT AT BEDTIME 90 tablet 3  . magic mouthwash SOLN Take 5 mLs by mouth.    . metFORMIN (GLUCOPHAGE) 1000 MG tablet Take 1 tablet (1,000 mg total) by mouth 2 (two) times daily with a meal. 360 tablet 1  . metoprolol succinate (TOPROL-XL) 50 MG 24 hr tablet TAKE 1 TABLET(50 MG) BY MOUTH DAILY 90 tablet 0  . prochlorperazine (COMPAZINE) 10 MG tablet TAKE 1 TABLET(10 MG) BY MOUTH EVERY 6 HOURS AS NEEDED FOR NAUSEA OR VOMITING 30 tablet 1   No current  facility-administered medications for this encounter.     REVIEW OF SYSTEMS: On review of systems, the patient reports that she is doing well overall. She is somewhat tired from chemo and had some loose stools that have since resolved. Otherwise she reports she is doing well and denies any chest pain, shortness of breath, cough, fevers, chills, night sweats, unintended weight changes. She denies any bowel or bladder disturbances, and denies abdominal pain, nausea or vomiting. She denies any new musculoskeletal or joint aches or pains, new skin lesions or concerns. A complete review of systems is obtained and is otherwise negative.     PHYSICAL EXAM:  Unable to assess due to encounter type.   ECOG = 0  0 - Asymptomatic (Fully active, able to carry on all predisease activities without restriction)  1 - Symptomatic but completely ambulatory (Restricted in physically strenuous activity but ambulatory and able to  carry out work of a light or sedentary nature. For example, light housework, office work)  2 - Symptomatic, <50% in bed during the day (Ambulatory and capable of all self care but unable to carry out any work activities. Up and about more than 50% of waking hours)  3 - Symptomatic, >50% in bed, but not bedbound (Capable of only limited self-care, confined to bed or chair 50% or more of waking hours)  4 - Bedbound (Completely disabled. Cannot carry on any self-care. Totally confined to bed or chair)  5 - Death   Eustace Pen MM, Creech RH, Tormey DC, et al. [phone]). "Toxicity and response criteria of the [employer]". [place] Oncol. 5 (6): 649-55    LABORATORY DATA:  Lab Results  Component Value Date   WBC 7.0 09/24/2019   HGB 12.3 09/24/2019   HCT 37.3 09/24/2019   MCV 86.7 09/24/2019   PLT 316 09/24/2019   Lab Results  Component Value Date   NA 140 09/24/2019   K 3.5 09/24/2019   CL 104 09/24/2019   CO2 27 09/24/2019   Lab Results  Component  Value Date   ALT 48 (H) 09/24/2019   AST 23 09/24/2019   ALKPHOS 85 09/24/2019   BILITOT 0.3 09/24/2019      RADIOGRAPHY: No results found.     IMPRESSION/PLAN: 1. 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 Dr. Lisbeth Renshaw discusses the patient's final pathology findings and reviews the nature of HER-2 amplified right breast disease. She has done well since surgery and just recently completed chemotherapy. She will be ready in a few weeks to proceed with radiotherapy to the right breast.  We discussed the rationale for the radiation is to reduce the risk of local recurrence.  We discussed the risks, benefits, short, and long term effects of radiotherapy, and the patient is interested in treatment at the appropriate time. Dr. Lisbeth Renshaw discusses the delivery and logistics of radiotherapy and recommends 4 weeks of radiotherapy to the right breast.  She will come for simulation on 10/15/2019 when she will sign written consent to proceed. 2. Visual acuity limitations. Dr. Lisbeth Renshaw approves the patient having a family member accompany her to our office for treatment planning and treatments. We will notify our scheduling and registration staff.   Given current concerns for patient exposure during the COVID-19 pandemic, this encounter was conducted via telephone.  The patient has given verbal consent for this type of encounter. The time spent during this encounter was 45 minutes and 50% of that time was spent in the preparation, discussion, and coordination of her care. The attendants for this meeting include Dr. Lisbeth Renshaw, Blenda Nicely, RN, Shona Simpson, [facility] and Jerilee Hoh.  During the encounter, Dr. Lisbeth Renshaw, and Shona Simpson [facility] were located at [facility] Radiation Oncology Department.  Megan Khan  was located at home.   The above documentation reflects my direct findings during this shared patient visit. Please see the separate note by  Dr. Lisbeth Renshaw on this date for the remainder of the patient's plan of care.    Carola Rhine, PAC

## 2019-09-30 ENCOUNTER — Encounter: Payer: Self-pay | Admitting: *Deleted

## 2019-10-01 ENCOUNTER — Ambulatory Visit: Payer: Medicare Other | Admitting: Radiation Oncology

## 2019-10-13 ENCOUNTER — Other Ambulatory Visit: Payer: Self-pay

## 2019-10-13 ENCOUNTER — Encounter: Payer: Self-pay | Admitting: Family Medicine

## 2019-10-13 ENCOUNTER — Ambulatory Visit (INDEPENDENT_AMBULATORY_CARE_PROVIDER_SITE_OTHER): Payer: Medicare Other | Admitting: Family Medicine

## 2019-10-13 VITALS — BP 130/82 | HR 72 | Wt 163.4 lb

## 2019-10-13 DIAGNOSIS — E1122 Type 2 diabetes mellitus with diabetic chronic kidney disease: Secondary | ICD-10-CM | POA: Diagnosis not present

## 2019-10-13 DIAGNOSIS — E782 Mixed hyperlipidemia: Secondary | ICD-10-CM | POA: Diagnosis not present

## 2019-10-13 DIAGNOSIS — E785 Hyperlipidemia, unspecified: Secondary | ICD-10-CM

## 2019-10-13 DIAGNOSIS — N181 Chronic kidney disease, stage 1: Secondary | ICD-10-CM | POA: Diagnosis not present

## 2019-10-13 DIAGNOSIS — E1169 Type 2 diabetes mellitus with other specified complication: Secondary | ICD-10-CM

## 2019-10-13 LAB — POCT GLYCOSYLATED HEMOGLOBIN (HGB A1C): HbA1c, POC (controlled diabetic range): 7.9 % — AB (ref 0.0–7.0)

## 2019-10-14 ENCOUNTER — Other Ambulatory Visit: Payer: Self-pay | Admitting: *Deleted

## 2019-10-14 DIAGNOSIS — E785 Hyperlipidemia, unspecified: Secondary | ICD-10-CM | POA: Insufficient documentation

## 2019-10-14 DIAGNOSIS — Z171 Estrogen receptor negative status [ER-]: Secondary | ICD-10-CM

## 2019-10-14 DIAGNOSIS — E1169 Type 2 diabetes mellitus with other specified complication: Secondary | ICD-10-CM | POA: Insufficient documentation

## 2019-10-14 DIAGNOSIS — C50411 Malignant neoplasm of upper-outer quadrant of right female breast: Secondary | ICD-10-CM

## 2019-10-14 LAB — LIPID PANEL
Chol/HDL Ratio: 2.8 ratio (ref 0.0–4.4)
Cholesterol, Total: 148 mg/dL (ref 100–199)
HDL: 52 mg/dL (ref >39)
LDL Chol Calc (NIH): 75 mg/dL (ref 0–99)
Triglycerides: 120 mg/dL (ref 0–149)
VLDL Cholesterol Cal: 21 mg/dL (ref 5–40)

## 2019-10-14 MED ORDER — LOVASTATIN 40 MG PO TABS: 40.0000 mg | ORAL_TABLET | Freq: Every day | ORAL | 3 refills | Status: DC

## 2019-10-14 NOTE — Assessment & Plan Note (Signed)
Discussed hyperlipidemia and ASCVD risk calculator with patient.  She is technically 3 years out of the age range for this but given her severity of chronic conditions despite being on low intensity lovastatin, we have discussed going to medium intensity and will increase this to 40 mg

## 2019-10-14 NOTE — Assessment & Plan Note (Signed)
Increasing lovastatin 40 mg

## 2019-10-14 NOTE — Progress Notes (Signed)
    SUBJECTIVE:   CHIEF COMPLAINT / HPI:   She says that she is doing well with her cancer treatments and following up with her cancer doctor.  She is also excited that her diabetes is better controlled since she is started her additional medication, she does have her audio glucometer due to her decreased vision.  She is very grateful for that.  She does consent to a cholesterol test as it is indicated for follow-up at this time.  She has no new physical complaints to discuss  PERTINENT  PMH / PSH: Breast cancer under treatment, uncontrolled diabetes, legally blind due to glaucoma cataracts and diabetes, hyperlipidemia  OBJECTIVE:   BP 130/82   Pulse 72   Wt 163 lb 6.4 oz (74.1 kg)   SpO2 97%   BMI 30.87 kg/m   General: No distress, pleasant and alert Respiratory: No cough or increased work of breathing Cardiac: Regular rate  ASSESSMENT/PLAN:   Hyperlipidemia Discussed hyperlipidemia and ASCVD risk calculator with patient.  She is technically 3 years out of the age range for this but given her severity of chronic conditions despite being on low intensity lovastatin, we have discussed going to medium intensity and will increase this to 40 mg  Hyperlipidemia associated with type 2 diabetes mellitus (HCC) Increasing lovastatin 40 mg  Diabetes mellitus, type 2 (HCC) A1c down to 7.9, believe this is largely due to the additional option of medication now that she has an audio glucometer.     [name], [place]

## 2019-10-14 NOTE — Assessment & Plan Note (Addendum)
>>  ASSESSMENT AND PLAN FOR TYPE 2 DIABETES MELLITUS WITH HYPERGLYCEMIA (HCC) WRITTEN ON 10/14/2019  6:44 PM BY Quint Chestnut, DO  A1c down to 7.9, believe this is largely due to the additional option of medication now that she has an audio glucometer.  >>ASSESSMENT AND PLAN FOR HYPERLIPIDEMIA ASSOCIATED WITH TYPE 2 DIABETES MELLITUS (HCC) WRITTEN ON 10/14/2019  6:44 PM BY Adison Jerger, DO  Increasing lovastatin 40 mg

## 2019-10-15 ENCOUNTER — Inpatient Hospital Stay: Payer: Medicare Other | Attending: Oncology

## 2019-10-15 ENCOUNTER — Ambulatory Visit
Admission: RE | Admit: 2019-10-15 | Discharge: 2019-10-15 | Disposition: A | Discharge status: Home / Self Care | Payer: Medicare Other | Source: Ambulatory Visit | Attending: Radiation Oncology | Admitting: Radiation Oncology

## 2019-10-15 ENCOUNTER — Other Ambulatory Visit: Payer: Self-pay

## 2019-10-15 ENCOUNTER — Inpatient Hospital Stay: Payer: Medicare Other

## 2019-10-15 VITALS — BP 155/96 | HR 72 | Temp 99.1°F | Resp 20 | Wt 158.5 lb

## 2019-10-15 DIAGNOSIS — Z171 Estrogen receptor negative status [ER-]: Secondary | ICD-10-CM

## 2019-10-15 DIAGNOSIS — Z95828 Presence of other vascular implants and grafts: Secondary | ICD-10-CM

## 2019-10-15 DIAGNOSIS — C50411 Malignant neoplasm of upper-outer quadrant of right female breast: Secondary | ICD-10-CM

## 2019-10-15 LAB — CMP (CANCER CENTER ONLY)
ALT: 26 U/L (ref 0–44)
AST: 17 U/L (ref 15–41)
Albumin: 4.1 g/dL (ref 3.5–5.0)
Alkaline Phosphatase: 88 U/L (ref 38–126)
Anion gap: 10 (ref 5–15)
BUN: 12 mg/dL (ref 8–23)
CO2: 26 mmol/L (ref 22–32)
Calcium: 9.2 mg/dL (ref 8.9–10.3)
Chloride: 104 mmol/L (ref 98–111)
Creatinine: 0.8 mg/dL (ref 0.44–1.00)
GFR, Est AFR Am: >60 mL/min (ref >60)
GFR, Estimated: >60 mL/min (ref >60)
Glucose, Bld: 157 mg/dL — ABNORMAL HIGH (ref 70–99)
Potassium: 3.7 mmol/L (ref 3.5–5.1)
Sodium: 140 mmol/L (ref 135–145)
Total Bilirubin: 0.4 mg/dL (ref 0.3–1.2)
Total Protein: 7 g/dL (ref 6.5–8.1)

## 2019-10-15 LAB — CBC WITH DIFFERENTIAL (CANCER CENTER ONLY)
Abs Immature Granulocytes: 0.03 10*3/uL (ref 0.00–0.07)
Basophils Absolute: 0.1 10*3/uL (ref 0.0–0.1)
Basophils Relative: 1 %
Eosinophils Absolute: 0.4 10*3/uL (ref 0.0–0.5)
Eosinophils Relative: 5 %
HCT: 39.2 % (ref 36.0–46.0)
Hemoglobin: 12.7 g/dL (ref 12.0–15.0)
Immature Granulocytes: 0 %
Lymphocytes Relative: 32 %
Lymphs Abs: 2.5 10*3/uL (ref 0.7–4.0)
MCH: 27.7 pg (ref 26.0–34.0)
MCHC: 32.4 g/dL (ref 30.0–36.0)
MCV: 85.6 fL (ref 80.0–100.0)
Monocytes Absolute: 0.7 10*3/uL (ref 0.1–1.0)
Monocytes Relative: 9 %
Neutro Abs: 4 10*3/uL (ref 1.7–7.7)
Neutrophils Relative %: 53 %
Platelet Count: 289 10*3/uL (ref 150–400)
RBC: 4.58 MIL/uL (ref 3.87–5.11)
RDW: 15.4 % (ref 11.5–15.5)
WBC Count: 7.8 10*3/uL (ref 4.0–10.5)
nRBC: 0 % (ref 0.0–0.2)

## 2019-10-15 MED ORDER — ACETAMINOPHEN 325 MG PO TABS
650.0000 mg | ORAL_TABLET | Freq: Once | ORAL | Status: AC
  Administered 2019-10-15: 650 mg via ORAL

## 2019-10-15 MED ORDER — DIPHENHYDRAMINE HCL 25 MG PO CAPS
50.0000 mg | ORAL_CAPSULE | Freq: Once | ORAL | Status: AC
  Administered 2019-10-15: 10:00:00 50 mg via ORAL

## 2019-10-15 MED ORDER — SODIUM CHLORIDE 0.9% FLUSH
10.0000 mL | INTRAVENOUS | Status: DC | PRN
  Administered 2019-10-15: 10 mL
  Filled 2019-10-15: qty 10

## 2019-10-15 MED ORDER — SODIUM CHLORIDE 0.9% FLUSH
10.0000 mL | Freq: Once | INTRAVENOUS | Status: AC
  Administered 2019-10-15: 10 mL
  Filled 2019-10-15: qty 10

## 2019-10-15 MED ORDER — HEPARIN SOD (PORK) LOCK FLUSH 100 UNIT/ML IV SOLN
500.0000 [IU] | Freq: Once | INTRAVENOUS | Status: AC | PRN
  Administered 2019-10-15: 500 [IU]
  Filled 2019-10-15: qty 5

## 2019-10-15 MED ORDER — DIPHENHYDRAMINE HCL 25 MG PO CAPS
ORAL_CAPSULE | ORAL | Status: AC
  Filled 2019-10-15: qty 2

## 2019-10-15 MED ORDER — ACETAMINOPHEN 325 MG PO TABS
ORAL_TABLET | ORAL | Status: AC
  Filled 2019-10-15: qty 2

## 2019-10-15 MED ORDER — TRASTUZUMAB-DKST CHEMO 150 MG IV SOLR
450.0000 mg | Freq: Once | INTRAVENOUS | Status: AC
  Administered 2019-10-15: 450 mg via INTRAVENOUS
  Filled 2019-10-15: qty 21.43

## 2019-10-15 MED ORDER — SODIUM CHLORIDE 0.9 % IV SOLN
Freq: Once | INTRAVENOUS | Status: AC
  Filled 2019-10-15: qty 250

## 2019-10-15 NOTE — Patient Instructions (Signed)

## 2019-10-15 NOTE — Patient Instructions (Signed)
[facility] Discharge Instructions for Patients Receiving Chemotherapy  Today you received the following chemotherapy agents [id number]   To help prevent nausea and vomiting after your treatment, we encourage you to take your nausea medication as directed.   If you develop nausea and vomiting that is not controlled by your nausea medication, call the clinic.   BELOW ARE SYMPTOMS THAT SHOULD BE REPORTED IMMEDIATELY:  *FEVER GREATER THAN 100.5 F  *CHILLS WITH OR WITHOUT FEVER  NAUSEA AND VOMITING THAT IS NOT CONTROLLED WITH YOUR NAUSEA MEDICATION  *UNUSUAL SHORTNESS OF BREATH  *UNUSUAL BRUISING OR BLEEDING  TENDERNESS IN MOUTH AND THROAT WITH OR WITHOUT PRESENCE OF ULCERS  *URINARY PROBLEMS  *BOWEL PROBLEMS  UNUSUAL RASH Items with * indicate a potential emergency and should be followed up as soon as possible.  Feel free to call the clinic should you have any questions or concerns. The clinic phone number is [phone].  Please show the CHEMO ALERT CARD at check-in to the Emergency Department and triage nurse.   

## 2019-10-16 ENCOUNTER — Other Ambulatory Visit: Payer: Self-pay | Admitting: Family Medicine

## 2019-10-17 ENCOUNTER — Other Ambulatory Visit: Payer: Self-pay

## 2019-10-17 ENCOUNTER — Encounter (HOSPITAL_COMMUNITY): Payer: Self-pay

## 2019-10-17 ENCOUNTER — Emergency Department (HOSPITAL_COMMUNITY): Payer: Medicare Other

## 2019-10-17 ENCOUNTER — Inpatient Hospital Stay (HOSPITAL_COMMUNITY)
Admission: EM | Admit: 2019-10-17 | Discharge: 2019-10-21 | DRG: 864 | Disposition: A | Discharge status: Home / Self Care | Payer: Medicare Other | Attending: Internal Medicine | Admitting: Internal Medicine

## 2019-10-17 DIAGNOSIS — E785 Hyperlipidemia, unspecified: Secondary | ICD-10-CM | POA: Diagnosis present

## 2019-10-17 DIAGNOSIS — C50411 Malignant neoplasm of upper-outer quadrant of right female breast: Secondary | ICD-10-CM | POA: Diagnosis not present

## 2019-10-17 DIAGNOSIS — E1122 Type 2 diabetes mellitus with diabetic chronic kidney disease: Secondary | ICD-10-CM

## 2019-10-17 DIAGNOSIS — E1165 Type 2 diabetes mellitus with hyperglycemia: Secondary | ICD-10-CM

## 2019-10-17 DIAGNOSIS — Z7984 Long term (current) use of oral hypoglycemic drugs: Secondary | ICD-10-CM

## 2019-10-17 DIAGNOSIS — H401333 Pigmentary glaucoma, bilateral, severe stage: Secondary | ICD-10-CM

## 2019-10-17 DIAGNOSIS — Z86010 Personal history of colonic polyps: Secondary | ICD-10-CM

## 2019-10-17 DIAGNOSIS — M79662 Pain in left lower leg: Secondary | ICD-10-CM | POA: Diagnosis present

## 2019-10-17 DIAGNOSIS — H4010X3 Unspecified open-angle glaucoma, severe stage: Secondary | ICD-10-CM | POA: Diagnosis present

## 2019-10-17 DIAGNOSIS — D709 Neutropenia, unspecified: Secondary | ICD-10-CM | POA: Diagnosis not present

## 2019-10-17 DIAGNOSIS — D849 Immunodeficiency, unspecified: Secondary | ICD-10-CM | POA: Diagnosis present

## 2019-10-17 DIAGNOSIS — E871 Hypo-osmolality and hyponatremia: Secondary | ICD-10-CM | POA: Diagnosis present

## 2019-10-17 DIAGNOSIS — Z9225 Personal history of immunosupression therapy: Secondary | ICD-10-CM

## 2019-10-17 DIAGNOSIS — E119 Type 2 diabetes mellitus without complications: Secondary | ICD-10-CM

## 2019-10-17 DIAGNOSIS — E876 Hypokalemia: Secondary | ICD-10-CM | POA: Diagnosis present

## 2019-10-17 DIAGNOSIS — R509 Fever, unspecified: Secondary | ICD-10-CM | POA: Diagnosis not present

## 2019-10-17 DIAGNOSIS — U071 COVID-19: Secondary | ICD-10-CM | POA: Diagnosis present

## 2019-10-17 DIAGNOSIS — X58XXXA Exposure to other specified factors, initial encounter: Secondary | ICD-10-CM | POA: Diagnosis present

## 2019-10-17 DIAGNOSIS — T502X5A Adverse effect of carbonic-anhydrase inhibitors, benzothiadiazides and other diuretics, initial encounter: Secondary | ICD-10-CM | POA: Diagnosis present

## 2019-10-17 DIAGNOSIS — Z79899 Other long term (current) drug therapy: Secondary | ICD-10-CM

## 2019-10-17 DIAGNOSIS — Z87891 Personal history of nicotine dependence: Secondary | ICD-10-CM

## 2019-10-17 DIAGNOSIS — H548 Legal blindness, as defined in USA: Secondary | ICD-10-CM | POA: Diagnosis present

## 2019-10-17 DIAGNOSIS — E1142 Type 2 diabetes mellitus with diabetic polyneuropathy: Secondary | ICD-10-CM | POA: Diagnosis present

## 2019-10-17 DIAGNOSIS — Z882 Allergy status to sulfonamides status: Secondary | ICD-10-CM

## 2019-10-17 DIAGNOSIS — E782 Mixed hyperlipidemia: Secondary | ICD-10-CM | POA: Diagnosis not present

## 2019-10-17 DIAGNOSIS — Z803 Family history of malignant neoplasm of breast: Secondary | ICD-10-CM

## 2019-10-17 DIAGNOSIS — Z808 Family history of malignant neoplasm of other organs or systems: Secondary | ICD-10-CM

## 2019-10-17 DIAGNOSIS — E7800 Pure hypercholesterolemia, unspecified: Secondary | ICD-10-CM | POA: Diagnosis present

## 2019-10-17 DIAGNOSIS — Z171 Estrogen receptor negative status [ER-]: Secondary | ICD-10-CM

## 2019-10-17 DIAGNOSIS — N181 Chronic kidney disease, stage 1: Secondary | ICD-10-CM

## 2019-10-17 DIAGNOSIS — Z8719 Personal history of other diseases of the digestive system: Secondary | ICD-10-CM

## 2019-10-17 DIAGNOSIS — I10 Essential (primary) hypertension: Secondary | ICD-10-CM | POA: Diagnosis present

## 2019-10-17 LAB — SARS CORONAVIRUS 2 (TAT 6-24 HRS): SARS Coronavirus 2: POSITIVE — AB

## 2019-10-17 LAB — CBC WITH DIFFERENTIAL/PLATELET
Abs Immature Granulocytes: 0.03 10*3/uL (ref 0.00–0.07)
Basophils Absolute: 0.1 10*3/uL (ref 0.0–0.1)
Basophils Relative: 1 %
Eosinophils Absolute: 0.2 10*3/uL (ref 0.0–0.5)
Eosinophils Relative: 2 %
HCT: 39 % (ref 36.0–46.0)
Hemoglobin: 12.2 g/dL (ref 12.0–15.0)
Immature Granulocytes: 0 %
Lymphocytes Relative: 6 %
Lymphs Abs: 0.6 10*3/uL — ABNORMAL LOW (ref 0.7–4.0)
MCH: 27.7 pg (ref 26.0–34.0)
MCHC: 31.3 g/dL (ref 30.0–36.0)
MCV: 88.6 fL (ref 80.0–100.0)
Monocytes Absolute: 1.4 10*3/uL — ABNORMAL HIGH (ref 0.1–1.0)
Monocytes Relative: 15 %
Neutro Abs: 6.8 10*3/uL (ref 1.7–7.7)
Neutrophils Relative %: 76 %
Platelets: 243 10*3/uL (ref 150–400)
RBC: 4.4 MIL/uL (ref 3.87–5.11)
RDW: 15.4 % (ref 11.5–15.5)
WBC: 9 10*3/uL (ref 4.0–10.5)
nRBC: 0 % (ref 0.0–0.2)

## 2019-10-17 LAB — HIV ANTIBODY (ROUTINE TESTING W REFLEX): HIV Screen 4th Generation wRfx: NONREACTIVE

## 2019-10-17 LAB — COMPREHENSIVE METABOLIC PANEL
ALT: 49 U/L — ABNORMAL HIGH (ref 0–44)
AST: 45 U/L — ABNORMAL HIGH (ref 15–41)
Albumin: 4 g/dL (ref 3.5–5.0)
Alkaline Phosphatase: 69 U/L (ref 38–126)
Anion gap: 9 (ref 5–15)
BUN: 11 mg/dL (ref 8–23)
CO2: 25 mmol/L (ref 22–32)
Calcium: 8.7 mg/dL — ABNORMAL LOW (ref 8.9–10.3)
Chloride: 100 mmol/L (ref 98–111)
Creatinine, Ser: 0.73 mg/dL (ref 0.44–1.00)
GFR calc Af Amer: >60 mL/min (ref >60)
GFR calc non Af Amer: >60 mL/min (ref >60)
Glucose, Bld: 218 mg/dL — ABNORMAL HIGH (ref 70–99)
Potassium: 3.4 mmol/L — ABNORMAL LOW (ref 3.5–5.1)
Sodium: 134 mmol/L — ABNORMAL LOW (ref 135–145)
Total Bilirubin: 0.7 mg/dL (ref 0.3–1.2)
Total Protein: 6.8 g/dL (ref 6.5–8.1)

## 2019-10-17 LAB — GLUCOSE, CAPILLARY
Glucose-Capillary: 162 mg/dL — ABNORMAL HIGH (ref 70–99)
Glucose-Capillary: 158 mg/dL — ABNORMAL HIGH (ref 70–99)

## 2019-10-17 LAB — URINALYSIS, ROUTINE W REFLEX MICROSCOPIC
Bilirubin Urine: NEGATIVE
Glucose, UA: 50 mg/dL — AB
Hgb urine dipstick: NEGATIVE
Ketones, ur: NEGATIVE mg/dL
Leukocytes,Ua: NEGATIVE
Nitrite: NEGATIVE
Protein, ur: NEGATIVE mg/dL
Specific Gravity, Urine: 1.011 (ref 1.005–1.030)
pH: 7 (ref 5.0–8.0)

## 2019-10-17 LAB — LACTIC ACID, PLASMA
Lactic Acid, Venous: 1.2 mmol/L (ref 0.5–1.9)
Lactic Acid, Venous: 1 mmol/L (ref 0.5–1.9)

## 2019-10-17 LAB — APTT: aPTT: 36 seconds (ref 24–36)

## 2019-10-17 LAB — POC SARS CORONAVIRUS 2 AG -  ED: SARS Coronavirus 2 Ag: NEGATIVE

## 2019-10-17 LAB — PROTIME-INR
INR: 1 (ref 0.8–1.2)
Prothrombin Time: 13.4 seconds (ref 11.4–15.2)

## 2019-10-17 MED ORDER — AMLODIPINE BESYLATE 10 MG PO TABS
10.0000 mg | ORAL_TABLET | Freq: Every day | ORAL | Status: DC
  Administered 2019-10-17 – 2019-10-21 (×5): 10 mg via ORAL
  Filled 2019-10-17 (×2): qty 1
  Filled 2019-10-17: qty 2
  Filled 2019-10-17 (×2): qty 1

## 2019-10-17 MED ORDER — GABAPENTIN 100 MG PO CAPS
200.0000 mg | ORAL_CAPSULE | Freq: Two times a day (BID) | ORAL | Status: DC
  Administered 2019-10-17 – 2019-10-21 (×8): 200 mg via ORAL
  Filled 2019-10-17 (×8): qty 2

## 2019-10-17 MED ORDER — HYDROCHLOROTHIAZIDE 25 MG PO TABS
25.0000 mg | ORAL_TABLET | Freq: Every day | ORAL | Status: DC
  Administered 2019-10-17 – 2019-10-21 (×5): 25 mg via ORAL
  Filled 2019-10-17 (×5): qty 1

## 2019-10-17 MED ORDER — DORZOLAMIDE HCL 2 % OP SOLN
2.0000 [drp] | Freq: Three times a day (TID) | OPHTHALMIC | Status: DC
  Administered 2019-10-17 – 2019-10-21 (×11): 2 [drp] via OPHTHALMIC
  Filled 2019-10-17: qty 10

## 2019-10-17 MED ORDER — ACETAMINOPHEN 650 MG RE SUPP: 650.0000 mg | Freq: Four times a day (QID) | RECTAL | Status: DC | PRN

## 2019-10-17 MED ORDER — SODIUM CHLORIDE 0.9 % IV BOLUS
500.0000 mL | Freq: Once | INTRAVENOUS | Status: AC
  Administered 2019-10-17: 500 mL via INTRAVENOUS

## 2019-10-17 MED ORDER — LATANOPROST 0.005 % OP SOLN
1.0000 [drp] | Freq: Every day | OPHTHALMIC | Status: DC
  Administered 2019-10-17 – 2019-10-20 (×3): 1 [drp] via OPHTHALMIC
  Filled 2019-10-17: qty 2.5

## 2019-10-17 MED ORDER — PRAVASTATIN SODIUM 20 MG PO TABS
10.0000 mg | ORAL_TABLET | Freq: Every day | ORAL | Status: DC
  Administered 2019-10-17 – 2019-10-18 (×2): 10 mg via ORAL
  Filled 2019-10-17 (×2): qty 1

## 2019-10-17 MED ORDER — PROCHLORPERAZINE MALEATE 10 MG PO TABS: 5.0000 mg | ORAL_TABLET | Freq: Four times a day (QID) | ORAL | Status: DC | PRN

## 2019-10-17 MED ORDER — LIDOCAINE-PRILOCAINE 2.5-2.5 % EX CREA
TOPICAL_CREAM | Freq: Once | CUTANEOUS | Status: DC
  Filled 2019-10-17: qty 5

## 2019-10-17 MED ORDER — ENOXAPARIN SODIUM 40 MG/0.4ML ~~LOC~~ SOLN
40.0000 mg | SUBCUTANEOUS | Status: DC
  Administered 2019-10-17 – 2019-10-20 (×4): 40 mg via SUBCUTANEOUS
  Filled 2019-10-17 (×4): qty 0.4

## 2019-10-17 MED ORDER — VANCOMYCIN HCL 1500 MG/300ML IV SOLN
1500.0000 mg | Freq: Once | INTRAVENOUS | Status: AC
  Administered 2019-10-17: 1500 mg via INTRAVENOUS
  Filled 2019-10-17: qty 300

## 2019-10-17 MED ORDER — ONDANSETRON HCL 4 MG/2ML IJ SOLN: 4.0000 mg | Freq: Four times a day (QID) | INTRAMUSCULAR | Status: DC | PRN

## 2019-10-17 MED ORDER — LISINOPRIL 20 MG PO TABS
20.0000 mg | ORAL_TABLET | Freq: Every day | ORAL | Status: DC
  Administered 2019-10-17 – 2019-10-21 (×5): 20 mg via ORAL
  Filled 2019-10-17 (×5): qty 1

## 2019-10-17 MED ORDER — SODIUM CHLORIDE 0.9 % IV SOLN
2.0000 g | Freq: Once | INTRAVENOUS | Status: AC
  Administered 2019-10-17: 2 g via INTRAVENOUS
  Filled 2019-10-17: qty 2

## 2019-10-17 MED ORDER — SODIUM CHLORIDE 0.9 % IV BOLUS
1000.0000 mL | Freq: Once | INTRAVENOUS | Status: AC
  Administered 2019-10-17: 1000 mL via INTRAVENOUS

## 2019-10-17 MED ORDER — ONDANSETRON HCL 4 MG PO TABS: 4.0000 mg | ORAL_TABLET | Freq: Four times a day (QID) | ORAL | Status: DC | PRN

## 2019-10-17 MED ORDER — ACETAMINOPHEN 325 MG PO TABS
650.0000 mg | ORAL_TABLET | Freq: Four times a day (QID) | ORAL | Status: DC | PRN
  Administered 2019-10-17 – 2019-10-21 (×8): 650 mg via ORAL
  Filled 2019-10-17 (×8): qty 2

## 2019-10-17 MED ORDER — INSULIN ASPART 100 UNIT/ML ~~LOC~~ SOLN
0.0000 [IU] | Freq: Three times a day (TID) | SUBCUTANEOUS | Status: DC
  Administered 2019-10-17 – 2019-10-18 (×3): 2 [IU] via SUBCUTANEOUS
  Administered 2019-10-18 – 2019-10-20 (×5): 3 [IU] via SUBCUTANEOUS
  Administered 2019-10-20: 2 [IU] via SUBCUTANEOUS
  Administered 2019-10-20 – 2019-10-21 (×3): 3 [IU] via SUBCUTANEOUS
  Filled 2019-10-17: qty 0.09

## 2019-10-17 MED ORDER — METOPROLOL SUCCINATE ER 50 MG PO TB24
50.0000 mg | ORAL_TABLET | Freq: Every day | ORAL | Status: DC
  Administered 2019-10-17 – 2019-10-21 (×5): 50 mg via ORAL
  Filled 2019-10-17 (×5): qty 1

## 2019-10-17 NOTE — Progress Notes (Signed)
Pt's daughter, Azilee Tubby, asking for MD to call her in the morning. Pt informed daughter that she did test positive for COVID. Daughter has questions about pt's POC after positive test. She can be reached at [phone].

## 2019-10-17 NOTE — ED Triage Notes (Signed)
Per EMS daughter called 911 because pt was altered and feverish. EMS got temp of 105-106, gave tylenol 1 G 30 minutes ago. Fever started last night. Breast cancer treatment finished last week

## 2019-10-17 NOTE — ED Provider Notes (Signed)
[facility] DEPT Provider Note   CSN: [id number] Arrival date & time: 10/17/19  2585     History Chief Complaint  Patient presents with  . Fever    Megan Khan is a 64 y.o. female.  The history is provided by the patient.  Fever Max temp prior to arrival:  104 Temp source:  Oral Severity:  Moderate Onset quality:  Gradual Duration:  1 day Timing:  Constant Progression:  Unchanged Chronicity:  New Relieved by:  Acetaminophen Worsened by:  Nothing Associated symptoms: chills, cough (for months), headaches and myalgias   Associated symptoms: no chest pain, no confusion, no congestion, no diarrhea, no dysuria, no ear pain, no nausea, no rash, no rhinorrhea, no somnolence, no sore throat and no vomiting   Risk factors: immunosuppression (currently going through chemotherapy for breast cancer, herceptin infusion two days ago. )        Past Medical History:  Diagnosis Date  . Anemia    during pregnancy  . Cancer [facility]) 2020   breast cancer on right  . Diabetes mellitus    Type 2  . Family history of breast cancer   . Family history of throat cancer   . Glaucoma   . Glaucoma   . Hx of colonic polyps   . Hyperlipidemia   . Hypertension   . Legally blind    completely blind in left eye, low vision in right    Patient Active Problem List   Diagnosis Date Noted  . Hyperlipidemia associated with type 2 diabetes mellitus ([place]) 10/14/2019  . Port-A-Cath in place 08/27/2019  . Preoperative clearance 04/21/2019  . Family history of throat cancer   . Family history of breast cancer   . magrinatb 03/30/2019  . Malignant neoplasm of upper-outer quadrant of right breast in female, estrogen receptor negative ([place]) 03/19/2019  . Osteoarthritis of both shoulders 07/18/2017  . Lumbar radiculopathy 07/18/2017  . Bunion of great toe of right foot 07/18/2017  . Umbilical hernia 27/78/2423  . Post-menopausal bleeding 12/28/2013  . Primary open  angle glaucoma of right eye, severe stage 12/20/2013  . Primary open angle glaucoma of left eye, severe stage 12/14/2013  . Polyp of colon, adenomatous 10/03/2011  . CATARACT, HX OF 06/28/2010  . Hyperlipidemia 01/26/2009  . OBESITY, UNSPECIFIED 11/22/2008  . Diabetes mellitus, type 2 ([place]) 06/23/2008  . Open-angle glaucoma of both eyes, severe stage 05/26/2008  . SYSTOLIC MURMUR 53/61/4431  . HYPERTENSION, BENIGN SYSTEMIC 10/10/2006    Past Surgical History:  Procedure Laterality Date  . BREAST LUMPECTOMY WITH RADIOACTIVE SEED AND SENTINEL LYMPH NODE BIOPSY Right 05/29/2019   Procedure: RIGHT BREAST LUMPECTOMY WITH RADIOACTIVE SEED AND RIGHT DEEP AXILLARY SENTINEL LYMPH NODE BIOPSY INJECT BLUE DYE;  Surgeon: Fanny Skates, MD;  Location: [facility];  Service: General;  Laterality: Right;  . COLONOSCOPY  01/22/2012   Procedure: COLONOSCOPY;  Surgeon: [name], MD;  Location: [facility];  Service: Endoscopy;  Laterality: N/A;  . COLONOSCOPY    . COLONOSCOPY    . PORTACATH PLACEMENT N/A 05/29/2019   Procedure: INSERTION PORT-A-CATH WITH ULTRASOUND;  Surgeon: Fanny Skates, MD;  Location: [facility];  Service: General;  Laterality: N/A;  . RE-EXCISION OF BREAST LUMPECTOMY Right 06/10/2019   Procedure: RE-EXCISION OF RIGHT BREAST LUMPECTOMY MARGINS;  Surgeon: Fanny Skates, MD;  Location: [facility];  Service: General;  Laterality: Right;  . TUBAL LIGATION       OB History    Gravida  3  Para  3   Term  3   Preterm      AB      Living        SAB      TAB      Ectopic      Multiple      Live Births              Family History  Problem Relation Age of Onset  . Breast cancer Sister 42       Her2 +  . Breast cancer Cousin        maternal second cousin, Bilateral mastectomy, diagnosed in her 21s    Social History   Tobacco Use  . Smoking status: Former Smoker    Types: Cigarettes    Quit date: 09/23/1995    Years since quitting: 24.0  . Smokeless tobacco:  Never Used  Substance Use Topics  . Alcohol use: No  . Drug use: No    Home Medications Prior to Admission medications   Medication Sig Start Date End Date Taking? Authorizing Provider  metoprolol succinate (TOPROL-XL) 50 MG 24 hr tablet TAKE 1 TABLET(50 MG) BY MOUTH DAILY 10/16/19   [name], DO  amLODipine (NORVASC) 10 MG tablet TAKE 1 TABLET(10 MG) BY MOUTH DAILY 05/05/19   Criss Rosales, Scott, DO  bimatoprost (LUMIGAN) 0.01 % SOLN Place 2 drops into the left eye at bedtime.     [provider]  blood glucose meter kit and supplies KIT Dispense based on patient and insurance preference. Use up to four times daily as directed. (FOR ICD-9 250.00, 250.01). 09/29/15   Ronnie Doss M, DO  dorzolamide (TRUSOPT) 2 % ophthalmic solution Place 2 drops into the left eye 3 (three) times daily.     [provider]  gabapentin (NEURONTIN) 100 MG capsule TAKE 2 CAPSULES(200 MG) BY MOUTH THREE TIMES DAILY 09/24/19   [name], DO  lidocaine-prilocaine (EMLA) cream Apply to affected area once 06/26/19   Gardenia Phlegm, NP  lisinopril-hydrochlorothiazide (ZESTORETIC) 20-25 MG tablet Take 1 tablet by mouth daily. 05/05/19   [name], DO  lovastatin (MEVACOR) 40 MG tablet Take 1 tablet (40 mg total) by mouth at bedtime. 10/14/19   [name], DO  magic mouthwash SOLN Take 5 mLs by mouth.    [provider]  metFORMIN (GLUCOPHAGE) 1000 MG tablet Take 1 tablet (1,000 mg total) by mouth 2 (two) times daily with a meal. 05/05/19   Bland, Scott, DO  prochlorperazine (COMPAZINE) 10 MG tablet TAKE 1 TABLET(10 MG) BY MOUTH EVERY 6 HOURS AS NEEDED FOR NAUSEA OR VOMITING 08/17/19   Causey, Charlestine Massed, NP    Allergies    Sulfamethoxazole  Review of Systems   Review of Systems  Constitutional: Positive for chills and fever.  HENT: Negative for congestion, ear pain, rhinorrhea and sore throat.   Eyes: Negative for pain and visual disturbance.  Respiratory: Positive for  cough (for months). Negative for shortness of breath.   Cardiovascular: Negative for chest pain and palpitations.  Gastrointestinal: Negative for abdominal pain, diarrhea, nausea and vomiting.  Genitourinary: Negative for dysuria and hematuria.  Musculoskeletal: Positive for myalgias. Negative for arthralgias and back pain.  Skin: Negative for color change and rash.  Neurological: Positive for headaches. Negative for seizures and syncope.  Psychiatric/Behavioral: Negative for confusion.  All other systems reviewed and are negative.   Physical Exam Updated Vital Signs  ED Triage Vitals  Enc Vitals Group     BP  10/17/19 0906 (!) 159/88     Pulse Rate 10/17/19 0906 93     Resp 10/17/19 0906 (!) 22     Temp 10/17/19 0906 100.1 F (37.8 C)     Temp Source 10/17/19 0906 Oral     SpO2 10/17/19 0906 94 %     Weight 10/17/19 0904 158 lb (71.7 kg)     Height 10/17/19 0904 5' 1.5" (1.562 m)     Head Circumference --      Peak Flow --      Pain Score 10/17/19 0903 3     Pain Loc --      Pain Edu? --      Excl. in GC? --     Physical Exam Vitals and nursing note reviewed.  Constitutional:      General: She is not in acute distress.    Appearance: She is well-developed. She is ill-appearing.  HENT:     Head: Normocephalic and atraumatic.     Nose: Nose normal.     Mouth/Throat:     Mouth: Mucous membranes are moist.  Eyes:     Extraocular Movements: Extraocular movements intact.     Conjunctiva/sclera: Conjunctivae normal.  Cardiovascular:     Rate and Rhythm: Normal rate and regular rhythm.     Pulses: Normal pulses.     Heart sounds: Normal heart sounds. No murmur.  Pulmonary:     Effort: Pulmonary effort is normal. No respiratory distress.     Breath sounds: Normal breath sounds.  Abdominal:     General: There is no distension.     Palpations: Abdomen is soft.     Tenderness: There is no abdominal tenderness.  Musculoskeletal:     Cervical back: Normal range of motion  and neck supple.  Skin:    General: Skin is warm and dry.  Neurological:     General: No focal deficit present.     Mental Status: She is alert.     Cranial Nerves: No cranial nerve deficit.     Sensory: No sensory deficit.     Motor: No weakness.  Psychiatric:        Mood and Affect: Mood normal.     ED Results / Procedures / Treatments   Labs (all labs ordered are listed, but only abnormal results are displayed) Labs Reviewed  COMPREHENSIVE METABOLIC PANEL - Abnormal; Notable for the following components:      Result Value   Sodium 134 (*)    Potassium 3.4 (*)    Glucose, Bld 218 (*)    Calcium 8.7 (*)    AST 45 (*)    ALT 49 (*)    All other components within normal limits  CBC WITH DIFFERENTIAL/PLATELET - Abnormal; Notable for the following components:   Lymphs Abs 0.6 (*)    Monocytes Absolute 1.4 (*)    All other components within normal limits  CULTURE, BLOOD (ROUTINE X 2)  CULTURE, BLOOD (ROUTINE X 2)  URINE CULTURE  SARS CORONAVIRUS 2 (TAT 6-24 HRS)  LACTIC ACID, PLASMA  APTT  PROTIME-INR  LACTIC ACID, PLASMA  URINALYSIS, ROUTINE W REFLEX MICROSCOPIC  POC SARS CORONAVIRUS 2 AG -  ED    EKG EKG Interpretation  Date/Time:  Saturday October 17 2019 10:16:49 EST Ventricular Rate:  94 PR Interval:    QRS Duration: 93 QT Interval:  322 QTC Calculation: 403 R Axis:   78 Text Interpretation: Sinus rhythm Consider right atrial enlargement Probable LVH with secondary repol abnrm   Baseline wander in lead(s) V1 Confirmed by Lennice Sites [phone]) on 10/17/2019 10:19:59 AM   Radiology DG Chest Port 1 View  Result Date: 10/17/2019 CLINICAL DATA:  64 year old female with history of fever. EXAM: PORTABLE CHEST 1 VIEW COMPARISON:  Chest x-ray 05/29/2019. FINDINGS: Right subclavian single-lumen porta cath with tip terminating at the superior cavoatrial junction. Lung volumes are normal. No consolidative airspace disease. No pleural effusions. No pneumothorax. No pulmonary  nodule or mass noted. Pulmonary vasculature and the cardiomediastinal silhouette are within normal limits. Atherosclerosis in the thoracic aorta. IMPRESSION: 1. No radiographic evidence of acute cardiopulmonary disease. 2. Aortic atherosclerosis. Electronically Signed   By: Vinnie Langton M.D.   On: 10/17/2019 10:15    Procedures .Critical Care Performed by: Lennice Sites, DO Authorized by: Lennice Sites, DO   Critical care provider statement:    Critical care time (minutes):  35   Critical care was necessary to treat or prevent imminent or life-threatening deterioration of the following conditions:  Sepsis   Critical care was time spent personally by me on the following activities:  Blood draw for specimens, development of treatment plan with patient or surrogate, discussions with primary provider, discussions with consultants, evaluation of patient's response to treatment, examination of patient, obtaining history from patient or surrogate, ordering and performing treatments and interventions, ordering and review of laboratory studies, ordering and review of radiographic studies, pulse oximetry, re-evaluation of patient's condition and review of old charts   I assumed direction of critical care for this patient from another provider in my specialty: no     (including critical care time)  Medications Ordered in ED Medications  ceFEPIme (MAXIPIME) 2 g in sodium chloride 0.9 % 100 mL IVPB (has no administration in time range)  vancomycin (VANCOREADY) IVPB 1500 mg/300 mL (has no administration in time range)  sodium chloride 0.9 % bolus 1,000 mL (1,000 mLs Intravenous New Bag/Given 10/17/19 0930)    ED Course  I have reviewed the triage vital signs and the nursing notes.  Pertinent labs & imaging results that were available during my care of the patient were reviewed by me and considered in my medical decision making (see chart for details).    MDM Rules/Calculators/A&P                       AISLEE LANDGREN is a 64 year old female with history of hypertension, high cholesterol, breast cancer currently on chemotherapy who presents to the ED with fever.  Patient with fever of 104 with EMS.  Was given 1 g of Tylenol prior to arrival.  Temperature is now 100.1.  Otherwise unremarkable vitals.  Patient felt warm, body aches, headaches last night.  Has had chronic cough.  Denies any abdominal pain, chest pain, pain with urination.  She states that she had chemotherapy infusion 2 days ago.  Will evaluate for neutropenic fever, sepsis.  Empirically will give IV vancomycin IV cefepime while awaiting lab work.  Will give normal saline bolus.  Will get blood cultures, chest x-ray, urinalysis, Covid testing.  Patient with no infection on chest x-ray.  No significant leukocytosis or leukopenia.  No significant electrolyte abnormality or kidney injury otherwise.  Lactic acid is normal.  Patient was discussed on the phone with Dr. Alvy Bimler with oncology.  Herceptin typically does not cause a neutropenic fever which is what lab work shows however patient does have a Port-A-Cath and line infection is high on the list and possible bacteremia from that.  She  does not have any signs to suggest meningitis.  No abdominal pain and doubt intra-abdominal process.  Urinalysis is pending but patient does not have any UTI symptoms.  Patient to be admitted to medicine for further follow-up on cultures, symptomatic care.  Covid test has been sent.  This chart was dictated using voice recognition software.  Despite best efforts to proofread,  errors can occur which can change the documentation meaning.    Final Clinical Impression(s) / ED Diagnoses Final diagnoses:  Fever, unknown origin  Personal history of immunosupression therapy    Rx / DC Orders ED Discharge Orders    None       Curatolo, Adam, DO 10/17/19 1040  

## 2019-10-17 NOTE — H&P (Signed)
History and Physical    BRYAN OMURA TTS:[id number] DOB: [birth year] DOA: 10/17/2019  PCP: [name], DO   Patient coming from: Home   Chief Complaint: Fever and malaise.   HPI: Megan Khan is a 64 y.o. female with medical history significant of breast cancer, hypertension, dyslipidemia, type 2 diabetes mellitus, and legally blindmess.  Patient follows up with Dr. Jana Hakim from oncology, she has undergone lumpectomy, chemotherapy and hormonal therapy.  Currently receiving Herceptin via right Port-A-Cath.  Her latest infusion was October 15, 2018, that received with no major complications.  She was at her usual health until yesterday evening that she developed fever while already in bed, it was moderate to severe in intensity, no improving or worsening factors, associated with generalized malaise and fatigue.  Her daughter got concerned about patient's condition and called EMS, she was brought to the hospital for further evaluation.  She denies any sick contacts or recent Covid exposure, she has been using a facial mask in public and maintaining physical distancing.  Has not received Covid vaccine yet.  ED Course: Her T-max 37.8 C, (100.85F), deconditioned and ill looking appearing, mild hyponatremia and hypokalemia, no neutropenia.  Blood cultures were drawn.  Patient received broad-spectrum prophylactic antibiotics, and referred for further evaluation.  Review of Systems:  1. General: Positive subjective fevers at home with no chills, no weight gain or weight loss 2. ENT: No runny nose or sore throat, no hearing disturbances 3. Pulmonary: No dyspnea, cough, wheezing, or hemoptysis 4. Cardiovascular: No angina, claudication, lower extremity edema, pnd or orthopnea 5. Gastrointestinal: No nausea or vomiting, no diarrhea or constipation 6. Hematology: No easy bruisability or frequent infections 7. Urology: No dysuria, hematuria or increased urinary frequency 8. Dermatology: No  rashes. 9. Neurology: No seizures or paresthesias 10. Musculoskeletal: No joint pain or deformities  Past Medical History:  Diagnosis Date  . Anemia    during pregnancy  . Cancer [facility]) 2020   breast cancer on right  . Diabetes mellitus    Type 2  . Family history of breast cancer   . Family history of throat cancer   . Glaucoma   . Glaucoma   . Hx of colonic polyps   . Hyperlipidemia   . Hypertension   . Legally blind    completely blind in left eye, low vision in right    Past Surgical History:  Procedure Laterality Date  . BREAST LUMPECTOMY WITH RADIOACTIVE SEED AND SENTINEL LYMPH NODE BIOPSY Right 05/29/2019   Procedure: RIGHT BREAST LUMPECTOMY WITH RADIOACTIVE SEED AND RIGHT DEEP AXILLARY SENTINEL LYMPH NODE BIOPSY INJECT BLUE DYE;  Surgeon: Fanny Skates, MD;  Location: [place];  Service: General;  Laterality: Right;  . COLONOSCOPY  01/22/2012   Procedure: COLONOSCOPY;  Surgeon: [name], MD;  Location: [facility];  Service: Endoscopy;  Laterality: N/A;  . COLONOSCOPY    . COLONOSCOPY    . PORTACATH PLACEMENT N/A 05/29/2019   Procedure: INSERTION PORT-A-CATH WITH ULTRASOUND;  Surgeon: Fanny Skates, MD;  Location: [place];  Service: General;  Laterality: N/A;  . RE-EXCISION OF BREAST LUMPECTOMY Right 06/10/2019   Procedure: RE-EXCISION OF RIGHT BREAST LUMPECTOMY MARGINS;  Surgeon: Fanny Skates, MD;  Location: [place];  Service: General;  Laterality: Right;  . TUBAL LIGATION       reports that she quit smoking about 24 years ago. Her smoking use included cigarettes. She has never used smokeless tobacco. She reports that she does not drink alcohol or use drugs.  Allergies  Allergen Reactions  . Sulfamethoxazole Rash    REACTION: Questionable allergy ?    Family History  Problem Relation Age of Onset  . Breast cancer Sister 33       Her2 +  . Breast cancer Cousin        maternal second cousin, Bilateral mastectomy, diagnosed in her 9s     Prior to  Admission medications   Medication Sig Start Date End Date Taking? Authorizing Provider  metoprolol succinate (TOPROL-XL) 50 MG 24 hr tablet TAKE 1 TABLET(50 MG) BY MOUTH DAILY 10/16/19   [name], DO  amLODipine (NORVASC) 10 MG tablet TAKE 1 TABLET(10 MG) BY MOUTH DAILY 05/05/19   Criss Rosales, Scott, DO  bimatoprost (LUMIGAN) 0.01 % SOLN Place 2 drops into the left eye at bedtime.     [provider]  blood glucose meter kit and supplies KIT Dispense based on patient and insurance preference. Use up to four times daily as directed. (FOR ICD-9 250.00, 250.01). 09/29/15   Ronnie Doss M, DO  dorzolamide (TRUSOPT) 2 % ophthalmic solution Place 2 drops into the left eye 3 (three) times daily.     [provider]  gabapentin (NEURONTIN) 100 MG capsule TAKE 2 CAPSULES(200 MG) BY MOUTH THREE TIMES DAILY 09/24/19   [name], DO  lidocaine-prilocaine (EMLA) cream Apply to affected area once 06/26/19   Gardenia Phlegm, NP  lisinopril-hydrochlorothiazide (ZESTORETIC) 20-25 MG tablet Take 1 tablet by mouth daily. 05/05/19   [name], DO  lovastatin (MEVACOR) 40 MG tablet Take 1 tablet (40 mg total) by mouth at bedtime. 10/14/19   [name], DO  magic mouthwash SOLN Take 5 mLs by mouth.    [provider]  metFORMIN (GLUCOPHAGE) 1000 MG tablet Take 1 tablet (1,000 mg total) by mouth 2 (two) times daily with a meal. 05/05/19   Bland, Scott, DO  prochlorperazine (COMPAZINE) 10 MG tablet TAKE 1 TABLET(10 MG) BY MOUTH EVERY 6 HOURS AS NEEDED FOR NAUSEA OR VOMITING 08/17/19   Gardenia Phlegm, NP    Physical Exam: Vitals:   10/17/19 0904 10/17/19 0906 10/17/19 0930  BP:  (!) 159/88 (!) 157/91  Pulse:  93 93  Resp:  (!) 22 (!) 29  Temp:  100.1 F (37.8 C)   TempSrc:  Oral   SpO2:  94% 92%  Weight: 71.7 kg    Height: 5' 1.5" (1.562 m)      Vitals:   10/17/19 0904 10/17/19 0906 10/17/19 0930  BP:  (!) 159/88 (!) 157/91  Pulse:  93 93  Resp:  (!) 22 (!) 29    Temp:  100.1 F (37.8 C)   TempSrc:  Oral   SpO2:  94% 92%  Weight: 71.7 kg    Height: 5' 1.5" (1.562 m)     General: deconditioned and ill looking appearing.  Neurology: Awake and alert, non focal Head and Neck. Head normocephalic. Neck supple with no adenopathy or thyromegaly.   E ENT: mild pallor, no icterus, oral mucosa moist Cardiovascular: No JVD. S1-S2 present, rhythmic, no gallops, rubs, or murmurs. No lower extremity edema. Pulmonary:  Positive breath sounds bilaterally, adequate air movement, no wheezing, rhonchi or rales. Gastrointestinal. Abdomen with no organomegaly, non tender, no rebound or guarding Skin. No rashes Musculoskeletal: no joint deformities Right port A Cath site with no local tenderness or erythema.    Labs on Admission: I have personally reviewed following labs and imaging studies  CBC: Recent Labs  Lab 10/15/19 0854 10/17/19  0917  WBC 7.8 9.0  NEUTROABS 4.0 6.8  HGB 12.7 12.2  HCT 39.2 39.0  MCV 85.6 88.6  PLT 289 301   Basic Metabolic Panel: Recent Labs  Lab 10/15/19 0854 10/17/19 0917  NA 140 134*  K 3.7 3.4*  CL 104 100  CO2 26 25  GLUCOSE 157* 218*  BUN 12 11  CREATININE 0.80 0.73  CALCIUM 9.2 8.7*   GFR: Estimated Creatinine Clearance: 66.9 mL/min (by C-G formula based on SCr of 0.73 mg/dL). Liver Function Tests: Recent Labs  Lab 10/15/19 0854 10/17/19 0917  AST 17 45*  ALT 26 49*  ALKPHOS 88 69  BILITOT 0.4 0.7  PROT 7.0 6.8  ALBUMIN 4.1 4.0   No results for input(s): LIPASE, AMYLASE in the last 168 hours. No results for input(s): AMMONIA in the last 168 hours. Coagulation Profile: Recent Labs  Lab 10/17/19 0917  INR 1.0   Cardiac Enzymes: No results for input(s): CKTOTAL, CKMB, CKMBINDEX, TROPONINI in the last 168 hours. BNP (last 3 results) No results for input(s): PROBNP in the last 8760 hours. HbA1C: No results for input(s): HGBA1C in the last 72 hours. CBG: No results for input(s): GLUCAP in the  last 168 hours. Lipid Profile: No results for input(s): CHOL, HDL, LDLCALC, TRIG, CHOLHDL, LDLDIRECT in the last 72 hours. Thyroid Function Tests: No results for input(s): TSH, T4TOTAL, FREET4, T3FREE, THYROIDAB in the last 72 hours. Anemia Panel: No results for input(s): VITAMINB12, FOLATE, FERRITIN, TIBC, IRON, RETICCTPCT in the last 72 hours. Urine analysis: No results found for: COLORURINE, APPEARANCEUR, LABSPEC, [place], GLUCOSEU, HGBUR, BILIRUBINUR, KETONESUR, PROTEINUR, UROBILINOGEN, NITRITE, LEUKOCYTESUR  Radiological Exams on Admission: DG Chest Port 1 View  Result Date: 10/17/2019 CLINICAL DATA:  64 year old female with history of fever. EXAM: PORTABLE CHEST 1 VIEW COMPARISON:  Chest x-ray 05/29/2019. FINDINGS: Right subclavian single-lumen porta cath with tip terminating at the superior cavoatrial junction. Lung volumes are normal. No consolidative airspace disease. No pleural effusions. No pneumothorax. No pulmonary nodule or mass noted. Pulmonary vasculature and the cardiomediastinal silhouette are within normal limits. Atherosclerosis in the thoracic aorta. IMPRESSION: 1. No radiographic evidence of acute cardiopulmonary disease. 2. Aortic atherosclerosis. Electronically Signed   By: Vinnie Langton M.D.   On: 10/17/2019 10:15    EKG: Independently reviewed.  94 bpm, normal axis, normal intervals, sinus rhythm, no ST segment changes, inferior lateral negative T waves, positive LVH.  Assessment/Plan Principal Problem:   Fever Active Problems:   Diabetes mellitus, type 2 (HCC)   Hyperlipidemia   Open-angle glaucoma of both eyes, severe stage   Malignant neoplasm of upper-outer quadrant of right breast in female, estrogen receptor negative ([place])   64 year old female with past medical history of breast cancer currently under chemotherapy, received Trastuzumab 03/04, and developed subjective fevers about 24 hours after infusion.  Temperature 37.8 C, blood pressure 159/88, heart  rate 93, respirate 22-29, oxygen saturation 92 to 94% on room air, she has dry mucous membranes, her lungs are clear to auscultation bilaterally, heart S1-S2 present rhythmic, abdomen is soft, no lower extremity edema. Sodium 134, potassium 3.4, chloride 100, bicarb 25, glucose 218, BUN 11, creatinine 0.73, white count 9.0, hemotwelve 0.3, hematocrit 39.9, platelets 243.  SARS COVID-19 negative (antigen), urinalysis specific gravity 1.011, negative leukocytes.  His chest radiograph is clear for infiltrates.    Patient will be admitted to the hospital with working diagnosis of febrile syndrome, in the setting of immunosuppression related to malignancy.  1.  Febrile syndrome.  Patient has  subjective fevers at home, in emergency department her T-max was 37.8 C, her work-up so far is negative for bacterial infection, including urine analysis and chest radiograph.  No clinical signs of Port-A-Cath infection.  Continue observation over the next 24 hours, follow-up on blood cultures, hold on antibiotics for now.  2.  Hypertension.  Continue blood pressure control with metoprolol, amlodipine, lisinopril and HCTZ.  3.  Type 2 diabetes mellitus/dyslipidemia.  We will hold on Metformin for now, continue glucose coverage and monitoring with insulin sliding scale.  Continue pravastatin.  4.  Glaucoma.  Continue eyedrops.  DVT prophylaxis:  Enoxaparin  Code Status: full  Family Communication: no family at the bedside   Disposition Plan: med surg   Consults called: none   Admission status: Observation.     Asante Blanda Gerome Apley MD [facility]   10/17/2019, 10:41 AM

## 2019-10-17 NOTE — Progress Notes (Signed)
   10/17/19 2114  Vitals  Temp (!) 103.1 F (39.5 C)  Temp Source Oral  BP 139/71  MAP (mmHg) 91  BP Location Left Arm  BP Method Automatic  Patient Position (if appropriate) Lying  Pulse Rate 99  Resp (!) 22  Oxygen Therapy  SpO2 100 %  O2 Device Room Air  MEWS Score  MEWS Temp 2  MEWS Systolic 0  MEWS Pulse 0  MEWS RR 1  MEWS LOC 0  MEWS Score 3  MEWS Score Color Yellow  MEWS Assessment  Is this an acute change? Yes  MEWS guidelines implemented *See Row Information* Yellow  Provider Notification  Provider Name/Title Jeannette Corpus Jeannette Corpus)  Date Provider Notified 10/17/19  Time Provider Notified 2207  Notification Type Page  Notification Reason Change in status  Response No new orders   MD made aware

## 2019-10-17 NOTE — Progress Notes (Signed)
A consult was received from an ED physician for vancomycin and cefepime  per pharmacy dosing.     The patient's profile has been reviewed for ht/wt/allergies/indication/available labs.   A one time order has been placed for Vancomycin 1500 mg IV x1 and cefepime 2 gr IV x1 .    Further antibiotics/pharmacy consults should be ordered by admitting physician if indicated.                       Thank you,   Royetta Asal, PharmD, BCPS 10/17/2019 9:34 AM

## 2019-10-17 NOTE — ED Notes (Signed)
Spoke with patient's sister, [name], at length. Patient is fine with being admitted, hospitalist did not have any disagreements with patient either on admission need. Patient's sister is adamant that patient will be going home. Exlained gravity of 106 temp and risks. Told sister that doctor will come out when able and speak to her as she demands. Meanwhile I asked sister to contact patient's oncologist. Sister says recent immunotherapy was started Thursday and notified fever would be possible. Explained to sister that we cannot be certain until further research that cause is immunotherapy.

## 2019-10-17 NOTE — Progress Notes (Addendum)
Patient is SARS COVID 19 positive.   No evidence of pneumonia or hypoxemia.   Will continue to hold on steroids or antivirals for now.  Continue close monitoring, under airborne and contact precautions.

## 2019-10-18 ENCOUNTER — Encounter (HOSPITAL_COMMUNITY): Payer: Medicare Other

## 2019-10-18 DIAGNOSIS — E782 Mixed hyperlipidemia: Secondary | ICD-10-CM | POA: Diagnosis not present

## 2019-10-18 DIAGNOSIS — Z8719 Personal history of other diseases of the digestive system: Secondary | ICD-10-CM | POA: Diagnosis not present

## 2019-10-18 DIAGNOSIS — Z171 Estrogen receptor negative status [ER-]: Secondary | ICD-10-CM | POA: Diagnosis not present

## 2019-10-18 DIAGNOSIS — U071 COVID-19: Secondary | ICD-10-CM | POA: Diagnosis present

## 2019-10-18 DIAGNOSIS — E1122 Type 2 diabetes mellitus with diabetic chronic kidney disease: Secondary | ICD-10-CM | POA: Diagnosis not present

## 2019-10-18 DIAGNOSIS — Z9225 Personal history of immunosupression therapy: Secondary | ICD-10-CM

## 2019-10-18 DIAGNOSIS — Z882 Allergy status to sulfonamides status: Secondary | ICD-10-CM | POA: Diagnosis not present

## 2019-10-18 DIAGNOSIS — D709 Neutropenia, unspecified: Secondary | ICD-10-CM | POA: Diagnosis not present

## 2019-10-18 DIAGNOSIS — E871 Hypo-osmolality and hyponatremia: Secondary | ICD-10-CM | POA: Diagnosis present

## 2019-10-18 DIAGNOSIS — Z803 Family history of malignant neoplasm of breast: Secondary | ICD-10-CM | POA: Diagnosis not present

## 2019-10-18 DIAGNOSIS — T502X5A Adverse effect of carbonic-anhydrase inhibitors, benzothiadiazides and other diuretics, initial encounter: Secondary | ICD-10-CM | POA: Diagnosis present

## 2019-10-18 DIAGNOSIS — R52 Pain, unspecified: Secondary | ICD-10-CM | POA: Diagnosis not present

## 2019-10-18 DIAGNOSIS — D849 Immunodeficiency, unspecified: Secondary | ICD-10-CM | POA: Diagnosis present

## 2019-10-18 DIAGNOSIS — R509 Fever, unspecified: Secondary | ICD-10-CM | POA: Diagnosis present

## 2019-10-18 DIAGNOSIS — H548 Legal blindness, as defined in USA: Secondary | ICD-10-CM | POA: Diagnosis present

## 2019-10-18 DIAGNOSIS — Z87891 Personal history of nicotine dependence: Secondary | ICD-10-CM | POA: Diagnosis not present

## 2019-10-18 DIAGNOSIS — C50411 Malignant neoplasm of upper-outer quadrant of right female breast: Secondary | ICD-10-CM | POA: Diagnosis present

## 2019-10-18 DIAGNOSIS — M79662 Pain in left lower leg: Secondary | ICD-10-CM | POA: Diagnosis present

## 2019-10-18 DIAGNOSIS — N181 Chronic kidney disease, stage 1: Secondary | ICD-10-CM | POA: Diagnosis not present

## 2019-10-18 DIAGNOSIS — Z808 Family history of malignant neoplasm of other organs or systems: Secondary | ICD-10-CM | POA: Diagnosis not present

## 2019-10-18 DIAGNOSIS — Z86010 Personal history of colonic polyps: Secondary | ICD-10-CM | POA: Diagnosis not present

## 2019-10-18 DIAGNOSIS — I10 Essential (primary) hypertension: Secondary | ICD-10-CM | POA: Diagnosis present

## 2019-10-18 DIAGNOSIS — Z7984 Long term (current) use of oral hypoglycemic drugs: Secondary | ICD-10-CM | POA: Diagnosis not present

## 2019-10-18 DIAGNOSIS — X58XXXA Exposure to other specified factors, initial encounter: Secondary | ICD-10-CM | POA: Diagnosis present

## 2019-10-18 DIAGNOSIS — E7800 Pure hypercholesterolemia, unspecified: Secondary | ICD-10-CM | POA: Diagnosis present

## 2019-10-18 DIAGNOSIS — E1142 Type 2 diabetes mellitus with diabetic polyneuropathy: Secondary | ICD-10-CM | POA: Diagnosis present

## 2019-10-18 DIAGNOSIS — E785 Hyperlipidemia, unspecified: Secondary | ICD-10-CM | POA: Diagnosis present

## 2019-10-18 DIAGNOSIS — Z79899 Other long term (current) drug therapy: Secondary | ICD-10-CM | POA: Diagnosis not present

## 2019-10-18 DIAGNOSIS — E876 Hypokalemia: Secondary | ICD-10-CM | POA: Diagnosis present

## 2019-10-18 LAB — BASIC METABOLIC PANEL
Anion gap: 9 (ref 5–15)
BUN: 9 mg/dL (ref 8–23)
CO2: 25 mmol/L (ref 22–32)
Calcium: 8.8 mg/dL — ABNORMAL LOW (ref 8.9–10.3)
Chloride: 100 mmol/L (ref 98–111)
Creatinine, Ser: 0.64 mg/dL (ref 0.44–1.00)
GFR calc Af Amer: >60 mL/min (ref >60)
GFR calc non Af Amer: >60 mL/min (ref >60)
Glucose, Bld: 162 mg/dL — ABNORMAL HIGH (ref 70–99)
Potassium: 3.1 mmol/L — ABNORMAL LOW (ref 3.5–5.1)
Sodium: 134 mmol/L — ABNORMAL LOW (ref 135–145)

## 2019-10-18 LAB — CBC
HCT: 39.8 % (ref 36.0–46.0)
Hemoglobin: 12.6 g/dL (ref 12.0–15.0)
MCH: 27.4 pg (ref 26.0–34.0)
MCHC: 31.7 g/dL (ref 30.0–36.0)
MCV: 86.5 fL (ref 80.0–100.0)
Platelets: 221 10*3/uL (ref 150–400)
RBC: 4.6 MIL/uL (ref 3.87–5.11)
RDW: 15.4 % (ref 11.5–15.5)
WBC: 4.2 10*3/uL (ref 4.0–10.5)
nRBC: 0 % (ref 0.0–0.2)

## 2019-10-18 LAB — C-REACTIVE PROTEIN: CRP: 0.7 mg/dL (ref <1.0)

## 2019-10-18 LAB — MAGNESIUM: Magnesium: 2.1 mg/dL (ref 1.7–2.4)

## 2019-10-18 LAB — HEMOGLOBIN A1C
Hgb A1c MFr Bld: 7.6 % — ABNORMAL HIGH (ref 4.8–5.6)
Mean Plasma Glucose: 171.42 mg/dL

## 2019-10-18 LAB — HEPATIC FUNCTION PANEL
ALT: 48 U/L — ABNORMAL HIGH (ref 0–44)
AST: 39 U/L (ref 15–41)
Albumin: 3.9 g/dL (ref 3.5–5.0)
Alkaline Phosphatase: 64 U/L (ref 38–126)
Bilirubin, Direct: 0.1 mg/dL (ref 0.0–0.2)
Indirect Bilirubin: 0.5 mg/dL (ref 0.3–0.9)
Total Bilirubin: 0.6 mg/dL (ref 0.3–1.2)
Total Protein: 6.7 g/dL (ref 6.5–8.1)

## 2019-10-18 LAB — GLUCOSE, CAPILLARY
Glucose-Capillary: 165 mg/dL — ABNORMAL HIGH (ref 70–99)
Glucose-Capillary: 227 mg/dL — ABNORMAL HIGH (ref 70–99)
Glucose-Capillary: 191 mg/dL — ABNORMAL HIGH (ref 70–99)
Glucose-Capillary: 263 mg/dL — ABNORMAL HIGH (ref 70–99)

## 2019-10-18 LAB — URINE CULTURE: Culture: NO GROWTH

## 2019-10-18 LAB — D-DIMER, QUANTITATIVE: D-Dimer, Quant: 0.47 ug/mL-FEU (ref 0.00–0.50)

## 2019-10-18 LAB — PROCALCITONIN: Procalcitonin: <0.1 ng/mL

## 2019-10-18 MED ORDER — POTASSIUM CHLORIDE 20 MEQ PO PACK
40.0000 meq | PACK | Freq: Once | ORAL | Status: AC
  Administered 2019-10-18: 40 meq via ORAL
  Filled 2019-10-18: qty 2

## 2019-10-18 MED ORDER — DICLOFENAC SODIUM 1 % EX GEL
2.0000 g | Freq: Four times a day (QID) | CUTANEOUS | Status: DC | PRN
  Filled 2019-10-18: qty 100

## 2019-10-18 MED ORDER — CHLORHEXIDINE GLUCONATE CLOTH 2 % EX PADS
6.0000 | MEDICATED_PAD | Freq: Every day | CUTANEOUS | Status: DC
  Administered 2019-10-18 – 2019-10-21 (×4): 6 via TOPICAL

## 2019-10-18 NOTE — Progress Notes (Signed)
   10/18/19 2232  Vitals  ECG Heart Rate (!) 144 (HR 158)  Cardiac Rhythm SVT (13 bts RN Deema Juncaj notified)  MEWS Score  MEWS Temp 0  MEWS Systolic 0  MEWS Pulse 3  MEWS RR 0  MEWS LOC 0  MEWS Score 3  MEWS Score Color Yellow  MEWS Assessment  Is this an acute change? Yes  MEWS guidelines implemented *See Row Information* Yellow  Provider Notification  Provider Name/Title Jeannette Corpus  Date Provider Notified 10/18/19  Time Provider Notified 2307  Notification Type Page  Notification Reason Change in status  Response No new orders   On Call made aware

## 2019-10-18 NOTE — Progress Notes (Signed)
Report given to [place], RN, on 4th.

## 2019-10-18 NOTE — Progress Notes (Addendum)
[facility]  PROGRESS NOTE  Megan Khan [id number] DOB: Apr 19, 1956 DOA: 10/17/2019 PCP: [name], DO Admit date - 10/17/2019   Admitting Physician [name], MD  Outpatient Primary MD for the patient is [name], DO  LOS - 0 Brief Narrative   Megan Khan is a 64 y.o. year old female with medical history significant for breast cancer (last trastuzamab infusion 10/15/19), HTN, HLD,  who presented on 10/17/2019 with daughter reported changes in mentation and fever of 105-106 at home x 1 day.   In the ED, Tm ax 100.1, tachypneic ( 24-29), normal oxygen saturation and BP 164/89/  Significant labs: UA clean, initial rapid COVID test negative but repeat PCR COVID test positive. K 3.4, WBC 9. Blood cultures x 2 were obtained.  CXR with no acute cardiopulmonary disease  Admitted with working diagnosis of febrile illness of unclear etiology  Hospital course complicated by persistent fever with no clear source  Subjective  Today she complains of persistent left calf and thigh pain that she reports ongoing since increase her statin therapy dosing a few days ago  A & P   Febrile illness in patient actively undergoing chemotherapy, persistent Patient is still spiking notable fevers with max of 101 earlier this am.  She is COVID + but no hypoxia or infiltrates on CXR to suggest starting treatment. No dysuria, no localizing signs of symptoms other than left sided leg pain. Her port site looks clean.  Also considering noninfectious etiologies for fever, given leg pain could consider potential clot? --Check venous duplex of right leg given symptoms --Check procalcitonin--within normal limits --monitor blood cultures --daily cbc  COVID + No respiratory symptoms. No infiltrates on CXR. D-dimer and CRP unremarkable --procalcitonin pending --no indication for IV remdesivir or steroids --continue contact/airborne precautions  Hypokalemia Likely related to  HCTZ --replete orally -check mag  HTN Elevated on admission, but doing well at 140s/90 this am -home Toprol, amlodipine,lisinopril/HCTZ held on admission  T2DM with peripheral neuropathy, A1c 7.6 --holding home metformin --sliding scale as needed, monitor CBG --continue ome gabapentin  HLD Her lovastatin was recently increased to 40 mg a few days ago.  --on low dose pravastatin 10 mg here  Right sided breast cancer, ER/PR negative, currently undergoing Trastuzumab therapy Last Taxol treatment 09/24/2019 Received Trastuzumab on 10/15/19 Followed by Dr. Jana Hakim, will add to care team     Family Communication  :  Called per patient request son Megan Agar "Alyne Khan at [phone] and updated on care plan on 3/7  Code Status :  FULL CODE  Disposition Plan  :  Patient is from  home. Anticipated d/c date: 2 to 3 days. Barriers to d/c or necessity for inpatient status: still persistently febrile with no clear source, once no longer febrile and ensure no infection given immunosuppressed status, also ruling out non-infectious etiology of fever Consults  :  none  Procedures  :  none  DVT Prophylaxis  :  Lovenox   Lab Results  Component Value Date   PLT 221 10/18/2019    Diet :  Diet Order            Diet heart healthy/carb modified Room service appropriate? Yes; Fluid consistency: Thin  Diet effective now               Inpatient Medications Scheduled Meds: . amLODipine  10 mg Oral Daily  . Chlorhexidine Gluconate Cloth  6 each Topical Daily  . dorzolamide  2 drop Left Eye TID  .  enoxaparin (LOVENOX) injection  40 mg Subcutaneous Q24H  . gabapentin  200 mg Oral BID  . hydrochlorothiazide  25 mg Oral Daily  . insulin aspart  0-9 Units Subcutaneous TID WC  . latanoprost  1 drop Left Eye QHS  . lidocaine-prilocaine   Topical Once  . lisinopril  20 mg Oral Daily  . metoprolol succinate  50 mg Oral Daily  . pravastatin  10 mg Oral q1800   Continuous Infusions: PRN  Meds:.acetaminophen **OR** acetaminophen, ondansetron **OR** ondansetron (ZOFRAN) IV, prochlorperazine  Antibiotics  :   Anti-infectives (From admission, onward)   Start     Dose/Rate Route Frequency Ordered Stop   10/17/19 0945  ceFEPIme (MAXIPIME) 2 g in sodium chloride 0.9 % 100 mL IVPB     2 g 200 mL/hr over 30 Minutes Intravenous  Once 10/17/19 0933 10/17/19 1223   10/17/19 0945  vancomycin (VANCOREADY) IVPB 1500 mg/300 mL     1,500 mg 150 mL/hr over 120 Minutes Intravenous  Once 10/17/19 0933 10/17/19 1400       Objective   Vitals:   10/18/19 0105 10/18/19 0635 10/18/19 0749 10/18/19 1104  BP: (!) 141/98 (!) 154/94 (!) 145/91 129/88  Pulse: 84 89 92 78  Resp: 20 (!) 24 (!) 22   Temp: 99.1 F (37.3 C) (!) 101.1 F (38.4 C) 98.8 F (37.1 C) 98.2 F (36.8 C)  TempSrc: Oral Oral Oral Oral  SpO2: 94% 93% 93% 94%  Weight:      Height:        SpO2: 94 %  Wt Readings from Last 3 Encounters:  10/17/19 71.7 kg  10/15/19 71.9 kg  10/13/19 74.1 kg     Intake/Output Summary (Last 24 hours) at 10/18/2019 1131 Last data filed at 10/18/2019 1106 Gross per 24 hour  Intake 940 ml  Output 2700 ml  Net -1760 ml    Physical Exam:     Awake Alert, Oriented X 3, Normal affect Normal oral mucosa No new F.N deficits,  Normal respiratory effort on room air, CTAB RRR,No Gallops,Rubs or new Murmurs, no edema +ve B.Sounds, Abd Soft, No tenderness, No rebound, guarding or rigidity. No Cyanosis, No new Rash or bruise     I have personally reviewed the following:   Data Reviewed:  CBC Recent Labs  Lab 10/15/19 0854 10/17/19 0917 10/18/19 0328  WBC 7.8 9.0 4.2  HGB 12.7 12.2 12.6  HCT 39.2 39.0 39.8  PLT 289 243 221  MCV 85.6 88.6 86.5  MCH 27.7 27.7 27.4  MCHC 32.4 31.3 31.7  RDW 15.4 15.4 15.4  LYMPHSABS 2.5 0.6*  --   MONOABS 0.7 1.4*  --   EOSABS 0.4 0.2  --   BASOSABS 0.1 0.1  --     Chemistries  Recent Labs  Lab 10/15/19 0854 10/17/19 0917  10/18/19 0328  NA 140 134* 134*  K 3.7 3.4* 3.1*  CL 104 100 100  CO2 26 25 25   GLUCOSE 157* 218* 162*  BUN 12 11 9   CREATININE 0.80 0.73 0.64  CALCIUM 9.2 8.7* 8.8*  AST 17 45*  --   ALT 26 49*  --   ALKPHOS 88 69  --   BILITOT 0.4 0.7  --    ------------------------------------------------------------------------------------------------------------------ No results for input(s): CHOL, HDL, LDLCALC, TRIG, CHOLHDL, LDLDIRECT in the last 72 hours.  Lab Results  Component Value Date   HGBA1C 7.6 (H) 10/18/2019   ------------------------------------------------------------------------------------------------------------------ No results for input(s): TSH, T4TOTAL, T3FREE, THYROIDAB in the last  72 hours.  Invalid input(s): FREET3 ------------------------------------------------------------------------------------------------------------------ No results for input(s): VITAMINB12, FOLATE, FERRITIN, TIBC, IRON, RETICCTPCT in the last 72 hours.  Coagulation profile Recent Labs  Lab 10/17/19 0917  INR 1.0    Recent Labs    10/18/19 1007  DDIMER 0.47    Cardiac Enzymes No results for input(s): CKMB, TROPONINI, MYOGLOBIN in the last 168 hours.  Invalid input(s): CK ------------------------------------------------------------------------------------------------------------------ No results found for: BNP  Micro Results Recent Results (from the past 240 hour(s))  Blood Culture (routine x 2)     Status: None (Preliminary result)   Collection Time: 10/17/19  9:17 AM   Specimen: Porta Cath; Blood  Result Value Ref Range Status   Specimen Description   Final    PORTA CATH Performed at [facility], [place] [address], [place], [place] [address]    Special Requests   Final    BOTTLES DRAWN AEROBIC AND ANAEROBIC Blood Culture adequate volume Performed at [facility] [address], [place], [place] [address]    Culture   Final    NO  GROWTH < 24 HOURS Performed at [facility] [address], [place], [place] [address]    Report Status PENDING  Incomplete  SARS CORONAVIRUS 2 (TAT 6-24 HRS) Nasopharyngeal Nasopharyngeal Swab     Status: Abnormal   Collection Time: 10/17/19  9:19 AM   Specimen: Nasopharyngeal Swab  Result Value Ref Range Status   SARS Coronavirus 2 POSITIVE (A) NEGATIVE Final    Comment: RESULT CALLED TO, READ BACK BY AND VERIFIED WITH: PAULA ARMSTRONG RN.@[email or web address]  ON 3.6.21 BY TCALDWELL MT. (NOTE) SARS-CoV-2 target nucleic acids are DETECTED. The SARS-CoV-2 RNA is generally detectable in upper and lower respiratory specimens during the acute phase of infection. Positive results are indicative of the presence of SARS-CoV-2 RNA. Clinical correlation with patient history and other diagnostic information is  necessary to determine patient infection status. Positive results do not rule out bacterial infection or co-infection with other viruses.  The expected result is Negative. Fact Sheet for Patients: SugarRoll.be Fact Sheet for Healthcare Providers: https://www.woods-mathews.com/ This test is not yet approved or cleared by the [place] FDA and  has been authorized for detection and/or diagnosis of SARS-CoV-2 by FDA under an Emergency Use Authorization (EUA). This EUA will remain  in effect (meaning this test can  be used) for the duration of the COVID-19 declaration under Section 564(b)(1) of the Act, 21 U.S.C. section 360bbb-3(b)(1), unless the authorization is terminated or revoked sooner. Performed at [facility], [place] [address], [place], [place] [address]   Blood Culture (routine x 2)     Status: None (Preliminary result)   Collection Time: 10/17/19  9:22 AM   Specimen: Left Antecubital; Blood  Result Value Ref Range Status   Specimen Description   Final    LEFT ANTECUBITAL Performed at [facility] [address] [place], [place] [address]    Special Requests   Final    BOTTLES DRAWN AEROBIC AND ANAEROBIC Blood Culture adequate volume Performed at [facility] [address], [place], [place] [address]    Culture   Final    NO GROWTH < 24 HOURS Performed at [facility] [address] [place], [place] [address]    Report Status PENDING  Incomplete    Radiology Reports DG Chest Port 1 View  Result Date: 10/17/2019 CLINICAL DATA:  64 year old female with history of fever. EXAM: PORTABLE CHEST  1 VIEW COMPARISON:  Chest x-ray 05/29/2019. FINDINGS: Right subclavian single-lumen porta cath with tip terminating at the superior cavoatrial junction. Lung volumes are normal. No consolidative airspace disease. No pleural effusions. No pneumothorax. No pulmonary nodule or mass noted. Pulmonary vasculature and the cardiomediastinal silhouette are within normal limits. Atherosclerosis in the thoracic aorta. IMPRESSION: 1. No radiographic evidence of acute cardiopulmonary disease. 2. Aortic atherosclerosis. Electronically Signed   By: Vinnie Langton M.D.   On: 10/17/2019 10:15     Time Spent in minutes  30     Desiree Hane M.D on 10/18/2019 at 11:31 AM  To page go to [email or web address] - password [facility]

## 2019-10-18 NOTE — Progress Notes (Signed)
Pt transferred to 1422 via bed accompanied by RN and NT.

## 2019-10-18 NOTE — Progress Notes (Signed)
[facility] lab called +COVID PCR. Charge RN aware. MD paged to be transferred to 4W. Bed requested. Pt let know she was +. Accepted well. Reassurance given.

## 2019-10-19 ENCOUNTER — Inpatient Hospital Stay (HOSPITAL_COMMUNITY): Payer: Medicare Other

## 2019-10-19 DIAGNOSIS — R52 Pain, unspecified: Secondary | ICD-10-CM

## 2019-10-19 LAB — BASIC METABOLIC PANEL
Anion gap: 10 (ref 5–15)
BUN: 16 mg/dL (ref 8–23)
CO2: 22 mmol/L (ref 22–32)
Calcium: 8.8 mg/dL — ABNORMAL LOW (ref 8.9–10.3)
Chloride: 101 mmol/L (ref 98–111)
Creatinine, Ser: 0.67 mg/dL (ref 0.44–1.00)
GFR calc Af Amer: >60 mL/min (ref >60)
GFR calc non Af Amer: >60 mL/min (ref >60)
Glucose, Bld: 223 mg/dL — ABNORMAL HIGH (ref 70–99)
Potassium: 3.6 mmol/L (ref 3.5–5.1)
Sodium: 133 mmol/L — ABNORMAL LOW (ref 135–145)

## 2019-10-19 LAB — GLUCOSE, CAPILLARY
Glucose-Capillary: 203 mg/dL — ABNORMAL HIGH (ref 70–99)
Glucose-Capillary: 208 mg/dL — ABNORMAL HIGH (ref 70–99)
Glucose-Capillary: 201 mg/dL — ABNORMAL HIGH (ref 70–99)
Glucose-Capillary: 312 mg/dL — ABNORMAL HIGH (ref 70–99)

## 2019-10-19 LAB — PROCALCITONIN: Procalcitonin: <0.1 ng/mL

## 2019-10-19 MED ORDER — SODIUM CHLORIDE 0.9% FLUSH
10.0000 mL | Freq: Two times a day (BID) | INTRAVENOUS | Status: DC
  Administered 2019-10-19 (×2): 10 mL

## 2019-10-19 MED ORDER — SODIUM CHLORIDE 0.9% FLUSH: 10.0000 mL | INTRAVENOUS | Status: DC | PRN

## 2019-10-19 MED ORDER — INSULIN ASPART 100 UNIT/ML ~~LOC~~ SOLN
5.0000 [IU] | Freq: Once | SUBCUTANEOUS | Status: AC
  Administered 2019-10-19: 5 [IU] via SUBCUTANEOUS

## 2019-10-19 NOTE — Progress Notes (Signed)
Bilateral lower extremity venous duplex exam completed.  Preliminary results can be found under CV proc under chart review.  10/19/2019 9:49 AM  [name], K., RDMS, RVT

## 2019-10-19 NOTE — Progress Notes (Signed)
[facility]  PROGRESS NOTE  DENYLAH PELTER [id number] DOB: 20-May-1956 DOA: 10/17/2019 PCP: [name], DO Admit date - 10/17/2019   Admitting Physician [name], MD  Outpatient Primary MD for the patient is [name], DO  LOS - 1 Brief Narrative   Megan Khan is a 64 y.o. year old female with medical history significant for breast cancer (last trastuzamab infusion 10/15/19), HTN, HLD,  who presented on 10/17/2019 with daughter reported changes in mentation and fever of 105-106 at home x 1 day.   In the ED, Tm ax 100.1, tachypneic ( 24-29), normal oxygen saturation and BP 164/89/  Significant labs: UA clean, initial rapid COVID test negative but repeat PCR COVID test positive. K 3.4, WBC 9. Blood cultures x 2 were obtained.  CXR with no acute cardiopulmonary disease  Admitted with working diagnosis of febrile illness of unclear etiology  Hospital course complicated by persistent fever with no clear source  Subjective  Today she is having some minor right-sided leg pain.  She is responding to medicine started yesterday.  Denies any fevers, mild nonproductive cough, no shortness of breath, no diarrhea, no urinary symptoms.  A & P   Febrile illness in patient actively undergoing chemotherapy, stable Afebrile overnight.  Suspect noninfectious source given procalcitonin trend negative..She is COVID + but no hypoxia or infiltrates on CXR to suggest starting treatment. No dysuria, no localizing signs of symptoms other than left sided leg pain. Her port site looks clean.  --Check venous duplex bilateral lower extremities preliminary read as negative --If blood cultures remain negative and patient does not have recurrent fevers anticipate discharge in 24 hours. --Daily CBC -No current indication for antibiotics  COVID + No respiratory symptoms. No infiltrates on CXR. D-dimer and CRP unremarkable --procalcitonin trend x2 less than 0.10 --no indication for IV  remdesivir or steroids --continue contact/airborne precautions  Hypokalemia Likely related to HCTZ.  Mag within normal limits --replete orally, repeat BMP  HTN, stable at goal Elevated on admission, but doing well 120s over 80s -home Toprol, amlodipine,lisinopril/HCTZ held on admission  T2DM with peripheral neuropathy, A1c 7.6 Fasting blood glucose this a.m. in low 200s --holding home metformin --sliding scale as needed, monitor CBG --continue home gabapentin  HLD Her lovastatin was recently increased to 40 mg a few days ago.  --We will DC statin therapy, given persistent leg pain  Right sided breast cancer, ER/PR negative, currently undergoing Trastuzumab therapy Last Taxol treatment 09/24/2019 Received Trastuzumab on 10/15/19 Followed by Dr. Jana Hakim, will add to care team     Family Communication  :  Called per patient request son Megan Khan "Megan Khan at [phone] and updated on care plan on 3/7, will call again on 3/8  Code Status :  FULL CODE  Disposition Plan  :  Patient is from  home. Anticipated d/c date:  24 to 48 hours. Barriers to d/c or necessity for inpatient status: If blood cultures remain negative and patient does not have recurrent fevers anticipate discharge in 24 hours. given immunosuppressed status,  Consults  :  none  Procedures  : Venous duplex bilateral lower extremities, 3/8  DVT Prophylaxis  :  Lovenox   Lab Results  Component Value Date   PLT 221 10/18/2019    Diet :  Diet Order            Diet heart healthy/carb modified Room service appropriate? Yes; Fluid consistency: Thin  Diet effective now  Inpatient Medications Scheduled Meds: . amLODipine  10 mg Oral Daily  . Chlorhexidine Gluconate Cloth  6 each Topical Daily  . dorzolamide  2 drop Left Eye TID  . enoxaparin (LOVENOX) injection  40 mg Subcutaneous Q24H  . gabapentin  200 mg Oral BID  . hydrochlorothiazide  25 mg Oral Daily  . insulin aspart  0-9 Units  Subcutaneous TID WC  . latanoprost  1 drop Left Eye QHS  . lidocaine-prilocaine   Topical Once  . lisinopril  20 mg Oral Daily  . metoprolol succinate  50 mg Oral Daily  . pravastatin  10 mg Oral q1800  . sodium chloride flush  10-40 mL Intracatheter Q12H   Continuous Infusions: PRN Meds:.acetaminophen **OR** acetaminophen, diclofenac Sodium, ondansetron **OR** ondansetron (ZOFRAN) IV, prochlorperazine, sodium chloride flush  Antibiotics  :   Anti-infectives (From admission, onward)   Start     Dose/Rate Route Frequency Ordered Stop   10/17/19 0945  ceFEPIme (MAXIPIME) 2 g in sodium chloride 0.9 % 100 mL IVPB     2 g 200 mL/hr over 30 Minutes Intravenous  Once 10/17/19 0933 10/17/19 1223   10/17/19 0945  vancomycin (VANCOREADY) IVPB 1500 mg/300 mL     1,500 mg 150 mL/hr over 120 Minutes Intravenous  Once 10/17/19 0933 10/17/19 1400       Objective   Vitals:   10/18/19 2104 10/19/19 0638 10/19/19 1005 10/19/19 1218  BP: (!) 135/91 (!) 152/94 (!) 130/91 127/81  Pulse: 79 88 91 77  Resp: 20 20    Temp: 98.1 F (36.7 C) 99.8 F (37.7 C)  98.4 F (36.9 C)  TempSrc: Oral Oral  Oral  SpO2: 99% 93%  92%  Weight:      Height:        SpO2: 92 %  Wt Readings from Last 3 Encounters:  10/17/19 71.7 kg  10/15/19 71.9 kg  10/13/19 74.1 kg     Intake/Output Summary (Last 24 hours) at 10/19/2019 1359 Last data filed at 10/19/2019 0658 Gross per 24 hour  Intake --  Output 800 ml  Net -800 ml    Physical Exam:     Awake Alert, Oriented X 3, Normal affect Normal oral mucosa No new F.N deficits,  Normal respiratory effort on room air, CTAB RRR,No Gallops,Rubs or new Murmurs, no edema +ve B.Sounds, Abd Soft, No tenderness, No rebound, guarding or rigidity. No Cyanosis, No new Rash or bruise     I have personally reviewed the following:   Data Reviewed:  CBC Recent Labs  Lab 10/15/19 0854 10/17/19 0917 10/18/19 0328  WBC 7.8 9.0 4.2  HGB 12.7 12.2 12.6  HCT  39.2 39.0 39.8  PLT 289 243 221  MCV 85.6 88.6 86.5  MCH 27.7 27.7 27.4  MCHC 32.4 31.3 31.7  RDW 15.4 15.4 15.4  LYMPHSABS 2.5 0.6*  --   MONOABS 0.7 1.4*  --   EOSABS 0.4 0.2  --   BASOSABS 0.1 0.1  --     Chemistries  Recent Labs  Lab 10/15/19 0854 10/17/19 0917 10/18/19 0328 10/18/19 1007 10/18/19 1153  NA 140 134* 134*  --   --   K 3.7 3.4* 3.1*  --   --   CL 104 100 100  --   --   CO2 26 25 25   --   --   GLUCOSE 157* 218* 162*  --   --   BUN 12 11 9   --   --   CREATININE 0.80  0.73 0.64  --   --   CALCIUM 9.2 8.7* 8.8*  --   --   MG  --   --   --   --  2.1  AST 17 45*  --  39  --   ALT 26 49*  --  48*  --   ALKPHOS 88 69  --  64  --   BILITOT 0.4 0.7  --  0.6  --    ------------------------------------------------------------------------------------------------------------------ No results for input(s): CHOL, HDL, LDLCALC, TRIG, CHOLHDL, LDLDIRECT in the last 72 hours.  Lab Results  Component Value Date   HGBA1C 7.6 (H) 10/18/2019   ------------------------------------------------------------------------------------------------------------------ No results for input(s): TSH, T4TOTAL, T3FREE, THYROIDAB in the last 72 hours.  Invalid input(s): FREET3 ------------------------------------------------------------------------------------------------------------------ No results for input(s): VITAMINB12, FOLATE, FERRITIN, TIBC, IRON, RETICCTPCT in the last 72 hours.  Coagulation profile Recent Labs  Lab 10/17/19 0917  INR 1.0    Recent Labs    10/18/19 1007  DDIMER 0.47    Cardiac Enzymes No results for input(s): CKMB, TROPONINI, MYOGLOBIN in the last 168 hours.  Invalid input(s): CK ------------------------------------------------------------------------------------------------------------------ No results found for: BNP  Micro Results Recent Results (from the past 240 hour(s))  Blood Culture (routine x 2)     Status: None (Preliminary result)    Collection Time: 10/17/19  9:17 AM   Specimen: Porta Cath; Blood  Result Value Ref Range Status   Specimen Description   Final    PORTA CATH Performed at [facility], [place] [address] [place], [place] [address]    Special Requests   Final    BOTTLES DRAWN AEROBIC AND ANAEROBIC Blood Culture adequate volume Performed at [place] [address] [place], [place] [address]    Culture   Final    NO GROWTH 2 DAYS Performed at [place] [address], [place], [place] [address]    Report Status PENDING  Incomplete  SARS CORONAVIRUS 2 (TAT 6-24 HRS) Nasopharyngeal Nasopharyngeal Swab     Status: Abnormal   Collection Time: 10/17/19  9:19 AM   Specimen: Nasopharyngeal Swab  Result Value Ref Range Status   SARS Coronavirus 2 POSITIVE (A) NEGATIVE Final    Comment: RESULT CALLED TO, READ BACK BY AND VERIFIED WITH: PAULA ARMSTRONG RN.@[email or web address]  ON 3.6.21 BY TCALDWELL MT. (NOTE) SARS-CoV-2 target nucleic acids are DETECTED. The SARS-CoV-2 RNA is generally detectable in upper and lower respiratory specimens during the acute phase of infection. Positive results are indicative of the presence of SARS-CoV-2 RNA. Clinical correlation with patient history and other diagnostic information is  necessary to determine patient infection status. Positive results do not rule out bacterial infection or co-infection with other viruses.  The expected result is Negative. Fact Sheet for Patients: SugarRoll.be Fact Sheet for Healthcare Providers: https://www.woods-mathews.com/ This test is not yet approved or cleared by the [place] FDA and  has been authorized for detection and/or diagnosis of SARS-CoV-2 by FDA under an Emergency Use Authorization (EUA). This EUA will remain  in effect (meaning this test can  be used) for the duration of the COVID-19 declaration under Section 564(b)(1) of the Act, 21  U.S.C. section 360bbb-3(b)(1), unless the authorization is terminated or revoked sooner. Performed at [facility], [place] [address] [place], [place] [address]   Blood Culture (routine x 2)     Status: None (Preliminary result)   Collection Time: 10/17/19  9:22 AM   Specimen: Left Antecubital; Blood  Result Value Ref  Range Status   Specimen Description   Final    LEFT ANTECUBITAL Performed at [place], [place] [address] [place], [place] [address]    Special Requests   Final    BOTTLES DRAWN AEROBIC AND ANAEROBIC Blood Culture adequate volume Performed at [place] [address], [place], [place] [address]    Culture   Final    NO GROWTH 2 DAYS Performed at [facility] [address] [place], [place] [address]    Report Status PENDING  Incomplete  Urine culture     Status: None   Collection Time: 10/17/19 11:13 AM   Specimen: In/Out Cath Urine  Result Value Ref Range Status   Specimen Description   Final    IN/OUT CATH URINE Performed at [place] [address], [place], [place] [address]    Special Requests   Final    NONE Performed at [facility], [place] [address], [place], [place] [address]    Culture   Final    NO GROWTH Performed at [facility], [place] [address] [place], [place] [address]    Report Status 10/18/2019 FINAL  Final    Radiology Reports DG Chest Port 1 View  Result Date: 10/17/2019 CLINICAL DATA:  64 year old female with history of fever. EXAM: PORTABLE CHEST 1 VIEW COMPARISON:  Chest x-ray 05/29/2019. FINDINGS: Right subclavian single-lumen porta cath with tip terminating at the superior cavoatrial junction. Lung volumes are normal. No consolidative airspace disease. No pleural effusions. No pneumothorax. No pulmonary nodule or mass noted. Pulmonary vasculature and the cardiomediastinal silhouette are within normal limits. Atherosclerosis in the  thoracic aorta. IMPRESSION: 1. No radiographic evidence of acute cardiopulmonary disease. 2. Aortic atherosclerosis. Electronically Signed   By: Vinnie Langton M.D.   On: 10/17/2019 10:15   VAS [place] LOWER EXTREMITY VENOUS (DVT)  Result Date: 10/19/2019  Lower Venous DVTStudy Indications: Pain. Other Indications: Covid+. Risk Factors: Cancer Breast. Anticoagulation: Lovenox. Comparison Study: No prior exam. Performing Technologist: Baldwin Crown ARDMS, RVT  Examination Guidelines: A complete evaluation includes B-mode imaging, spectral Doppler, color Doppler, and power Doppler as needed of all accessible portions of each vessel. Bilateral testing is considered an integral part of a complete examination. Limited examinations for reoccurring indications may be performed as noted. The reflux portion of the exam is performed with the patient in reverse Trendelenburg.  +---------+---------------+---------+-----------+----------+--------------+ RIGHT    CompressibilityPhasicitySpontaneityPropertiesThrombus Aging +---------+---------------+---------+-----------+----------+--------------+ CFV      Full           Yes      Yes                                 +---------+---------------+---------+-----------+----------+--------------+ SFJ      Full                                                        +---------+---------------+---------+-----------+----------+--------------+ FV Prox  Full                                                        +---------+---------------+---------+-----------+----------+--------------+  FV Mid   Full                                                        +---------+---------------+---------+-----------+----------+--------------+ FV DistalFull                                                        +---------+---------------+---------+-----------+----------+--------------+ PFV      Full                                                         +---------+---------------+---------+-----------+----------+--------------+ POP      Full           Yes      Yes                                 +---------+---------------+---------+-----------+----------+--------------+ PTV      Full                                                        +---------+---------------+---------+-----------+----------+--------------+ PERO     Full                                                        +---------+---------------+---------+-----------+----------+--------------+   +---------+---------------+---------+-----------+----------+--------------+ LEFT     CompressibilityPhasicitySpontaneityPropertiesThrombus Aging +---------+---------------+---------+-----------+----------+--------------+ CFV      Full           Yes      Yes                                 +---------+---------------+---------+-----------+----------+--------------+ SFJ      Full                                                        +---------+---------------+---------+-----------+----------+--------------+ FV Prox  Full                                                        +---------+---------------+---------+-----------+----------+--------------+ FV Mid   Full                                                        +---------+---------------+---------+-----------+----------+--------------+  FV DistalFull                                                        +---------+---------------+---------+-----------+----------+--------------+ PFV      Full                                                        +---------+---------------+---------+-----------+----------+--------------+ POP      Full           Yes      Yes                                 +---------+---------------+---------+-----------+----------+--------------+ PTV      Full                                                         +---------+---------------+---------+-----------+----------+--------------+ PERO     Full                                                        +---------+---------------+---------+-----------+----------+--------------+     Summary: BILATERAL: - No evidence of deep vein thrombosis seen in the lower extremities, bilaterally.  RIGHT: - No cystic structure found in the popliteal fossa.  LEFT: - No cystic structure found in the popliteal fossa.  *See table(s) above for measurements and observations.    Preliminary      Time Spent in minutes  30     Desiree Hane M.D on 10/19/2019 at 1:59 PM  To page go to [email or web address] - password [email or web address]

## 2019-10-19 NOTE — Progress Notes (Signed)
Inpatient Diabetes Program Recommendations  AACE/ADA: New Consensus Statement on Inpatient Glycemic Control (2015)  Target Ranges:  Prepandial:   less than 140 mg/dL      Peak postprandial:   less than 180 mg/dL (1-2 hours)      Critically ill patients:  140 - 180 mg/dL   Lab Results  Component Value Date   GLUCAP 203 (H) 10/19/2019   HGBA1C 7.6 (H) 10/18/2019    Review of Glycemic Control Results for Megan Khan, Megan Khan (MRN [id number]) as of 10/19/2019 10:44  Ref. Range 10/18/2019 07:45 10/18/2019 11:02 10/18/2019 16:21 10/18/2019 21:02 10/19/2019 09:05  Glucose-Capillary Latest Ref Range: 70 - 99 mg/dL 165 (H) 227 (H) 191 (H) 263 (H) 203 (H)   Diabetes history: DM 2 Outpatient Diabetes medications:  Metformin 1000 mg bid Current orders for Inpatient glycemic control:  Novolog sensitive tid with meals   Inpatient Diabetes Program Recommendations:    If appropriate, while in the hospital, add Lantus 10 units daily.   Thanks  Adah Perl, RN, BC-ADM Inpatient Diabetes Coordinator Pager [phone] (8a-5p)

## 2019-10-20 ENCOUNTER — Other Ambulatory Visit: Payer: Self-pay | Admitting: Oncology

## 2019-10-20 DIAGNOSIS — R509 Fever, unspecified: Principal | ICD-10-CM

## 2019-10-20 DIAGNOSIS — D709 Neutropenia, unspecified: Secondary | ICD-10-CM | POA: Diagnosis not present

## 2019-10-20 LAB — CBC
HCT: 40.3 % (ref 36.0–46.0)
Hemoglobin: 12.7 g/dL (ref 12.0–15.0)
MCH: 27.3 pg (ref 26.0–34.0)
MCHC: 31.5 g/dL (ref 30.0–36.0)
MCV: 86.5 fL (ref 80.0–100.0)
Platelets: 195 10*3/uL (ref 150–400)
RBC: 4.66 MIL/uL (ref 3.87–5.11)
RDW: 15.4 % (ref 11.5–15.5)
WBC: 2.6 10*3/uL — ABNORMAL LOW (ref 4.0–10.5)
nRBC: 0 % (ref 0.0–0.2)

## 2019-10-20 LAB — DIFFERENTIAL
Abs Immature Granulocytes: 0 10*3/uL (ref 0.00–0.07)
Basophils Absolute: 0 10*3/uL (ref 0.0–0.1)
Basophils Relative: 2 %
Eosinophils Absolute: 0.1 10*3/uL (ref 0.0–0.5)
Eosinophils Relative: 3 %
Immature Granulocytes: 0 %
Lymphocytes Relative: 47 %
Lymphs Abs: 1.3 10*3/uL (ref 0.7–4.0)
Monocytes Absolute: 0.5 10*3/uL (ref 0.1–1.0)
Monocytes Relative: 18 %
Neutro Abs: 0.8 10*3/uL — ABNORMAL LOW (ref 1.7–7.7)
Neutrophils Relative %: 30 %

## 2019-10-20 LAB — GLUCOSE, CAPILLARY
Glucose-Capillary: 208 mg/dL — ABNORMAL HIGH (ref 70–99)
Glucose-Capillary: 236 mg/dL — ABNORMAL HIGH (ref 70–99)
Glucose-Capillary: 170 mg/dL — ABNORMAL HIGH (ref 70–99)
Glucose-Capillary: 281 mg/dL — ABNORMAL HIGH (ref 70–99)

## 2019-10-20 MED ORDER — MUPIROCIN 2 % EX OINT
1.0000 "application " | TOPICAL_OINTMENT | Freq: Two times a day (BID) | CUTANEOUS | Status: DC
  Administered 2019-10-20 – 2019-10-21 (×3): 1 via NASAL
  Filled 2019-10-20 (×2): qty 22

## 2019-10-20 MED ORDER — INSULIN GLARGINE 100 UNIT/ML ~~LOC~~ SOLN
10.0000 [IU] | Freq: Every day | SUBCUTANEOUS | Status: DC
  Administered 2019-10-20 – 2019-10-21 (×2): 10 [IU] via SUBCUTANEOUS
  Filled 2019-10-20 (×2): qty 0.1

## 2019-10-20 NOTE — Progress Notes (Signed)
[facility]  PROGRESS NOTE  Megan Khan [id number] DOB: 17-Jun-1956 DOA: 10/17/2019 PCP: [name], DO Admit date - 10/17/2019   Admitting Physician [name], MD  Outpatient Primary MD for the patient is [name], DO  LOS - 2 Brief Narrative   Megan Khan is a 64 y.o. year old female with medical history significant for breast cancer (last trastuzamab infusion 10/15/19), HTN, HLD,  who presented on 10/17/2019 with daughter reported changes in mentation and fever of 105-106 at home x 1 day.   In the ED, Tm ax 100.1, tachypneic ( 24-29), normal oxygen saturation and BP 164/89/  Significant labs: UA clean, initial rapid COVID test negative but repeat PCR COVID test positive. K 3.4, WBC 9. Blood cultures x 2 were obtained.  CXR with no acute cardiopulmonary disease  Admitted with working diagnosis of febrile illness of unclear etiology  Hospital course complicated by persistent fever with no clear source  Subjective  She has no complaints and wants to go home  Denies any fevers, mild nonproductive cough, no shortness of breath, no diarrhea, no urinary symptoms.  A & P   Febrile illness in patient actively undergoing chemotherapy, stable Afebrile overnight.  Suspect noninfectious source given procalcitonin trend negative..She is COVID + but no hypoxia or infiltrates on CXR to suggest starting treatment. No dysuria, no localizing signs of symptoms other than left sided leg pain for which DVT was considered but venous u/s negative. Her port site looks clean. Venous duplex negative  --If blood cultures remain negative and patient does not have recurrent fevers anticipate discharge in 24 hours. --Daily CBC -No current indication for antibiotics  Leukopenia with moderate neutropenia, new On admission WBC was wnl at baseline but today ANC now 800 and Tmax of 99 overnight. Blood cultures x2 unremarkable. No fever ( >100.4) for > 24 hours. --consulted  oncology(followed by Dr. Jana Hakim --if febrile will start empiric antibiotics given neutropenia --will need close monitoring inpatient given acute change in white count  COVID + No respiratory symptoms. No infiltrates on CXR. D-dimer and CRP unremarkable --procalcitonin trend x2 less than 0.10 --no indication for IV remdesivir or steroids --continue contact/airborne precautions  Hypokalemia Likely related to HCTZ.  Mag within normal limits --replete orally, repeat BMP  HTN, stable at goal Elevated on admission, but doing well 120s over 80s -home Toprol, amlodipine,lisinopril/HCTZ held on admission  T2DM with peripheral neuropathy, A1c 7.6 Fasting blood glucose this a.m. in 200s --holding home metformin --start Lantus 10 U --sliding scale as needed, monitor CBG --continue home gabapentin  HLD Her lovastatin was recently increased to 40 mg a few days ago.  --We will DC statin therapy, given persistent leg pain  Right sided breast cancer, ER/PR negative, currently undergoing Trastuzumab therapy Last Taxol treatment 09/24/2019 Received Trastuzumab on 10/15/19 Followed by Dr. Gwenyth Bender given new onset neutropenia    Family Communication  :  Called per patient request son [name] "Aoife Lamprecht at [phone] and updated on care plan on 3/7, will call again on 3/8  Code Status :  FULL CODE  Disposition Plan  :  Patient is from  home. Anticipated d/c date:  24 to 48 hours. Barriers to d/c or necessity for inpatient status: If blood cultures remain negative and patient does not have recurrent fevers and neutrophil count improves anticipate discharge in 24 hours. given immunosuppressed status,  Consults  :  none  Procedures  : Venous duplex bilateral lower extremities, 3/8  DVT Prophylaxis  :  Lovenox   Lab Results  Component Value Date   PLT 195 10/20/2019    Diet :  Diet Order            Diet heart healthy/carb modified Room service appropriate? Yes; Fluid  consistency: Thin  Diet effective now               Inpatient Medications Scheduled Meds: . amLODipine  10 mg Oral Daily  . Chlorhexidine Gluconate Cloth  6 each Topical Daily  . dorzolamide  2 drop Left Eye TID  . enoxaparin (LOVENOX) injection  40 mg Subcutaneous Q24H  . gabapentin  200 mg Oral BID  . hydrochlorothiazide  25 mg Oral Daily  . insulin aspart  0-9 Units Subcutaneous TID WC  . latanoprost  1 drop Left Eye QHS  . lidocaine-prilocaine   Topical Once  . lisinopril  20 mg Oral Daily  . metoprolol succinate  50 mg Oral Daily  . mupirocin ointment  1 application Nasal BID  . sodium chloride flush  10-40 mL Intracatheter Q12H   Continuous Infusions: PRN Meds:.acetaminophen **OR** acetaminophen, diclofenac Sodium, ondansetron **OR** ondansetron (ZOFRAN) IV, prochlorperazine, sodium chloride flush  Antibiotics  :   Anti-infectives (From admission, onward)   Start     Dose/Rate Route Frequency Ordered Stop   10/17/19 0945  ceFEPIme (MAXIPIME) 2 g in sodium chloride 0.9 % 100 mL IVPB     2 g 200 mL/hr over 30 Minutes Intravenous  Once 10/17/19 0933 10/17/19 1223   10/17/19 0945  vancomycin (VANCOREADY) IVPB 1500 mg/300 mL     1,500 mg 150 mL/hr over 120 Minutes Intravenous  Once 10/17/19 0933 10/17/19 1400       Objective   Vitals:   10/19/19 1005 10/19/19 1218 10/19/19 2059 10/20/19 0602  BP: (!) 130/91 127/81 119/83 (!) 133/93  Pulse: 91 77 85 86  Resp:    18  Temp:  98.4 F (36.9 C) 98 F (36.7 C) 99 F (37.2 C)  TempSrc:  Oral Oral Oral  SpO2:  92% 96% 94%  Weight:      Height:        SpO2: 94 %  Wt Readings from Last 3 Encounters:  10/17/19 71.7 kg  10/15/19 71.9 kg  10/13/19 74.1 kg     Intake/Output Summary (Last 24 hours) at 10/20/2019 1407 Last data filed at 10/20/2019 0500 Gross per 24 hour  Intake 560 ml  Output 400 ml  Net 160 ml    Physical Exam:     Awake Alert, Oriented X 3, Normal affect Normal oral mucosa No new F.N  deficits,  Normal respiratory effort on room air, CTAB RRR,No Gallops,Rubs or new Murmurs, no edema +ve B.Sounds, Abd Soft, No tenderness, No rebound, guarding or rigidity. No Cyanosis, No new Rash or bruise     I have personally reviewed the following:   Data Reviewed:  CBC Recent Labs  Lab 10/15/19 0854 10/17/19 0917 10/18/19 0328 10/20/19 0500  WBC 7.8 9.0 4.2 2.6*  HGB 12.7 12.2 12.6 12.7  HCT 39.2 39.0 39.8 40.3  PLT 289 243 221 195  MCV 85.6 88.6 86.5 86.5  MCH 27.7 27.7 27.4 27.3  MCHC 32.4 31.3 31.7 31.5  RDW 15.4 15.4 15.4 15.4  LYMPHSABS 2.5 0.6*  --  1.3  MONOABS 0.7 1.4*  --  0.5  EOSABS 0.4 0.2  --  0.1  BASOSABS 0.1 0.1  --  0.0    Chemistries  Recent Labs  Lab 10/15/19 0854  10/17/19 0917 10/18/19 0328 10/18/19 1007 10/18/19 1153 10/19/19 0354  NA 140 134* 134*  --   --  133*  K 3.7 3.4* 3.1*  --   --  3.6  CL 104 100 100  --   --  101  CO2 26 25 25   --   --  22  GLUCOSE 157* 218* 162*  --   --  223*  BUN 12 11 9   --   --  16  CREATININE 0.80 0.73 0.64  --   --  0.67  CALCIUM 9.2 8.7* 8.8*  --   --  8.8*  MG  --   --   --   --  2.1  --   AST 17 45*  --  39  --   --   ALT 26 49*  --  48*  --   --   ALKPHOS 88 69  --  64  --   --   BILITOT 0.4 0.7  --  0.6  --   --    ------------------------------------------------------------------------------------------------------------------ No results for input(s): CHOL, HDL, LDLCALC, TRIG, CHOLHDL, LDLDIRECT in the last 72 hours.  Lab Results  Component Value Date   HGBA1C 7.6 (H) 10/18/2019   ------------------------------------------------------------------------------------------------------------------ No results for input(s): TSH, T4TOTAL, T3FREE, THYROIDAB in the last 72 hours.  Invalid input(s): FREET3 ------------------------------------------------------------------------------------------------------------------ No results for input(s): VITAMINB12, FOLATE, FERRITIN, TIBC, IRON,  RETICCTPCT in the last 72 hours.  Coagulation profile Recent Labs  Lab 10/17/19 0917  INR 1.0    Recent Labs    10/18/19 1007  DDIMER 0.47    Cardiac Enzymes No results for input(s): CKMB, TROPONINI, MYOGLOBIN in the last 168 hours.  Invalid input(s): CK ------------------------------------------------------------------------------------------------------------------ No results found for: BNP  Micro Results Recent Results (from the past 240 hour(s))  Blood Culture (routine x 2)     Status: None (Preliminary result)   Collection Time: 10/17/19  9:17 AM   Specimen: Porta Cath; Blood  Result Value Ref Range Status   Specimen Description   Final    PORTA CATH Performed at [facility], [place] [address] [place], [place] [address]    Special Requests   Final    BOTTLES DRAWN AEROBIC AND ANAEROBIC Blood Culture adequate volume Performed at [facility] [address] [place], [place] [address]    Culture   Final    NO GROWTH 3 DAYS Performed at [facility], [place] [address], [place], [place] [address]    Report Status PENDING  Incomplete  SARS CORONAVIRUS 2 (TAT 6-24 HRS) Nasopharyngeal Nasopharyngeal Swab     Status: Abnormal   Collection Time: 10/17/19  9:19 AM   Specimen: Nasopharyngeal Swab  Result Value Ref Range Status   SARS Coronavirus 2 POSITIVE (A) NEGATIVE Final    Comment: RESULT CALLED TO, READ BACK BY AND VERIFIED WITH: PAULA ARMSTRONG RN.@[email or web address]  ON 3.6.21 BY TCALDWELL MT. (NOTE) SARS-CoV-2 target nucleic acids are DETECTED. The SARS-CoV-2 RNA is generally detectable in upper and lower respiratory specimens during the acute phase of infection. Positive results are indicative of the presence of SARS-CoV-2 RNA. Clinical correlation with patient history and other diagnostic information is  necessary to determine patient infection status. Positive results do not rule out bacterial infection or co-infection with other  viruses.  The expected result is Negative. Fact Sheet for Patients: SugarRoll.be Fact Sheet for Healthcare Providers: https://www.woods-mathews.com/ This test is not yet approved or cleared by the [place] FDA and  has been authorized for detection and/or diagnosis of SARS-CoV-2 by FDA under an Emergency Use Authorization (EUA). This EUA will remain  in effect (meaning this test can  be used) for the duration of the COVID-19 declaration under Section 564(b)(1) of the Act, 21 U.S.C. section 360bbb-3(b)(1), unless the authorization is terminated or revoked sooner. Performed at [facility], [place] [address] [place], [place] [address]   Blood Culture (routine x 2)     Status: None (Preliminary result)   Collection Time: 10/17/19  9:22 AM   Specimen: Left Antecubital; Blood  Result Value Ref Range Status   Specimen Description   Final    LEFT ANTECUBITAL Performed at [facility] [address] [place], [place] [address]    Special Requests   Final    BOTTLES DRAWN AEROBIC AND ANAEROBIC Blood Culture adequate volume Performed at [facility] [address] [place], [place] [address]    Culture   Final    NO GROWTH 3 DAYS Performed at [facility], [place] [address] [place], [place] [address]    Report Status PENDING  Incomplete  Urine culture     Status: None   Collection Time: 10/17/19 11:13 AM   Specimen: In/Out Cath Urine  Result Value Ref Range Status   Specimen Description   Final    IN/OUT CATH URINE Performed at [facility] [address] [place], [place] [address]    Special Requests   Final    NONE Performed at [facility], [place] [address], [place], [place] [address]    Culture   Final    NO GROWTH Performed at [facility], [place] [address], [place], [place] [address]    Report Status 10/18/2019 FINAL  Final    Radiology  Reports DG Chest Port 1 View  Result Date: 10/17/2019 CLINICAL DATA:  64 year old female with history of fever. EXAM: PORTABLE CHEST 1 VIEW COMPARISON:  Chest x-ray 05/29/2019. FINDINGS: Right subclavian single-lumen porta cath with tip terminating at the superior cavoatrial junction. Lung volumes are normal. No consolidative airspace disease. No pleural effusions. No pneumothorax. No pulmonary nodule or mass noted. Pulmonary vasculature and the cardiomediastinal silhouette are within normal limits. Atherosclerosis in the thoracic aorta. IMPRESSION: 1. No radiographic evidence of acute cardiopulmonary disease. 2. Aortic atherosclerosis. Electronically Signed   By: Vinnie Langton M.D.   On: 10/17/2019 10:15   VAS [place] LOWER EXTREMITY VENOUS (DVT)  Result Date: 10/19/2019  Lower Venous DVTStudy Indications: Pain. Other Indications: Covid+. Risk Factors: Cancer Breast. Anticoagulation: Lovenox. Comparison Study: No prior exam. Performing Technologist: Baldwin Crown ARDMS, RVT  Examination Guidelines: A complete evaluation includes B-mode imaging, spectral Doppler, color Doppler, and power Doppler as needed of all accessible portions of each vessel. Bilateral testing is considered an integral part of a complete examination. Limited examinations for reoccurring indications may be performed as noted. The reflux portion of the exam is performed with the patient in reverse Trendelenburg.  +---------+---------------+---------+-----------+----------+--------------+ RIGHT    CompressibilityPhasicitySpontaneityPropertiesThrombus Aging +---------+---------------+---------+-----------+----------+--------------+ CFV      Full           Yes      Yes                                 +---------+---------------+---------+-----------+----------+--------------+ SFJ      Full                                                        +---------+---------------+---------+-----------+----------+--------------+  FV  Prox  Full                                                        +---------+---------------+---------+-----------+----------+--------------+ FV Mid   Full                                                        +---------+---------------+---------+-----------+----------+--------------+ FV DistalFull                                                        +---------+---------------+---------+-----------+----------+--------------+ PFV      Full                                                        +---------+---------------+---------+-----------+----------+--------------+ POP      Full           Yes      Yes                                 +---------+---------------+---------+-----------+----------+--------------+ PTV      Full                                                        +---------+---------------+---------+-----------+----------+--------------+ PERO     Full                                                        +---------+---------------+---------+-----------+----------+--------------+   +---------+---------------+---------+-----------+----------+--------------+ LEFT     CompressibilityPhasicitySpontaneityPropertiesThrombus Aging +---------+---------------+---------+-----------+----------+--------------+ CFV      Full           Yes      Yes                                 +---------+---------------+---------+-----------+----------+--------------+ SFJ      Full                                                        +---------+---------------+---------+-----------+----------+--------------+ FV Prox  Full                                                        +---------+---------------+---------+-----------+----------+--------------+  FV Mid   Full                                                        +---------+---------------+---------+-----------+----------+--------------+ FV DistalFull                                                         +---------+---------------+---------+-----------+----------+--------------+ PFV      Full                                                        +---------+---------------+---------+-----------+----------+--------------+ POP      Full           Yes      Yes                                 +---------+---------------+---------+-----------+----------+--------------+ PTV      Full                                                        +---------+---------------+---------+-----------+----------+--------------+ PERO     Full                                                        +---------+---------------+---------+-----------+----------+--------------+     Summary: BILATERAL: - No evidence of deep vein thrombosis seen in the lower extremities, bilaterally.  RIGHT: - No cystic structure found in the popliteal fossa.  LEFT: - No cystic structure found in the popliteal fossa.  *See table(s) above for measurements and observations. Electronically signed by Ruta Hinds MD on 10/19/2019 at 3:22:01 PM.    Final      Time Spent in minutes  30     Desiree Hane M.D on 10/20/2019 at 2:07 PM  To page go to [email or web address] - password [facility]

## 2019-10-20 NOTE — Progress Notes (Addendum)
HEMATOLOGY-ONCOLOGY PROGRESS NOTE  SUBJECTIVE: Megan Khan was admitted due to febrile illness. Noted to be COVID-19 + without respiratory symptoms. No fevers in the past 24 hours. BC negative to date. UC with no growth. Total WBC decreased to 2.6 today and ANC 0.8. Was normal prior to today. Last Taxol 09/24/2019 and now receiving trastuzumab only. States she feels fine today. Wants to go home. States she does not want to stay in the hospital anymore.   Oncology History  Malignant neoplasm of upper-outer quadrant of right breast in female, estrogen receptor negative (Megan Khan)  03/19/2019 Initial Diagnosis   Malignant neoplasm of upper-outer quadrant of right breast in female, estrogen receptor negative (Megan Khan)   05/29/2019 Cancer Staging   Staging form: Breast, AJCC 8th Edition - Pathologic stage from 05/29/2019: Stage IA (pT1b, pN0, cM0, G2, ER-, PR-, HER2+) - Signed by Gardenia Phlegm, NP on 06/17/2019   07/10/2019 -  Chemotherapy   The patient had PACLitaxel-protein bound (ABRAXANE) chemo infusion 175 mg, 100 mg/m2 = 175 mg (100 % of original dose 100 mg/m2), Intravenous,  Once, 3 of 3 cycles Dose modification: 100 mg/m2 (original dose 100 mg/m2, Cycle 1) Administration: 175 mg (07/10/2019), 175 mg (07/17/2019), 175 mg (08/06/2019), 175 mg (07/23/2019), 175 mg (07/30/2019), 175 mg (08/12/2019), 175 mg (09/24/2019), 175 mg (08/20/2019), 175 mg (08/27/2019), 175 mg (09/03/2019), 175 mg (09/10/2019), 175 mg (09/17/2019) trastuzumab-dkst (OGIVRI) 300 mg in sodium chloride 0.9 % 250 mL chemo infusion, 294 mg, Intravenous,  Once, 4 of 16 cycles Dose modification: 150 mg (original dose 2 mg/kg, Cycle 1, Reason: Other (see comments), Comment: rounded; vial size), 450 mg (original dose 450 mg, Cycle 4, Reason: Other (see comments), Comment: dose rounded for vial size), 450 mg (original dose 2 mg/kg, Cycle 3, Reason: Other (see comments), Comment: maint trastuzumab) Administration: 300 mg (07/10/2019), 150 mg  (07/17/2019), 150 mg (08/06/2019), 450 mg (10/15/2019), 150 mg (07/23/2019), 150 mg (07/30/2019), 150 mg (08/12/2019), 450 mg (09/24/2019), 150 mg (08/20/2019), 150 mg (08/27/2019), 150 mg (09/03/2019), 150 mg (09/10/2019), 150 mg (09/17/2019)  for chemotherapy treatment.       REVIEW OF SYSTEMS:   Noncontributory except as noted in the HPI.   I have reviewed the past medical history, past surgical history, social history and family history with the patient and they are unchanged from previous note.   PHYSICAL EXAMINATION: ECOG PERFORMANCE STATUS: 1 - Symptomatic but completely ambulatory  Vitals:   10/19/19 2059 10/20/19 0602  BP: 119/83 (!) 133/93  Pulse: 85 86  Resp:  18  Temp: 98 F (36.7 C) 99 F (37.2 C)  SpO2: 96% 94%   Filed Weights   10/17/19 0904  Weight: 71.7 kg    Intake/Output from previous day: 03/08 0701 - 03/09 0700 In: 22 [P.O.:680] Out: 400 [Urine:400]  Exam limited due to COVID-19 infection.  GENERAL:alert, no distress and comfortable NEURO: alert & oriented x 3 with fluent speech, no focal motor/sensory deficits  LABORATORY DATA:  I have reviewed the data as listed CMP Latest Ref Rng & Units 10/19/2019 10/18/2019 10/17/2019  Glucose 70 - 99 mg/dL 223(H) 162(H) 218(H)  BUN 8 - 23 mg/dL _0 Creatinine 0.44 - 1.00 mg/dL 0.67 0.64 0.73  Sodium 135 - 145 mmol/L 133(L) 134(L) 134(L)  Potassium 3.5 - 5.1 mmol/L 3.6 3.1(L) 3.4(L)  Chloride 98 - 111 mmol/L 101 100 100  CO2 22 - 32 mmol/L _1 Calcium 8.9 - 10.3 mg/dL 8.8(L) 8.8(L) 8.7(L)  Total Protein 6.5 -  8.1 g/dL - 6.7 6.8  Total Bilirubin 0.3 - 1.2 mg/dL - 0.6 0.7  Alkaline Phos 38 - 126 U/L - 64 69  AST 15 - 41 U/L - 39 45(H)  ALT 0 - 44 U/L - 48(H) 49(H)    Lab Results  Component Value Date   WBC 2.6 (L) 10/20/2019   HGB 12.7 10/20/2019   HCT 40.3 10/20/2019   MCV 86.5 10/20/2019   PLT 195 10/20/2019   NEUTROABS 0.8 (L) 10/20/2019    DG Chest Port 1 View  Result Date:  10/17/2019 CLINICAL DATA:  64 year old female with history of fever. EXAM: PORTABLE CHEST 1 VIEW COMPARISON:  Chest x-ray 05/29/2019. FINDINGS: Right subclavian single-lumen porta cath with tip terminating at the superior cavoatrial junction. Lung volumes are normal. No consolidative airspace disease. No pleural effusions. No pneumothorax. No pulmonary nodule or mass noted. Pulmonary vasculature and the cardiomediastinal silhouette are within normal limits. Atherosclerosis in the thoracic aorta. IMPRESSION: 1. No radiographic evidence of acute cardiopulmonary disease. 2. Aortic atherosclerosis. Electronically Signed   By: Vinnie Langton M.D.   On: 10/17/2019 10:15   VAS [place] LOWER EXTREMITY VENOUS (DVT)  Result Date: 10/19/2019  Lower Venous DVTStudy Indications: Pain. Other Indications: Covid+. Risk Factors: Cancer Breast. Anticoagulation: Lovenox. Comparison Study: No prior exam. Performing Technologist: Baldwin Crown ARDMS, RVT  Examination Guidelines: A complete evaluation includes B-mode imaging, spectral Doppler, color Doppler, and power Doppler as needed of all accessible portions of each vessel. Bilateral testing is considered an integral part of a complete examination. Limited examinations for reoccurring indications may be performed as noted. The reflux portion of the exam is performed with the patient in reverse Trendelenburg.  +---------+---------------+---------+-----------+----------+--------------+ RIGHT    CompressibilityPhasicitySpontaneityPropertiesThrombus Aging +---------+---------------+---------+-----------+----------+--------------+ CFV      Full           Yes      Yes                                 +---------+---------------+---------+-----------+----------+--------------+ SFJ      Full                                                        +---------+---------------+---------+-----------+----------+--------------+ FV Prox  Full                                                         +---------+---------------+---------+-----------+----------+--------------+ FV Mid   Full                                                        +---------+---------------+---------+-----------+----------+--------------+ FV DistalFull                                                        +---------+---------------+---------+-----------+----------+--------------+ PFV  Full                                                        +---------+---------------+---------+-----------+----------+--------------+ POP      Full           Yes      Yes                                 +---------+---------------+---------+-----------+----------+--------------+ PTV      Full                                                        +---------+---------------+---------+-----------+----------+--------------+ PERO     Full                                                        +---------+---------------+---------+-----------+----------+--------------+   +---------+---------------+---------+-----------+----------+--------------+ LEFT     CompressibilityPhasicitySpontaneityPropertiesThrombus Aging +---------+---------------+---------+-----------+----------+--------------+ CFV      Full           Yes      Yes                                 +---------+---------------+---------+-----------+----------+--------------+ SFJ      Full                                                        +---------+---------------+---------+-----------+----------+--------------+ FV Prox  Full                                                        +---------+---------------+---------+-----------+----------+--------------+ FV Mid   Full                                                        +---------+---------------+---------+-----------+----------+--------------+ FV DistalFull                                                         +---------+---------------+---------+-----------+----------+--------------+ PFV      Full                                                        +---------+---------------+---------+-----------+----------+--------------+  POP      Full           Yes      Yes                                 +---------+---------------+---------+-----------+----------+--------------+ PTV      Full                                                        +---------+---------------+---------+-----------+----------+--------------+ PERO     Full                                                        +---------+---------------+---------+-----------+----------+--------------+     Summary: BILATERAL: - No evidence of deep vein thrombosis seen in the lower extremities, bilaterally.  RIGHT: - No cystic structure found in the popliteal fossa.  LEFT: - No cystic structure found in the popliteal fossa.  *See table(s) above for measurements and observations. Electronically signed by Ruta Hinds MD on 10/19/2019 at 3:22:01 PM.    Final     ASSESSMENT: 64 y.o. [place], [place] woman status post right breast upper outer quadrant biopsy 03/11/2019 for a clinical t1b N0, stage IA invasive ductal carcinoma, grade 2, estrogen and progesterone receptor negative, HER-2 amplified, with an MIB-1 of 10%.   (1) genetics testing 03/30/2019: declined by patient   (2) Right breast lumpectomy on 05/29/2019: pT1b pN0, stage IA invasive ductal carciinoma, grade 2,              (a) a total of 5 lymph nodes were negative for carcinoma             (b) superior margin positive, cleared with addition surgery on 06/10/2019              (c) surgery delayed due to abnormal EKG, patient cleared by Dr. Gwenlyn Found on 04/21/2019   (3) adjuvant chemotherapy consisting of Abraxane weekly x12 with Trastuzumab, starting 07/10/2019, completed 09/24/2019             (a) Abraxane given due to diabetes, and inability to tolerate steroid premedication  required with Paclitaxel.   (4) trastuzumab to be continued to complete 1 year (through November 2021             (a) baseline echo 04/06/2019 shows an ejection fraction greater than 65%             (b) echo on 07/22/2019 shows EF 65-70%             (c) echo 10/21/2019   (5) adjuvant radiation pending     PLAN: Megan Khan reports that she feels fine today. Neutropenia noted on labs today (previously normal). Now receiving trastuzumab only which does not cause neutropenia. She is currently afebrile and cultures are negative. Neutropenia could be COVID related. She is without respiratory symptoms. Will discuss with Dr. Jana Hakim about timing of D/C.    LOS: 2 days   Mikey Bussing, DNP, AGPCNP-BC, AOCNP 10/20/19   ADDENDUM: Megan Khan is agitating to go home. We discussed the fact that she does have COVID 19 and that she  can decompensate, though right now she has no respiratory symptoms. At home (with her disabled son) she has aides come in daily and they could help if she starts to feel worse. Also her sister has a pulse-ox, she says, and could monitor that if necessary.  As noted above, none of the treatments we are giving Megan Khan at this point would explain he neutropenia. This is not a common problem with COVID infections but there is at least one case report (from 2019) and I have no better explanation.  She agreed to stay until tomorrow AM when we can recheck her CBC/diff. I anticipate discharge after that but will review counts then.  I personally saw this patient and performed a substantive portion of this encounter with the listed APP documented above.   Chauncey Cruel, MD Medical Oncology and Hematology [facility] [address] [place], [place] [address] Tel. [phone]    Fax. [phone]

## 2019-10-20 NOTE — Plan of Care (Signed)
  Problem: Education: Goal: Knowledge of General Education information will improve Description: Including pain rating scale, medication(s)/side effects and non-pharmacologic comfort measures Outcome: Progressing   Problem: Activity: Goal: Risk for activity intolerance will decrease Outcome: Progressing   Problem: Elimination: Goal: Will not experience complications related to bowel motility Outcome: Progressing Goal: Will not experience complications related to urinary retention Outcome: Progressing   Problem: Pain Managment: Goal: General experience of comfort will improve Outcome: Progressing   Problem: Safety: Goal: Ability to remain free from injury will improve Outcome: Progressing   

## 2019-10-21 ENCOUNTER — Ambulatory Visit (HOSPITAL_COMMUNITY): Payer: Medicare Other

## 2019-10-21 ENCOUNTER — Encounter: Payer: Self-pay | Admitting: *Deleted

## 2019-10-21 ENCOUNTER — Encounter (HOSPITAL_COMMUNITY): Payer: Medicare Other | Admitting: Internal Medicine

## 2019-10-21 LAB — GLUCOSE, CAPILLARY
Glucose-Capillary: 225 mg/dL — ABNORMAL HIGH (ref 70–99)
Glucose-Capillary: 223 mg/dL — ABNORMAL HIGH (ref 70–99)

## 2019-10-21 LAB — CBC WITH DIFFERENTIAL/PLATELET
Abs Immature Granulocytes: 0 10*3/uL (ref 0.00–0.07)
Basophils Absolute: 0 10*3/uL (ref 0.0–0.1)
Basophils Relative: 1 %
Eosinophils Absolute: 0.1 10*3/uL (ref 0.0–0.5)
Eosinophils Relative: 4 %
HCT: 39.2 % (ref 36.0–46.0)
Hemoglobin: 12.4 g/dL (ref 12.0–15.0)
Immature Granulocytes: 0 %
Lymphocytes Relative: 52 %
Lymphs Abs: 1.5 10*3/uL (ref 0.7–4.0)
MCH: 27.4 pg (ref 26.0–34.0)
MCHC: 31.6 g/dL (ref 30.0–36.0)
MCV: 86.7 fL (ref 80.0–100.0)
Monocytes Absolute: 0.3 10*3/uL (ref 0.1–1.0)
Monocytes Relative: 11 %
Neutro Abs: 0.9 10*3/uL — ABNORMAL LOW (ref 1.7–7.7)
Neutrophils Relative %: 32 %
Platelets: 199 10*3/uL (ref 150–400)
RBC: 4.52 MIL/uL (ref 3.87–5.11)
RDW: 15.4 % (ref 11.5–15.5)
WBC: 2.9 10*3/uL — ABNORMAL LOW (ref 4.0–10.5)
nRBC: 0 % (ref 0.0–0.2)

## 2019-10-21 MED ORDER — HEPARIN SOD (PORK) LOCK FLUSH 100 UNIT/ML IV SOLN
500.0000 [IU] | INTRAVENOUS | Status: AC | PRN
  Administered 2019-10-21: 500 [IU]
  Filled 2019-10-21: qty 5

## 2019-10-21 MED ORDER — SALINE SPRAY 0.65 % NA SOLN: 1.0000 | NASAL | 0 refills | Status: DC | PRN

## 2019-10-21 MED ORDER — SALINE SPRAY 0.65 % NA SOLN
1.0000 | NASAL | Status: DC | PRN
  Filled 2019-10-21: qty 44

## 2019-10-21 NOTE — Care Management Important Message (Signed)
Important Message  Patient Details IM Letter given to Evette Cristal SW Case Manager to present to the Patient Name: Megan Khan MRN: [id number] Date of Birth: [birth year]   Medicare Important Message Given:  Yes     Kerin Salen 10/21/2019, 12:11 PM

## 2019-10-21 NOTE — Progress Notes (Signed)
Megan Khan's ANC today is rising at 0.9. Vitals are stable. She has good O2 sats.-- I am comfortable with her being discharged today, if that is her attending's judgement.   I do not see an indication for antibiotics from an oncology point of view. Patient does have support at home and she has follow up appointments with Radiation Oncology 03/15 at 9:10 AM and with me 03/25 at 08:15 AM  Will sign off at this time. Thank you for your care to this patient!  GM

## 2019-10-21 NOTE — Discharge Summary (Signed)
Physician Discharge Summary  Megan Khan:[id number] DOB: 1955/08/26 DOA: 10/17/2019  PCP: [name], DO  Admit date: 10/17/2019 Discharge date: 10/21/2019  Admitted From: Home  Disposition:  Home  Recommendations for Outpatient Follow-up:  1. Follow up with PCP in 1-2 weeks 2. Please obtain BMP/CBC in one week Please follow up with Dr Jana Hakim as recommended.     Discharge Condition: stable.  CODE STATUS: full code.  Diet recommendation: Heart Healthy    Brief/Interim Summary:  Megan Khan is a 64 y.o. year old female with medical history significant for breast cancer (last trastuzamab infusion 10/15/19), HTN, HLD,  who presented on 10/17/2019 with daughter reported changes in mentation and fever of 105-106 at home x 1 day prior to admission.   Discharge Diagnoses:  Principal Problem:   Fever Active Problems:   Diabetes mellitus, type 2 (HCC)   Hyperlipidemia   Open-angle glaucoma of both eyes, severe stage   Malignant neoplasm of upper-outer quadrant of right breast in female, estrogen receptor negative (HCC)   Neutropenia (HCC)  Febrile illness in patient actively undergoing chemotherapy, stable Afebrile overnight.  Suspect noninfectious source given procalcitonin trend negative..She is COVID + but no hypoxia or infiltrates on CXR to suggest starting treatment. No dysuria, no localizing signs of symptoms other than left sided leg pain for which DVT was considered but venous u/s negative. Her port site looks clean. Venous duplex negative  --If blood cultures remain negative and no fever overnight.  -No current indication for antibiotics  Leukopenia with moderate neutropenia, new On admission WBC was wnl at baseline but today ANC now 900. Blood cultures x2 unremarkable. No fever ( >100.4) for > 24 hours. --consulted oncology(followed by Dr. Jana Hakim) cleared for discharged without antibiotics.    COVID + No respiratory symptoms. No infiltrates on CXR. D-dimer  and CRP unremarkable --procalcitonin trend x2 less than 0.10 --no indication for IV remdesivir or steroids --continue contact/airborne precautions for a total of 3 weeks.   Hypokalemia Likely related to HCTZ.  Mag within normal limits Repleted.     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    HLD Her lovastatin was recently increased to 40 mg a few days ago.  --We will DC statin therapy, given persistent leg pain  Right sided breast cancer, ER/PR negative, currently undergoing Trastuzumab therapy Last Taxol treatment 09/24/2019 Received Trastuzumab on 10/15/19 Followed by Dr. Gwenyth Bender given new onset neutropenia   Discharge Instructions  Discharge Instructions    Diet - low sodium heart healthy   Complete by: As directed    Discharge instructions   Complete by: As directed    Please follow up with PCP in one week  Please follow up with Dr Jana Hakim as recommended.     Allergies as of 10/21/2019      Reactions   Sulfamethoxazole Rash   REACTION: Questionable allergy ?      Medication List    STOP taking these medications   lovastatin 40 MG tablet Commonly known as: MEVACOR     TAKE these medications   amLODipine 10 MG tablet Commonly known as: NORVASC TAKE 1 TABLET(10 MG) BY MOUTH DAILY What changed:   how much to take  how to take this  when to take this  additional instructions   bimatoprost 0.01 % Soln Commonly known as: LUMIGAN Place 2 drops into the left eye at bedtime.   blood glucose meter kit and supplies  Kit Dispense based on patient and insurance preference. Use up to four times daily as directed. (FOR ICD-9 250.00, 250.01).   dorzolamide 2 % ophthalmic solution Commonly known as: TRUSOPT Place 2 drops into the left eye 3 (three) times daily.   gabapentin 100 MG capsule Commonly known as: NEURONTIN TAKE 2 CAPSULES(200 MG) BY MOUTH THREE TIMES DAILY What changed: See  the new instructions.   lidocaine-prilocaine cream Commonly known as: EMLA Apply to affected area once What changed:   how much to take  how to take this  when to take this  reasons to take this  additional instructions   lisinopril-hydrochlorothiazide 20-25 MG tablet Commonly known as: ZESTORETIC Take 1 tablet by mouth daily.   magic mouthwash Soln Take 5 mLs by mouth 3 (three) times daily as needed for mouth pain.   metFORMIN 1000 MG tablet Commonly known as: GLUCOPHAGE Take 1 tablet (1,000 mg total) by mouth 2 (two) times daily with a meal.   metoprolol succinate 50 MG 24 hr tablet Commonly known as: TOPROL-XL TAKE 1 TABLET(50 MG) BY MOUTH DAILY What changed: See the new instructions.   prochlorperazine 10 MG tablet Commonly known as: COMPAZINE TAKE 1 TABLET(10 MG) BY MOUTH EVERY 6 HOURS AS NEEDED FOR NAUSEA OR VOMITING What changed: See the new instructions.      Follow-up Information    [name], DO. Schedule an appointment as soon as possible for a visit in 1 week(s).   Specialty: Family Medicine Contact information: [address] [address] [phone]        [name], [name], MD .   Specialty: Cardiology Contact information: [place] [address] [phone]          Allergies  Allergen Reactions  . Sulfamethoxazole Rash    REACTION: Questionable allergy ?    Consultations:  Oncology.    Procedures/Studies: DG Chest Port 1 View  Result Date: 10/17/2019 CLINICAL DATA:  64 year old female with history of fever. EXAM: PORTABLE CHEST 1 VIEW COMPARISON:  Chest x-ray 05/29/2019. FINDINGS: Right subclavian single-lumen porta cath with tip terminating at the superior cavoatrial junction. Lung volumes are normal. No consolidative airspace disease. No pleural effusions. No pneumothorax. No pulmonary nodule or mass noted. Pulmonary vasculature and the cardiomediastinal silhouette are  within normal limits. Atherosclerosis in the thoracic aorta. IMPRESSION: 1. No radiographic evidence of acute cardiopulmonary disease. 2. Aortic atherosclerosis. Electronically Signed   By: Vinnie Langton M.D.   On: 10/17/2019 10:15   VAS [place] LOWER EXTREMITY VENOUS (DVT)  Result Date: 10/19/2019  Lower Venous DVTStudy Indications: Pain. Other Indications: Covid+. Risk Factors: Cancer Breast. Anticoagulation: Lovenox. Comparison Study: No prior exam. Performing Technologist: Baldwin Crown ARDMS, RVT  Examination Guidelines: A complete evaluation includes B-mode imaging, spectral Doppler, color Doppler, and power Doppler as needed of all accessible portions of each vessel. Bilateral testing is considered an integral part of a complete examination. Limited examinations for reoccurring indications may be performed as noted. The reflux portion of the exam is performed with the patient in reverse Trendelenburg.  +---------+---------------+---------+-----------+----------+--------------+ RIGHT    CompressibilityPhasicitySpontaneityPropertiesThrombus Aging +---------+---------------+---------+-----------+----------+--------------+ CFV      Full           Yes      Yes                                 +---------+---------------+---------+-----------+----------+--------------+ SFJ  Full                                                        +---------+---------------+---------+-----------+----------+--------------+ FV Prox  Full                                                        +---------+---------------+---------+-----------+----------+--------------+ FV Mid   Full                                                        +---------+---------------+---------+-----------+----------+--------------+ FV DistalFull                                                        +---------+---------------+---------+-----------+----------+--------------+ PFV      Full                                                         +---------+---------------+---------+-----------+----------+--------------+ POP      Full           Yes      Yes                                 +---------+---------------+---------+-----------+----------+--------------+ PTV      Full                                                        +---------+---------------+---------+-----------+----------+--------------+ PERO     Full                                                        +---------+---------------+---------+-----------+----------+--------------+   +---------+---------------+---------+-----------+----------+--------------+ LEFT     CompressibilityPhasicitySpontaneityPropertiesThrombus Aging +---------+---------------+---------+-----------+----------+--------------+ CFV      Full           Yes      Yes                                 +---------+---------------+---------+-----------+----------+--------------+ SFJ      Full                                                        +---------+---------------+---------+-----------+----------+--------------+  FV Prox  Full                                                        +---------+---------------+---------+-----------+----------+--------------+ FV Mid   Full                                                        +---------+---------------+---------+-----------+----------+--------------+ FV DistalFull                                                        +---------+---------------+---------+-----------+----------+--------------+ PFV      Full                                                        +---------+---------------+---------+-----------+----------+--------------+ POP      Full           Yes      Yes                                 +---------+---------------+---------+-----------+----------+--------------+ PTV      Full                                                         +---------+---------------+---------+-----------+----------+--------------+ PERO     Full                                                        +---------+---------------+---------+-----------+----------+--------------+     Summary: BILATERAL: - No evidence of deep vein thrombosis seen in the lower extremities, bilaterally.  RIGHT: - No cystic structure found in the popliteal fossa.  LEFT: - No cystic structure found in the popliteal fossa.  *See table(s) above for measurements and observations. Electronically signed by Ruta Hinds MD on 10/19/2019 at 3:22:01 PM.    Final        Subjective: No new complaints, looking forward to going home.   Discharge Exam: Vitals:   10/20/19 1959 10/21/19 0430  BP: 134/87 (!) 130/96  Pulse: 88 80  Resp: 16 18  Temp: 98 F (36.7 C) 98.3 F (36.8 C)  SpO2: 95% 96%   Vitals:   10/19/19 2059 10/20/19 0602 10/20/19 1959 10/21/19 0430  BP: 119/83 (!) 133/93 134/87 (!) 130/96  Pulse: 85 86 88 80  Resp:  '18 16 18  '$ Temp: 98 F (36.7 C) 99 F (37.2 C) 98 F (36.7 C) 98.3 F (36.8 C)  TempSrc: Oral Oral Oral Oral  SpO2: 96% 94% 95% 96%  Weight:      Height:        General: Pt is alert, awake, not in acute distress Cardiovascular: RRR, S1/S2 +, no rubs, no gallops Respiratory: CTA bilaterally, no wheezing, no rhonchi Abdominal: Soft, NT, ND, bowel sounds + Extremities: no edema, no cyanosis    The results of significant diagnostics from this hospitalization (including imaging, microbiology, ancillary and laboratory) are listed below for reference.     Microbiology: Recent Results (from the past 240 hour(s))  Blood Culture (routine x 2)     Status: None (Preliminary result)   Collection Time: 10/17/19  9:17 AM   Specimen: Porta Cath; Blood  Result Value Ref Range Status   Specimen Description   Final    PORTA CATH Performed at [facility] [address], [place], [place] [address]    Special Requests   Final     BOTTLES DRAWN AEROBIC AND ANAEROBIC Blood Culture adequate volume Performed at [facility] [address], [place], [place] [address]    Culture   Final    NO GROWTH 4 DAYS Performed at [facility], [place] [address] [place], [place] [address]    Report Status PENDING  Incomplete  SARS CORONAVIRUS 2 (TAT 6-24 HRS) Nasopharyngeal Nasopharyngeal Swab     Status: Abnormal   Collection Time: 10/17/19  9:19 AM   Specimen: Nasopharyngeal Swab  Result Value Ref Range Status   SARS Coronavirus 2 POSITIVE (A) NEGATIVE Final    Comment: RESULT CALLED TO, READ BACK BY AND VERIFIED WITH: PAULA ARMSTRONG RN.'@1719'$  ON 3.6.21 BY TCALDWELL MT. (NOTE) SARS-CoV-2 target nucleic acids are DETECTED. The SARS-CoV-2 RNA is generally detectable in upper and lower respiratory specimens during the acute phase of infection. Positive results are indicative of the presence of SARS-CoV-2 RNA. Clinical correlation with patient history and other diagnostic information is  necessary to determine patient infection status. Positive results do not rule out bacterial infection or co-infection with other viruses.  The expected result is Negative. Fact Sheet for Patients: SugarRoll.be Fact Sheet for Healthcare Providers: https://www.woods-mathews.com/ This test is not yet approved or cleared by the [place] FDA and  has been authorized for detection and/or diagnosis of SARS-CoV-2 by FDA under an Emergency Use Authorization (EUA). This EUA will remain  in effect (meaning this test can  be used) for the duration of the COVID-19 declaration under Section 564(b)(1) of the Act, 21 U.S.C. section 360bbb-3(b)(1), unless the authorization is terminated or revoked sooner. Performed at [facility], [place] [address], [place], [place] [address]   Blood Culture (routine x 2)     Status: None (Preliminary result)   Collection Time: 10/17/19  9:22 AM    Specimen: Left Antecubital; Blood  Result Value Ref Range Status   Specimen Description   Final    LEFT ANTECUBITAL Performed at [facility] [address] [place], [place] [address]    Special Requests   Final    BOTTLES DRAWN AEROBIC AND ANAEROBIC Blood Culture adequate volume Performed at [facility] [address], [place], [place] [address]    Culture   Final    NO GROWTH 4 DAYS Performed at [facility], [place] [address], [place], [place] [address]    Report Status PENDING  Incomplete  Urine culture     Status: None   Collection Time: 10/17/19 11:13 AM   Specimen: In/Out Cath Urine  Result Value Ref Range Status   Specimen Description   Final    IN/OUT CATH URINE Performed at [facility] [address], [place], [place] [address]    Special Requests   Final    NONE Performed at [facility], [place] [address] [place], [place] [address]    Culture   Final    NO GROWTH Performed at [facility], [place] [address], [place], [place] [address]    Report Status 10/18/2019 FINAL  Final     Labs: BNP (last 3 results) No results for input(s): BNP in the last 8760 hours. Basic Metabolic Panel: Recent Labs  Lab 10/15/19 0854 10/17/19 0917 10/18/19 0328 10/18/19 1153 10/19/19 0354  NA 140 134* 134*  --  133*  K 3.7 3.4* 3.1*  --  3.6  CL 104 100 100  --  101  CO2 '26 25 25  '$ --  22  GLUCOSE 157* 218* 162*  --  223*  BUN '12 11 9  '$ --  16  CREATININE 0.80 0.73 0.64  --  0.67  CALCIUM 9.2 8.7* 8.8*  --  8.8*  MG  --   --   --  2.1  --    Liver Function Tests: Recent Labs  Lab 10/15/19 0854 10/17/19 0917 10/18/19 1007  AST 17 45* 39  ALT 26 49* 48*  ALKPHOS 88 69 64  BILITOT 0.4 0.7 0.6  PROT 7.0 6.8 6.7  ALBUMIN 4.1 4.0 3.9   No results for input(s): LIPASE, AMYLASE in the last 168 hours. No results for input(s): AMMONIA in the last 168 hours. CBC: Recent Labs  Lab  10/15/19 0854 10/17/19 0917 10/18/19 0328 10/20/19 0500 10/21/19 0322  WBC 7.8 9.0 4.2 2.6* 2.9*  NEUTROABS 4.0 6.8  --  0.8* 0.9*  HGB 12.7 12.2 12.6 12.7 12.4  HCT 39.2 39.0 39.8 40.3 39.2  MCV 85.6 88.6 86.5 86.5 86.7  PLT 289 243 221 195 199   Cardiac Enzymes: No results for input(s): CKTOTAL, CKMB, CKMBINDEX, TROPONINI in the last 168 hours. BNP: Invalid input(s): POCBNP CBG: Recent Labs  Lab 10/20/19 0755 10/20/19 1152 10/20/19 1738 10/20/19 1955 10/21/19 0748  GLUCAP 208* 236* 170* 281* 225*   D-Dimer No results for input(s): DDIMER in the last 72 hours. Hgb A1c No results for input(s): HGBA1C in the last 72 hours. Lipid Profile No results for input(s): CHOL, HDL, LDLCALC, TRIG, CHOLHDL, LDLDIRECT in the last 72 hours. Thyroid function studies No results for input(s): TSH, T4TOTAL, T3FREE, THYROIDAB in the last 72 hours.  Invalid input(s): FREET3 Anemia work up No results for input(s): VITAMINB12, FOLATE, FERRITIN, TIBC, IRON, RETICCTPCT in the last 72 hours. Urinalysis    Component Value Date/Time   COLORURINE YELLOW 10/17/2019 1112   APPEARANCEUR HAZY (A) 10/17/2019 1112   LABSPEC 1.011 10/17/2019 1112   PHURINE 7.0 10/17/2019 1112   GLUCOSEU 50 (A) 10/17/2019 1112   HGBUR NEGATIVE 10/17/2019 1112   BILIRUBINUR NEGATIVE 10/17/2019 1112   KETONESUR NEGATIVE 10/17/2019 1112   PROTEINUR NEGATIVE 10/17/2019 1112   NITRITE NEGATIVE 10/17/2019 1112   LEUKOCYTESUR NEGATIVE 10/17/2019 1112   Sepsis Labs Invalid input(s): PROCALCITONIN,  WBC,  LACTICIDVEN Microbiology Recent Results (from the past 240 hour(s))  Blood Culture (routine x 2)     Status: None (Preliminary result)   Collection Time: 10/17/19  9:17 AM   Specimen: Porta Cath; Blood  Result Value Ref Range Status   Specimen Description   Final  PORTA CATH Performed at [facility], [place] [address] [place], [place] [address]    Special Requests   Final    BOTTLES DRAWN  AEROBIC AND ANAEROBIC Blood Culture adequate volume Performed at [facility] [address], [place], [place] [address]    Culture   Final    NO GROWTH 4 DAYS Performed at [facility], [place] [address] [place], [place] [address]    Report Status PENDING  Incomplete  SARS CORONAVIRUS 2 (TAT 6-24 HRS) Nasopharyngeal Nasopharyngeal Swab     Status: Abnormal   Collection Time: 10/17/19  9:19 AM   Specimen: Nasopharyngeal Swab  Result Value Ref Range Status   SARS Coronavirus 2 POSITIVE (A) NEGATIVE Final    Comment: RESULT CALLED TO, READ BACK BY AND VERIFIED WITH: PAULA ARMSTRONG RN.'@1719'$  ON 3.6.21 BY TCALDWELL MT. (NOTE) SARS-CoV-2 target nucleic acids are DETECTED. The SARS-CoV-2 RNA is generally detectable in upper and lower respiratory specimens during the acute phase of infection. Positive results are indicative of the presence of SARS-CoV-2 RNA. Clinical correlation with patient history and other diagnostic information is  necessary to determine patient infection status. Positive results do not rule out bacterial infection or co-infection with other viruses.  The expected result is Negative. Fact Sheet for Patients: SugarRoll.be Fact Sheet for Healthcare Providers: https://www.woods-mathews.com/ This test is not yet approved or cleared by the [place] FDA and  has been authorized for detection and/or diagnosis of SARS-CoV-2 by FDA under an Emergency Use Authorization (EUA). This EUA will remain  in effect (meaning this test can  be used) for the duration of the COVID-19 declaration under Section 564(b)(1) of the Act, 21 U.S.C. section 360bbb-3(b)(1), unless the authorization is terminated or revoked sooner. Performed at [facility], [place] [address] [place], [place] [address]   Blood Culture (routine x 2)     Status: None (Preliminary result)   Collection Time: 10/17/19  9:22 AM   Specimen: Left  Antecubital; Blood  Result Value Ref Range Status   Specimen Description   Final    LEFT ANTECUBITAL Performed at [facility] [address] [place], [place] [address]    Special Requests   Final    BOTTLES DRAWN AEROBIC AND ANAEROBIC Blood Culture adequate volume Performed at [facility] [address] [place], [place] [address]    Culture   Final    NO GROWTH 4 DAYS Performed at [facility], [place] [address], [place], [place] [address]    Report Status PENDING  Incomplete  Urine culture     Status: None   Collection Time: 10/17/19 11:13 AM   Specimen: In/Out Cath Urine  Result Value Ref Range Status   Specimen Description   Final    IN/OUT CATH URINE Performed at [facility] [address], [place], [place] [address]    Special Requests   Final    NONE Performed at [facility], [place] [address], [place], [place] [address]    Culture   Final    NO GROWTH Performed at [facility], [place] [address], [place],  [address]    Report Status 10/18/2019 FINAL  Final     Time coordinating discharge: 38 minutes. Over 30 minutes  SIGNED:   Hosie Poisson, MD  [facility] 10/21/2019, 11:33 AM   If 7PM-7AM, please contact night-coverage [email or web address] Password [email or web address]

## 2019-10-21 NOTE — Progress Notes (Signed)
Inpatient Diabetes Program Recommendations  AACE/ADA: New Consensus Statement on Inpatient Glycemic Control   Target Ranges:  Prepandial:   less than 140 mg/dL      Peak postprandial:   less than 180 mg/dL (1-2 hours)      Critically ill patients:  140 - 180 mg/dL   Results for LESETTE, BUSEY (MRN [id number]) as of 10/21/2019 08:19  Ref. Range 10/20/2019 07:55 10/20/2019 11:52 10/20/2019 17:38 10/20/2019 19:55 10/21/2019 07:48  Glucose-Capillary Latest Ref Range: 70 - 99 mg/dL 208 (H) 236 (H) 170 (H) 281 (H) 225 (H)   Review of Glycemic Control  Diabetes history: DM2 Outpatient Diabetes medications: Metformin 1000 mg BID Current orders for Inpatient glycemic control: Lantus 10 units daily, Novolog 0-9 units TID with meals  Inpatient Diabetes Program Recommendations:   Insulin - Basal: Please consider increasing Lantus to 12 units daily.  Correction (SSI): Please consider adding Novolog 0-5 units QHS for bedtime correction.  Thanks, Barnie Alderman, RN, MSN, CDE Diabetes Coordinator Inpatient Diabetes Program [phone] (Team Pager from 8am to 5pm)

## 2019-10-22 ENCOUNTER — Telehealth: Payer: Self-pay

## 2019-10-22 LAB — CULTURE, BLOOD (ROUTINE X 2)
Culture: NO GROWTH
Culture: NO GROWTH
Special Requests: ADEQUATE
Special Requests: ADEQUATE

## 2019-10-22 NOTE — Telephone Encounter (Signed)
Pt calling to let Dr. Criss Rosales know that she came home from the hospital yesterday. The Dr.'s there took her off of her lovastatin due to cramps. She was told to inform her DR. Ottis Stain, CMA

## 2019-10-23 ENCOUNTER — Telehealth: Payer: Self-pay

## 2019-10-23 NOTE — Telephone Encounter (Signed)
Janett Billow at [employer] regarding patient receiving supplies for [employer]. Patient has had a recent insurance switch and current supplier is no longer covered. Representative provided supplier that is covered by insurance. Requests that paper rx be faxed to [facility] at [phone]. Contact number [phone]  Janett Billow also states that insurance will most likely require the last 6 months of OV notes and blood sugar readings.   To PCP and Pervis Hocking, RN

## 2019-10-26 ENCOUNTER — Ambulatory Visit: Payer: Medicare Other | Admitting: Radiation Oncology

## 2019-10-26 ENCOUNTER — Other Ambulatory Visit: Payer: Self-pay

## 2019-10-26 ENCOUNTER — Other Ambulatory Visit: Payer: Self-pay | Admitting: Family Medicine

## 2019-10-26 DIAGNOSIS — E1122 Type 2 diabetes mellitus with diabetic chronic kidney disease: Secondary | ICD-10-CM

## 2019-10-26 DIAGNOSIS — H401333 Pigmentary glaucoma, bilateral, severe stage: Secondary | ICD-10-CM

## 2019-10-26 NOTE — Addendum Note (Signed)
Addended by: Christen Bame D on: 10/26/2019 01:59 PM   Modules accepted: Orders

## 2019-10-26 NOTE — Progress Notes (Signed)
DME order placed for purpose of audio glucometer, patient with inability to see glucometer and has DM2  -Dr. Criss Rosales

## 2019-10-26 NOTE — Progress Notes (Signed)
Faxed to [facility] at [phone] as requested in last phone note.  Will await response before sending notes/Labs, as Janett Billow @ [facility] said they may require notes.

## 2019-10-26 NOTE — Telephone Encounter (Signed)
Faxed to [facility] at [phone] as requested in last phone note.  Will await response before sending notes/Labs, as Janett Billow @ [facility] said they may require notes.  Christen Bame, CMA

## 2019-10-27 ENCOUNTER — Ambulatory Visit: Payer: Medicare Other

## 2019-10-27 ENCOUNTER — Telehealth (INDEPENDENT_AMBULATORY_CARE_PROVIDER_SITE_OTHER): Payer: Medicare Other | Admitting: Family Medicine

## 2019-10-27 DIAGNOSIS — E785 Hyperlipidemia, unspecified: Secondary | ICD-10-CM

## 2019-10-27 DIAGNOSIS — E1169 Type 2 diabetes mellitus with other specified complication: Secondary | ICD-10-CM | POA: Diagnosis not present

## 2019-10-27 MED ORDER — LOVASTATIN 20 MG PO TABS: 20.0000 mg | ORAL_TABLET | Freq: Every day | ORAL | 3 refills | Status: DC

## 2019-10-27 NOTE — Progress Notes (Signed)
[facility] Telemedicine Visit  Patient consented to have virtual visit. Method of visit: Telephone  Encounter participants: Patient: TALER BOUTELL - located at home Provider: [name] - located at [facility]  Chief Complaint: discuss cholesterol meds  HPI:  There was some confusion about her cholesterol medication during her most recent hospital visit.  She was there for Covid but is now recovering at home.  Patient says she has no cramps on lower dose lovastation (20) and wants to restart her lower dose lovastatin, she said she was fine until she tried going up to the 40.  Will restart radiation on 29th for her cancer treatments   ROS: per HPI  Pertinent PMHx: Recent Covid diagnosis also had been scheduled for radiation for cancer  Exam:  Respiratory: Speaking in full sentences with no respite distress obvious over the phone, does appear to be at mental baseline from my prior history with her.  Assessment/Plan:  Hyperlipidemia associated with type 2 diabetes mellitus ([place]) Patient did well long-term on lovastatin 20, develop muscle aches when increasing to 40.  Discussed risk and reward given her age and she would like to return to 20 mg lovastatin    Time spent during visit with patient: 7 minutes

## 2019-10-28 ENCOUNTER — Ambulatory Visit: Payer: Medicare Other

## 2019-10-28 NOTE — Assessment & Plan Note (Signed)
>>  ASSESSMENT AND PLAN FOR HYPERLIPIDEMIA ASSOCIATED WITH TYPE 2 DIABETES MELLITUS (HCC) WRITTEN ON 10/28/2019  8:03 AM BY BLAND, SCOTT, DO  Patient did well long-term on lovastatin 20, develop muscle aches when increasing to 40.  Discussed risk and reward given her age and she would like to return to 20 mg lovastatin

## 2019-10-28 NOTE — Assessment & Plan Note (Signed)
Patient did well long-term on lovastatin 20, develop muscle aches when increasing to 40.  Discussed risk and reward given her age and she would like to return to 20 mg lovastatin

## 2019-10-29 ENCOUNTER — Ambulatory Visit: Payer: Medicare Other

## 2019-10-29 ENCOUNTER — Telehealth: Payer: Self-pay | Admitting: Oncology

## 2019-10-29 ENCOUNTER — Other Ambulatory Visit: Payer: Self-pay | Admitting: Oncology

## 2019-10-29 NOTE — Telephone Encounter (Signed)
Scheduled appt per 3/18 sch message - pt aware - letter mailed pt pt request

## 2019-10-30 ENCOUNTER — Other Ambulatory Visit: Payer: Self-pay | Admitting: Oncology

## 2019-10-30 ENCOUNTER — Ambulatory Visit: Payer: Medicare Other

## 2019-11-02 ENCOUNTER — Ambulatory Visit: Payer: Medicare Other

## 2019-11-03 ENCOUNTER — Ambulatory Visit: Payer: Medicare Other

## 2019-11-04 ENCOUNTER — Ambulatory Visit: Payer: Medicare Other

## 2019-11-05 ENCOUNTER — Other Ambulatory Visit: Payer: Medicare Other

## 2019-11-05 ENCOUNTER — Ambulatory Visit: Payer: Medicare Other | Admitting: Adult Health

## 2019-11-05 ENCOUNTER — Ambulatory Visit: Payer: Medicare Other

## 2019-11-05 ENCOUNTER — Telehealth: Payer: Self-pay | Admitting: *Deleted

## 2019-11-06 ENCOUNTER — Other Ambulatory Visit: Payer: Medicare Other

## 2019-11-06 ENCOUNTER — Ambulatory Visit: Payer: Medicare Other | Admitting: Adult Health

## 2019-11-06 ENCOUNTER — Ambulatory Visit: Payer: Medicare Other

## 2019-11-09 ENCOUNTER — Ambulatory Visit: Payer: Medicare Other

## 2019-11-10 ENCOUNTER — Ambulatory Visit: Payer: Medicare Other

## 2019-11-11 ENCOUNTER — Ambulatory Visit: Payer: Medicare Other

## 2019-11-12 ENCOUNTER — Ambulatory Visit: Payer: Medicare Other

## 2019-11-13 ENCOUNTER — Ambulatory Visit: Payer: Medicare Other | Admitting: Radiation Oncology

## 2019-11-13 ENCOUNTER — Ambulatory Visit: Payer: Medicare Other

## 2019-11-15 ENCOUNTER — Other Ambulatory Visit: Payer: Self-pay | Admitting: Family Medicine

## 2019-11-15 DIAGNOSIS — G8929 Other chronic pain: Secondary | ICD-10-CM

## 2019-11-16 ENCOUNTER — Ambulatory Visit: Payer: Medicare Other

## 2019-11-17 ENCOUNTER — Ambulatory Visit: Payer: Medicare Other

## 2019-11-17 ENCOUNTER — Telehealth: Payer: Self-pay | Admitting: Family Medicine

## 2019-11-17 DIAGNOSIS — E119 Type 2 diabetes mellitus without complications: Secondary | ICD-10-CM

## 2019-11-17 MED ORDER — BLOOD GLUCOSE TEST VI STRP: 1.0000 | ORAL_STRIP | Freq: Three times a day (TID) | 3 refills | Status: DC

## 2019-11-17 NOTE — Telephone Encounter (Signed)
Patient is calling and would like to have more test strips sent to the pharmacy. She said there has been some issues with insurance but she is now out of them completely.   She would like for Dr. Criss Rosales to call her to discuss.  The best call back is [phone].

## 2019-11-18 ENCOUNTER — Ambulatory Visit: Payer: Medicare Other

## 2019-11-19 ENCOUNTER — Encounter: Payer: Self-pay | Admitting: *Deleted

## 2019-11-19 ENCOUNTER — Ambulatory Visit: Payer: Medicare Other

## 2019-11-19 ENCOUNTER — Telehealth: Payer: Self-pay

## 2019-11-19 DIAGNOSIS — E1122 Type 2 diabetes mellitus with diabetic chronic kidney disease: Secondary | ICD-10-CM

## 2019-11-19 MED ORDER — PRODIGY AUTOCODE BLOOD GLUCOSE W/DEVICE KIT: 1.0000 | PACK | Freq: Three times a day (TID) | 0 refills | Status: DC

## 2019-11-19 NOTE — Progress Notes (Signed)
Pharmacist Chemotherapy Monitoring - Follow Up Assessment    I verify that I have reviewed each item in the below checklist:  . Regimen for the patient is scheduled for the appropriate day and plan matches scheduled date. Marland Kitchen Appropriate non-routine labs are ordered dependent on drug ordered. . If applicable, additional medications reviewed and ordered per protocol based on lifetime cumulative doses and/or treatment regimen.   Plan for follow-up and/or issues identified: Yes . I-vent associated with next due treatment: Yes . MD and/or nursing notified: Yes  Acquanetta Belling 11/19/2019 9:02 AM

## 2019-11-19 NOTE — Telephone Encounter (Signed)
Patient calls nurse line stating [facility] does not carry the Prodigy test strips. Patient stated [facility] [address] to ask and they do. Patient needs the Prodigy meter and test strips sent to [facility] [address]. I called the pharmacy to clarify. The pharmacist stated they do carry Prodigy, just not the strips. He did state if you send in generic test strips he will be able to "call around" and get them for the patient.

## 2019-11-20 ENCOUNTER — Ambulatory Visit: Payer: Medicare Other

## 2019-11-23 ENCOUNTER — Ambulatory Visit
Admission: RE | Admit: 2019-11-23 | Discharge: 2019-11-23 | Disposition: A | Discharge status: Home / Self Care | Payer: Medicare Other | Source: Ambulatory Visit | Attending: Radiation Oncology | Admitting: Radiation Oncology

## 2019-11-23 ENCOUNTER — Ambulatory Visit: Payer: Medicare Other

## 2019-11-23 ENCOUNTER — Other Ambulatory Visit: Payer: Self-pay

## 2019-11-23 DIAGNOSIS — C50411 Malignant neoplasm of upper-outer quadrant of right female breast: Secondary | ICD-10-CM | POA: Diagnosis not present

## 2019-11-23 DIAGNOSIS — Z171 Estrogen receptor negative status [ER-]: Secondary | ICD-10-CM | POA: Diagnosis present

## 2019-11-24 ENCOUNTER — Ambulatory Visit
Admission: RE | Admit: 2019-11-24 | Discharge: 2019-11-24 | Disposition: A | Discharge status: Home / Self Care | Payer: Medicare Other | Source: Ambulatory Visit | Attending: Radiation Oncology | Admitting: Radiation Oncology

## 2019-11-24 ENCOUNTER — Ambulatory Visit: Payer: Medicare Other

## 2019-11-24 ENCOUNTER — Other Ambulatory Visit: Payer: Self-pay | Admitting: *Deleted

## 2019-11-24 ENCOUNTER — Other Ambulatory Visit: Payer: Self-pay

## 2019-11-24 DIAGNOSIS — Z171 Estrogen receptor negative status [ER-]: Secondary | ICD-10-CM

## 2019-11-24 DIAGNOSIS — C50411 Malignant neoplasm of upper-outer quadrant of right female breast: Secondary | ICD-10-CM | POA: Diagnosis not present

## 2019-11-25 ENCOUNTER — Inpatient Hospital Stay: Payer: Medicare Other

## 2019-11-25 ENCOUNTER — Ambulatory Visit: Payer: Medicare Other

## 2019-11-25 ENCOUNTER — Telehealth: Payer: Self-pay

## 2019-11-25 ENCOUNTER — Inpatient Hospital Stay: Payer: Medicare Other | Attending: Oncology | Admitting: Adult Health

## 2019-11-25 ENCOUNTER — Ambulatory Visit: Payer: Medicare Other | Admitting: Adult Health

## 2019-11-25 ENCOUNTER — Ambulatory Visit
Admission: RE | Admit: 2019-11-25 | Discharge: 2019-11-25 | Disposition: A | Discharge status: Home / Self Care | Payer: Medicare Other | Source: Ambulatory Visit | Attending: Radiation Oncology | Admitting: Radiation Oncology

## 2019-11-25 ENCOUNTER — Other Ambulatory Visit: Payer: Self-pay

## 2019-11-25 ENCOUNTER — Encounter: Payer: Self-pay | Admitting: Adult Health

## 2019-11-25 VITALS — BP 148/92 | HR 79 | Temp 98.3°F | Resp 18 | Wt 155.7 lb

## 2019-11-25 DIAGNOSIS — Z5112 Encounter for antineoplastic immunotherapy: Secondary | ICD-10-CM | POA: Diagnosis present

## 2019-11-25 DIAGNOSIS — C50411 Malignant neoplasm of upper-outer quadrant of right female breast: Secondary | ICD-10-CM

## 2019-11-25 DIAGNOSIS — Z95828 Presence of other vascular implants and grafts: Secondary | ICD-10-CM

## 2019-11-25 DIAGNOSIS — Z171 Estrogen receptor negative status [ER-]: Secondary | ICD-10-CM | POA: Insufficient documentation

## 2019-11-25 DIAGNOSIS — E119 Type 2 diabetes mellitus without complications: Secondary | ICD-10-CM | POA: Insufficient documentation

## 2019-11-25 LAB — CMP (CANCER CENTER ONLY)
ALT: 21 U/L (ref 0–44)
AST: 10 U/L — ABNORMAL LOW (ref 15–41)
Albumin: 3.9 g/dL (ref 3.5–5.0)
Alkaline Phosphatase: 75 U/L (ref 38–126)
Anion gap: 10 (ref 5–15)
BUN: 12 mg/dL (ref 8–23)
CO2: 25 mmol/L (ref 22–32)
Calcium: 9.2 mg/dL (ref 8.9–10.3)
Chloride: 106 mmol/L (ref 98–111)
Creatinine: 0.77 mg/dL (ref 0.44–1.00)
GFR, Est AFR Am: >60 mL/min (ref >60)
GFR, Estimated: >60 mL/min (ref >60)
Glucose, Bld: 136 mg/dL — ABNORMAL HIGH (ref 70–99)
Potassium: 3.3 mmol/L — ABNORMAL LOW (ref 3.5–5.1)
Sodium: 141 mmol/L (ref 135–145)
Total Bilirubin: 0.3 mg/dL (ref 0.3–1.2)
Total Protein: 7 g/dL (ref 6.5–8.1)

## 2019-11-25 LAB — CBC WITH DIFFERENTIAL (CANCER CENTER ONLY)
Abs Immature Granulocytes: 0.02 10*3/uL (ref 0.00–0.07)
Basophils Absolute: 0.1 10*3/uL (ref 0.0–0.1)
Basophils Relative: 1 %
Eosinophils Absolute: 0.2 10*3/uL (ref 0.0–0.5)
Eosinophils Relative: 4 %
HCT: 40.3 % (ref 36.0–46.0)
Hemoglobin: 12.7 g/dL (ref 12.0–15.0)
Immature Granulocytes: 0 %
Lymphocytes Relative: 40 %
Lymphs Abs: 2.1 10*3/uL (ref 0.7–4.0)
MCH: 27.2 pg (ref 26.0–34.0)
MCHC: 31.5 g/dL (ref 30.0–36.0)
MCV: 86.3 fL (ref 80.0–100.0)
Monocytes Absolute: 0.5 10*3/uL (ref 0.1–1.0)
Monocytes Relative: 9 %
Neutro Abs: 2.4 10*3/uL (ref 1.7–7.7)
Neutrophils Relative %: 46 %
Platelet Count: 296 10*3/uL (ref 150–400)
RBC: 4.67 MIL/uL (ref 3.87–5.11)
RDW: 14 % (ref 11.5–15.5)
WBC Count: 5.2 10*3/uL (ref 4.0–10.5)
nRBC: 0 % (ref 0.0–0.2)

## 2019-11-25 MED ORDER — HEPARIN SOD (PORK) LOCK FLUSH 100 UNIT/ML IV SOLN
500.0000 [IU] | Freq: Once | INTRAVENOUS | Status: AC | PRN
  Administered 2019-11-25: 500 [IU]
  Filled 2019-11-25: qty 5

## 2019-11-25 MED ORDER — ACETAMINOPHEN 325 MG PO TABS
ORAL_TABLET | ORAL | Status: AC
  Filled 2019-11-25: qty 2

## 2019-11-25 MED ORDER — TRASTUZUMAB-DKST CHEMO 150 MG IV SOLR
450.0000 mg | Freq: Once | INTRAVENOUS | Status: AC
  Administered 2019-11-25: 450 mg via INTRAVENOUS
  Filled 2019-11-25: qty 21.43

## 2019-11-25 MED ORDER — SODIUM CHLORIDE 0.9 % IV SOLN
Freq: Once | INTRAVENOUS | Status: AC
  Filled 2019-11-25: qty 250

## 2019-11-25 MED ORDER — DIPHENHYDRAMINE HCL 25 MG PO CAPS
50.0000 mg | ORAL_CAPSULE | Freq: Once | ORAL | Status: AC
  Administered 2019-11-25: 50 mg via ORAL

## 2019-11-25 MED ORDER — HEPARIN SOD (PORK) LOCK FLUSH 100 UNIT/ML IV SOLN
500.0000 [IU] | Freq: Once | INTRAVENOUS | Status: DC
  Filled 2019-11-25: qty 5

## 2019-11-25 MED ORDER — SODIUM CHLORIDE 0.9% FLUSH
10.0000 mL | INTRAVENOUS | Status: DC | PRN
  Administered 2019-11-25: 10 mL
  Filled 2019-11-25: qty 10

## 2019-11-25 MED ORDER — ACETAMINOPHEN 325 MG PO TABS
650.0000 mg | ORAL_TABLET | Freq: Once | ORAL | Status: AC
  Administered 2019-11-25: 650 mg via ORAL

## 2019-11-25 MED ORDER — DIPHENHYDRAMINE HCL 25 MG PO CAPS
ORAL_CAPSULE | ORAL | Status: AC
  Filled 2019-11-25: qty 2

## 2019-11-25 MED ORDER — SODIUM CHLORIDE 0.9% FLUSH
10.0000 mL | Freq: Once | INTRAVENOUS | Status: AC
  Administered 2019-11-25: 10 mL
  Filled 2019-11-25: qty 10

## 2019-11-25 NOTE — Progress Notes (Signed)
Megan Khan  [phone] Fax:([phone]) [phone]    ID: Megan Khan DOB: 05-Nov-1955  MR#: [id number]  XKG#:[id number]  Patient Care Team: Megan Sires, DO as PCP - General [name], [name], MD as PCP - Advanced Heart Failure (Cardiology) [name], [name], MD as Consulting Physician (Oncology) Megan Rudd, MD as Consulting Physician (Radiation Oncology) Megan Khan, DPM as Consulting Physician (Podiatry) Megan Pearson, MD as Consulting Physician (Ophthalmology) Megan Horner, MD as Consulting Physician (Gastroenterology) Megan Kaufmann, RN as Oncology Nurse Navigator Megan Germany, RN as Oncology Nurse Navigator OTHER MD:   CHIEF COMPLAINT: Estrogen 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  She has completed adjuvant chemotherapy and is now receiving adjuvant Trastuzumab every 3 weeks.    She is also undergoing radiation therapy and started this last week.  She says that she is tolerating the radiation well and has no skin changes at this point that she has noticed.    She has not had her echocardiogram that was due in March because she was diagnosed with COVID and had to be 3 weeks from diagnosis to undergo the echocardiogram.  She has no leg swelling, chest pain, shortness of breath, palpitations, or orthopnea today.    REVIEW OF SYSTEMS: Megan Khan unfortunately lost her son recently.  This has been hard for her.  She denies any new issues like fever, chills, chest pain, palpitations, cough, bowel/bladder changes, nausea, and vomiting.  She has been around family during this difficult time.  A detailed ROS Was otherwise non contributory.       HISTORY OF CURRENT ILLNESS: From the original intake note:  Megan Khan had routine screening mammography on  02/24/2019 showing a possible abnormality in the right breast. She underwent unilateral right diagnostic mammography with tomography and right breast ultrasonography at [facility] on 03/04/2019 showing: Breast Density Category B. Additional imaging of the right breast was performed. There is a persistent asymmetry and distortion in the middle third of the upper-outer quadrant of the breast. There are no malignant type microcalcifications. On physical exam, I do not palpate a mass in the upper-outer quadrant of the right breast. Targeted ultrasound is performed, showing an irregular hypoechoic mass in the right breast 11 o'clock 7 cm from the nipple measuring 0.7 x 0.7 x 1.0 cm. Sonographic evaluation of the right axilla does not show any enlarged adenopathy.  Accordingly on 03/11/2019 she proceeded to biopsy of the right breast area in question. The pathology from this procedure showed ([id number]): invasive ductal carcinoma, grade II, 11 o'clock 7 cm from the nipple. Prognostic indicators significant for: estrogen receptor, 0% negative and progesterone receptor, 0% negative. Proliferation marker Ki67 at 10%. HER2 positive (3+) by immunohistochemistry.  The patient's subsequent history is as detailed below.    PAST MEDICAL HISTORY: Past Medical History:  Diagnosis Date  . Anemia    during pregnancy  . Cancer [facility]) 2020   breast cancer on right  . Diabetes mellitus    Type 2  . Family history of breast cancer   . Family history of throat cancer   . Glaucoma   . Glaucoma   . Hx of colonic polyps   . Hyperlipidemia   . Hypertension   . Legally blind    completely blind in left eye, low vision in right  PAST SURGICAL HISTORY: Past Surgical History:  Procedure Laterality Date  . BREAST LUMPECTOMY WITH RADIOACTIVE SEED AND SENTINEL LYMPH NODE BIOPSY Right 05/29/2019   Procedure: RIGHT BREAST LUMPECTOMY WITH RADIOACTIVE SEED AND RIGHT DEEP AXILLARY SENTINEL LYMPH NODE BIOPSY INJECT  BLUE DYE;  Surgeon: Megan Skates, MD;  Location: [facility];  Service: General;  Laterality: Right;  . COLONOSCOPY  01/22/2012   Procedure: COLONOSCOPY;  Surgeon: Megan Horner, MD;  Location: [facility];  Service: Endoscopy;  Laterality: N/A;  . COLONOSCOPY    . COLONOSCOPY    . PORTACATH PLACEMENT N/A 05/29/2019   Procedure: INSERTION PORT-A-CATH WITH ULTRASOUND;  Surgeon: Megan Skates, MD;  Location: [facility];  Service: General;  Laterality: N/A;  . RE-EXCISION OF BREAST LUMPECTOMY Right 06/10/2019   Procedure: RE-EXCISION OF RIGHT BREAST LUMPECTOMY MARGINS;  Surgeon: Megan Skates, MD;  Location: [facility];  Service: General;  Laterality: Right;  . TUBAL LIGATION      FAMILY HISTORY: Family History  Problem Relation Age of Onset  . Breast cancer Sister 18       Her2 +  . Breast cancer Khan        maternal second Khan, Bilateral mastectomy, diagnosed in her 40s   Megan Khan died from natural causes at age 44. Patients' mother died from natural causes at age 54. The patient has 5 brothers and 3 sisters.  The patient's sister, Megan Khan, is my patient diagnosed with breast cancer at 59.  The cyst cancer was estrogen receptor positive, HER-2 negative.  She received no chemo.  Megan Khan was diagnosed with breast cancer. Patient denies anyone in her family having ovarian, prostate, or pancreatic cancer.    GYNECOLOGIC HISTORY:  No LMP recorded. Patient is postmenopausal. Menarche: 64 years old Age at first live birth: 64 years old GX P: 3 LMP: 11 Contraceptive: no HRT: no  Hysterectomy?: no BSO?: no   SOCIAL HISTORY: (Current as of 03/30/2019) Megan Khan is disabled secondary to her glaucoma as she is legally blind.  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 She is divorced. She lives with her son, Megan Khan who is 73 and is disabled with cerebral palsy. He does attend school.  They have a new puppy named Megan Khan. Her son Megan Khan was killed  (gunshot wound) age 43. Her son, Megan Khan, is 72 and works for the [employer]. Her daughter, Megan Khan, is 58 and is a Quarry manager. Megan Khan has 7 grandchildren and 2 great-grandchildren.  She attends the [employer]: Not in place. She has verbally named her sister, Megan Khan, as her healthcare power of attorney.  At the 03/30/2019 visit she was given the appropriate documents to complete and notarize at her discretion   HEALTH MAINTENANCE: Social History   Tobacco Use  . Smoking status: Former Smoker    Types: Cigarettes    Quit date: 09/23/1995    Years since quitting: 24.1  . Smokeless tobacco: Never Used  Substance Use Topics  . Alcohol use: No  . Drug use: No    Colonoscopy: June 2013/again in  PAP: Per Dr. Criss Rosales  Bone density: Never  Allergies  Allergen Reactions  . Sulfamethoxazole Rash    REACTION: Questionable allergy ?    Current Outpatient Medications  Medication Sig Dispense Refill  . amLODipine (NORVASC) 10 MG tablet TAKE 1 TABLET(10 MG) BY MOUTH DAILY (Patient taking differently: Take 10 mg by mouth daily. )  90 tablet 3  . bimatoprost (LUMIGAN) 0.01 % SOLN Place 2 drops into the left eye at bedtime.     . blood glucose meter kit and supplies KIT Dispense based on patient and insurance preference. Use up to four times daily as directed. (FOR ICD-9 250.00, 250.01). 1 each 12  . Blood Glucose Monitoring Suppl (PRODIGY AUTOCODE BLOOD GLUCOSE) w/Device KIT 1 kit by Does not apply route in the morning, at noon, and at bedtime. Any brand acceptable if it works for patient 1 kit 0  . dorzolamide (TRUSOPT) 2 % ophthalmic solution Place 2 drops into the left eye 3 (three) times daily.     Marland Kitchen gabapentin (NEURONTIN) 100 MG capsule TAKE 2 CAPSULES(200 MG) BY MOUTH THREE TIMES DAILY 180 capsule 0  . Glucose Blood (BLOOD GLUCOSE TEST STRIPS) STRP 1 strip by In Vitro route 3 (three) times daily. 270 strip 3  . lidocaine-prilocaine (EMLA) cream Apply  to affected area once (Patient taking differently: Apply 1 application topically daily as needed. Port: Apply to affected area once) 30 g 3  . lisinopril-hydrochlorothiazide (ZESTORETIC) 20-25 MG tablet Take 1 tablet by mouth daily. 90 tablet 3  . lovastatin (MEVACOR) 20 MG tablet Take 1 tablet (20 mg total) by mouth at bedtime. 90 tablet 3  . magic mouthwash SOLN Take 5 mLs by mouth 3 (three) times daily as needed for mouth pain.     . metFORMIN (GLUCOPHAGE) 1000 MG tablet Take 1 tablet (1,000 mg total) by mouth 2 (two) times daily with a meal. 360 tablet 1  . metoprolol succinate (TOPROL-XL) 50 MG 24 hr tablet TAKE 1 TABLET(50 MG) BY MOUTH DAILY (Patient taking differently: Take 50 mg by mouth daily. ) 90 tablet 0  . prochlorperazine (COMPAZINE) 10 MG tablet TAKE 1 TABLET(10 MG) BY MOUTH EVERY 6 HOURS AS NEEDED FOR NAUSEA OR VOMITING (Patient taking differently: Take 10 mg by mouth every 8 (eight) hours as needed for nausea or vomiting. ) 30 tablet 1  . sodium chloride (OCEAN) 0.65 % SOLN nasal spray Place 1 spray into both nostrils as needed for congestion. 30 mL 0   No current facility-administered medications for this visit.     OBJECTIVE: Middle-aged African-American woman who appears stated age 37:   11/25/19 0933  BP: (!) 148/92  Pulse: 79  Resp: 18  Temp: 98.3 F (36.8 C)  SpO2: 100%     Body mass index is 28.94 kg/m.   Wt Readings from Last 3 Encounters:  11/25/19 155 lb 11.2 oz (70.6 kg)  10/17/19 158 lb (71.7 kg)  10/15/19 158 lb 8 oz (71.9 kg)  ECOG FS:1 - Symptomatic but completely ambulatory  GENERAL: Patient is a well appearing female in no acute distress HEENT:  Sclerae anicteric. Mask in place. Neck is supple.  NODES:  No cervical, supraclavicular, or axillary lymphadenopathy palpated.  BREAST EXAM:  Deferred. Inspected only due to current radiation therapy, no sign of local recurrence or desquamation present LUNGS:  Clear to auscultation bilaterally.  No  wheezes or rhonchi. HEART:  Regular rate and rhythm. No murmur appreciated. ABDOMEN:  Soft, nontender.  Positive, normoactive bowel sounds. No organomegaly palpated. MSK:  No focal spinal tenderness to palpation. Full range of motion bilaterally in the upper extremities. EXTREMITIES:  No peripheral edema.   SKIN:  Clear with no obvious rashes or skin changes. No nail dyscrasia. NEURO:  Nonfocal. Well oriented.  Appropriate affect.     LAB RESULTS:  CMP     Component  Value Date/Time   NA 141 11/25/2019 0839   NA 142 05/27/2018 1533   K 3.3 (L) 11/25/2019 0839   CL 106 11/25/2019 0839   CO2 25 11/25/2019 0839   GLUCOSE 136 (H) 11/25/2019 0839   BUN 12 11/25/2019 0839   BUN 19 05/27/2018 1533   CREATININE 0.77 11/25/2019 0839   CREATININE 0.79 01/30/2016 1130   CALCIUM 9.2 11/25/2019 0839   PROT 7.0 11/25/2019 0839   ALBUMIN 3.9 11/25/2019 0839   AST 10 (L) 11/25/2019 0839   ALT 21 11/25/2019 0839   ALKPHOS 75 11/25/2019 0839   BILITOT 0.3 11/25/2019 0839   GFRNONAA >60 11/25/2019 0839   GFRNONAA 82 01/30/2016 1130   GFRAA >60 11/25/2019 0839   GFRAA >89 01/30/2016 1130    No results found for: TOTALPROTELP, ALBUMINELP, A1GS, A2GS, BETS, BETA2SER, GAMS, MSPIKE, SPEI  No results found for: KPAFRELGTCHN, LAMBDASER, KAPLAMBRATIO  Lab Results  Component Value Date   WBC 5.2 11/25/2019   NEUTROABS 2.4 11/25/2019   HGB 12.7 11/25/2019   HCT 40.3 11/25/2019   MCV 86.3 11/25/2019   PLT 296 11/25/2019    No results found for: LABCA2  No components found for: [id number]  No results for input(s): INR in the last 168 hours.  No results found for: LABCA2  No results found for: TIW580  No results found for: DXI338  No results found for: SNK539  No results found for: CA2729  No components found for: HGQUANT  No results found for: CEA1 / No results found for: CEA1   No results found for: AFPTUMOR  No results found for: CHROMOGRNA  No results found for:  PSA1  Appointment on 11/25/2019  Component Date Value Ref Range Status  . Sodium 11/25/2019 141  135 - 145 mmol/L Final  . Potassium 11/25/2019 3.3* 3.5 - 5.1 mmol/L Final  . Chloride 11/25/2019 106  98 - 111 mmol/L Final  . CO2 11/25/2019 25  22 - 32 mmol/L Final  . Glucose, Bld 11/25/2019 136* 70 - 99 mg/dL Final   Glucose reference range applies only to samples taken after fasting for at least 8 hours.  . BUN 11/25/2019 12  8 - 23 mg/dL Final  . Creatinine 11/25/2019 0.77  0.44 - 1.00 mg/dL Final  . Calcium 11/25/2019 9.2  8.9 - 10.3 mg/dL Final  . Total Protein 11/25/2019 7.0  6.5 - 8.1 g/dL Final  . Albumin 11/25/2019 3.9  3.5 - 5.0 g/dL Final  . AST 11/25/2019 10* 15 - 41 U/L Final  . ALT 11/25/2019 21  0 - 44 U/L Final  . Alkaline Phosphatase 11/25/2019 75  38 - 126 U/L Final  . Total Bilirubin 11/25/2019 0.3  0.3 - 1.2 mg/dL Final  . GFR, Est Non Af Am 11/25/2019 >60  >60 mL/min Final  . GFR, Est AFR Am 11/25/2019 >60  >60 mL/min Final  . Anion gap 11/25/2019 10  5 - 15 Final   Performed at [facility], [place] [address] [place], [place] [address]  . WBC Count 11/25/2019 5.2  4.0 - 10.5 K/uL Final  . RBC 11/25/2019 4.67  3.87 - 5.11 MIL/uL Final  . Hemoglobin 11/25/2019 12.7  12.0 - 15.0 g/dL Final  . HCT 11/25/2019 40.3  36.0 - 46.0 % Final  . MCV 11/25/2019 86.3  80.0 - 100.0 fL Final  . MCH 11/25/2019 27.2  26.0 - 34.0 pg Final  . MCHC 11/25/2019 31.5  30.0 - 36.0 g/dL Final  .  RDW 11/25/2019 14.0  11.5 - 15.5 % Final  . Platelet Count 11/25/2019 296  150 - 400 K/uL Final  . nRBC 11/25/2019 0.0  0.0 - 0.2 % Final  . Neutrophils Relative % 11/25/2019 46  % Final  . Neutro Abs 11/25/2019 2.4  1.7 - 7.7 K/uL Final  . Lymphocytes Relative 11/25/2019 40  % Final  . Lymphs Abs 11/25/2019 2.1  0.7 - 4.0 K/uL Final  . Monocytes Relative 11/25/2019 9  % Final  . Monocytes Absolute 11/25/2019 0.5  0.1 - 1.0 K/uL Final  . Eosinophils Relative 11/25/2019  4  % Final  . Eosinophils Absolute 11/25/2019 0.2  0.0 - 0.5 K/uL Final  . Basophils Relative 11/25/2019 1  % Final  . Basophils Absolute 11/25/2019 0.1  0.0 - 0.1 K/uL Final  . Immature Granulocytes 11/25/2019 0  % Final  . Abs Immature Granulocytes 11/25/2019 0.02  0.00 - 0.07 K/uL Final   Performed at [facility], [place] [address] [place], [place] [address]    (this displays the last labs from the last 3 days)  No results found for: TOTALPROTELP, ALBUMINELP, A1GS, A2GS, BETS, BETA2SER, GAMS, MSPIKE, SPEI (this displays SPEP labs)  No results found for: KPAFRELGTCHN, LAMBDASER, KAPLAMBRATIO (kappa/lambda light chains)  No results found for: HGBA, HGBA2QUANT, HGBFQUANT, HGBSQUAN (Hemoglobinopathy evaluation)   No results found for: LDH  No results found for: IRON, TIBC, IRONPCTSAT (Iron and TIBC)  No results found for: FERRITIN  Urinalysis    Component Value Date/Time   COLORURINE YELLOW 10/17/2019 1112   APPEARANCEUR HAZY (A) 10/17/2019 1112   LABSPEC 1.011 10/17/2019 1112   PHURINE 7.0 10/17/2019 1112   GLUCOSEU 50 (A) 10/17/2019 1112   HGBUR NEGATIVE 10/17/2019 1112   BILIRUBINUR NEGATIVE 10/17/2019 1112   KETONESUR NEGATIVE 10/17/2019 1112   PROTEINUR NEGATIVE 10/17/2019 1112   NITRITE NEGATIVE 10/17/2019 1112   LEUKOCYTESUR NEGATIVE 10/17/2019 1112     STUDIES:  No results found.   ELIGIBLE FOR AVAILABLE RESEARCH PROTOCOL: UPBEAT  ASSESSMENT: 64 y.o. [place], [place] woman status post right breast upper outer quadrant biopsy 03/11/2019 for a clinical t1b N0, stage IA invasive ductal carcinoma, grade 2, estrogen and progesterone receptor negative, HER-2 amplified, with an MIB-1 of 10%.  (1) genetics testing 03/30/2019: declined by patient  (2) Right breast lumpectomy on 05/29/2019: pT1b pN0, stage IA invasive ductal carciinoma, grade 2,   (a) a total of 5 lymph nodes were negative for carcinoma  (b) superior margin positive, cleared  with addition surgery on 06/10/2019   (c) surgery delayed due to abnormal EKG, patient cleared by Dr. Gwenlyn Found on 04/21/2019  (3) adjuvant chemotherapy consisting of Abraxane weekly x12 with Trastuzumab, starting 07/10/2019, completed 09/24/2019  (a) Abraxane given due to diabetes, and inability to tolerate steroid premedication required with Paclitaxel.  (4) trastuzumab to be continued to complete 1 year (through November 2021  (a) baseline echo 04/06/2019 shows an ejection fraction greater than 65%  (b) echo on 07/22/2019 shows EF 65-70%  (c) echo 12/02/2019  (5) adjuvant radiation underway beginning 11/23/2019   PLAN: Kalijah continues on her maintenance Trastuzumab with good tolerance.  She has no sign of breast cancer recurrence.  She is doing well. Since Laraya is blind, she depends on her family to bring her to appointments.  While she is undergoing radiation therapy, she will have her echocardiogram at [facility], and do a virtual visit with Dr. Haroldine Laws.  In July, she will go to [facility] and do  an in person visit with Dr. Haroldine Laws.    She is also undergoing adjuvant radiation therapy and is tolerating this well.    I Khan Shantae my deepest condolences regarding the loss of her son.  Her loss is unimaginable.    I recommended healthy diet, and for her to remain active.  We will see her back in 3 weeks for labs, f/u, and her next trastuzumab.  She was recommended to continue with the appropriate pandemic precautions. She knows to call for any questions that may arise between now and her next appointment.  We are happy to see her sooner if needed.   Total encounter time 20 minutes.Wilber Bihari, NP 11/26/19 4:37 AM Medical Oncology and Hematology [facility] [place], [place] [address] Tel. [phone]    Fax. [phone]    *Total Encounter Time as defined by the Centers for Medicare and Medicaid Services includes, in addition to the face-to-face  time of a patient visit (documented in the note above) non-face-to-face time: obtaining and reviewing outside history, ordering and reviewing medications, tests or procedures, care coordination (communications with other health care professionals or caregivers) and documentation in the medical record.

## 2019-11-25 NOTE — Telephone Encounter (Signed)
Per Jonna Clark NP called and rescheduled patient echo [phone]) for Wednesday 12/02/19 @9  after radiation appointment. Mendel Ryder NP and patient made aware.

## 2019-11-25 NOTE — Patient Instructions (Signed)
[facility] Discharge Instructions for Patients Receiving Chemotherapy  Today you received the following chemotherapy agents [id number]   To help prevent nausea and vomiting after your treatment, we encourage you to take your nausea medication as directed.   If you develop nausea and vomiting that is not controlled by your nausea medication, call the clinic.   BELOW ARE SYMPTOMS THAT SHOULD BE REPORTED IMMEDIATELY:  *FEVER GREATER THAN 100.5 F  *CHILLS WITH OR WITHOUT FEVER  NAUSEA AND VOMITING THAT IS NOT CONTROLLED WITH YOUR NAUSEA MEDICATION  *UNUSUAL SHORTNESS OF BREATH  *UNUSUAL BRUISING OR BLEEDING  TENDERNESS IN MOUTH AND THROAT WITH OR WITHOUT PRESENCE OF ULCERS  *URINARY PROBLEMS  *BOWEL PROBLEMS  UNUSUAL RASH Items with * indicate a potential emergency and should be followed up as soon as possible.  Feel free to call the clinic should you have any questions or concerns. The clinic phone number is [phone].  Please show the CHEMO ALERT CARD at check-in to the Emergency Department and triage nurse.   

## 2019-11-25 NOTE — Patient Instructions (Signed)
Potassium Content of Foods  Potassium is a mineral found in many foods and drinks. It affects how the heart works, and helps keep fluids and minerals balanced in the body. The amount of potassium you need each day depends on your age and any medical conditions you may have. Talk to your health care provider or dietitian about how much potassium you need. The following lists of foods provide the general serving size for foods and the approximate amount of potassium in each serving, listed in milligrams (mg). Actual values may vary depending on the product and how it is processed. High in potassium The following foods and beverages have 200 mg or more of potassium per serving:  Apricots (raw) - 2 have 200 mg of potassium.  Apricots (dry) - 5 have 200 mg of potassium.  Artichoke - 1 medium has 345 mg of potassium.  Avocado -  fruit has 245 mg of potassium.  Banana - 1 medium fruit has 425 mg of potassium.  Lima or baked beans (canned) -  cup has 280 mg of potassium.  White beans (canned) -  cup has 595 mg potassium.  Beef roast - 3 oz has 320 mg of potassium.  Ground beef - 3 oz has 270 mg of potassium.  Beets (raw or cooked) -  cup has 260 mg of potassium.  Bran muffin - 2 oz has 300 mg of potassium.  Broccoli (cooked) -  cup has 230 mg of potassium.  Brussels sprouts -  cup has 250 mg of potassium.  Cantaloupe -  cup has 215 mg of potassium.  Cereal, 100% bran -  cup has 200-400 mg of potassium.  Cheeseburger -1 single fast food burger has 225-400 mg of potassium.  Chicken - 3 oz has 220 mg of potassium.  Clams (canned) - 3 oz has 535 mg of potassium.  Crab - 3 oz has 225 mg of potassium.  Dates - 5 have 270 mg of potassium.  Dried beans and peas -  cup has 300-475 mg of potassium.  Figs (dried) - 2 have 260 mg of potassium.  Fish (halibut, tuna, cod, snapper) - 3 oz has 480 mg of potassium.  Fish (salmon, haddock, swordfish, perch) - 3 oz has 300 mg of  potassium.  Fish (tuna, canned) - 3 oz has 200 mg of potassium.  French fries (fast food) - 3 oz has 470 mg of potassium.  Granola with fruit and nuts -  cup has 200 mg of potassium.  Grapefruit juice -  cup has 200 mg of potassium.  Honeydew melon -  cup has 200 mg of potassium.  Kale (raw) - 1 cup has 300 mg of potassium.  Kiwi - 1 medium fruit has 240 mg of potassium.  Kohlrabi, rutabaga, parsnips -  cup has 280 mg of potassium.  Lentils -  cup has 365 mg of potassium.  Mango - 1 each has 325 mg of potassium.  Milk (nonfat, low-fat, whole, buttermilk) - 1 cup has 350-380 mg of potassium.  Milk (chocolate) - 1 cup has 420 mg of potassium  Molasses - 1 Tbsp has 295 mg of potassium.  Mushrooms -  cup has 280 mg of potassium.  Nectarine - 1 each has 275 mg of potassium.  Nuts (almonds, peanuts, hazelnuts, [place], cashew, mixed) - 1 oz has 200 mg of potassium.  Nuts (pistachios) - 1 oz has 295 mg of potassium.  Orange - 1 fruit has 240 mg of potassium.  Orange juice -    cup has 235 mg of potassium.  Papaya -  medium fruit has 390 mg of potassium.  Peanut butter (chunky) - 2 Tbsp has 240 mg of potassium.  Peanut butter (smooth) - 2 Tbsp has 210 mg of potassium.  Pear - 1 medium (200 mg) of potassium.  Pomegranate - 1 whole fruit has 400 mg of potassium.  Pomegranate juice -  cup has 215 mg of potassium.  Pork - 3 oz has 350 mg of potassium.  Potato chips (salted) - 1 oz has 465 mg of potassium.  Potato (baked with skin) - 1 medium has 925 mg of potassium.  Potato (boiled) -  cup has 255 mg of potassium.  Potato (Mashed) -  cup has 330 mg of potassium.  Prune juice -  cup has 370 mg of potassium.  Prunes - 5 have 305 mg of potassium.  Pudding (chocolate) -  cup has 230 mg of potassium.  Pumpkin (canned) -  cup has 250 mg of potassium.  Raisins (seedless) -  cup has 270 mg of potassium.  Seeds (sunflower or pumpkin) - 1 oz has 240 mg of  potassium.  Soy milk - 1 cup has 300 mg of potassium.  Spinach (cooked) - 1/2 cup has 420 mg of potassium.  Spinach (canned) -  cup has 370 mg of potassium.  Sweet potato (baked with skin) - 1 medium has 450 mg of potassium.  Swiss chard -  cup has 480 mg of potassium.  Tomato or vegetable juice -  cup has 275 mg of potassium.  Tomato (sauce or puree) -  cup has 400-550 mg of potassium.  Tomato (raw) - 1 medium has 290 mg of potassium.  Tomato (canned) -  cup has 200-300 mg of potassium.  Turkey - 3 oz has 250 mg of potassium.  Wheat germ - 1 oz has 250 mg of potassium.  Winter squash -  cup has 250 mg of potassium.  Yogurt (plain or fruited) - 6 oz has 260-435 mg of potassium.  Zucchini -  cup has 220 mg of potassium. Moderate in potassium The following foods and beverages have 50-200 mg of potassium per serving:  Apple - 1 fruit has 150 mg of potassium  Apple juice -  cup has 150 mg of potassium  Applesauce -  cup has 90 mg of potassium  Apricot nectar -  cup has 140 mg of potassium  Asparagus (small spears) -  cup has 155 mg of potassium  Asparagus (large spears) - 6 have 155 mg of potassium  Bagel (cinnamon raisin) - 1 four-inch bagel has 130 mg of potassium  Bagel (egg or plain) - 1 four- inch bagel has 70 mg of potassium  Beans (green) -  cup has 90 mg of potassium  Beans (yellow) -  cup has 190 mg of potassium  Beer, regular - 12 oz has 100 mg of potassium  Beets (canned) -  cup has 125 mg of potassium  Blackberries -  cup has 115 mg of potassium  Blueberries -  cup has 60 mg of potassium  Bread (whole wheat) - 1 slice has 70 mg of potassium  Broccoli (raw) -  cup has 145 mg of potassium  Cabbage -  cup has 150 mg of potassium  Carrots (cooked or raw) -  cup has 180 mg of potassium  Cauliflower (raw) -  cup has 150 mg of potassium  Celery (raw) -  cup has 155 mg of potassium  Cereal, bran   flakes -  cup has 120-150 mg  of potassium  Cheese (cottage) -  cup has 110 mg of potassium  Cherries - 10 have 150 mg of potassium  Chocolate - 1 oz bar has 165 mg of potassium  Coffee (brewed) - 6 oz has 90 mg of potassium  Corn -  cup or 1 ear has 195 mg of potassium  Cucumbers -  cup has 80 mg of potassium  Egg - 1 large egg has 60 mg of potassium  Eggplant -  cup has 60 mg of potassium  Endive (raw) -  cup has 80 mg of potassium  English muffin - 1 has 65 mg of potassium  Fish (ocean perch) - 3 oz has 192 mg of potassium  Frankfurter, beef or pork - 1 has 75 mg of potassium  Fruit cocktail -  cup has 115 mg of potassium  Grape juice -  cup has 170 mg of potassium  Grapefruit -  fruit has 175 mg of potassium  Grapes -  cup has 155 mg of potassium  Greens: kale, turnip, collard -  cup has 110-150 mg of potassium  Ice cream or frozen yogurt (chocolate) -  cup has 175 mg of potassium  Ice cream or frozen yogurt (vanilla) -  cup has 120-150 mg of potassium  Lemons, limes - 1 each has 80 mg of potassium  Lettuce - 1 cup has 100 mg of potassium  Mixed vegetables -  cup has 150 mg of potassium  Mushrooms, raw -  cup has 110 mg of potassium  Nuts (walnuts, pecans, or macadamia) - 1 oz has 125 mg of potassium  Oatmeal -  cup has 80 mg of potassium  Okra -  cup has 110 mg of potassium  Onions -  cup has 120 mg of potassium  Peach - 1 has 185 mg of potassium  Peaches (canned) -  cup has 120 mg of potassium  Pears (canned) -  cup has 120 mg of potassium  Peas, green (frozen) -  cup has 90 mg of potassium  Peppers (Green) -  cup has 130 mg of potassium  Peppers (Red) -  cup has 160 mg of potassium  Pineapple juice -  cup has 165 mg of potassium  Pineapple (fresh or canned) -  cup has 100 mg of potassium  Plums - 1 has 105 mg of potassium  Pudding, vanilla -  cup has 150 mg of potassium  Raspberries -  cup has 90 mg of potassium  Rhubarb -  cup has  115 mg of potassium  Rice, wild -  cup has 80 mg of potassium  Shrimp - 3 oz has 155 mg of potassium  Spinach (raw) - 1 cup has 170 mg of potassium  Strawberries -  cup has 125 mg of potassium  Summer squash -  cup has 175-200 mg of potassium  Swiss chard (raw) - 1 cup has 135 mg of potassium  Tangerines - 1 fruit has 140 mg of potassium  Tea, brewed - 6 oz has 65 mg of potassium  Turnips -  cup has 140 mg of potassium  Watermelon -  cup has 85 mg of potassium  Wine (Red, table) - 5 oz has 180 mg of potassium  Wine (White, table) - 5 oz 100 mg of potassium Low in potassium The following foods and beverages have less than 50 mg of potassium per serving.  Bread (white) - 1 slice has 30 mg of potassium    Carbonated beverages - 12 oz has less than 5 mg of potassium  Cheese - 1 oz has 20-30 mg of potassium  Cranberries -  cup has 45 mg of potassium  Cranberry juice cocktail -  cup has 20 mg of potassium  Fats and oils - 1 Tbsp has less than 5 mg of potassium  Hummus - 1 Tbsp has 32 mg of potassium  Nectar (papaya, mango, or pear) -  cup has 35 mg of potassium  Rice (white or brown) -  cup has 50 mg of potassium  Spaghetti or macaroni (cooked) -  cup has 30 mg of potassium  Tortilla, flour or corn - 1 has 50 mg of potassium  Waffle - 1 four-inch waffle has 50 mg of potassium  Water chestnuts -  cup has 40 mg of potassium Summary  Potassium is a mineral found in many foods and drinks. It affects how the heart works, and helps keep fluids and minerals balanced in the body.  The amount of potassium you need each day depends on your age and any existing medical conditions you may have. Your health care provider or dietitian may recommend an amount of potassium that you should have each day. This information is not intended to replace advice given to you by your health care provider. Make sure you discuss any questions you have with your health care  provider. Document Revised: 07/12/2017 Document Reviewed: 10/24/2016 Elsevier Patient Education  2020 Elsevier Inc.   

## 2019-11-26 ENCOUNTER — Other Ambulatory Visit: Payer: Self-pay

## 2019-11-26 ENCOUNTER — Telehealth: Payer: Self-pay | Admitting: Adult Health

## 2019-11-26 ENCOUNTER — Other Ambulatory Visit: Payer: Medicare Other

## 2019-11-26 ENCOUNTER — Ambulatory Visit: Payer: Medicare Other

## 2019-11-26 ENCOUNTER — Ambulatory Visit
Admission: RE | Admit: 2019-11-26 | Discharge: 2019-11-26 | Disposition: A | Discharge status: Home / Self Care | Payer: Medicare Other | Source: Ambulatory Visit | Attending: Radiation Oncology | Admitting: Radiation Oncology

## 2019-11-26 DIAGNOSIS — C50411 Malignant neoplasm of upper-outer quadrant of right female breast: Secondary | ICD-10-CM | POA: Diagnosis not present

## 2019-11-26 NOTE — Telephone Encounter (Signed)
No 4/14 los. No changes made to pt's schedule.  

## 2019-11-27 ENCOUNTER — Other Ambulatory Visit: Payer: Self-pay

## 2019-11-27 ENCOUNTER — Ambulatory Visit: Payer: Medicare Other

## 2019-11-27 ENCOUNTER — Ambulatory Visit: Payer: Medicare Other | Admitting: Radiation Oncology

## 2019-11-27 ENCOUNTER — Ambulatory Visit
Admission: RE | Admit: 2019-11-27 | Discharge: 2019-11-27 | Disposition: A | Discharge status: Home / Self Care | Payer: Medicare Other | Source: Ambulatory Visit | Attending: Radiation Oncology | Admitting: Radiation Oncology

## 2019-11-27 DIAGNOSIS — C50411 Malignant neoplasm of upper-outer quadrant of right female breast: Secondary | ICD-10-CM | POA: Diagnosis not present

## 2019-11-27 DIAGNOSIS — Z171 Estrogen receptor negative status [ER-]: Secondary | ICD-10-CM

## 2019-11-27 MED ORDER — SONAFINE EX EMUL
1.0000 "application " | Freq: Once | CUTANEOUS | Status: AC
  Administered 2019-11-27: 1 via TOPICAL

## 2019-11-27 MED ORDER — ALRA NON-METALLIC DEODORANT (RAD-ONC)
1.0000 "application " | Freq: Once | TOPICAL | Status: AC
  Administered 2019-11-27: 1 via TOPICAL

## 2019-11-27 NOTE — Progress Notes (Signed)
Pt here for patient teaching.  Pt given Radiation and You booklet, skin care instructions, Alra deodorant and Sonafine.  Reviewed areas of pertinence such as fatigue, hair loss, skin changes, breast tenderness and breast swelling . Pt able to give teach back of to pat skin,apply Sonafine bid, avoid applying anything to skin within 4 hours of treatment, avoid wearing an under wire bra and to use an electric razor if they must shave. Pt verbalizes understanding of information given and will contact nursing with any questions or concerns.  Son present for education.  Gloriajean Dell. Leonie Green, BSN

## 2019-11-30 ENCOUNTER — Ambulatory Visit: Payer: Medicare Other

## 2019-11-30 ENCOUNTER — Ambulatory Visit
Admission: RE | Admit: 2019-11-30 | Discharge: 2019-11-30 | Disposition: A | Discharge status: Home / Self Care | Payer: Medicare Other | Source: Ambulatory Visit | Attending: Radiation Oncology | Admitting: Radiation Oncology

## 2019-11-30 ENCOUNTER — Other Ambulatory Visit: Payer: Self-pay

## 2019-11-30 DIAGNOSIS — C50411 Malignant neoplasm of upper-outer quadrant of right female breast: Secondary | ICD-10-CM | POA: Diagnosis not present

## 2019-12-01 ENCOUNTER — Other Ambulatory Visit: Payer: Self-pay

## 2019-12-01 ENCOUNTER — Ambulatory Visit: Payer: Medicare Other

## 2019-12-01 ENCOUNTER — Ambulatory Visit
Admission: RE | Admit: 2019-12-01 | Discharge: 2019-12-01 | Disposition: A | Discharge status: Home / Self Care | Payer: Medicare Other | Source: Ambulatory Visit | Attending: Radiation Oncology | Admitting: Radiation Oncology

## 2019-12-01 ENCOUNTER — Telehealth: Payer: Self-pay | Admitting: Family Medicine

## 2019-12-01 DIAGNOSIS — C50411 Malignant neoplasm of upper-outer quadrant of right female breast: Secondary | ICD-10-CM | POA: Diagnosis not present

## 2019-12-01 NOTE — Telephone Encounter (Signed)
Patient would like for Dr. Criss Rosales to write a letter to [facility] stating that she still needs to have her in home aid. She said she has been extremely helpful since the loss of Jonathon.   Patients sister will pick letter up at the front desk. Please call patient when completed and ready to be picked up.

## 2019-12-01 NOTE — Telephone Encounter (Signed)
Patients sister came into office to get her letter and pt's sister stated that the letter needs to state that pt needs help with getting in and out of bath,getting dressed, putting clothes on correctly, and sometimes getting up out of bed due to loss of family member. Please give pt's sister Kenney Houseman at [phone].

## 2019-12-01 NOTE — Telephone Encounter (Signed)
Contacted pt and let her know that I printed the letter written by Dr. Criss Rosales and it would be up front for her sister to pick up.Selwyn Reason Zimmerman Rumple, CMA

## 2019-12-01 NOTE — Telephone Encounter (Signed)
Letter written this patient has requested, plan inpatient in the hospital so please print, place at the front desk, and call her and tell her that her sister can come pick it up  -Dr. Criss Rosales

## 2019-12-02 ENCOUNTER — Ambulatory Visit (HOSPITAL_COMMUNITY)
Admission: RE | Admit: 2019-12-02 | Discharge: 2019-12-02 | Disposition: A | Discharge status: Home / Self Care | Payer: Medicare Other | Source: Ambulatory Visit | Attending: Adult Health | Admitting: Adult Health

## 2019-12-02 ENCOUNTER — Ambulatory Visit
Admission: RE | Admit: 2019-12-02 | Discharge: 2019-12-02 | Disposition: A | Discharge status: Home / Self Care | Payer: Medicare Other | Source: Ambulatory Visit | Attending: Radiation Oncology | Admitting: Radiation Oncology

## 2019-12-02 ENCOUNTER — Other Ambulatory Visit: Payer: Self-pay

## 2019-12-02 ENCOUNTER — Ambulatory Visit: Payer: Medicare Other

## 2019-12-02 DIAGNOSIS — Z01818 Encounter for other preprocedural examination: Secondary | ICD-10-CM | POA: Diagnosis not present

## 2019-12-02 DIAGNOSIS — Z87891 Personal history of nicotine dependence: Secondary | ICD-10-CM | POA: Diagnosis not present

## 2019-12-02 DIAGNOSIS — I517 Cardiomegaly: Secondary | ICD-10-CM | POA: Diagnosis not present

## 2019-12-02 DIAGNOSIS — E119 Type 2 diabetes mellitus without complications: Secondary | ICD-10-CM | POA: Diagnosis not present

## 2019-12-02 DIAGNOSIS — Z171 Estrogen receptor negative status [ER-]: Secondary | ICD-10-CM | POA: Insufficient documentation

## 2019-12-02 DIAGNOSIS — C50411 Malignant neoplasm of upper-outer quadrant of right female breast: Secondary | ICD-10-CM | POA: Insufficient documentation

## 2019-12-02 DIAGNOSIS — E785 Hyperlipidemia, unspecified: Secondary | ICD-10-CM | POA: Diagnosis not present

## 2019-12-02 NOTE — Progress Notes (Signed)
  Echocardiogram 2D Echocardiogram has been performed.  Megan Khan 12/02/2019, 10:00 AM

## 2019-12-03 ENCOUNTER — Ambulatory Visit
Admission: RE | Admit: 2019-12-03 | Discharge: 2019-12-03 | Disposition: A | Discharge status: Home / Self Care | Payer: Medicare Other | Source: Ambulatory Visit | Attending: Radiation Oncology | Admitting: Radiation Oncology

## 2019-12-03 ENCOUNTER — Telehealth: Payer: Self-pay | Admitting: *Deleted

## 2019-12-03 ENCOUNTER — Ambulatory Visit: Payer: Medicare Other

## 2019-12-03 ENCOUNTER — Other Ambulatory Visit: Payer: Self-pay

## 2019-12-03 ENCOUNTER — Encounter: Payer: Self-pay | Admitting: *Deleted

## 2019-12-03 DIAGNOSIS — C50411 Malignant neoplasm of upper-outer quadrant of right female breast: Secondary | ICD-10-CM | POA: Diagnosis not present

## 2019-12-03 NOTE — Telephone Encounter (Signed)
Called patient's sister, new letter written.   If she needs another version later, let me know  -Dr. Brityn Mastrogiovanni

## 2019-12-03 NOTE — Telephone Encounter (Signed)
This RN spoke with the pt's sister and care giver, [name], per her call stating need for letter of medical necessity for reinstating a personal care aide for [place].  Of note the patient is legally blind - and her son who was living with her prior to his death was helping her.  Family is helping now as best a possible but a personal aide would give more consistent and needed help.  Letter will be done.  [name] states she will pick up when available.

## 2019-12-04 ENCOUNTER — Ambulatory Visit
Admission: RE | Admit: 2019-12-04 | Discharge: 2019-12-04 | Disposition: A | Discharge status: Home / Self Care | Payer: Medicare Other | Source: Ambulatory Visit | Attending: Radiation Oncology | Admitting: Radiation Oncology

## 2019-12-04 ENCOUNTER — Ambulatory Visit: Payer: Medicare Other

## 2019-12-04 ENCOUNTER — Other Ambulatory Visit: Payer: Self-pay

## 2019-12-04 ENCOUNTER — Telehealth: Payer: Self-pay | Admitting: *Deleted

## 2019-12-04 DIAGNOSIS — C50411 Malignant neoplasm of upper-outer quadrant of right female breast: Secondary | ICD-10-CM | POA: Diagnosis not present

## 2019-12-04 NOTE — Telephone Encounter (Signed)
Pt's sister called and informed letter she requested for medical necessity for [facility] per medicaid is available for pickup from the front desk.

## 2019-12-07 ENCOUNTER — Other Ambulatory Visit: Payer: Self-pay

## 2019-12-07 ENCOUNTER — Ambulatory Visit
Admission: RE | Admit: 2019-12-07 | Discharge: 2019-12-07 | Disposition: A | Discharge status: Home / Self Care | Payer: Medicare Other | Source: Ambulatory Visit | Attending: Radiation Oncology | Admitting: Radiation Oncology

## 2019-12-07 ENCOUNTER — Ambulatory Visit (HOSPITAL_COMMUNITY)
Admission: RE | Admit: 2019-12-07 | Discharge: 2019-12-07 | Disposition: A | Discharge status: Home / Self Care | Payer: Medicare Other | Source: Ambulatory Visit | Attending: Internal Medicine | Admitting: Internal Medicine

## 2019-12-07 DIAGNOSIS — Z171 Estrogen receptor negative status [ER-]: Secondary | ICD-10-CM

## 2019-12-07 DIAGNOSIS — C50411 Malignant neoplasm of upper-outer quadrant of right female breast: Secondary | ICD-10-CM

## 2019-12-07 DIAGNOSIS — I10 Essential (primary) hypertension: Secondary | ICD-10-CM

## 2019-12-07 MED ORDER — LISINOPRIL-HYDROCHLOROTHIAZIDE 20-12.5 MG PO TABS: 2.0000 | ORAL_TABLET | Freq: Every day | ORAL | 6 refills | Status: DC

## 2019-12-07 NOTE — Addendum Note (Signed)
Encounter addended by: Scarlette Calico, RN on: 12/07/2019 4:35 PM  Actions taken: Medication long-term status modified, Pharmacy for encounter modified, Order list changed, Diagnosis association updated, Clinical Note Signed

## 2019-12-07 NOTE — Progress Notes (Signed)
Heart Failure TeleHealth Note  Due to national recommendations of social distancing due to [place] 19, Audio/video telehealth visit is felt to be most appropriate for this patient at this time.  See MyChart message from today for patient consent regarding telehealth for [facility]. The patient was identified personally using two identifiers.   Date:  12/07/2019   ID:  Megan Khan, DOB 07/23/1956, MRN [id number]  Location: Home  Provider location: [facility] Type of Visit: Established patient  PCP:  [name], DO  Cardiologist:  No primary care provider on file. Primary HF: Shanyla Marconi  Chief Complaint: Heart Failure follow-up   History of Present Illness:  HPI:  [name] is a  64 y.o. female with right breast cancer referred by Dr. Jana Hakim for enrollment into the Cardio-Oncology program.  She has a h/o HTN, HL, DM2, glaucoma with legal blindness. Denied any known heart disease. Pre-chemo echo 04/06/2019  EF 65-70%.   Cancer history:   Underwent right breast upper outer quadrant biopsy 03/11/2019 for a clinical t1b N0, stage IA invasive ductal carcinoma, grade 2, estrogen and progesterone receptor negative, HER-2 amplified, with an MIB-1 of 10%.  (1) Right breast lumpectomy on 05/29/2019: pT1b pN0, stage IA invasive ductal carciinoma, grade 2,  (a) a total of 5 lymph nodes were negative for carcinoma (b) superior margin positive, cleared with addition surgery on 06/10/2019  (c) surgery delayed due to abnormal EKG, patient cleared by Dr. Gwenlyn Found on 04/21/2019  (2) adjuvant chemotherapy to consist of Abraxane weekly x12 with Trastuzumab, starting 07/10/2019 (a) Abraxane given due to diabetes, and inability to tolerate steroid premedication required with Paclitaxel.  (3) trastuzumab to be continued to complete 1 year (a) baseline echo 04/06/2019 shows an ejection fraction  greater than 65%  (4) adjuvant radiation to follow.  Echo 07/22/19: EF 65-70% Mild MR. Grade 1 DD GLS -16.7%  Echo 12/02/19: EF 60-65% GLS -14.3% (underestimated) Grade 1 DD Personally reviewed  She presents via audio/video conferencing for a telehealth visit today. She is getting Herceptin every 3 weeks. Also getting XRT for 2 more weeks. Denies SOB, orthopnea or PND.  SBP 130-150 at [facility]. Remains sad over death of her 15 year old son with cerebral palsy and morbid obesity (430 pounds).   ADRIONNA DELCID denies symptoms worrisome for COVID 19.   Past Medical History:  Diagnosis Date  . Anemia    during pregnancy  . Cancer [facility]) 2020   breast cancer on right  . Diabetes mellitus    Type 2  . Family history of breast cancer   . Family history of throat cancer   . Glaucoma   . Glaucoma   . Hx of colonic polyps   . Hyperlipidemia   . Hypertension   . Legally blind    completely blind in left eye, low vision in right   Past Surgical History:  Procedure Laterality Date  . BREAST LUMPECTOMY WITH RADIOACTIVE SEED AND SENTINEL LYMPH NODE BIOPSY Right 05/29/2019   Procedure: RIGHT BREAST LUMPECTOMY WITH RADIOACTIVE SEED AND RIGHT DEEP AXILLARY SENTINEL LYMPH NODE BIOPSY INJECT BLUE DYE;  Surgeon: Fanny Skates, MD;  Location: [place];  Service: General;  Laterality: Right;  . COLONOSCOPY  01/22/2012   Procedure: COLONOSCOPY;  Surgeon: [name], MD;  Location: [facility];  Service: Endoscopy;  Laterality: N/A;  . COLONOSCOPY    . COLONOSCOPY    . PORTACATH PLACEMENT N/A 05/29/2019   Procedure: INSERTION PORT-A-CATH WITH  ULTRASOUND;  Surgeon: Fanny Skates, MD;  Location: [place];  Service: General;  Laterality: N/A;  . RE-EXCISION OF BREAST LUMPECTOMY Right 06/10/2019   Procedure: RE-EXCISION OF RIGHT BREAST LUMPECTOMY MARGINS;  Surgeon: Fanny Skates, MD;  Location: [place];  Service: General;  Laterality: Right;  . TUBAL LIGATION       Current Outpatient  Medications  Medication Sig Dispense Refill  . amLODipine (NORVASC) 10 MG tablet TAKE 1 TABLET(10 MG) BY MOUTH DAILY (Patient taking differently: Take 10 mg by mouth daily. ) 90 tablet 3  . bimatoprost (LUMIGAN) 0.01 % SOLN Place 2 drops into the left eye at bedtime.     . blood glucose meter kit and supplies KIT Dispense based on patient and insurance preference. Use up to four times daily as directed. (FOR ICD-9 250.00, 250.01). 1 each 12  . Blood Glucose Monitoring Suppl (PRODIGY AUTOCODE BLOOD GLUCOSE) w/Device KIT 1 kit by Does not apply route in the morning, at noon, and at bedtime. Any brand acceptable if it works for patient 1 kit 0  . dorzolamide (TRUSOPT) 2 % ophthalmic solution Place 2 drops into the left eye 3 (three) times daily.     Marland Kitchen gabapentin (NEURONTIN) 100 MG capsule TAKE 2 CAPSULES(200 MG) BY MOUTH THREE TIMES DAILY 180 capsule 0  . Glucose Blood (BLOOD GLUCOSE TEST STRIPS) STRP 1 strip by In Vitro route 3 (three) times daily. 270 strip 3  . lidocaine-prilocaine (EMLA) cream Apply to affected area once (Patient taking differently: Apply 1 application topically daily as needed. Port: Apply to affected area once) 30 g 3  . lisinopril-hydrochlorothiazide (ZESTORETIC) 20-25 MG tablet Take 1 tablet by mouth daily. 90 tablet 3  . lovastatin (MEVACOR) 20 MG tablet Take 1 tablet (20 mg total) by mouth at bedtime. 90 tablet 3  . magic mouthwash SOLN Take 5 mLs by mouth 3 (three) times daily as needed for mouth pain.     . metFORMIN (GLUCOPHAGE) 1000 MG tablet Take 1 tablet (1,000 mg total) by mouth 2 (two) times daily with a meal. 360 tablet 1  . metoprolol succinate (TOPROL-XL) 50 MG 24 hr tablet TAKE 1 TABLET(50 MG) BY MOUTH DAILY (Patient taking differently: Take 50 mg by mouth daily. ) 90 tablet 0  . prochlorperazine (COMPAZINE) 10 MG tablet TAKE 1 TABLET(10 MG) BY MOUTH EVERY 6 HOURS AS NEEDED FOR NAUSEA OR VOMITING (Patient taking differently: Take 10 mg by mouth every 8 (eight) hours  as needed for nausea or vomiting. ) 30 tablet 1  . sodium chloride (OCEAN) 0.65 % SOLN nasal spray Place 1 spray into both nostrils as needed for congestion. 30 mL 0   No current facility-administered medications for this encounter.    Allergies:   Sulfamethoxazole   Social History:  The patient  reports that she quit smoking about 24 years ago. Her smoking use included cigarettes. She has never used smokeless tobacco. She reports that she does not drink alcohol or use drugs.   Family History:  The patient's family history includes Breast cancer in her cousin; Breast cancer (age of onset: 52) in her sister.   ROS:  Please see the history of present illness.   All other systems are personally reviewed and negative.   Exam:  (Video/Tele Health Call; Exam is subjective and or/visual.) General:  Speaks in full sentences. No resp difficulty. Lungs: Normal respiratory effort with conversation.  Abdomen: Non-distended per patient report Extremities: Pt denies edema. Neuro: Alert & oriented x 3.  Recent Labs: 10/18/2019: Magnesium 2.1 11/25/2019: ALT 21; BUN 12; Creatinine 0.77; Hemoglobin 12.7; Platelet Count 296; Potassium 3.3; Sodium 141  Personally reviewed   Wt Readings from Last 3 Encounters:  11/25/19 70.6 kg (155 lb 11.2 oz)  10/17/19 71.7 kg (158 lb)  10/15/19 71.9 kg (158 lb 8 oz)      ASSESSMENT AND PLAN:  1. Right breast cancer  - Echo 12/02/19 EF 60-65%  - I reviewed echos personally. EF and Doppler parameters stable. No HF on exam. Continue Herceptin.  - Repeat echo in 3 months (Finishes Herceptin 12/21)  2. HTN  - Blood pressure mildly elevated.  - Will change lisinopril/HCZ 20/25 to 40/25. ([facility])   COVID screen The patient does not have any symptoms that suggest any further testing/ screening at this time.  Social distancing reinforced today.  Recommended follow-up:  As above  Relevant cardiac medications were reviewed at length with  the patient today.   The patient does not have concerns regarding their medications at this time.   The following changes were made today:  As above  Today, I have spent 16 minutes with the patient with telehealth technology discussing the above issues .    Signed, Glori Bickers, MD  12/07/2019 3:24 PM  Advanced Heart Failure [place] [address] Heart and [place] [address] [phone] (office) [phone] (fax)

## 2019-12-07 NOTE — Patient Instructions (Signed)
Increase Lisinopril/HCTZ to 40/25 mg (2 tabs) daily, we have sent you in a prescription for 20/12.5 mg tabs so you will need to take 2 of them to equal your dose of 40/25 mg  Your physician recommends that you schedule a follow-up appointment in: 3 months with echocardiogram:

## 2019-12-07 NOTE — Progress Notes (Signed)
Orders per Dr Haroldine Laws:  1. Visit with echo in 3 months  2. Change lisinopril/HCZ 20/25 to 40/25. [employer] fast streamer at [facility])

## 2019-12-08 ENCOUNTER — Ambulatory Visit
Admission: RE | Admit: 2019-12-08 | Discharge: 2019-12-08 | Disposition: A | Discharge status: Home / Self Care | Payer: Medicare Other | Source: Ambulatory Visit | Attending: Radiation Oncology | Admitting: Radiation Oncology

## 2019-12-08 DIAGNOSIS — C50411 Malignant neoplasm of upper-outer quadrant of right female breast: Secondary | ICD-10-CM | POA: Diagnosis not present

## 2019-12-08 NOTE — Progress Notes (Signed)
Spoke w/pt via phone 12/08/19 she is aware and verbalizes understanding of medication change. F/u appt scheduled, AVS and appt card mailed to pt.

## 2019-12-08 NOTE — Addendum Note (Signed)
Encounter addended by: Scarlette Calico, RN on: 12/08/2019 12:19 PM  Actions taken: Clinical Note Signed

## 2019-12-09 ENCOUNTER — Other Ambulatory Visit: Payer: Self-pay

## 2019-12-09 ENCOUNTER — Ambulatory Visit
Admission: RE | Admit: 2019-12-09 | Discharge: 2019-12-09 | Disposition: A | Discharge status: Home / Self Care | Payer: Medicare Other | Source: Ambulatory Visit | Attending: Radiation Oncology | Admitting: Radiation Oncology

## 2019-12-09 DIAGNOSIS — C50411 Malignant neoplasm of upper-outer quadrant of right female breast: Secondary | ICD-10-CM | POA: Diagnosis not present

## 2019-12-10 ENCOUNTER — Other Ambulatory Visit: Payer: Self-pay

## 2019-12-10 ENCOUNTER — Ambulatory Visit
Admission: RE | Admit: 2019-12-10 | Discharge: 2019-12-10 | Disposition: A | Discharge status: Home / Self Care | Payer: Medicare Other | Source: Ambulatory Visit | Attending: Radiation Oncology | Admitting: Radiation Oncology

## 2019-12-10 DIAGNOSIS — C50411 Malignant neoplasm of upper-outer quadrant of right female breast: Secondary | ICD-10-CM | POA: Diagnosis not present

## 2019-12-11 ENCOUNTER — Ambulatory Visit: Payer: Medicare Other | Admitting: Radiation Oncology

## 2019-12-11 ENCOUNTER — Ambulatory Visit
Admission: RE | Admit: 2019-12-11 | Discharge: 2019-12-11 | Disposition: A | Discharge status: Home / Self Care | Payer: Medicare Other | Source: Ambulatory Visit | Attending: Radiation Oncology | Admitting: Radiation Oncology

## 2019-12-11 ENCOUNTER — Other Ambulatory Visit: Payer: Self-pay

## 2019-12-11 DIAGNOSIS — C50411 Malignant neoplasm of upper-outer quadrant of right female breast: Secondary | ICD-10-CM | POA: Diagnosis not present

## 2019-12-11 NOTE — Progress Notes (Signed)
Pharmacist Chemotherapy Monitoring - Follow Up Assessment    I verify that I have reviewed each item in the below checklist:  . Regimen for the patient is scheduled for the appropriate day and plan matches scheduled date. Marland Kitchen Appropriate non-routine labs are ordered dependent on drug ordered. . If applicable, additional medications reviewed and ordered per protocol based on lifetime cumulative doses and/or treatment regimen.   Plan for follow-up and/or issues identified: No . I-vent associated with next due treatment: No . MD and/or nursing notified: No  Philomena Course 12/11/2019 10:10 AM

## 2019-12-14 ENCOUNTER — Other Ambulatory Visit: Payer: Self-pay

## 2019-12-14 ENCOUNTER — Ambulatory Visit
Admission: RE | Admit: 2019-12-14 | Discharge: 2019-12-14 | Disposition: A | Discharge status: Home / Self Care | Payer: Medicare Other | Source: Ambulatory Visit | Attending: Radiation Oncology | Admitting: Radiation Oncology

## 2019-12-14 DIAGNOSIS — Z171 Estrogen receptor negative status [ER-]: Secondary | ICD-10-CM | POA: Diagnosis present

## 2019-12-14 DIAGNOSIS — C50411 Malignant neoplasm of upper-outer quadrant of right female breast: Secondary | ICD-10-CM | POA: Diagnosis not present

## 2019-12-15 ENCOUNTER — Ambulatory Visit
Admission: RE | Admit: 2019-12-15 | Discharge: 2019-12-15 | Disposition: A | Discharge status: Home / Self Care | Payer: Medicare Other | Source: Ambulatory Visit | Attending: Radiation Oncology | Admitting: Radiation Oncology

## 2019-12-15 ENCOUNTER — Ambulatory Visit: Payer: Self-pay | Admitting: Licensed Clinical Social Worker

## 2019-12-15 ENCOUNTER — Telehealth: Payer: Self-pay

## 2019-12-15 ENCOUNTER — Other Ambulatory Visit: Payer: Self-pay

## 2019-12-15 DIAGNOSIS — N181 Chronic kidney disease, stage 1: Secondary | ICD-10-CM

## 2019-12-15 DIAGNOSIS — C50411 Malignant neoplasm of upper-outer quadrant of right female breast: Secondary | ICD-10-CM | POA: Diagnosis not present

## 2019-12-15 DIAGNOSIS — E1122 Type 2 diabetes mellitus with diabetic chronic kidney disease: Secondary | ICD-10-CM

## 2019-12-15 NOTE — Chronic Care Management (AMB) (Signed)
   Social Work  Care Management Consultation  12/15/2019 Name: AARAVI HOSFORD MRN: [id number] DOB: 1956-03-14  KAMREIGH MOCARSKI is a 64 y.o. year old female who sees [name], DO for primary care. LCSW was consulted by patient via voice message for assistance with obtaining a Glucose meter. Patient was not interviewed or contacted during this encounter.     Recommendation:  patient may benefit from RN poole or pharmacy assisting with resolving this issue and concern as this is out of the scope for [facility] social work .   Intervention: message sent to RN Pool and pharmacy to assist patient.  Review of patient status, including review of consultants reports, relevant laboratory and other test results, and collaboration with appropriate care team members and the patient's provider was performed as part of comprehensive patient evaluation and provision of chronic care management services.    Plan:  1.The care management team is available to follow up with the patient , please consult with patient and place formal CCM referral if further intervention is needed.  2. No further follow up required by LCSW at this time  Casimer Lanius, [place] / [place]   [phone] 9:00 AM

## 2019-12-15 NOTE — Telephone Encounter (Signed)
Maurine Cane, LCSW  [facility] Team  Cc: [name], DO; Fleeger, Laban Emperor, CMA; Leavy Cella, RPH-CPP  This patient left a message on my voice mail yesterday about needing help with getting a Glucose meter. Not sure how she got my number as she called me in September about the same thing.   In Sept I forwarded a message to the RN clinic asking them to assist this patient as I don't do this   The patient is upset that it has taken so long and she still don't have it. Can someone please help her.   This was addressed in a separate encounter note on 4/8. My message stated I specifically spoke with the pharmacist about this and they were able to obtain for patient. The patient has not left any other messages regarding the issue and I assumed she received her supplies. Will call patient and the pharmacy to see how I can help.

## 2019-12-15 NOTE — Telephone Encounter (Signed)
Contacted patient. Patient stated the issue is the [employer]dig[employer] meter and test strips have been d/c. Patient was able to obtain 3 boxes of test strips from [facility] on [address], per 4/8 encounter note. Patient was calling LCSW to inform going forward her talking meter and strips to match will have to go through [employer] [facility].

## 2019-12-16 ENCOUNTER — Ambulatory Visit
Admission: RE | Admit: 2019-12-16 | Discharge: 2019-12-16 | Disposition: A | Discharge status: Home / Self Care | Payer: Medicare Other | Source: Ambulatory Visit | Attending: Radiation Oncology | Admitting: Radiation Oncology

## 2019-12-16 DIAGNOSIS — C50411 Malignant neoplasm of upper-outer quadrant of right female breast: Secondary | ICD-10-CM | POA: Diagnosis not present

## 2019-12-16 NOTE — Progress Notes (Signed)
Megan Khan  [phone] [phone]    ID: Megan Khan DOB: [birth year]  MR#: [id number]  CSN#:[id number]  Patient Care Team: Megan Sires, DO as PCP - General [name], Megan Pascal, MD as PCP - Advanced Heart Failure (Cardiology) [name], Megan Dad, MD as Consulting Physician (Oncology) Megan Rudd, MD as Consulting Physician (Radiation Oncology) [name], Megan Khan, DPM as Consulting Physician (Podiatry) Megan Pearson, MD as Consulting Physician (Ophthalmology) Megan Horner, MD as Consulting Physician (Gastroenterology) Megan Kaufmann, RN as Oncology Nurse Navigator Megan Germany, RN as Oncology Nurse Navigator OTHER MD:   Megan Khan: 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  She has completed adjuvant chemotherapy and is now receiving adjuvant Trastuzumab every 3 weeks.    She completes radiation therapy in the next couple of days.  She has no skin issues today.    She underwent an echocardiogram on 12/02/2019 that showed an EF of 60-65%.  She met with Megan Khan virtually and she was cleared to continue the therapy.     REVIEW OF SYSTEMS: Megan Khan  HISTORY OF CURRENT ILLNESS: From the original intake note:  Megan Khan had routine screening mammography on 02/24/2019 showing a possible abnormality in the right breast. She underwent unilateral right diagnostic mammography with tomography and right breast ultrasonography at [facility] on 03/04/2019 showing: Breast Density Category B. Additional imaging of the right breast was performed. There is a persistent asymmetry and distortion in the middle third of the upper-outer quadrant of the breast. There are no malignant type microcalcifications. On physical exam, I do not palpate a  mass in the upper-outer quadrant of the right breast. Targeted ultrasound is performed, showing an irregular hypoechoic mass in the right breast 11 o'clock 7 cm from the nipple measuring 0.7 x 0.7 x 1.0 cm. Sonographic evaluation of the right axilla does not show any enlarged adenopathy.  Accordingly on 03/11/2019 she proceeded to biopsy of the right breast area in question. The pathology from this procedure showed ([id number]): invasive ductal carcinoma, grade II, 11 o'clock 7 cm from the nipple. Prognostic indicators significant for: estrogen receptor, 0% negative and progesterone receptor, 0% negative. Proliferation marker Ki67 at 10%. HER2 positive (3+) by immunohistochemistry.  The patient's subsequent history is as detailed below.    PAST MEDICAL HISTORY: Past Medical History:  Diagnosis Date  . Anemia    during pregnancy  . Cancer [facility]) 2020   breast cancer on right  . Diabetes mellitus    Type 2  . Family history of breast cancer   . Family history of throat cancer   . Glaucoma   . Glaucoma   . Hx of colonic polyps   . Hyperlipidemia   . Hypertension   . Legally blind    completely blind in left eye, low vision in right    PAST SURGICAL HISTORY: Past Surgical History:  Procedure Laterality Date  . BREAST LUMPECTOMY WITH RADIOACTIVE SEED AND SENTINEL LYMPH NODE BIOPSY Right 05/29/2019   Procedure: RIGHT BREAST LUMPECTOMY WITH RADIOACTIVE SEED AND RIGHT DEEP AXILLARY SENTINEL LYMPH NODE BIOPSY INJECT BLUE DYE;  Surgeon: Fanny Skates, MD;  Location: [facility];  Service: General;  Laterality: Right;  . COLONOSCOPY  01/22/2012   Procedure: COLONOSCOPY;  Surgeon: Megan Horner, MD;  Location: [facility];  Service: Endoscopy;  Laterality: N/A;  . COLONOSCOPY    . COLONOSCOPY    . PORTACATH PLACEMENT N/A 05/29/2019   Procedure: INSERTION PORT-A-CATH WITH ULTRASOUND;  Surgeon: Fanny Skates, MD;  Location: [facility];  Service: General;  Laterality: N/A;  . RE-EXCISION OF BREAST  LUMPECTOMY Right 06/10/2019   Procedure: RE-EXCISION OF RIGHT BREAST LUMPECTOMY MARGINS;  Surgeon: Fanny Skates, MD;  Location: [facility];  Service: General;  Laterality: Right;  . TUBAL LIGATION      FAMILY HISTORY: Family History  Problem Relation Age of Onset  . Breast cancer Sister 37       Her2 +  . Breast cancer Cousin        maternal second cousin, Bilateral mastectomy, diagnosed in her 88s   Emory's father died from natural causes at age 56. Patients' mother died from natural causes at age 45. The patient has 5 brothers and 3 sisters.  The patient's sister, Megan Khan, is my patient diagnosed with breast cancer at 53.  The cyst cancer was estrogen receptor positive, HER-2 negative.  She received no chemo.  Megan Khan's maternal 2nd cousin was diagnosed with breast cancer. Patient denies anyone in her family having ovarian, prostate, or pancreatic cancer.    GYNECOLOGIC HISTORY:  No LMP recorded. Patient is postmenopausal. Menarche: 64 years old Age at first live birth: 64 years old GX P: 3 LMP: 63 Contraceptive: no HRT: no  Hysterectomy?: no BSO?: no   SOCIAL HISTORY: (Current as of 03/30/2019) Megan Khan is disabled secondary to her glaucoma as she is legally blind.  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 She is divorced. She lives with her son, Megan Khan who is 34 and is disabled with cerebral palsy. He does attend school.  They have a new puppy named Megan Khan. Her son Megan Khan was killed (gunshot wound) age 8. Her son, Megan Khan, is 35 and works for the [employer]. Her daughter, Megan Khan, is 63 and is a Quarry manager. Megan Khan has 7 grandchildren and 2 great-grandchildren.  She attends the [place]: Not in place. She has verbally named her sister, Megan Khan, as her healthcare power of attorney.  At the 03/30/2019 visit she was given the appropriate documents to complete and notarize at her discretion   HEALTH  MAINTENANCE: Social History   Tobacco Use  . Smoking status: Former Smoker    Types: Cigarettes    Quit date: 09/23/1995    Years since quitting: 24.2  . Smokeless tobacco: Never Used  Substance Use Topics  . Alcohol use: No  . Drug use: No    Colonoscopy: June 2013/again in  PAP: Per Dr. Criss Rosales  Bone density: Never  Allergies  Allergen Reactions  . Sulfamethoxazole Rash    REACTION: Questionable allergy ?    Current Outpatient Medications  Medication Sig Dispense Refill  . amLODipine (NORVASC) 10 MG tablet TAKE 1 TABLET(10 MG) BY MOUTH DAILY (Patient taking differently: Take 10 mg by mouth daily. ) 90 tablet 3  . bimatoprost (LUMIGAN) 0.01 % SOLN Place 2 drops into the left eye at bedtime.     . blood glucose meter kit and supplies KIT Dispense based on patient and insurance preference. Use up to four times daily as directed. (FOR ICD-9 250.00, 250.01). 1 each 12  . Blood Glucose Monitoring Suppl (PRODIGY AUTOCODE BLOOD GLUCOSE) w/Device KIT 1 kit by Does not apply route in the morning, at noon, and at bedtime. Any  brand acceptable if it works for patient 1 kit 0  . dorzolamide (TRUSOPT) 2 % ophthalmic solution Place 2 drops into the left eye 3 (three) times daily.     Marland Kitchen gabapentin (NEURONTIN) 100 MG capsule TAKE 2 CAPSULES(200 MG) BY MOUTH THREE TIMES DAILY 180 capsule 0  . Glucose Blood (BLOOD GLUCOSE TEST STRIPS) STRP 1 strip by In Vitro route 3 (three) times daily. 270 strip 3  . lidocaine-prilocaine (EMLA) cream Apply to affected area once (Patient taking differently: Apply 1 application topically daily as needed. Port: Apply to affected area once) 30 g 3  . lisinopril-hydrochlorothiazide (ZESTORETIC) 20-12.5 MG tablet Take 2 tablets by mouth daily. 60 tablet 6  . lovastatin (MEVACOR) 20 MG tablet Take 1 tablet (20 mg total) by mouth at bedtime. 90 tablet 3  . metFORMIN (GLUCOPHAGE) 1000 MG tablet Take 1 tablet (1,000 mg total) by mouth 2 (two) times daily with a meal. 360  tablet 1  . metoprolol succinate (TOPROL-XL) 50 MG 24 hr tablet TAKE 1 TABLET(50 MG) BY MOUTH DAILY (Patient taking differently: Take 50 mg by mouth daily. ) 90 tablet 0  . magic mouthwash SOLN Take 5 mLs by mouth 3 (three) times daily as needed for mouth pain.     Marland Kitchen prochlorperazine (COMPAZINE) 10 MG tablet TAKE 1 TABLET(10 MG) BY MOUTH EVERY 6 HOURS AS NEEDED FOR NAUSEA OR VOMITING (Patient not taking: No sig reported) 30 tablet 1  . sodium chloride (OCEAN) 0.65 % SOLN nasal spray Place 1 spray into both nostrils as needed for congestion. (Patient not taking: Reported on 12/17/2019) 30 mL 0   No current facility-administered medications for this visit.     OBJECTIVE: Middle-aged African-American woman who appears stated age 73:   12/17/19 0855  BP: (!) 149/86  Pulse: 65  Resp: 18  Temp: 98.5 F (36.9 C)  SpO2: 100%     Body mass index is 29.54 kg/m.   Wt Readings from Last 3 Encounters:  12/17/19 158 lb 14.4 oz (72.1 kg)  11/25/19 155 lb 11.2 oz (70.6 kg)  10/17/19 158 lb (71.7 kg)  ECOG FS:1 - Symptomatic but completely ambulatory  GENERAL: Patient is a well appearing female in no acute distress HEENT:  Sclerae anicteric. Mask in place. Neck is supple.  NODES:  No cervical, supraclavicular, or axillary lymphadenopathy palpated.  BREAST EXAM:  Right breast s/p lumpectomy, erythematous secondary to radiation, no sign of local recurrence, left breast benign. LUNGS:  Clear to auscultation bilaterally.  No wheezes or rhonchi. HEART:  Regular rate and rhythm. No murmur appreciated. ABDOMEN:  Soft, nontender.  Positive, normoactive bowel sounds. No organomegaly palpated. MSK:  No focal spinal tenderness to palpation. Full range of motion bilaterally in the upper extremities. EXTREMITIES:  No peripheral edema.   SKIN:  Clear with no obvious rashes or skin changes. No nail dyscrasia. NEURO:  Nonfocal. Well oriented.  Appropriate affect.     LAB RESULTS:  CMP     Component  Value Date/Time   NA 141 11/25/2019 0839   NA 142 05/27/2018 1533   K 3.3 (L) 11/25/2019 0839   CL 106 11/25/2019 0839   CO2 25 11/25/2019 0839   GLUCOSE 136 (H) 11/25/2019 0839   BUN 12 11/25/2019 0839   BUN 19 05/27/2018 1533   CREATININE 0.77 11/25/2019 0839   CREATININE 0.79 01/30/2016 1130   CALCIUM 9.2 11/25/2019 0839   PROT 7.0 11/25/2019 0839   ALBUMIN 3.9 11/25/2019 0839   AST 10 (L) 11/25/2019  0839   ALT 21 11/25/2019 0839   ALKPHOS 75 11/25/2019 0839   BILITOT 0.3 11/25/2019 0839   GFRNONAA >60 11/25/2019 0839   GFRNONAA 82 01/30/2016 1130   GFRAA >60 11/25/2019 0839   GFRAA >89 01/30/2016 1130    No results found for: TOTALPROTELP, ALBUMINELP, A1GS, A2GS, BETS, BETA2SER, GAMS, MSPIKE, SPEI  No results found for: KPAFRELGTCHN, LAMBDASER, KAPLAMBRATIO  Lab Results  Component Value Date   WBC 3.6 (L) 12/17/2019   NEUTROABS 2.0 12/17/2019   HGB 12.7 12/17/2019   HCT 39.8 12/17/2019   MCV 85.2 12/17/2019   PLT 236 12/17/2019    No results found for: LABCA2  No components found for: [id number]  No results for input(s): INR in the last 168 hours.  No results found for: LABCA2  No results found for: ZHG992  No results found for: EQA834  No results found for: HDQ222  No results found for: CA2729  No components found for: HGQUANT  No results found for: CEA1 / No results found for: CEA1   No results found for: AFPTUMOR  No results found for: CHROMOGRNA  No results found for: PSA1  Appointment on 12/17/2019  Component Date Value Ref Range Status  . WBC Count 12/17/2019 3.6* 4.0 - 10.5 K/uL Final  . RBC 12/17/2019 4.67  3.87 - 5.11 MIL/uL Final  . Hemoglobin 12/17/2019 12.7  12.0 - 15.0 g/dL Final  . HCT 12/17/2019 39.8  36.0 - 46.0 % Final  . MCV 12/17/2019 85.2  80.0 - 100.0 fL Final  . MCH 12/17/2019 27.2  26.0 - 34.0 pg Final  . MCHC 12/17/2019 31.9  30.0 - 36.0 g/dL Final  . RDW 12/17/2019 13.8  11.5 - 15.5 % Final  . Platelet Count  12/17/2019 236  150 - 400 K/uL Final  . nRBC 12/17/2019 0.0  0.0 - 0.2 % Final  . Neutrophils Relative % 12/17/2019 54  % Final  . Neutro Abs 12/17/2019 2.0  1.7 - 7.7 K/uL Final  . Lymphocytes Relative 12/17/2019 25  % Final  . Lymphs Abs 12/17/2019 0.9  0.7 - 4.0 K/uL Final  . Monocytes Relative 12/17/2019 14  % Final  . Monocytes Absolute 12/17/2019 0.5  0.1 - 1.0 K/uL Final  . Eosinophils Relative 12/17/2019 6  % Final  . Eosinophils Absolute 12/17/2019 0.2  0.0 - 0.5 K/uL Final  . Basophils Relative 12/17/2019 1  % Final  . Basophils Absolute 12/17/2019 0.1  0.0 - 0.1 K/uL Final  . Immature Granulocytes 12/17/2019 0  % Final  . Abs Immature Granulocytes 12/17/2019 0.01  0.00 - 0.07 K/uL Final   Performed at [facility], [address] [place], [place] [address]    (this displays the last labs from the last 3 days)  No results found for: TOTALPROTELP, ALBUMINELP, A1GS, A2GS, BETS, BETA2SER, GAMS, MSPIKE, SPEI (this displays SPEP labs)  No results found for: KPAFRELGTCHN, LAMBDASER, KAPLAMBRATIO (kappa/lambda light chains)  No results found for: HGBA, HGBA2QUANT, HGBFQUANT, HGBSQUAN (Hemoglobinopathy evaluation)   No results found for: LDH  No results found for: IRON, TIBC, IRONPCTSAT (Iron and TIBC)  No results found for: FERRITIN  Urinalysis    Component Value Date/Time   COLORURINE YELLOW 10/17/2019 1112   APPEARANCEUR HAZY (A) 10/17/2019 1112   LABSPEC 1.011 10/17/2019 1112   PHURINE 7.0 10/17/2019 1112   GLUCOSEU 50 (A) 10/17/2019 1112   HGBUR NEGATIVE 10/17/2019 [place] NEGATIVE 10/17/2019 [place] 10/17/2019 1112  PROTEINUR NEGATIVE 10/17/2019 1112   NITRITE NEGATIVE 10/17/2019 1112   LEUKOCYTESUR NEGATIVE 10/17/2019 1112     STUDIES:  ECHOCARDIOGRAM COMPLETE  Result Date: 12/02/2019    ECHOCARDIOGRAM REPORT   Patient Name:   Megan Khan Date of Exam: 12/02/2019 Medical Rec #:  [id number]          Height:       61.5 in Accession #:    [id number]        Weight:       155.7 lb Date of Birth:  11/10/1955         BSA:          1.708 m Patient Age:    66 years          BP:           144/88 mmHg Patient Gender: F                 HR:           62 bpm. Exam Location:  Inpatient Procedure: 2D Echo, Cardiac Doppler, Color Doppler and Strain Analysis Indications:    Chemotherapy evaluation v87.41/v58.11  History:        Patient has prior history of Echocardiogram examinations, most                 recent 07/22/2019. Risk Factors:Diabetes, Dyslipidemia and Former                 Smoker.  Sonographer:    Clayton Lefort RDCS (AE) Referring Phys: [place]  1. Left ventricular ejection fraction, by estimation, is 60 to 65%. The left ventricle has normal function. The left ventricle has no regional wall motion abnormalities. There is mild concentric left ventricular hypertrophy. Left ventricular diastolic parameters are consistent with Grade I diastolic dysfunction (impaired relaxation). The average left ventricular global longitudinal strain is -14.3 %.  2. Right ventricular systolic function is normal. The right ventricular size is normal. There is normal pulmonary artery systolic pressure. The estimated right ventricular systolic pressure is 79.3 mmHg.  3. The mitral valve is myxomatous. Trivial mitral valve regurgitation. No evidence of mitral stenosis.  4. The aortic valve is normal in structure. Aortic valve regurgitation is not visualized. No aortic stenosis is present.  5. The inferior vena cava is normal in size with greater than 50% respiratory variability, suggesting right atrial pressure of 3 mmHg. Comparison(s): Prior images reviewed side by side. The left ventricular function (as measured by ejection fraction) is unchanged. There is a slight reduction in LV GLS (previously borderline low, now mildly reduced). FINDINGS  Left Ventricle: Left ventricular ejection fraction, by  estimation, is 60 to 65%. The left ventricle has normal function. The left ventricle has no regional wall motion abnormalities. The average left ventricular global longitudinal strain is -14.3 %. The left ventricular internal cavity size was normal in size. There is mild concentric left ventricular hypertrophy. Left ventricular diastolic parameters are consistent with Grade I diastolic dysfunction (impaired relaxation). Normal left ventricular filling pressure. Right Ventricle: The right ventricular size is normal. No increase in right ventricular wall thickness. Right ventricular systolic function is normal. There is normal pulmonary artery systolic pressure. The tricuspid regurgitant velocity is 2.36 m/s, and  with an assumed right atrial pressure of 3 mmHg, the estimated right ventricular systolic pressure is 90.3 mmHg. Left Atrium: Left atrial size was normal in size. Right Atrium: Right atrial size was normal in size. Pericardium: There is  no evidence of pericardial effusion. Mitral Valve: The mitral valve is myxomatous. There is mild thickening of the mitral valve leaflet(s). Normal mobility of the mitral valve leaflets. Trivial mitral valve regurgitation. No evidence of mitral valve stenosis. MV peak gradient, 4.7 mmHg. The  mean mitral valve gradient is 1.0 mmHg. Tricuspid Valve: The tricuspid valve is normal in structure. Tricuspid valve regurgitation is trivial. No evidence of tricuspid stenosis. Aortic Valve: The aortic valve is normal in structure. Aortic valve regurgitation is not visualized. No aortic stenosis is present. Aortic valve mean gradient measures 4.0 mmHg. Aortic valve peak gradient measures 7.5 mmHg. Aortic valve area, by VTI measures 2.00 cm. Pulmonic Valve: The pulmonic valve was normal in structure. Pulmonic valve regurgitation is not visualized. No evidence of pulmonic stenosis. Aorta: The aortic root is normal in size and structure. Venous: The inferior vena cava is normal in size  with greater than 50% respiratory variability, suggesting right atrial pressure of 3 mmHg. IAS/Shunts: No atrial level shunt detected by color flow Doppler.  LEFT VENTRICLE PLAX 2D LVIDd:         4.60 cm  Diastology LVIDs:         2.70 cm  LV e' lateral:   7.40 cm/s LV PW:         1.30 cm  LV E/e' lateral: 7.5 LV IVS:        1.20 cm  LV e' medial:    5.66 cm/s LVOT diam:     1.80 cm  LV E/e' medial:  9.8 LV SV:         58 LV SV Index:   34       2D Longitudinal Strain LVOT Area:     2.54 cm 2D Strain GLS Avg:     -14.3 %  RIGHT VENTRICLE             IVC RV Basal diam:  2.60 cm     IVC diam: 1.40 cm RV S prime:     10.10 cm/s TAPSE (M-mode): 2.4 cm LEFT ATRIUM             Index       RIGHT ATRIUM           Index LA diam:        3.20 cm 1.87 cm/m  RA Area:     12.00 cm LA Vol (A2C):   64.0 ml 37.46 ml/m RA Volume:   22.60 ml  13.23 ml/m LA Vol (A4C):   55.9 ml 32.72 ml/m LA Biplane Vol: 66.6 ml 38.98 ml/m  AORTIC VALVE AV Area (Vmax):    2.04 cm AV Area (Vmean):   2.06 cm AV Area (VTI):     2.00 cm AV Vmax:           137.00 cm/s AV Vmean:          91.200 cm/s AV VTI:            0.291 m AV Peak Grad:      7.5 mmHg AV Mean Grad:      4.0 mmHg LVOT Vmax:         110.00 cm/s LVOT Vmean:        74.000 cm/s LVOT VTI:          0.229 m LVOT/AV VTI ratio: 0.79  AORTA Khan Root diam: 3.40 cm [facility] diam:  3.60 cm MITRAL VALVE               TRICUSPID  VALVE MV Area (PHT): 2.05 cm    TR Peak grad:   22.3 mmHg MV Peak grad:  4.7 mmHg    TR Vmax:        236.00 cm/s MV Mean grad:  1.0 mmHg MV Vmax:       1.08 m/s    SHUNTS MV Vmean:      50.9 cm/s   Systemic VTI:  0.23 m MV Decel Time: 370 msec    Systemic Diam: 1.80 cm MV E velocity: 55.70 cm/s MV A velocity: 84.40 cm/s MV E/A ratio:  0.66 Mihai Croitoru MD Electronically signed by Sanda Klein MD Signature Date/Time: 12/02/2019/1:43:17 PM    Final      ELIGIBLE FOR AVAILABLE RESEARCH PROTOCOL: UPBEAT  ASSESSMENT: 64 y.o. [place], [place] woman status post right breast  upper outer quadrant biopsy 03/11/2019 for a clinical t1b N0, stage IA invasive ductal carcinoma, grade 2, estrogen and progesterone receptor negative, HER-2 amplified, with an MIB-1 of 10%.  (1) genetics testing 03/30/2019: declined by patient  (2) Right breast lumpectomy on 05/29/2019: pT1b pN0, stage IA invasive ductal carciinoma, grade 2,   (a) a total of 5 lymph nodes were negative for carcinoma  (b) superior margin positive, cleared with addition surgery on 06/10/2019   (c) surgery delayed due to abnormal EKG, patient cleared by Dr. Gwenlyn Found on 04/21/2019  (3) adjuvant chemotherapy consisting of Abraxane weekly x12 with Trastuzumab, starting 07/10/2019, completed 09/24/2019  (a) Abraxane given due to diabetes, and inability to tolerate steroid premedication required with Paclitaxel.  (4) trastuzumab to be continued to complete 1 year (through November 2021)  (a) baseline echo 04/06/2019 shows an ejection fraction greater than 65%  (b) echo on 07/22/2019 shows EF 65-70%  (c) echo 12/02/2019 shows EF of 60-65%  (5) adjuvant radiation therapy from 11/23/2019-12/18/2019   PLAN: Joreen continues on her maintenance Trastuzumab with good tolerance.  Her heart function remains normal and she continues to f/Khan with Megan Khan for close monitoring.  She will continue with her maitnenance treatment until 06/2020 and I requested scheduling of these today.  Malachy Mood and I talked about her cancer treatment plan.  She will see [place] after each echocardiogram, and I will see her on the day of her last Trastuzumab to review her survivorship care plan, as well as her mammogram.  I reviewed with her that NCCN guidelines recommended mammograms occur at least 6 months after radiation if possible, so I placed orders for end of October, beginning of November at the breast center for her to undergo her bilateral breast diagnostic mammogram.  I also reviewed with her in detail what the [facility] appointment is, and what we will  review in November, on the day of her last Trastuzumab.    I recommended healthy diet, and for her to remain active.  She will return every 3 weeks for labs and Trastuzumab.  She will see Dr. Jana Hakim again in 02/2020 after her next echocardiogram.    She was recommended to continue with the appropriate pandemic precautions. She knows to call for any questions that may arise between now and her next appointment.  We are happy to see her sooner if needed.   Total encounter time 20 minutes.Wilber Bihari, NP 12/17/19 9:14 AM Medical Oncology and Hematology [facility] [place], [place] [address] Tel. [phone]    Fax. [phone]    *Total Encounter Time as defined by the Centers for Medicare and Medicaid Services includes, in  addition to the face-to-face time of a patient visit (documented in the note above) non-face-to-face time: obtaining and reviewing outside history, ordering and reviewing medications, tests or procedures, care coordination (communications with other health care professionals or caregivers) and documentation in the medical record.

## 2019-12-17 ENCOUNTER — Inpatient Hospital Stay: Payer: Medicare Other

## 2019-12-17 ENCOUNTER — Encounter: Payer: Self-pay | Admitting: *Deleted

## 2019-12-17 ENCOUNTER — Inpatient Hospital Stay: Payer: Medicare Other | Attending: Oncology

## 2019-12-17 ENCOUNTER — Inpatient Hospital Stay (HOSPITAL_BASED_OUTPATIENT_CLINIC_OR_DEPARTMENT_OTHER): Payer: Medicare Other | Admitting: Adult Health

## 2019-12-17 ENCOUNTER — Encounter: Payer: Self-pay | Admitting: Adult Health

## 2019-12-17 ENCOUNTER — Other Ambulatory Visit: Payer: Self-pay

## 2019-12-17 ENCOUNTER — Ambulatory Visit
Admission: RE | Admit: 2019-12-17 | Discharge: 2019-12-17 | Disposition: A | Discharge status: Home / Self Care | Payer: Medicare Other | Source: Ambulatory Visit | Attending: Radiation Oncology | Admitting: Radiation Oncology

## 2019-12-17 ENCOUNTER — Other Ambulatory Visit: Payer: Self-pay | Admitting: Family Medicine

## 2019-12-17 VITALS — BP 149/86 | HR 65 | Temp 98.5°F | Resp 18 | Ht 61.5 in | Wt 158.9 lb

## 2019-12-17 DIAGNOSIS — C50411 Malignant neoplasm of upper-outer quadrant of right female breast: Secondary | ICD-10-CM | POA: Diagnosis present

## 2019-12-17 DIAGNOSIS — Z5112 Encounter for antineoplastic immunotherapy: Secondary | ICD-10-CM | POA: Insufficient documentation

## 2019-12-17 DIAGNOSIS — E119 Type 2 diabetes mellitus without complications: Secondary | ICD-10-CM

## 2019-12-17 DIAGNOSIS — Z171 Estrogen receptor negative status [ER-]: Secondary | ICD-10-CM | POA: Insufficient documentation

## 2019-12-17 DIAGNOSIS — Z95828 Presence of other vascular implants and grafts: Secondary | ICD-10-CM

## 2019-12-17 LAB — CMP (CANCER CENTER ONLY)
ALT: 20 U/L (ref 0–44)
AST: 16 U/L (ref 15–41)
Albumin: 3.8 g/dL (ref 3.5–5.0)
Alkaline Phosphatase: 69 U/L (ref 38–126)
Anion gap: 10 (ref 5–15)
BUN: 12 mg/dL (ref 8–23)
CO2: 24 mmol/L (ref 22–32)
Calcium: 9.3 mg/dL (ref 8.9–10.3)
Chloride: 106 mmol/L (ref 98–111)
Creatinine: 0.77 mg/dL (ref 0.44–1.00)
GFR, Est AFR Am: >60 mL/min (ref >60)
GFR, Estimated: >60 mL/min (ref >60)
Glucose, Bld: 153 mg/dL — ABNORMAL HIGH (ref 70–99)
Potassium: 3.6 mmol/L (ref 3.5–5.1)
Sodium: 140 mmol/L (ref 135–145)
Total Bilirubin: 0.4 mg/dL (ref 0.3–1.2)
Total Protein: 6.9 g/dL (ref 6.5–8.1)

## 2019-12-17 LAB — CBC WITH DIFFERENTIAL (CANCER CENTER ONLY)
Abs Immature Granulocytes: 0.01 10*3/uL (ref 0.00–0.07)
Basophils Absolute: 0.1 10*3/uL (ref 0.0–0.1)
Basophils Relative: 1 %
Eosinophils Absolute: 0.2 10*3/uL (ref 0.0–0.5)
Eosinophils Relative: 6 %
HCT: 39.8 % (ref 36.0–46.0)
Hemoglobin: 12.7 g/dL (ref 12.0–15.0)
Immature Granulocytes: 0 %
Lymphocytes Relative: 25 %
Lymphs Abs: 0.9 10*3/uL (ref 0.7–4.0)
MCH: 27.2 pg (ref 26.0–34.0)
MCHC: 31.9 g/dL (ref 30.0–36.0)
MCV: 85.2 fL (ref 80.0–100.0)
Monocytes Absolute: 0.5 10*3/uL (ref 0.1–1.0)
Monocytes Relative: 14 %
Neutro Abs: 2 10*3/uL (ref 1.7–7.7)
Neutrophils Relative %: 54 %
Platelet Count: 236 10*3/uL (ref 150–400)
RBC: 4.67 MIL/uL (ref 3.87–5.11)
RDW: 13.8 % (ref 11.5–15.5)
WBC Count: 3.6 10*3/uL — ABNORMAL LOW (ref 4.0–10.5)
nRBC: 0 % (ref 0.0–0.2)

## 2019-12-17 MED ORDER — DIPHENHYDRAMINE HCL 25 MG PO CAPS
ORAL_CAPSULE | ORAL | Status: AC
  Filled 2019-12-17: qty 2

## 2019-12-17 MED ORDER — ACETAMINOPHEN 325 MG PO TABS
ORAL_TABLET | ORAL | Status: AC
  Filled 2019-12-17: qty 2

## 2019-12-17 MED ORDER — HEPARIN SOD (PORK) LOCK FLUSH 100 UNIT/ML IV SOLN
500.0000 [IU] | Freq: Once | INTRAVENOUS | Status: AC | PRN
  Administered 2019-12-17: 500 [IU]
  Filled 2019-12-17: qty 5

## 2019-12-17 MED ORDER — ACETAMINOPHEN 325 MG PO TABS
650.0000 mg | ORAL_TABLET | Freq: Once | ORAL | Status: AC
  Administered 2019-12-17: 650 mg via ORAL

## 2019-12-17 MED ORDER — SODIUM CHLORIDE 0.9 % IV SOLN
Freq: Once | INTRAVENOUS | Status: AC
  Filled 2019-12-17: qty 250

## 2019-12-17 MED ORDER — TRASTUZUMAB-DKST CHEMO 150 MG IV SOLR
450.0000 mg | Freq: Once | INTRAVENOUS | Status: AC
  Administered 2019-12-17: 450 mg via INTRAVENOUS
  Filled 2019-12-17: qty 21.43

## 2019-12-17 MED ORDER — DIPHENHYDRAMINE HCL 25 MG PO CAPS
50.0000 mg | ORAL_CAPSULE | Freq: Once | ORAL | Status: AC
  Administered 2019-12-17: 50 mg via ORAL

## 2019-12-17 MED ORDER — SODIUM CHLORIDE 0.9% FLUSH
10.0000 mL | INTRAVENOUS | Status: DC | PRN
  Administered 2019-12-17: 10 mL
  Filled 2019-12-17: qty 10

## 2019-12-17 MED ORDER — SODIUM CHLORIDE 0.9% FLUSH
10.0000 mL | Freq: Once | INTRAVENOUS | Status: AC
  Administered 2019-12-17: 10 mL
  Filled 2019-12-17: qty 10

## 2019-12-17 NOTE — Patient Instructions (Signed)
[facility] °Discharge Instructions for Patients Receiving Chemotherapy ° °Today you received the following chemotherapy agents Trastuzumab ° °To help prevent nausea and vomiting after your treatment, we encourage you to take your nausea medication as directed. °  °If you develop nausea and vomiting that is not controlled by your nausea medication, call the clinic.  ° °BELOW ARE SYMPTOMS THAT SHOULD BE REPORTED IMMEDIATELY: °· *FEVER GREATER THAN 100.5 F °· *CHILLS WITH OR WITHOUT FEVER °· NAUSEA AND VOMITING THAT IS NOT CONTROLLED WITH YOUR NAUSEA MEDICATION °· *UNUSUAL SHORTNESS OF BREATH °· *UNUSUAL BRUISING OR BLEEDING °· TENDERNESS IN MOUTH AND THROAT WITH OR WITHOUT PRESENCE OF ULCERS °· *URINARY PROBLEMS °· *BOWEL PROBLEMS °· UNUSUAL RASH °Items with * indicate a potential emergency and should be followed up as soon as possible. ° °Feel free to call the clinic should you have any questions or concerns. The clinic phone number is [phone]. ° °Please show the CHEMO ALERT CARD at check-in to the Emergency Department and triage nurse. ° ° °

## 2019-12-18 ENCOUNTER — Encounter: Payer: Self-pay | Admitting: Radiation Oncology

## 2019-12-18 ENCOUNTER — Ambulatory Visit
Admission: RE | Admit: 2019-12-18 | Discharge: 2019-12-18 | Disposition: A | Discharge status: Home / Self Care | Payer: Medicare Other | Source: Ambulatory Visit | Attending: Radiation Oncology | Admitting: Radiation Oncology

## 2019-12-18 ENCOUNTER — Other Ambulatory Visit: Payer: Self-pay

## 2019-12-18 DIAGNOSIS — C50411 Malignant neoplasm of upper-outer quadrant of right female breast: Secondary | ICD-10-CM | POA: Diagnosis not present

## 2019-12-21 ENCOUNTER — Telehealth: Payer: Self-pay | Admitting: Oncology

## 2019-12-21 NOTE — Telephone Encounter (Signed)
Scheduled appts per 5/6 los. Pt confirmed appt date and time.

## 2020-01-04 ENCOUNTER — Telehealth: Payer: Self-pay | Admitting: Family Medicine

## 2020-01-04 MED ORDER — PRODIGY AUTOCODE BLOOD GLUCOSE W/DEVICE KIT: 1.0000 | PACK | Freq: Three times a day (TID) | 0 refills | Status: AC

## 2020-01-04 NOTE — Telephone Encounter (Signed)
Diabetic supplies sent to [facility] in other encounter.  Dr. Criss Rosales

## 2020-01-04 NOTE — Telephone Encounter (Signed)
Patient needs Diabetic supplys sent to [facility]. Number is [phone]. Fax: [phone]. Please contact patient when orders are sent she states she has been waiting over a month. [employer] provided this place and information for her to give to doctor. Thanks

## 2020-01-07 ENCOUNTER — Other Ambulatory Visit: Payer: Self-pay

## 2020-01-07 ENCOUNTER — Inpatient Hospital Stay: Payer: Medicare Other

## 2020-01-07 VITALS — BP 136/89 | HR 67 | Temp 98.1°F | Resp 16

## 2020-01-07 DIAGNOSIS — Z5112 Encounter for antineoplastic immunotherapy: Secondary | ICD-10-CM | POA: Diagnosis not present

## 2020-01-07 DIAGNOSIS — Z171 Estrogen receptor negative status [ER-]: Secondary | ICD-10-CM

## 2020-01-07 DIAGNOSIS — C50411 Malignant neoplasm of upper-outer quadrant of right female breast: Secondary | ICD-10-CM

## 2020-01-07 DIAGNOSIS — Z95828 Presence of other vascular implants and grafts: Secondary | ICD-10-CM

## 2020-01-07 LAB — CMP (CANCER CENTER ONLY)
ALT: 24 U/L (ref 0–44)
AST: 13 U/L — ABNORMAL LOW (ref 15–41)
Albumin: 3.8 g/dL (ref 3.5–5.0)
Alkaline Phosphatase: 72 U/L (ref 38–126)
Anion gap: 11 (ref 5–15)
BUN: 9 mg/dL (ref 8–23)
CO2: 28 mmol/L (ref 22–32)
Calcium: 9.2 mg/dL (ref 8.9–10.3)
Chloride: 104 mmol/L (ref 98–111)
Creatinine: 0.79 mg/dL (ref 0.44–1.00)
GFR, Est AFR Am: >60 mL/min (ref >60)
GFR, Estimated: >60 mL/min (ref >60)
Glucose, Bld: 181 mg/dL — ABNORMAL HIGH (ref 70–99)
Potassium: 3.4 mmol/L — ABNORMAL LOW (ref 3.5–5.1)
Sodium: 143 mmol/L (ref 135–145)
Total Bilirubin: 0.3 mg/dL (ref 0.3–1.2)
Total Protein: 6.9 g/dL (ref 6.5–8.1)

## 2020-01-07 LAB — CBC WITH DIFFERENTIAL (CANCER CENTER ONLY)
Abs Immature Granulocytes: 0.02 10*3/uL (ref 0.00–0.07)
Basophils Absolute: 0.1 10*3/uL (ref 0.0–0.1)
Basophils Relative: 1 %
Eosinophils Absolute: 0.2 10*3/uL (ref 0.0–0.5)
Eosinophils Relative: 4 %
HCT: 40.5 % (ref 36.0–46.0)
Hemoglobin: 13 g/dL (ref 12.0–15.0)
Immature Granulocytes: 1 %
Lymphocytes Relative: 30 %
Lymphs Abs: 1.3 10*3/uL (ref 0.7–4.0)
MCH: 26.7 pg (ref 26.0–34.0)
MCHC: 32.1 g/dL (ref 30.0–36.0)
MCV: 83.3 fL (ref 80.0–100.0)
Monocytes Absolute: 0.5 10*3/uL (ref 0.1–1.0)
Monocytes Relative: 11 %
Neutro Abs: 2.3 10*3/uL (ref 1.7–7.7)
Neutrophils Relative %: 53 %
Platelet Count: 266 10*3/uL (ref 150–400)
RBC: 4.86 MIL/uL (ref 3.87–5.11)
RDW: 14 % (ref 11.5–15.5)
WBC Count: 4.3 10*3/uL (ref 4.0–10.5)
nRBC: 0 % (ref 0.0–0.2)

## 2020-01-07 MED ORDER — DIPHENHYDRAMINE HCL 25 MG PO CAPS
50.0000 mg | ORAL_CAPSULE | Freq: Once | ORAL | Status: AC
  Administered 2020-01-07: 50 mg via ORAL

## 2020-01-07 MED ORDER — SODIUM CHLORIDE 0.9 % IV SOLN
Freq: Once | INTRAVENOUS | Status: AC
  Filled 2020-01-07: qty 250

## 2020-01-07 MED ORDER — TRASTUZUMAB-DKST CHEMO 150 MG IV SOLR
450.0000 mg | Freq: Once | INTRAVENOUS | Status: AC
  Administered 2020-01-07: 450 mg via INTRAVENOUS
  Filled 2020-01-07: qty 21.43

## 2020-01-07 MED ORDER — SODIUM CHLORIDE 0.9% FLUSH
10.0000 mL | INTRAVENOUS | Status: DC | PRN
  Administered 2020-01-07: 10 mL
  Filled 2020-01-07: qty 10

## 2020-01-07 MED ORDER — SODIUM CHLORIDE 0.9% FLUSH
10.0000 mL | Freq: Once | INTRAVENOUS | Status: AC
  Administered 2020-01-07: 10 mL
  Filled 2020-01-07: qty 10

## 2020-01-07 MED ORDER — DIPHENHYDRAMINE HCL 25 MG PO CAPS
ORAL_CAPSULE | ORAL | Status: AC
  Filled 2020-01-07: qty 2

## 2020-01-07 MED ORDER — HEPARIN SOD (PORK) LOCK FLUSH 100 UNIT/ML IV SOLN
500.0000 [IU] | Freq: Once | INTRAVENOUS | Status: AC | PRN
  Administered 2020-01-07: 500 [IU]
  Filled 2020-01-07: qty 5

## 2020-01-07 MED ORDER — ACETAMINOPHEN 325 MG PO TABS
ORAL_TABLET | ORAL | Status: AC
  Filled 2020-01-07: qty 2

## 2020-01-07 MED ORDER — ACETAMINOPHEN 325 MG PO TABS
650.0000 mg | ORAL_TABLET | Freq: Once | ORAL | Status: AC
  Administered 2020-01-07: 650 mg via ORAL

## 2020-01-07 NOTE — Patient Instructions (Signed)
[facility] Discharge Instructions for Patients Receiving Chemotherapy  Today you received the following imunotherapy agents:  Trastuzumab  To help prevent nausea and vomiting after your treatment, we encourage you to take your nausea medication as needed. If you develop nausea and vomiting that is not controlled by your nausea medication, call the clinic.   BELOW ARE SYMPTOMS THAT SHOULD BE REPORTED IMMEDIATELY:  *FEVER GREATER THAN 100.5 F  *CHILLS WITH OR WITHOUT FEVER  NAUSEA AND VOMITING THAT IS NOT CONTROLLED WITH YOUR NAUSEA MEDICATION  *UNUSUAL SHORTNESS OF BREATH  *UNUSUAL BRUISING OR BLEEDING  TENDERNESS IN MOUTH AND THROAT WITH OR WITHOUT PRESENCE OF ULCERS  *URINARY PROBLEMS  *BOWEL PROBLEMS  UNUSUAL RASH Items with * indicate a potential emergency and should be followed up as soon as possible.  Feel free to call the clinic should you have any questions or concerns. The clinic phone number is [phone].  Please show the [place] at check-in to the Emergency Department and triage nurse.

## 2020-01-14 ENCOUNTER — Other Ambulatory Visit: Payer: Self-pay | Admitting: Family Medicine

## 2020-01-14 DIAGNOSIS — G8929 Other chronic pain: Secondary | ICD-10-CM

## 2020-01-27 ENCOUNTER — Encounter: Payer: Self-pay | Admitting: *Deleted

## 2020-01-28 ENCOUNTER — Other Ambulatory Visit: Payer: Medicare Other

## 2020-01-28 ENCOUNTER — Inpatient Hospital Stay: Payer: Medicare Other | Attending: Oncology

## 2020-01-28 ENCOUNTER — Other Ambulatory Visit: Payer: Self-pay

## 2020-01-28 ENCOUNTER — Inpatient Hospital Stay: Payer: Medicare Other

## 2020-01-28 ENCOUNTER — Ambulatory Visit: Payer: Medicare Other | Admitting: Adult Health

## 2020-01-28 VITALS — BP 143/90 | HR 77 | Temp 97.9°F | Resp 18 | Ht 61.5 in | Wt 158.8 lb

## 2020-01-28 DIAGNOSIS — Z5112 Encounter for antineoplastic immunotherapy: Secondary | ICD-10-CM | POA: Insufficient documentation

## 2020-01-28 DIAGNOSIS — Z171 Estrogen receptor negative status [ER-]: Secondary | ICD-10-CM

## 2020-01-28 DIAGNOSIS — C50411 Malignant neoplasm of upper-outer quadrant of right female breast: Secondary | ICD-10-CM | POA: Diagnosis present

## 2020-01-28 DIAGNOSIS — Z95828 Presence of other vascular implants and grafts: Secondary | ICD-10-CM

## 2020-01-28 LAB — CMP (CANCER CENTER ONLY)
ALT: 28 U/L (ref 0–44)
AST: 15 U/L (ref 15–41)
Albumin: 3.9 g/dL (ref 3.5–5.0)
Alkaline Phosphatase: 85 U/L (ref 38–126)
Anion gap: 11 (ref 5–15)
BUN: 12 mg/dL (ref 8–23)
CO2: 27 mmol/L (ref 22–32)
Calcium: 9.6 mg/dL (ref 8.9–10.3)
Chloride: 102 mmol/L (ref 98–111)
Creatinine: 0.89 mg/dL (ref 0.44–1.00)
GFR, Est AFR Am: >60 mL/min (ref >60)
GFR, Estimated: >60 mL/min (ref >60)
Glucose, Bld: 287 mg/dL — ABNORMAL HIGH (ref 70–99)
Potassium: 3.5 mmol/L (ref 3.5–5.1)
Sodium: 140 mmol/L (ref 135–145)
Total Bilirubin: 0.3 mg/dL (ref 0.3–1.2)
Total Protein: 6.9 g/dL (ref 6.5–8.1)

## 2020-01-28 LAB — CBC WITH DIFFERENTIAL (CANCER CENTER ONLY)
Abs Immature Granulocytes: 0.02 10*3/uL (ref 0.00–0.07)
Basophils Absolute: 0.1 10*3/uL (ref 0.0–0.1)
Basophils Relative: 1 %
Eosinophils Absolute: 0.2 10*3/uL (ref 0.0–0.5)
Eosinophils Relative: 5 %
HCT: 41.3 % (ref 36.0–46.0)
Hemoglobin: 13.2 g/dL (ref 12.0–15.0)
Immature Granulocytes: 0 %
Lymphocytes Relative: 30 %
Lymphs Abs: 1.5 10*3/uL (ref 0.7–4.0)
MCH: 26.3 pg (ref 26.0–34.0)
MCHC: 32 g/dL (ref 30.0–36.0)
MCV: 82.4 fL (ref 80.0–100.0)
Monocytes Absolute: 0.5 10*3/uL (ref 0.1–1.0)
Monocytes Relative: 10 %
Neutro Abs: 2.7 10*3/uL (ref 1.7–7.7)
Neutrophils Relative %: 54 %
Platelet Count: 243 10*3/uL (ref 150–400)
RBC: 5.01 MIL/uL (ref 3.87–5.11)
RDW: 14.2 % (ref 11.5–15.5)
WBC Count: 4.9 10*3/uL (ref 4.0–10.5)
nRBC: 0 % (ref 0.0–0.2)

## 2020-01-28 MED ORDER — SODIUM CHLORIDE 0.9% FLUSH
10.0000 mL | Freq: Once | INTRAVENOUS | Status: AC
  Administered 2020-01-28: 10 mL
  Filled 2020-01-28: qty 10

## 2020-01-28 MED ORDER — SODIUM CHLORIDE 0.9 % IV SOLN
Freq: Once | INTRAVENOUS | Status: AC
  Filled 2020-01-28: qty 250

## 2020-01-28 MED ORDER — SODIUM CHLORIDE 0.9% FLUSH
10.0000 mL | INTRAVENOUS | Status: DC | PRN
  Administered 2020-01-28: 10 mL
  Filled 2020-01-28: qty 10

## 2020-01-28 MED ORDER — ACETAMINOPHEN 325 MG PO TABS
650.0000 mg | ORAL_TABLET | Freq: Once | ORAL | Status: AC
  Administered 2020-01-28: 650 mg via ORAL

## 2020-01-28 MED ORDER — ACETAMINOPHEN 325 MG PO TABS
ORAL_TABLET | ORAL | Status: AC
  Filled 2020-01-28: qty 2

## 2020-01-28 MED ORDER — DIPHENHYDRAMINE HCL 25 MG PO CAPS
ORAL_CAPSULE | ORAL | Status: AC
  Filled 2020-01-28: qty 2

## 2020-01-28 MED ORDER — HEPARIN SOD (PORK) LOCK FLUSH 100 UNIT/ML IV SOLN
500.0000 [IU] | Freq: Once | INTRAVENOUS | Status: AC | PRN
  Administered 2020-01-28: 500 [IU]
  Filled 2020-01-28: qty 5

## 2020-01-28 MED ORDER — TRASTUZUMAB-DKST CHEMO 150 MG IV SOLR
450.0000 mg | Freq: Once | INTRAVENOUS | Status: AC
  Administered 2020-01-28: 450 mg via INTRAVENOUS
  Filled 2020-01-28: qty 21.43

## 2020-01-28 MED ORDER — DIPHENHYDRAMINE HCL 25 MG PO CAPS
50.0000 mg | ORAL_CAPSULE | Freq: Once | ORAL | Status: AC
  Administered 2020-01-28: 50 mg via ORAL

## 2020-01-28 NOTE — Patient Instructions (Signed)
[facility] Discharge Instructions for Patients Receiving Chemotherapy  Today you received the following chemotherapy agents: Trastuzumab (Ogivri)  To help prevent nausea and vomiting after your treatment, we encourage you to take your nausea medication as prescribed.   If you develop nausea and vomiting that is not controlled by your nausea medication, call the clinic.   BELOW ARE SYMPTOMS THAT SHOULD BE REPORTED IMMEDIATELY:  *FEVER GREATER THAN 100.5 F  *CHILLS WITH OR WITHOUT FEVER  NAUSEA AND VOMITING THAT IS NOT CONTROLLED WITH YOUR NAUSEA MEDICATION  *UNUSUAL SHORTNESS OF BREATH  *UNUSUAL BRUISING OR BLEEDING  TENDERNESS IN MOUTH AND THROAT WITH OR WITHOUT PRESENCE OF ULCERS  *URINARY PROBLEMS  *BOWEL PROBLEMS  UNUSUAL RASH Items with * indicate a potential emergency and should be followed up as soon as possible.  Feel free to call the clinic should you have any questions or concerns. The clinic phone number is [phone].  Please show the CHEMO ALERT CARD at check-in to the Emergency Department and triage nurse.   

## 2020-02-04 ENCOUNTER — Other Ambulatory Visit: Payer: Self-pay

## 2020-02-04 ENCOUNTER — Encounter: Payer: Self-pay | Admitting: Family Medicine

## 2020-02-04 ENCOUNTER — Ambulatory Visit (INDEPENDENT_AMBULATORY_CARE_PROVIDER_SITE_OTHER): Payer: Medicare Other | Admitting: Family Medicine

## 2020-02-04 ENCOUNTER — Telehealth: Payer: Self-pay | Admitting: Radiation Oncology

## 2020-02-04 VITALS — BP 128/72 | HR 71 | Ht 61.5 in | Wt 159.2 lb

## 2020-02-04 DIAGNOSIS — E1122 Type 2 diabetes mellitus with diabetic chronic kidney disease: Secondary | ICD-10-CM | POA: Diagnosis not present

## 2020-02-04 DIAGNOSIS — N181 Chronic kidney disease, stage 1: Secondary | ICD-10-CM | POA: Diagnosis not present

## 2020-02-04 LAB — POCT GLYCOSYLATED HEMOGLOBIN (HGB A1C): HbA1c, POC (controlled diabetic range): 9.6 % — AB (ref 0.0–7.0)

## 2020-02-04 NOTE — Patient Instructions (Addendum)
Today we talked about trying to get your diabetes under control.  We want to start a prior authorization process to try and get you a combination of Metformin and Jardiance pill.  We should hopefully get this resolved soon but as I am leaving and going out of town soon I want to make sure that you are aware that we do not want you to let this fall through the cracks.  If you do not hear from somebody about this soon please make sure that you reach back out to [place].  You have an appointment July 19 at 3:00 with Dr. Janus Molder  -Dr. Criss Rosales

## 2020-02-04 NOTE — Telephone Encounter (Signed)
  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  Narrative:  The patient was contacted today for routine follow-up. During treatment she did very well with radiotherapy and did not have significant desquamation. She reports she feels like her skin is doing much better in the past few weeks and her skin has resolved.  Impression/Plan: 1. 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 The patient has been doing well since completion of radiotherapy. We discussed that we would be happy to continue to follow her as needed, but she will also continue to follow up with Dr. Jana Hakim in medical oncology. She was counseled on skin care as well as measures to avoid sun exposure to this area.  2. Survivorship. We discussed the importance of survivorship evaluation and encouraged her to attend visits with that clinic after completing HER2 therapy.    Carola Rhine, PAC

## 2020-02-05 MED ORDER — SYNJARDY 12.5-1000 MG PO TABS: 1.0000 | ORAL_TABLET | Freq: Two times a day (BID) | ORAL | 0 refills | Status: DC

## 2020-02-05 NOTE — Assessment & Plan Note (Signed)
A1c up to 9.3 from mid sevens prior, believe that cancer treatment could possibly be contributing but patient has been restricted by vision and choice to only Metformin at this point.  We will pursue prior authorization to get Synjardy, (combination Metformin and Jardiance).  I discussed the pros and cons of this medication with the patient and she is aware.  Have also scheduled follow-up visit with her next physician after my graduation, Dr. Janus Molder and schedule a follow-up appointment with her in mid July

## 2020-02-05 NOTE — Progress Notes (Signed)
    SUBJECTIVE:   CHIEF COMPLAINT / HPI: Diabetes  Patient with increasing A1c at 9.3 from mid sevens.  Concerned there might be a impact of her cancer medication on this.  Does finally have her audible glucometer which has been an issue to get obtained due to prior authorizations but she is happy with this product now.  Still wants to avoid injectables if at all possible but would like to discuss more medication because she wants to control her diabetes while she finishes out her cancer treatments.  PERTINENT  PMH / PSH: Visual deficits, cancer treatment, diabetes  OBJECTIVE:   BP 128/72   Pulse 71   Ht 5' 1.5" (1.562 m)   Wt 159 lb 3.2 oz (72.2 kg)   SpO2 98%   BMI 29.59 kg/m   General: Alert and pleasant, not in medical distress Cardiac: Regular rate and rhythm, no murmur Pulmonary: No increased work of breathing, no cough or wheeze  ASSESSMENT/PLAN:   Diabetes mellitus, type 2 (HCC) A1c up to 9.3 from mid sevens prior, believe that cancer treatment could possibly be contributing but patient has been restricted by vision and choice to only Metformin at this point.  We will pursue prior authorization to get Synjardy, (combination Metformin and Jardiance).  I discussed the pros and cons of this medication with the patient and she is aware.  Have also scheduled follow-up visit with her next physician after my graduation, Dr. Janus Molder and schedule a follow-up appointment with her in mid July     [name] Tarnov, [place]

## 2020-02-13 ENCOUNTER — Other Ambulatory Visit: Payer: Self-pay | Admitting: Family Medicine

## 2020-02-13 DIAGNOSIS — M25511 Pain in right shoulder: Secondary | ICD-10-CM

## 2020-02-15 NOTE — Progress Notes (Signed)
  Radiation Oncology         [phone] ________________________________  Name: Megan Khan MRN: [id number]  Date: 12/18/2019  DOB: 1955-11-22  End of Treatment Note  Diagnosis:   right-sided breast cancer     Indication for treatment:  Curative       Radiation treatment dates:   11/23/19 - 12/18/19  Site/dose:   The patient initially received a dose of 42.56 Gy in 16 fractions to the breast using whole-breast tangent fields. This was delivered using a 3-D conformal technique. The patient then received a boost to the seroma. This delivered an additional 8 Gy in 48fractions using a 3 field photon technique due to the depth of the seroma. The total dose was 50.56 Gy.  Narrative: The patient tolerated radiation treatment relatively well.   The patient had some expected skin irritation as she progressed during treatment. Moist desquamation was not present at the end of treatment.  Plan: The patient has completed radiation treatment. The patient will return to radiation oncology clinic for routine followup in one month. I advised the patient to call or return sooner if they have any questions or concerns related to their recovery or treatment. ________________________________  Jodelle Gross, M.D., Ph.D.

## 2020-02-18 ENCOUNTER — Inpatient Hospital Stay: Payer: Medicare Other

## 2020-02-18 ENCOUNTER — Other Ambulatory Visit: Payer: Self-pay | Admitting: Medical

## 2020-02-18 ENCOUNTER — Other Ambulatory Visit: Payer: Self-pay

## 2020-02-18 ENCOUNTER — Inpatient Hospital Stay: Payer: Medicare Other | Attending: Oncology

## 2020-02-18 VITALS — BP 134/83 | HR 63 | Temp 98.2°F | Resp 18 | Wt 158.2 lb

## 2020-02-18 DIAGNOSIS — Z5112 Encounter for antineoplastic immunotherapy: Secondary | ICD-10-CM | POA: Insufficient documentation

## 2020-02-18 DIAGNOSIS — C50411 Malignant neoplasm of upper-outer quadrant of right female breast: Secondary | ICD-10-CM | POA: Insufficient documentation

## 2020-02-18 DIAGNOSIS — Z171 Estrogen receptor negative status [ER-]: Secondary | ICD-10-CM

## 2020-02-18 DIAGNOSIS — Z95828 Presence of other vascular implants and grafts: Secondary | ICD-10-CM

## 2020-02-18 LAB — CBC WITH DIFFERENTIAL (CANCER CENTER ONLY)
Abs Immature Granulocytes: 0.02 10*3/uL (ref 0.00–0.07)
Basophils Absolute: 0.1 10*3/uL (ref 0.0–0.1)
Basophils Relative: 2 %
Eosinophils Absolute: 0.2 10*3/uL (ref 0.0–0.5)
Eosinophils Relative: 4 %
HCT: 40.9 % (ref 36.0–46.0)
Hemoglobin: 12.9 g/dL (ref 12.0–15.0)
Immature Granulocytes: 0 %
Lymphocytes Relative: 36 %
Lymphs Abs: 1.7 10*3/uL (ref 0.7–4.0)
MCH: 25.5 pg — ABNORMAL LOW (ref 26.0–34.0)
MCHC: 31.5 g/dL (ref 30.0–36.0)
MCV: 81 fL (ref 80.0–100.0)
Monocytes Absolute: 0.6 10*3/uL (ref 0.1–1.0)
Monocytes Relative: 12 %
Neutro Abs: 2.2 10*3/uL (ref 1.7–7.7)
Neutrophils Relative %: 46 %
Platelet Count: 276 10*3/uL (ref 150–400)
RBC: 5.05 MIL/uL (ref 3.87–5.11)
RDW: 14.5 % (ref 11.5–15.5)
WBC Count: 4.8 10*3/uL (ref 4.0–10.5)
nRBC: 0 % (ref 0.0–0.2)

## 2020-02-18 LAB — CMP (CANCER CENTER ONLY)
ALT: 33 U/L (ref 0–44)
AST: 18 U/L (ref 15–41)
Albumin: 3.9 g/dL (ref 3.5–5.0)
Alkaline Phosphatase: 79 U/L (ref 38–126)
Anion gap: 13 (ref 5–15)
BUN: 15 mg/dL (ref 8–23)
CO2: 23 mmol/L (ref 22–32)
Calcium: 9.3 mg/dL (ref 8.9–10.3)
Chloride: 103 mmol/L (ref 98–111)
Creatinine: 1 mg/dL (ref 0.44–1.00)
GFR, Est AFR Am: >60 mL/min (ref >60)
GFR, Estimated: 60 mL/min — ABNORMAL LOW (ref >60)
Glucose, Bld: 221 mg/dL — ABNORMAL HIGH (ref 70–99)
Potassium: 3.3 mmol/L — ABNORMAL LOW (ref 3.5–5.1)
Sodium: 139 mmol/L (ref 135–145)
Total Bilirubin: 0.3 mg/dL (ref 0.3–1.2)
Total Protein: 7 g/dL (ref 6.5–8.1)

## 2020-02-18 MED ORDER — DIPHENHYDRAMINE HCL 25 MG PO CAPS
50.0000 mg | ORAL_CAPSULE | Freq: Once | ORAL | Status: AC
  Administered 2020-02-18: 50 mg via ORAL

## 2020-02-18 MED ORDER — SODIUM CHLORIDE 0.9% FLUSH
10.0000 mL | Freq: Once | INTRAVENOUS | Status: AC
  Administered 2020-02-18: 10 mL
  Filled 2020-02-18: qty 10

## 2020-02-18 MED ORDER — ACETAMINOPHEN 325 MG PO TABS
650.0000 mg | ORAL_TABLET | Freq: Once | ORAL | Status: AC
  Administered 2020-02-18: 650 mg via ORAL

## 2020-02-18 MED ORDER — ACETAMINOPHEN 325 MG PO TABS
ORAL_TABLET | ORAL | Status: AC
  Filled 2020-02-18: qty 2

## 2020-02-18 MED ORDER — POTASSIUM CHLORIDE CRYS ER 20 MEQ PO TBCR: 20.0000 meq | EXTENDED_RELEASE_TABLET | Freq: Every day | ORAL | 0 refills | Status: DC

## 2020-02-18 MED ORDER — SODIUM CHLORIDE 0.9 % IV SOLN
Freq: Once | INTRAVENOUS | Status: AC
  Filled 2020-02-18: qty 250

## 2020-02-18 MED ORDER — HEPARIN SOD (PORK) LOCK FLUSH 100 UNIT/ML IV SOLN
500.0000 [IU] | Freq: Once | INTRAVENOUS | Status: AC | PRN
  Administered 2020-02-18: 500 [IU]
  Filled 2020-02-18: qty 5

## 2020-02-18 MED ORDER — DIPHENHYDRAMINE HCL 25 MG PO CAPS
ORAL_CAPSULE | ORAL | Status: AC
  Filled 2020-02-18: qty 2

## 2020-02-18 MED ORDER — TRASTUZUMAB-DKST CHEMO 150 MG IV SOLR
450.0000 mg | Freq: Once | INTRAVENOUS | Status: AC
  Administered 2020-02-18: 450 mg via INTRAVENOUS
  Filled 2020-02-18: qty 21.43

## 2020-02-18 MED ORDER — SODIUM CHLORIDE 0.9% FLUSH
10.0000 mL | INTRAVENOUS | Status: DC | PRN
  Administered 2020-02-18: 10 mL
  Filled 2020-02-18: qty 10

## 2020-02-18 NOTE — Patient Instructions (Signed)
[facility] Discharge Instructions for Patients Receiving Chemotherapy  Today you received the following chemotherapy agents: Trastuzumab (Ogivri)  To help prevent nausea and vomiting after your treatment, we encourage you to take your nausea medication as prescribed.   If you develop nausea and vomiting that is not controlled by your nausea medication, call the clinic.   BELOW ARE SYMPTOMS THAT SHOULD BE REPORTED IMMEDIATELY:  *FEVER GREATER THAN 100.5 F  *CHILLS WITH OR WITHOUT FEVER  NAUSEA AND VOMITING THAT IS NOT CONTROLLED WITH YOUR NAUSEA MEDICATION  *UNUSUAL SHORTNESS OF BREATH  *UNUSUAL BRUISING OR BLEEDING  TENDERNESS IN MOUTH AND THROAT WITH OR WITHOUT PRESENCE OF ULCERS  *URINARY PROBLEMS  *BOWEL PROBLEMS  UNUSUAL RASH Items with * indicate a potential emergency and should be followed up as soon as possible.  Feel free to call the clinic should you have any questions or concerns. The clinic phone number is [phone].  Please show the CHEMO ALERT CARD at check-in to the Emergency Department and triage nurse.   

## 2020-02-19 ENCOUNTER — Telehealth: Payer: Self-pay | Admitting: *Deleted

## 2020-02-19 NOTE — Telephone Encounter (Signed)
This RN spoke with the pt's sister and caregiver - [name] per need for pt to take prescription supplement of potassium.  [name] verbalized understanding.

## 2020-02-19 NOTE — Telephone Encounter (Signed)
See prior entry.

## 2020-02-22 ENCOUNTER — Telehealth: Payer: Self-pay | Admitting: Oncology

## 2020-02-22 NOTE — Telephone Encounter (Signed)
Release: [phone] Faxed medical records to attorney general @ the request of the patient @ fax#[phone]

## 2020-02-29 ENCOUNTER — Ambulatory Visit (INDEPENDENT_AMBULATORY_CARE_PROVIDER_SITE_OTHER): Payer: Medicare Other | Admitting: Family Medicine

## 2020-02-29 ENCOUNTER — Other Ambulatory Visit: Payer: Self-pay

## 2020-02-29 ENCOUNTER — Encounter: Payer: Self-pay | Admitting: Family Medicine

## 2020-02-29 ENCOUNTER — Encounter: Payer: Self-pay | Admitting: *Deleted

## 2020-02-29 ENCOUNTER — Other Ambulatory Visit (HOSPITAL_COMMUNITY)
Admission: RE | Admit: 2020-02-29 | Discharge: 2020-02-29 | Disposition: A | Discharge status: Home / Self Care | Payer: Medicare Other | Source: Ambulatory Visit | Attending: Family Medicine | Admitting: Family Medicine

## 2020-02-29 VITALS — BP 115/70 | HR 72 | Ht 61.5 in | Wt 157.4 lb

## 2020-02-29 DIAGNOSIS — Z1151 Encounter for screening for human papillomavirus (HPV): Secondary | ICD-10-CM | POA: Insufficient documentation

## 2020-02-29 DIAGNOSIS — E1122 Type 2 diabetes mellitus with diabetic chronic kidney disease: Secondary | ICD-10-CM

## 2020-02-29 DIAGNOSIS — Z01419 Encounter for gynecological examination (general) (routine) without abnormal findings: Secondary | ICD-10-CM | POA: Diagnosis present

## 2020-02-29 DIAGNOSIS — N181 Chronic kidney disease, stage 1: Secondary | ICD-10-CM

## 2020-02-29 DIAGNOSIS — Z0000 Encounter for general adult medical examination without abnormal findings: Secondary | ICD-10-CM

## 2020-02-29 NOTE — Progress Notes (Signed)
    SUBJECTIVE:   CHIEF COMPLAINT / HPI:   Diabetes Current Regimen: Synjardy 12.5-1000mg  BID  CBGs: 120-150s  Last A1c: 9.6 on 02/04/2020  Denies polyuria, polydipsia, hypoglycemia Last Eye Exam: 09/13/2017 Statin: Lovastatin 20 mg qhs ACE/ARB: Zestoretic 40-25 mg  Pap Smear Last Pap smear 12/28/2013 with NILM.  Prior paps: NILM No LMP recorded. Patient is postmenopausal. 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 S/P lumpectomy 03/19/2019 ; Next ordered 12/17/2019 Concerns: None; gaps in getting pap due to having a female provider and lack of knowledge that any female provider could perform a pap in the practice.   PERTINENT  PMH / PSH: HTN, HLD  OBJECTIVE:   BP 115/70   Pulse 72   Ht 5' 1.5" (1.562 m)   Wt 157 lb 6.4 oz (71.4 kg)   SpO2 98%   BMI 29.26 kg/m   General: Appears well, no acute distress. Age appropriate. Cardiac: RRR, normal heart sounds, no murmurs Respiratory: CTAB, normal effort Pelvic exam: normal external genitalia, vulva, vagina, cervix, uterus and adnexa, PAP: Pap smear done today, exam chaperoned by Leonia Corona, CMA.  ASSESSMENT/PLAN:   Encounter for well woman exam with routine gynecological exam -PAP performed. Results pending. -Decline STD testing.   Diabetes mellitus, type 2 ([place]) -Continue current regimen -Follow up in 2 months for diabetic foot exam and repeat Hgb A1c -Encouraged to bring all medications to next visit.   Gerlene Fee, [place]

## 2020-02-29 NOTE — Patient Instructions (Signed)
It was very nice to meet you today. Please enjoy the rest of your week. Today you were seen for a pap I will call you with abnormal results otherwise you will be notified via mail. Follow up in 2 months for diabetes follow up or sooner if needed.   Please call the clinic at [phone] if you have any concerns. It was our pleasure to serve you.

## 2020-03-01 ENCOUNTER — Other Ambulatory Visit: Payer: Self-pay | Admitting: Family Medicine

## 2020-03-01 ENCOUNTER — Telehealth: Payer: Self-pay | Admitting: Radiation Oncology

## 2020-03-01 DIAGNOSIS — G8929 Other chronic pain: Secondary | ICD-10-CM

## 2020-03-01 DIAGNOSIS — E1122 Type 2 diabetes mellitus with diabetic chronic kidney disease: Secondary | ICD-10-CM

## 2020-03-01 NOTE — Telephone Encounter (Signed)
Spoke with patient concerning her ROI request to have her records sent to her attorney. Patient disclosed that she withdrew from her case and no longer needed the records faxed. Per patient request I will leave the copy of records at the front desk for her.

## 2020-03-02 ENCOUNTER — Other Ambulatory Visit: Payer: Self-pay | Admitting: Family Medicine

## 2020-03-02 ENCOUNTER — Ambulatory Visit (HOSPITAL_COMMUNITY)
Admission: RE | Admit: 2020-03-02 | Discharge: 2020-03-02 | Disposition: A | Discharge status: Home / Self Care | Payer: Medicare Other | Source: Ambulatory Visit | Attending: Internal Medicine | Admitting: Internal Medicine

## 2020-03-02 ENCOUNTER — Ambulatory Visit (HOSPITAL_BASED_OUTPATIENT_CLINIC_OR_DEPARTMENT_OTHER)
Admission: RE | Admit: 2020-03-02 | Discharge: 2020-03-02 | Disposition: A | Discharge status: Home / Self Care | Payer: Medicare Other | Source: Ambulatory Visit | Attending: Internal Medicine | Admitting: Internal Medicine

## 2020-03-02 ENCOUNTER — Other Ambulatory Visit: Payer: Self-pay

## 2020-03-02 ENCOUNTER — Encounter (HOSPITAL_COMMUNITY): Payer: Self-pay | Admitting: Internal Medicine

## 2020-03-02 VITALS — BP 138/86 | HR 63 | Wt 156.0 lb

## 2020-03-02 DIAGNOSIS — Z01419 Encounter for gynecological examination (general) (routine) without abnormal findings: Secondary | ICD-10-CM | POA: Insufficient documentation

## 2020-03-02 DIAGNOSIS — N181 Chronic kidney disease, stage 1: Secondary | ICD-10-CM

## 2020-03-02 DIAGNOSIS — Z7984 Long term (current) use of oral hypoglycemic drugs: Secondary | ICD-10-CM | POA: Diagnosis not present

## 2020-03-02 DIAGNOSIS — Z87891 Personal history of nicotine dependence: Secondary | ICD-10-CM | POA: Diagnosis not present

## 2020-03-02 DIAGNOSIS — E876 Hypokalemia: Secondary | ICD-10-CM | POA: Diagnosis not present

## 2020-03-02 DIAGNOSIS — C50911 Malignant neoplasm of unspecified site of right female breast: Secondary | ICD-10-CM | POA: Insufficient documentation

## 2020-03-02 DIAGNOSIS — E785 Hyperlipidemia, unspecified: Secondary | ICD-10-CM | POA: Diagnosis not present

## 2020-03-02 DIAGNOSIS — Z171 Estrogen receptor negative status [ER-]: Secondary | ICD-10-CM

## 2020-03-02 DIAGNOSIS — C50411 Malignant neoplasm of upper-outer quadrant of right female breast: Secondary | ICD-10-CM

## 2020-03-02 DIAGNOSIS — Z923 Personal history of irradiation: Secondary | ICD-10-CM | POA: Diagnosis not present

## 2020-03-02 DIAGNOSIS — H548 Legal blindness, as defined in USA: Secondary | ICD-10-CM | POA: Diagnosis not present

## 2020-03-02 DIAGNOSIS — E119 Type 2 diabetes mellitus without complications: Secondary | ICD-10-CM | POA: Diagnosis not present

## 2020-03-02 DIAGNOSIS — I10 Essential (primary) hypertension: Secondary | ICD-10-CM | POA: Diagnosis not present

## 2020-03-02 DIAGNOSIS — Z79899 Other long term (current) drug therapy: Secondary | ICD-10-CM | POA: Insufficient documentation

## 2020-03-02 LAB — ECHOCARDIOGRAM COMPLETE
Area-P 1/2: 2.48 cm2
S' Lateral: 3.1 cm

## 2020-03-02 MED ORDER — SPIRONOLACTONE 25 MG PO TABS
12.5000 mg | ORAL_TABLET | Freq: Every day | ORAL | 3 refills | Status: DC
Start: 2020-03-02 — End: 2020-12-02

## 2020-03-02 NOTE — Addendum Note (Signed)
Encounter addended by: Kerry Dory, CMA on: 03/02/2020 12:40 PM  Actions taken: Pharmacy for encounter modified, Order list changed, Diagnosis association updated

## 2020-03-02 NOTE — Progress Notes (Signed)
  Echocardiogram 2D Echocardiogram has been performed.  Megan Khan 03/02/2020, 1:04 PM

## 2020-03-02 NOTE — Progress Notes (Signed)
Cardio-Oncology  Note   Date:  03/02/2020   ID:  Megan Khan, DOB 1956/08/03, MRN [id number]  Location: Home  Provider location: [facility] Type of Visit: Established patient  PCP:  Megan Fee, DO  Cardiologist:  No primary care provider on file. Primary HF: Megan Khan  Chief Complaint: Cardio-oncology follow-up   History of Present Illness:  HPI:  [name] is a  64 y.o. female with right breast cancer referred by Dr. Jana Hakim for enrollment into the Cardio-Oncology program.  She has a h/o HTN, HL, DM2, glaucoma with legal blindness. Denied any known heart disease. Pre-chemo echo 04/06/2019  EF 65-70%.   Cancer history:   Underwent right breast upper outer quadrant biopsy 03/11/2019 for a clinical t1b N0, stage IA invasive ductal carcinoma, grade 2, estrogen and progesterone receptor negative, HER-2 amplified, with an MIB-1 of 10%.  (1) Right breast lumpectomy on 05/29/2019: pT1b pN0, stage IA invasive ductal carciinoma, grade 2,  (a) a total of 5 lymph nodes were negative for carcinoma (b) superior margin positive, cleared with addition surgery on 06/10/2019  (c) surgery delayed due to abnormal EKG, patient cleared by Dr. Gwenlyn Found on 04/21/2019  (2) adjuvant chemotherapy to consist of Abraxane weekly x12 with Trastuzumab, starting 07/10/2019 (a) Abraxane given due to diabetes, and inability to tolerate steroid premedication required with Paclitaxel.  (3) trastuzumab to be continued to complete 1 year (a) baseline echo 04/06/2019 shows an ejection fraction greater than 65%  (4) adjuvant radiation to follow.  Echo 07/22/19: EF 65-70% Mild MR. Grade 1 DD GLS -16.7%  Echo 12/02/19: EF 60-65% GLS -14.3% (underestimated) Grade 1 DD   Echo today 03/02/20: EF 60-65% Grade 1 DD Personally reviewed  She is here today with her sister. Has completed XRT. Tolerating  Herceptin well. Denies SOP, orthopnea or PND.    Past Medical History:  Diagnosis Date   Anemia    during pregnancy   Cancer ([place]) 2020   breast cancer on right   Diabetes mellitus    Type 2   Family history of breast cancer    Family history of throat cancer    Glaucoma    Glaucoma    Hx of colonic polyps    Hyperlipidemia    Hypertension    Legally blind    completely blind in left eye, low vision in right   Past Surgical History:  Procedure Laterality Date   BREAST LUMPECTOMY WITH RADIOACTIVE SEED AND SENTINEL LYMPH NODE BIOPSY Right 05/29/2019   Procedure: RIGHT BREAST LUMPECTOMY WITH RADIOACTIVE SEED AND RIGHT DEEP AXILLARY SENTINEL LYMPH NODE BIOPSY INJECT BLUE DYE;  Surgeon: Megan Skates, MD;  Location: [facility];  Service: General;  Laterality: Right;   COLONOSCOPY  01/22/2012   Procedure: COLONOSCOPY;  Surgeon: [name], MD;  Location: [facility];  Service: Endoscopy;  Laterality: N/A;   COLONOSCOPY     COLONOSCOPY     PORTACATH PLACEMENT N/A 05/29/2019   Procedure: INSERTION PORT-A-CATH WITH ULTRASOUND;  Surgeon: Megan Skates, MD;  Location: [facility];  Service: General;  Laterality: N/A;   RE-EXCISION OF BREAST LUMPECTOMY Right 06/10/2019   Procedure: RE-EXCISION OF RIGHT BREAST LUMPECTOMY MARGINS;  Surgeon: Megan Skates, MD;  Location: [facility];  Service: General;  Laterality: Right;   TUBAL LIGATION       Current Outpatient Medications  Medication Sig Dispense Refill   amLODipine (NORVASC) 10 MG tablet TAKE 1 TABLET(10 MG) BY MOUTH DAILY (Patient taking differently: Take 10  mg by mouth daily. ) 90 tablet 3   bimatoprost (LUMIGAN) 0.01 % SOLN Place 2 drops into the left eye at bedtime.      blood glucose meter kit and supplies KIT Dispense based on patient and insurance preference. Use up to four times daily as directed. (FOR ICD-9 250.00, 250.01). 1 each 12   Blood Glucose Monitoring Suppl (PRODIGY AUTOCODE BLOOD GLUCOSE) w/Device KIT 1  kit by Does not apply route in the morning, at noon, and at bedtime. Any brand acceptable if it works for patient 1 kit 0   dorzolamide (TRUSOPT) 2 % ophthalmic solution Place 2 drops into the left eye 3 (three) times daily.      Empagliflozin-metFORMIN HCl (SYNJARDY) 12.12-998 MG TABS Take 1 tablet by mouth 2 (two) times daily. 60 tablet 0   gabapentin (NEURONTIN) 100 MG capsule TAKE 2 CAPSULES(200 MG) BY MOUTH THREE TIMES DAILY 180 capsule 0   lidocaine-prilocaine (EMLA) cream Apply to affected area once (Patient taking differently: Apply 1 application topically daily as needed. Port: Apply to affected area once) 30 g 3   lisinopril-hydrochlorothiazide (ZESTORETIC) 20-12.5 MG tablet Take 2 tablets by mouth daily. 60 tablet 6   lovastatin (MEVACOR) 20 MG tablet Take 1 tablet (20 mg total) by mouth at bedtime. 90 tablet 3   metoprolol succinate (TOPROL-XL) 50 MG 24 hr tablet Take 1 tablet (50 mg total) by mouth daily. 90 tablet 1   potassium chloride SA (KLOR-CON) 20 MEQ tablet Take 1 tablet (20 mEq total) by mouth daily. 14 tablet 0   PRODIGY NO CODING BLOOD GLUC test strip TEST BLOOD SUGAR 3 TIMES A DAY 100 strip 3   No current facility-administered medications for this encounter.    Allergies:   Sulfamethoxazole   Social History:  The patient  reports that she quit smoking about 24 years ago. Her smoking use included cigarettes. She has never used smokeless tobacco. She reports that she does not drink alcohol and does not use drugs.   Family History:  The patient's family history includes Breast cancer in her cousin; Breast cancer (age of onset: 22) in her sister.   ROS:  Please see the history of present illness.   All other systems are personally reviewed and negative.   Vitals:   03/02/20 1210  BP: 138/86  Pulse: 63  SpO2: 97%    Exam:   General:  Well appearing. No resp difficulty HEENT: normal Neck: supple. no JVD. Carotids 2+ bilat; no bruits. No lymphadenopathy or  thryomegaly appreciated. Cor: PMI nondisplaced. Regular rate & rhythm. No rubs, gallops or murmurs. Lungs: clear Abdomen: soft, nontender, nondistended. No hepatosplenomegaly. No bruits or masses. Good bowel sounds. Extremities: no cyanosis, clubbing, rash, edema Neuro: alert & orientedx3, cranial nerves grossly intact. moves all 4 extremities w/o difficulty. Affect pleasant  Recent Labs: 10/18/2019: Magnesium 2.1 02/18/2020: ALT 33; BUN 15; Creatinine 1.00; Hemoglobin 12.9; Platelet Count 276; Potassium 3.3; Sodium 139  Personally reviewed   Wt Readings from Last 3 Encounters:  03/02/20 70.8 kg (156 lb)  02/29/20 71.4 kg (157 lb 6.4 oz)  02/18/20 71.8 kg (158 lb 4 oz)      ASSESSMENT AND PLAN:  1. Right breast cancer  - I reviewed echos personally. EF and Doppler parameters stable. No HF on exam. Continue Herceptin.  - Repeat echo in 3 months (Finishes Herceptin 12/21)  2. HTN  - Blood pressure/improved with recent increase lisinopril/HCZ 20/25 to 40/25.  - Potassium low recently and being supplemented. Will  add spiro 12.'5mg'$  to help spare potassium  - May have to cut back HCTZ   Signed, Glori Bickers, MD  03/02/2020 12:24 PM

## 2020-03-02 NOTE — Assessment & Plan Note (Signed)
-  Continue current regimen -Follow up in 2 months for diabetic foot exam and repeat Hgb A1c -Encouraged to bring all medications to next visit.

## 2020-03-02 NOTE — Assessment & Plan Note (Addendum)
-  PAP performed. Results pending. -Decline STD testing.

## 2020-03-02 NOTE — Patient Instructions (Signed)
START Spironolactone 12.5 mg, one half tab daily  Your physician recommends that you schedule a follow-up appointment in: 3 months with Dr Haroldine Laws and echo  Your physician has requested that you have an echocardiogram. Echocardiography is a painless test that uses sound waves to create images of your heart. It provides your doctor with information about the size and shape of your heart and how well your heart's chambers and valves are working. This procedure takes approximately one hour. There are no restrictions for this procedure.   If you have any questions or concerns before your next appointment please send [place] a message through [place] or call our office at [phone].    TO LEAVE A MESSAGE FOR THE NURSE SELECT OPTION 2, PLEASE LEAVE A MESSAGE INCLUDING: . YOUR NAME . DATE OF BIRTH . CALL BACK NUMBER . REASON FOR CALL**this is important as we prioritize the call backs  YOU WILL RECEIVE A CALL BACK THE SAME DAY AS LONG AS YOU CALL BEFORE 4:00 PM

## 2020-03-04 ENCOUNTER — Encounter: Payer: Self-pay | Admitting: Family Medicine

## 2020-03-04 LAB — CYTOLOGY - PAP
Comment: NEGATIVE
Diagnosis: NEGATIVE
High risk HPV: NEGATIVE

## 2020-03-10 ENCOUNTER — Inpatient Hospital Stay: Payer: Medicare Other

## 2020-03-10 ENCOUNTER — Other Ambulatory Visit: Payer: Self-pay

## 2020-03-10 ENCOUNTER — Inpatient Hospital Stay (HOSPITAL_BASED_OUTPATIENT_CLINIC_OR_DEPARTMENT_OTHER): Payer: Medicare Other | Admitting: Oncology

## 2020-03-10 VITALS — BP 138/86 | HR 67 | Temp 97.8°F | Resp 18 | Ht 61.5 in | Wt 157.4 lb

## 2020-03-10 DIAGNOSIS — C50411 Malignant neoplasm of upper-outer quadrant of right female breast: Secondary | ICD-10-CM | POA: Diagnosis not present

## 2020-03-10 DIAGNOSIS — Z95828 Presence of other vascular implants and grafts: Secondary | ICD-10-CM

## 2020-03-10 DIAGNOSIS — Z171 Estrogen receptor negative status [ER-]: Secondary | ICD-10-CM

## 2020-03-10 DIAGNOSIS — Z5112 Encounter for antineoplastic immunotherapy: Secondary | ICD-10-CM | POA: Diagnosis not present

## 2020-03-10 LAB — CBC WITH DIFFERENTIAL (CANCER CENTER ONLY)
Abs Immature Granulocytes: 0.02 10*3/uL (ref 0.00–0.07)
Basophils Absolute: 0.1 10*3/uL (ref 0.0–0.1)
Basophils Relative: 1 %
Eosinophils Absolute: 0.2 10*3/uL (ref 0.0–0.5)
Eosinophils Relative: 4 %
HCT: 42.5 % (ref 36.0–46.0)
Hemoglobin: 13.4 g/dL (ref 12.0–15.0)
Immature Granulocytes: 0 %
Lymphocytes Relative: 27 %
Lymphs Abs: 1.5 10*3/uL (ref 0.7–4.0)
MCH: 26.2 pg (ref 26.0–34.0)
MCHC: 31.5 g/dL (ref 30.0–36.0)
MCV: 83.2 fL (ref 80.0–100.0)
Monocytes Absolute: 0.6 10*3/uL (ref 0.1–1.0)
Monocytes Relative: 10 %
Neutro Abs: 3.2 10*3/uL (ref 1.7–7.7)
Neutrophils Relative %: 58 %
Platelet Count: 281 10*3/uL (ref 150–400)
RBC: 5.11 MIL/uL (ref 3.87–5.11)
RDW: 14.8 % (ref 11.5–15.5)
WBC Count: 5.5 10*3/uL (ref 4.0–10.5)
nRBC: 0 % (ref 0.0–0.2)

## 2020-03-10 LAB — CMP (CANCER CENTER ONLY)
ALT: 21 U/L (ref 0–44)
AST: 14 U/L — ABNORMAL LOW (ref 15–41)
Albumin: 4.2 g/dL (ref 3.5–5.0)
Alkaline Phosphatase: 77 U/L (ref 38–126)
Anion gap: 12 (ref 5–15)
BUN: 14 mg/dL (ref 8–23)
CO2: 24 mmol/L (ref 22–32)
Calcium: 10.5 mg/dL — ABNORMAL HIGH (ref 8.9–10.3)
Chloride: 104 mmol/L (ref 98–111)
Creatinine: 1.01 mg/dL — ABNORMAL HIGH (ref 0.44–1.00)
GFR, Est AFR Am: >60 mL/min (ref >60)
GFR, Estimated: 59 mL/min — ABNORMAL LOW (ref >60)
Glucose, Bld: 153 mg/dL — ABNORMAL HIGH (ref 70–99)
Potassium: 3.8 mmol/L (ref 3.5–5.1)
Sodium: 140 mmol/L (ref 135–145)
Total Bilirubin: 0.4 mg/dL (ref 0.3–1.2)
Total Protein: 7.4 g/dL (ref 6.5–8.1)

## 2020-03-10 MED ORDER — ACETAMINOPHEN 325 MG PO TABS
ORAL_TABLET | ORAL | Status: AC
  Filled 2020-03-10: qty 2

## 2020-03-10 MED ORDER — HEPARIN SOD (PORK) LOCK FLUSH 100 UNIT/ML IV SOLN
500.0000 [IU] | Freq: Once | INTRAVENOUS | Status: AC | PRN
  Administered 2020-03-10: 500 [IU]
  Filled 2020-03-10: qty 5

## 2020-03-10 MED ORDER — ACETAMINOPHEN 325 MG PO TABS
650.0000 mg | ORAL_TABLET | Freq: Once | ORAL | Status: AC
  Administered 2020-03-10: 650 mg via ORAL

## 2020-03-10 MED ORDER — SODIUM CHLORIDE 0.9% FLUSH
10.0000 mL | Freq: Once | INTRAVENOUS | Status: AC
  Administered 2020-03-10: 10 mL
  Filled 2020-03-10: qty 10

## 2020-03-10 MED ORDER — DIPHENHYDRAMINE HCL 25 MG PO CAPS
50.0000 mg | ORAL_CAPSULE | Freq: Once | ORAL | Status: AC
  Administered 2020-03-10: 50 mg via ORAL

## 2020-03-10 MED ORDER — TRASTUZUMAB-DKST CHEMO 150 MG IV SOLR
450.0000 mg | Freq: Once | INTRAVENOUS | Status: AC
  Administered 2020-03-10: 450 mg via INTRAVENOUS
  Filled 2020-03-10: qty 21.43

## 2020-03-10 MED ORDER — SODIUM CHLORIDE 0.9% FLUSH
10.0000 mL | INTRAVENOUS | Status: DC | PRN
  Administered 2020-03-10: 10 mL
  Filled 2020-03-10: qty 10

## 2020-03-10 MED ORDER — DIPHENHYDRAMINE HCL 25 MG PO CAPS
ORAL_CAPSULE | ORAL | Status: AC
  Filled 2020-03-10: qty 2

## 2020-03-10 MED ORDER — SODIUM CHLORIDE 0.9 % IV SOLN
Freq: Once | INTRAVENOUS | Status: AC
  Filled 2020-03-10: qty 250

## 2020-03-10 NOTE — Progress Notes (Signed)
[place]  [phone] [phone]    ID: ENDIYA KLAHR DOB: 12/31/1955  MR#: [id number]  YWV#:[id number]  Patient Care Team: Gerlene Fee, DO as PCP - General (Family Medicine) [name], [name], MD as PCP - Advanced Heart Failure (Cardiology) Cherryl Babin, [name], MD as Consulting Physician (Oncology) [name], MD as Consulting Physician (Radiation Oncology) [name], [name], DPM as Consulting Physician (Podiatry) [name], MD as Consulting Physician (Ophthalmology) [name], MD as Consulting Physician (Gastroenterology) [name], RN as Oncology Nurse Navigator [name], RN as Oncology Nurse Navigator OTHER MD:   CHIEF COMPLAINT: Estrogen receptor negative breast cancer  CURRENT TREATMENT: maintenance Trastuzumab   INTERVAL HISTORY: [name] returns today for follow up of her estrogen receptor negative breast cancer accompanied by her son.   She continues on adjuvant Trastuzumab every 3 weeks.  She tolerates this with no side effects that she is aware of  Since her last visit, she underwent repeat echocardiogram on 03/02/2020 showing an ejection fraction of 60-65%.  She also had her Pap smear on 02/29/2020 showing no evidence of HPV.  She is due for annual mammography this month.   REVIEW OF SYSTEMS: Megan Khan does all her housework and a little cooking.  She does not otherwise exercise.  She denies unusual headaches visual changes cough phlegm production pleurisy shortness of breath or change in bowel or bladder habits.  Detailed review of systems today was otherwise stable.   HISTORY OF CURRENT ILLNESS: From the original intake note:  Megan Khan had routine screening mammography on 02/24/2019 showing a possible abnormality in the right breast. She underwent unilateral right diagnostic mammography with tomography and right breast ultrasonography at [facility] on 03/04/2019 showing: Breast  Density Category B. Additional imaging of the right breast was performed. There is a persistent asymmetry and distortion in the middle third of the upper-outer quadrant of the breast. There are no malignant type microcalcifications. On physical exam, I do not palpate a mass in the upper-outer quadrant of the right breast. Targeted ultrasound is performed, showing an irregular hypoechoic mass in the right breast 11 o'clock 7 cm from the nipple measuring 0.7 x 0.7 x 1.0 cm. Sonographic evaluation of the right axilla does not show any enlarged adenopathy.  Accordingly on 03/11/2019 she proceeded to biopsy of the right breast area in question. The pathology from this procedure showed ([id number]): invasive ductal carcinoma, grade II, 11 o'clock 7 cm from the nipple. Prognostic indicators significant for: estrogen receptor, 0% negative and progesterone receptor, 0% negative. Proliferation marker Ki67 at 10%. HER2 positive (3+) by immunohistochemistry.  The patient's subsequent history is as detailed below.    PAST MEDICAL HISTORY: Past Medical History:  Diagnosis Date  . Anemia    during pregnancy  . Cancer [facility]) 2020   breast cancer on right  . Diabetes mellitus    Type 2  . Family history of breast cancer   . Family history of throat cancer   . Glaucoma   . Glaucoma   . Hx of colonic polyps   . Hyperlipidemia   . Hypertension   . Legally blind    completely blind in left eye, low vision in right    PAST SURGICAL HISTORY: Past Surgical History:  Procedure Laterality Date  . BREAST LUMPECTOMY WITH RADIOACTIVE SEED AND SENTINEL LYMPH NODE BIOPSY Right 05/29/2019   Procedure: RIGHT BREAST LUMPECTOMY WITH RADIOACTIVE SEED AND RIGHT DEEP AXILLARY SENTINEL LYMPH NODE BIOPSY INJECT BLUE  DYE;  Surgeon: Fanny Skates, MD;  Location: [place];  Service: General;  Laterality: Right;  . COLONOSCOPY  01/22/2012   Procedure: COLONOSCOPY;  Surgeon: [name], MD;  Location: [facility];  Service:  Endoscopy;  Laterality: N/A;  . COLONOSCOPY    . COLONOSCOPY    . PORTACATH PLACEMENT N/A 05/29/2019   Procedure: INSERTION PORT-A-CATH WITH ULTRASOUND;  Surgeon: Fanny Skates, MD;  Location: [place];  Service: General;  Laterality: N/A;  . RE-EXCISION OF BREAST LUMPECTOMY Right 06/10/2019   Procedure: RE-EXCISION OF RIGHT BREAST LUMPECTOMY MARGINS;  Surgeon: Fanny Skates, MD;  Location: [place];  Service: General;  Laterality: Right;  . TUBAL LIGATION      FAMILY HISTORY: Family History  Problem Relation Age of Onset  . Breast cancer Sister 3       Her2 +  . Breast cancer Cousin        maternal second cousin, Bilateral mastectomy, diagnosed in her 50s   Sharyl's father died from natural causes at age 31. Patients' mother died from natural causes at age 8. The patient has 5 brothers and 3 sisters.  The patient's sister, Megan Khan, is my patient diagnosed with breast cancer at 43.  The cyst cancer was estrogen receptor positive, HER-2 negative.  She received no chemo.  Cletus's maternal 2nd cousin was diagnosed with breast cancer. Patient denies anyone in her family having ovarian, prostate, or pancreatic cancer.    GYNECOLOGIC HISTORY:  No LMP recorded. Patient is postmenopausal. Menarche: 64 years old Age at first live birth: 64 years old GX P: 3 LMP: 38 Contraceptive: no HRT: no  Hysterectomy?: no BSO?: no   SOCIAL HISTORY: (Current as of 03/30/2019) Megan Khan is disabled secondary to her glaucoma as she is legally blind.  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 She is divorced. She lives with her son, Megan Khan who is 88 and is disabled with cerebral palsy. He does attend school.  They have a new puppy named Tiny. Her son Megan Khan was killed (gunshot wound) age 63. Her son, Megan Khan, is 63 and works for the [employer]. Her daughter, Megan Khan, is 28 and is a Quarry manager. Megan Khan has 7 grandchildren and 2 great-grandchildren.  She attends the [employer]: Not in place. She has verbally named her sister, Megan Khan, as her healthcare power of attorney.  At the 03/30/2019 visit she was given the appropriate documents to complete and notarize at her discretion   HEALTH MAINTENANCE: Social History   Tobacco Use  . Smoking status: Former Smoker    Types: Cigarettes    Quit date: 09/23/1995    Years since quitting: 24.4  . Smokeless tobacco: Never Used  Vaping Use  . Vaping Use: Never used  Substance Use Topics  . Alcohol use: No  . Drug use: No    Colonoscopy: June 2013/again in  PAP: Per Dr. Criss Rosales  Bone density: Never  Allergies  Allergen Reactions  . Sulfamethoxazole Rash    REACTION: Questionable allergy ?    Current Outpatient Medications  Medication Sig Dispense Refill  . amLODipine (NORVASC) 10 MG tablet TAKE 1 TABLET(10 MG) BY MOUTH DAILY (Patient taking differently: Take 10 mg by mouth daily. ) 90 tablet 3  . bimatoprost (LUMIGAN) 0.01 % SOLN Place 2 drops into the left eye at bedtime.     . blood glucose meter kit and supplies KIT Dispense based  on patient and insurance preference. Use up to four times daily as directed. (FOR ICD-9 250.00, 250.01). 1 each 12  . Blood Glucose Monitoring Suppl (PRODIGY AUTOCODE BLOOD GLUCOSE) w/Device KIT 1 kit by Does not apply route in the morning, at noon, and at bedtime. Any brand acceptable if it works for patient 1 kit 0  . dorzolamide (TRUSOPT) 2 % ophthalmic solution Place 2 drops into the left eye 3 (three) times daily.     Marland Kitchen gabapentin (NEURONTIN) 100 MG capsule TAKE 2 CAPSULES(200 MG) BY MOUTH THREE TIMES DAILY 180 capsule 0  . lidocaine-prilocaine (EMLA) cream Apply to affected area once (Patient taking differently: Apply 1 application topically daily as needed. Port: Apply to affected area once) 30 g 3  . lisinopril-hydrochlorothiazide (ZESTORETIC) 20-12.5 MG tablet Take 2 tablets by mouth daily. 60 tablet 6  . lisinopril-hydrochlorothiazide  (ZESTORETIC) 20-25 MG tablet TAKE 1 TABLET BY MOUTH DAILY 90 tablet 3  . lovastatin (MEVACOR) 20 MG tablet Take 1 tablet (20 mg total) by mouth at bedtime. 90 tablet 3  . metoprolol succinate (TOPROL-XL) 50 MG 24 hr tablet Take 1 tablet (50 mg total) by mouth daily. 90 tablet 1  . potassium chloride SA (KLOR-CON) 20 MEQ tablet Take 1 tablet (20 mEq total) by mouth daily. 14 tablet 0  . PRODIGY NO CODING BLOOD GLUC test strip TEST BLOOD SUGAR 3 TIMES A DAY 100 strip 3  . spironolactone (ALDACTONE) 25 MG tablet Take 0.5 tablets (12.5 mg total) by mouth daily. 45 tablet 3  . SYNJARDY 12.12-998 MG TABS TAKE 1 TABLET BY MOUTH TWICE DAILY 60 tablet 0   No current facility-administered medications for this visit.     OBJECTIVE: African-American woman in no acute distress Vitals:   03/10/20 1304  BP: (!) 138/86  Pulse: 67  Resp: 18  Temp: 97.8 F (36.6 C)  SpO2: 100%     Body mass index is 29.26 kg/m.   Wt Readings from Last 3 Encounters:  03/10/20 157 lb 6.4 oz (71.4 kg)  03/02/20 156 lb (70.8 kg)  02/29/20 157 lb 6.4 oz (71.4 kg)  ECOG FS:1 - Symptomatic but completely ambulatory   Sclerae unicteric, EOMs intact Wearing a mask No cervical or supraclavicular adenopathy Lungs no rales or rhonchi Heart regular rate and rhythm Abd soft, nontender, positive bowel sounds MSK no focal spinal tenderness, no upper extremity lymphedema Neuro: nonfocal, well oriented, appropriate affect Breasts: The right breast is status post lumpectomy and radiation.  There is mild hyperpigmentation.  The cosmetic result is good.  There is no evidence of local recurrence.  Left breast is benign.  Both axillae are benign.    LAB RESULTS:  CMP     Component Value Date/Time   NA 140 03/10/2020 1223   NA 142 05/27/2018 1533   K 3.8 03/10/2020 1223   CL 104 03/10/2020 1223   CO2 24 03/10/2020 1223   GLUCOSE 153 (H) 03/10/2020 1223   BUN 14 03/10/2020 1223   BUN 19 05/27/2018 1533   CREATININE 1.01  (H) 03/10/2020 1223   CREATININE 0.79 01/30/2016 1130   CALCIUM 10.5 (H) 03/10/2020 1223   PROT 7.4 03/10/2020 1223   ALBUMIN 4.2 03/10/2020 1223   AST 14 (L) 03/10/2020 1223   ALT 21 03/10/2020 1223   ALKPHOS 77 03/10/2020 1223   BILITOT 0.4 03/10/2020 1223   GFRNONAA 59 (L) 03/10/2020 1223   GFRNONAA 82 01/30/2016 1130   GFRAA >60 03/10/2020 1223   GFRAA >89 01/30/2016 1130  No results found for: TOTALPROTELP, ALBUMINELP, A1GS, A2GS, BETS, BETA2SER, GAMS, MSPIKE, SPEI  No results found for: KPAFRELGTCHN, LAMBDASER, KAPLAMBRATIO  Lab Results  Component Value Date   WBC 5.5 03/10/2020   NEUTROABS 3.2 03/10/2020   HGB 13.4 03/10/2020   HCT 42.5 03/10/2020   MCV 83.2 03/10/2020   PLT 281 03/10/2020    No results found for: LABCA2  No components found for: AYTKZS010  No results for input(s): INR in the last 168 hours.  No results found for: LABCA2  No results found for: XNA355  No results found for: DDU202  No results found for: RKY706  No results found for: CA2729  No components found for: HGQUANT  No results found for: CEA1 / No results found for: CEA1   No results found for: AFPTUMOR  No results found for: CHROMOGRNA  No results found for: PSA1  Appointment on 03/10/2020  Component Date Value Ref Range Status  . Sodium 03/10/2020 140  135 - 145 mmol/L Final  . Potassium 03/10/2020 3.8  3.5 - 5.1 mmol/L Final  . Chloride 03/10/2020 104  98 - 111 mmol/L Final  . CO2 03/10/2020 24  22 - 32 mmol/L Final  . Glucose, Bld 03/10/2020 153* 70 - 99 mg/dL Final   Glucose reference range applies only to samples taken after fasting for at least 8 hours.  . BUN 03/10/2020 14  8 - 23 mg/dL Final  . Creatinine 03/10/2020 1.01* 0.44 - 1.00 mg/dL Final  . Calcium 03/10/2020 10.5* 8.9 - 10.3 mg/dL Final  . Total Protein 03/10/2020 7.4  6.5 - 8.1 g/dL Final  . Albumin 03/10/2020 4.2  3.5 - 5.0 g/dL Final  . AST 03/10/2020 14* 15 - 41 U/L Final  . ALT 03/10/2020 21   0 - 44 U/L Final  . Alkaline Phosphatase 03/10/2020 77  38 - 126 U/L Final  . Total Bilirubin 03/10/2020 0.4  0.3 - 1.2 mg/dL Final  . GFR, Est Non Af Am 03/10/2020 59* >60 mL/min Final  . GFR, Est AFR Am 03/10/2020 >60  >60 mL/min Final  . Anion gap 03/10/2020 12  5 - 15 Final   Performed at [facility], [place] [address], [place],  [address]  . WBC Count 03/10/2020 5.5  4.0 - 10.5 K/uL Final  . RBC 03/10/2020 5.11  3.87 - 5.11 MIL/uL Final  . Hemoglobin 03/10/2020 13.4  12.0 - 15.0 g/dL Final  . HCT 03/10/2020 42.5  36 - 46 % Final  . MCV 03/10/2020 83.2  80.0 - 100.0 fL Final  . MCH 03/10/2020 26.2  26.0 - 34.0 pg Final  . MCHC 03/10/2020 31.5  30.0 - 36.0 g/dL Final  . RDW 03/10/2020 14.8  11.5 - 15.5 % Final  . Platelet Count 03/10/2020 281  150 - 400 K/uL Final  . nRBC 03/10/2020 0.0  0.0 - 0.2 % Final  . Neutrophils Relative % 03/10/2020 58  % Final  . Neutro Abs 03/10/2020 3.2  1.7 - 7.7 K/uL Final  . Lymphocytes Relative 03/10/2020 27  % Final  . Lymphs Abs 03/10/2020 1.5  0.7 - 4.0 K/uL Final  . Monocytes Relative 03/10/2020 10  % Final  . Monocytes Absolute 03/10/2020 0.6  0 - 1 K/uL Final  . Eosinophils Relative 03/10/2020 4  % Final  . Eosinophils Absolute 03/10/2020 0.2  0 - 0 K/uL Final  . Basophils Relative 03/10/2020 1  % Final  . Basophils Absolute 03/10/2020 0.1  0 - 0  K/uL Final  . Immature Granulocytes 03/10/2020 0  % Final  . Abs Immature Granulocytes 03/10/2020 0.02  0.00 - 0.07 K/uL Final   Performed at [facility], [address] [place], [place] [address]    (this displays the last labs from the last 3 days)  No results found for: TOTALPROTELP, ALBUMINELP, A1GS, A2GS, BETS, BETA2SER, GAMS, MSPIKE, SPEI (this displays SPEP labs)  No results found for: KPAFRELGTCHN, LAMBDASER, KAPLAMBRATIO (kappa/lambda light chains)  No results found for: HGBA, HGBA2QUANT, HGBFQUANT, HGBSQUAN (Hemoglobinopathy  evaluation)   No results found for: LDH  No results found for: IRON, TIBC, IRONPCTSAT (Iron and TIBC)  No results found for: FERRITIN  Urinalysis    Component Value Date/Time   COLORURINE YELLOW 10/17/2019 1112   APPEARANCEUR HAZY (A) 10/17/2019 1112   LABSPEC 1.011 10/17/2019 1112   PHURINE 7.0 10/17/2019 1112   GLUCOSEU 50 (A) 10/17/2019 1112   HGBUR NEGATIVE 10/17/2019 1112   BILIRUBINUR NEGATIVE 10/17/2019 1112   KETONESUR NEGATIVE 10/17/2019 1112   PROTEINUR NEGATIVE 10/17/2019 1112   NITRITE NEGATIVE 10/17/2019 1112   LEUKOCYTESUR NEGATIVE 10/17/2019 1112     STUDIES:  ECHOCARDIOGRAM COMPLETE  Result Date: 03/02/2020    ECHOCARDIOGRAM REPORT   Patient Name:   Megan Khan Date of Exam: 03/02/2020 Medical Rec #:  [id number]         Height:       61.5 in Accession #:    [id number]        Weight:       157.4 lb Date of Birth:  1956-04-21         BSA:          1.716 m Patient Age:    64 years          BP:           128/76 mmHg Patient Gender: F                 HR:           65 bpm. Exam Location:  Outpatient Procedure: 2D Echo and 3D Echo Indications:    chemo V67.2  History:        Patient has prior history of Echocardiogram examinations, most                 recent 12/02/2019. Risk Factors:Diabetes, Hypertension and                 Dyslipidemia.  Sonographer:    Johny Chess RDCS Referring Phys: [place]  1. Left ventricular ejection fraction, by estimation, is 60 to 65%. The left ventricle has normal function. The left ventricle has no regional wall motion abnormalities. Left ventricular diastolic parameters are consistent with Grade I diastolic dysfunction (impaired relaxation). The average left ventricular global longitudinal strain is -16.0 %. GLS likely underestimated due to poor endocardial tracking.  2. Right ventricular systolic function is normal. The right ventricular size is normal. There is normal pulmonary artery systolic pressure.  3.  Left atrial size was mildly dilated.  4. The mitral valve is normal in structure. Trivial mitral valve regurgitation.  5. The aortic valve is normal in structure. Aortic valve regurgitation is not visualized.  6. The inferior vena cava is normal in size with greater than 50% respiratory variability, suggesting right atrial pressure of 3 mmHg. FINDINGS  Left Ventricle: Left ventricular ejection fraction, by estimation, is 60 to 65%. The left ventricle has normal function.  The left ventricle has no regional wall motion abnormalities. The average left ventricular global longitudinal strain is -16.0 %. The left ventricular internal cavity size was normal in size. There is no left ventricular hypertrophy. Left ventricular diastolic parameters are consistent with Grade I diastolic dysfunction (impaired relaxation). Right Ventricle: The right ventricular size is normal. No increase in right ventricular wall thickness. Right ventricular systolic function is normal. There is normal pulmonary artery systolic pressure. The tricuspid regurgitant velocity is 2.23 m/s, and  with an assumed right atrial pressure of 3 mmHg, the estimated right ventricular systolic pressure is 91.6 mmHg. Left Atrium: Left atrial size was mildly dilated. Right Atrium: Right atrial size was normal in size. Pericardium: There is no evidence of pericardial effusion. Mitral Valve: The mitral valve is normal in structure. Trivial mitral valve regurgitation. Tricuspid Valve: The tricuspid valve is normal in structure. Tricuspid valve regurgitation is trivial. Aortic Valve: The aortic valve is normal in structure. Aortic valve regurgitation is not visualized. Pulmonic Valve: The pulmonic valve was grossly normal. Pulmonic valve regurgitation is not visualized. Aorta: The aortic root and ascending aorta are structurally normal, with no evidence of dilitation. Venous: The inferior vena cava is normal in size with greater than 50% respiratory variability,  suggesting right atrial pressure of 3 mmHg. IAS/Shunts: No atrial level shunt detected by color flow Doppler.  LEFT VENTRICLE PLAX 2D LVIDd:         4.70 cm  Diastology LVIDs:         3.10 cm  LV e' lateral:   5.66 cm/s LV PW:         1.10 cm  LV E/e' lateral: 7.7 LV IVS:        1.00 cm  LV e' medial:    4.13 cm/s LVOT diam:     1.80 cm  LV E/e' medial:  10.5 LV SV:         46 LV SV Index:   27       2D Longitudinal Strain LVOT Area:     2.54 cm 2D Strain GLS Avg:     -16.0 %                          3D Volume EF:                         3D EF:        52 %                         LV EDV:       139 ml                         LV ESV:       67 ml                         LV SV:        72 ml RIGHT VENTRICLE            IVC RV S prime:     9.03 cm/s  IVC diam: 1.30 cm TAPSE (M-mode): 1.9 cm LEFT ATRIUM             Index       RIGHT ATRIUM          Index LA diam:  3.80 cm 2.21 cm/m  RA Area:     9.03 cm LA Vol (A2C):   51.6 ml 30.06 ml/m RA Volume:   15.50 ml 9.03 ml/m LA Vol (A4C):   38.7 ml 22.55 ml/m LA Biplane Vol: 45.8 ml 26.69 ml/m  AORTIC VALVE LVOT Vmax:   73.70 cm/s LVOT Vmean:  50.300 cm/s LVOT VTI:    0.180 m  AORTA Khan Root diam: 2.80 cm [facility] diam:  3.50 cm MITRAL VALVE               TRICUSPID VALVE MV Area (PHT): 2.48 cm    TR Peak grad:   19.9 mmHg MV Decel Time: 306 msec    TR Vmax:        223.00 cm/s MV E velocity: 43.40 cm/s MV A velocity: 71.40 cm/s  SHUNTS MV E/A ratio:  0.61        Systemic VTI:  0.18 m                            Systemic Diam: 1.80 cm Glori Bickers MD Electronically signed by Glori Bickers MD Signature Date/Time: 03/02/2020/12:22:01 PM    Final      ELIGIBLE FOR AVAILABLE RESEARCH PROTOCOL: UPBEAT  ASSESSMENT: 64 y.o. [place], [place] woman status post right breast upper outer quadrant biopsy 03/11/2019 for a clinical t1b N0, stage IA invasive ductal carcinoma, grade 2, estrogen and progesterone receptor negative, HER-2 amplified, with an MIB-1 of 10%.  (1)  genetics testing 03/30/2019: declined by patient  (2) Right breast lumpectomy on 05/29/2019: pT1b pN0, stage IA invasive ductal carciinoma, grade 2,   (a) a total of 5 lymph nodes were negative for carcinoma  (b) superior margin positive, cleared with addition surgery on 06/10/2019   (c) surgery delayed due to abnormal EKG, patient cleared by Dr. Gwenlyn Found on 04/21/2019  (3) adjuvant chemotherapy consisting of Abraxane weekly x12 with Trastuzumab, starting 07/10/2019, completed 09/24/2019  (a) Abraxane given due to diabetes, and inability to tolerate steroid premedication required with Paclitaxel.  (4) trastuzumab to be continued to complete 1 year (through November 2021)  (a) baseline echo 04/06/2019 shows an ejection fraction greater than 65%  (b) echo on 07/22/2019 shows EF 65-70%  (c) echo 12/02/2019 shows EF of 60-65%  (5) adjuvant radiation therapy from 11/23/2019-12/18/2019  (a) right breast / 42.56 Gy in 16 fractions  (b) seroma boost / 8 Gy in 4 fractions   PLAN: Chariti is now close to a year out from definitive surgery for her breast cancer with no evidence of disease recurrence.  That is favorable.  She is tolerating Herceptin well and the plan is to continue that through early November.  She is scheduled for repeat echo in October or perhaps that could be moved to mid November so it would be done after the last treatment but either way is fine  I encouraged her to get either the [place] or Moderna COVID-19 vaccines next available  She is due for mammography and I have entered the appropriate order.  She knows to call for any other issue that may develop before the next visit  Total encounter time 25 minutes.Sarajane Jews C. Tkeya Stencil, MD 03/10/20 1:28 PM Medical Oncology and Hematology [facility] [place], [place] [address] Tel. [phone]    Fax. [phone]   I, Wilburn Mylar, am acting as scribe for Dr. [name]. Rokhaya Quinn.  Joylene Grapes  Natascha Edmonds  MD, have reviewed the above documentation for accuracy and completeness, and I agree with the above.    *Total Encounter Time as defined by the Centers for Medicare and Medicaid Services includes, in addition to the face-to-face time of a patient visit (documented in the note above) non-face-to-face time: obtaining and reviewing outside history, ordering and reviewing medications, tests or procedures, care coordination (communications with other health care professionals or caregivers) and documentation in the medical record.

## 2020-03-10 NOTE — Patient Instructions (Signed)
[facility] Discharge Instructions for Patients Receiving Chemotherapy  Today you received the following chemotherapy agents: Trastuzumab (Ogivri)  To help prevent nausea and vomiting after your treatment, we encourage you to take your nausea medication as prescribed.   If you develop nausea and vomiting that is not controlled by your nausea medication, call the clinic.   BELOW ARE SYMPTOMS THAT SHOULD BE REPORTED IMMEDIATELY:  *FEVER GREATER THAN 100.5 F  *CHILLS WITH OR WITHOUT FEVER  NAUSEA AND VOMITING THAT IS NOT CONTROLLED WITH YOUR NAUSEA MEDICATION  *UNUSUAL SHORTNESS OF BREATH  *UNUSUAL BRUISING OR BLEEDING  TENDERNESS IN MOUTH AND THROAT WITH OR WITHOUT PRESENCE OF ULCERS  *URINARY PROBLEMS  *BOWEL PROBLEMS  UNUSUAL RASH Items with * indicate a potential emergency and should be followed up as soon as possible.  Feel free to call the clinic should you have any questions or concerns. The clinic phone number is [phone].  Please show the CHEMO ALERT CARD at check-in to the Emergency Department and triage nurse.   

## 2020-03-12 ENCOUNTER — Ambulatory Visit: Payer: Medicare Other | Attending: Internal Medicine

## 2020-03-12 ENCOUNTER — Other Ambulatory Visit: Payer: Self-pay

## 2020-03-12 DIAGNOSIS — Z23 Encounter for immunization: Secondary | ICD-10-CM

## 2020-03-12 NOTE — Progress Notes (Signed)
   Covid-19 Vaccination Clinic  Name:  MAJESTA LEICHTER    MRN: [id number] DOB: 08-22-1955  03/12/2020  Ms. Lacek was observed post Covid-19 immunization for 15 minutes without incident. She was provided with Vaccine Information Sheet and instruction to access the V-Safe system.   Ms. Mestas was instructed to call 911 with any severe reactions post vaccine: Marland Kitchen Difficulty breathing  . Swelling of face and throat  . A fast heartbeat  . A bad rash all over body  . Dizziness and weakness   Immunizations Administered    Name Date Dose VIS Date Route   Pfizer COVID-19 Vaccine 03/12/2020 11:09 AM 0.3 mL 10/07/2018 Intramuscular   Manufacturer: [employer], [employer]   Lot: [id number]   [place]: [id number]

## 2020-03-30 ENCOUNTER — Encounter: Payer: Self-pay | Admitting: *Deleted

## 2020-03-30 ENCOUNTER — Ambulatory Visit
Admission: RE | Admit: 2020-03-30 | Discharge: 2020-03-30 | Disposition: A | Discharge status: Home / Self Care | Payer: Medicare Other | Source: Ambulatory Visit | Attending: Oncology | Admitting: Oncology

## 2020-03-30 ENCOUNTER — Other Ambulatory Visit: Payer: Self-pay

## 2020-03-30 DIAGNOSIS — Z171 Estrogen receptor negative status [ER-]: Secondary | ICD-10-CM

## 2020-03-30 DIAGNOSIS — C50411 Malignant neoplasm of upper-outer quadrant of right female breast: Secondary | ICD-10-CM

## 2020-03-30 HISTORY — DX: Malignant neoplasm of unspecified site of unspecified female breast: C50.919

## 2020-03-30 HISTORY — DX: Personal history of irradiation: Z92.3

## 2020-03-31 ENCOUNTER — Inpatient Hospital Stay: Payer: Medicare Other | Attending: Oncology

## 2020-03-31 ENCOUNTER — Inpatient Hospital Stay: Payer: Medicare Other

## 2020-03-31 ENCOUNTER — Other Ambulatory Visit: Payer: Self-pay | Admitting: Family Medicine

## 2020-03-31 ENCOUNTER — Other Ambulatory Visit: Payer: Self-pay

## 2020-03-31 ENCOUNTER — Encounter: Payer: Self-pay | Admitting: *Deleted

## 2020-03-31 VITALS — BP 114/69 | HR 63 | Temp 97.9°F | Resp 16

## 2020-03-31 DIAGNOSIS — Z171 Estrogen receptor negative status [ER-]: Secondary | ICD-10-CM

## 2020-03-31 DIAGNOSIS — Z5112 Encounter for antineoplastic immunotherapy: Secondary | ICD-10-CM | POA: Insufficient documentation

## 2020-03-31 DIAGNOSIS — C50411 Malignant neoplasm of upper-outer quadrant of right female breast: Secondary | ICD-10-CM

## 2020-03-31 DIAGNOSIS — M25511 Pain in right shoulder: Secondary | ICD-10-CM

## 2020-03-31 DIAGNOSIS — G8929 Other chronic pain: Secondary | ICD-10-CM

## 2020-03-31 DIAGNOSIS — Z95828 Presence of other vascular implants and grafts: Secondary | ICD-10-CM

## 2020-03-31 LAB — CMP (CANCER CENTER ONLY)
ALT: 18 U/L (ref 0–44)
AST: 9 U/L — ABNORMAL LOW (ref 15–41)
Albumin: 3.7 g/dL (ref 3.5–5.0)
Alkaline Phosphatase: 75 U/L (ref 38–126)
Anion gap: 11 (ref 5–15)
BUN: 18 mg/dL (ref 8–23)
CO2: 24 mmol/L (ref 22–32)
Calcium: 9.9 mg/dL (ref 8.9–10.3)
Chloride: 103 mmol/L (ref 98–111)
Creatinine: 1.06 mg/dL — ABNORMAL HIGH (ref 0.44–1.00)
GFR, Est AFR Am: >60 mL/min (ref >60)
GFR, Estimated: 56 mL/min — ABNORMAL LOW (ref >60)
Glucose, Bld: 309 mg/dL — ABNORMAL HIGH (ref 70–99)
Potassium: 3.5 mmol/L (ref 3.5–5.1)
Sodium: 138 mmol/L (ref 135–145)
Total Bilirubin: 0.4 mg/dL (ref 0.3–1.2)
Total Protein: 6.7 g/dL (ref 6.5–8.1)

## 2020-03-31 LAB — CBC WITH DIFFERENTIAL (CANCER CENTER ONLY)
Abs Immature Granulocytes: 0.02 10*3/uL (ref 0.00–0.07)
Basophils Absolute: 0.1 10*3/uL (ref 0.0–0.1)
Basophils Relative: 1 %
Eosinophils Absolute: 0.2 10*3/uL (ref 0.0–0.5)
Eosinophils Relative: 5 %
HCT: 40.1 % (ref 36.0–46.0)
Hemoglobin: 12.7 g/dL (ref 12.0–15.0)
Immature Granulocytes: 0 %
Lymphocytes Relative: 29 %
Lymphs Abs: 1.5 10*3/uL (ref 0.7–4.0)
MCH: 26.3 pg (ref 26.0–34.0)
MCHC: 31.7 g/dL (ref 30.0–36.0)
MCV: 83 fL (ref 80.0–100.0)
Monocytes Absolute: 0.6 10*3/uL (ref 0.1–1.0)
Monocytes Relative: 12 %
Neutro Abs: 2.7 10*3/uL (ref 1.7–7.7)
Neutrophils Relative %: 53 %
Platelet Count: 259 10*3/uL (ref 150–400)
RBC: 4.83 MIL/uL (ref 3.87–5.11)
RDW: 14.8 % (ref 11.5–15.5)
WBC Count: 5.1 10*3/uL (ref 4.0–10.5)
nRBC: 0 % (ref 0.0–0.2)

## 2020-03-31 MED ORDER — ACETAMINOPHEN 325 MG PO TABS
ORAL_TABLET | ORAL | Status: AC
  Filled 2020-03-31: qty 2

## 2020-03-31 MED ORDER — SODIUM CHLORIDE 0.9% FLUSH
10.0000 mL | Freq: Once | INTRAVENOUS | Status: AC
  Administered 2020-03-31: 10 mL
  Filled 2020-03-31: qty 10

## 2020-03-31 MED ORDER — TRASTUZUMAB-DKST CHEMO 150 MG IV SOLR
450.0000 mg | Freq: Once | INTRAVENOUS | Status: AC
  Administered 2020-03-31: 450 mg via INTRAVENOUS
  Filled 2020-03-31: qty 21.43

## 2020-03-31 MED ORDER — SODIUM CHLORIDE 0.9 % IV SOLN
Freq: Once | INTRAVENOUS | Status: AC
  Filled 2020-03-31: qty 250

## 2020-03-31 MED ORDER — DIPHENHYDRAMINE HCL 25 MG PO CAPS
50.0000 mg | ORAL_CAPSULE | Freq: Once | ORAL | Status: AC
  Administered 2020-03-31: 50 mg via ORAL

## 2020-03-31 MED ORDER — DIPHENHYDRAMINE HCL 25 MG PO CAPS
ORAL_CAPSULE | ORAL | Status: AC
  Filled 2020-03-31: qty 2

## 2020-03-31 MED ORDER — HEPARIN SOD (PORK) LOCK FLUSH 100 UNIT/ML IV SOLN
500.0000 [IU] | Freq: Once | INTRAVENOUS | Status: AC | PRN
  Administered 2020-03-31: 500 [IU]
  Filled 2020-03-31: qty 5

## 2020-03-31 MED ORDER — SODIUM CHLORIDE 0.9% FLUSH
10.0000 mL | INTRAVENOUS | Status: DC | PRN
  Administered 2020-03-31: 10 mL
  Filled 2020-03-31: qty 10

## 2020-03-31 MED ORDER — ACETAMINOPHEN 325 MG PO TABS
650.0000 mg | ORAL_TABLET | Freq: Once | ORAL | Status: AC
  Administered 2020-03-31: 650 mg via ORAL

## 2020-03-31 NOTE — Patient Instructions (Signed)
[facility] Discharge Instructions for Patients Receiving Chemotherapy  Today you received the following chemotherapy agents: Trastuzumab (Ogivri)  To help prevent nausea and vomiting after your treatment, we encourage you to take your nausea medication as prescribed.   If you develop nausea and vomiting that is not controlled by your nausea medication, call the clinic.   BELOW ARE SYMPTOMS THAT SHOULD BE REPORTED IMMEDIATELY:  *FEVER GREATER THAN 100.5 F  *CHILLS WITH OR WITHOUT FEVER  NAUSEA AND VOMITING THAT IS NOT CONTROLLED WITH YOUR NAUSEA MEDICATION  *UNUSUAL SHORTNESS OF BREATH  *UNUSUAL BRUISING OR BLEEDING  TENDERNESS IN MOUTH AND THROAT WITH OR WITHOUT PRESENCE OF ULCERS  *URINARY PROBLEMS  *BOWEL PROBLEMS  UNUSUAL RASH Items with * indicate a potential emergency and should be followed up as soon as possible.  Feel free to call the clinic should you have any questions or concerns. The clinic phone number is [phone].  Please show the CHEMO ALERT CARD at check-in to the Emergency Department and triage nurse.   

## 2020-04-02 MED ORDER — AMLODIPINE BESYLATE 10 MG PO TABS
10.0000 mg | ORAL_TABLET | Freq: Every day | ORAL | 2 refills | Status: DC
Start: 2020-04-02 — End: 2020-07-01

## 2020-04-04 ENCOUNTER — Other Ambulatory Visit: Payer: Self-pay | Admitting: *Deleted

## 2020-04-04 DIAGNOSIS — G8929 Other chronic pain: Secondary | ICD-10-CM

## 2020-04-05 ENCOUNTER — Ambulatory Visit: Payer: Medicare Other | Attending: Internal Medicine

## 2020-04-05 DIAGNOSIS — Z23 Encounter for immunization: Secondary | ICD-10-CM

## 2020-04-05 NOTE — Progress Notes (Signed)
   Covid-19 Vaccination Clinic  Name:  Megan Khan    MRN: [id number] DOB: Jan 14, 1956  04/05/2020  [name] was observed post Covid-19 immunization for 15 minutes without incident. She was provided with Vaccine Information Sheet and instruction to access the V-Safe system.   [name] was instructed to call 911 with any severe reactions post vaccine: Marland Kitchen Difficulty breathing  . Swelling of face and throat  . A fast heartbeat  . A bad rash all over body  . Dizziness and weakness   Immunizations Administered    Name Date Dose VIS Date Route   Pfizer COVID-19 Vaccine 04/05/2020  1:39 PM 0.3 mL 10/07/2018 Intramuscular   Manufacturer: [place]   Lot: [id number]   [place]: [id number]

## 2020-04-12 ENCOUNTER — Other Ambulatory Visit: Payer: Self-pay | Admitting: Family Medicine

## 2020-04-12 DIAGNOSIS — N181 Chronic kidney disease, stage 1: Secondary | ICD-10-CM

## 2020-04-21 ENCOUNTER — Inpatient Hospital Stay: Payer: Medicare Other

## 2020-04-21 ENCOUNTER — Other Ambulatory Visit: Payer: Self-pay

## 2020-04-21 ENCOUNTER — Encounter: Payer: Self-pay | Admitting: *Deleted

## 2020-04-21 ENCOUNTER — Inpatient Hospital Stay: Payer: Medicare Other | Attending: Oncology

## 2020-04-21 VITALS — BP 108/71 | HR 74 | Temp 98.2°F | Resp 18

## 2020-04-21 DIAGNOSIS — C50411 Malignant neoplasm of upper-outer quadrant of right female breast: Secondary | ICD-10-CM

## 2020-04-21 DIAGNOSIS — Z171 Estrogen receptor negative status [ER-]: Secondary | ICD-10-CM

## 2020-04-21 DIAGNOSIS — Z5112 Encounter for antineoplastic immunotherapy: Secondary | ICD-10-CM | POA: Insufficient documentation

## 2020-04-21 DIAGNOSIS — Z95828 Presence of other vascular implants and grafts: Secondary | ICD-10-CM

## 2020-04-21 LAB — CBC WITH DIFFERENTIAL (CANCER CENTER ONLY)
Abs Immature Granulocytes: 0.01 10*3/uL (ref 0.00–0.07)
Basophils Absolute: 0.1 10*3/uL (ref 0.0–0.1)
Basophils Relative: 1 %
Eosinophils Absolute: 0.2 10*3/uL (ref 0.0–0.5)
Eosinophils Relative: 4 %
HCT: 41.6 % (ref 36.0–46.0)
Hemoglobin: 13.3 g/dL (ref 12.0–15.0)
Immature Granulocytes: 0 %
Lymphocytes Relative: 33 %
Lymphs Abs: 1.8 10*3/uL (ref 0.7–4.0)
MCH: 26.8 pg (ref 26.0–34.0)
MCHC: 32 g/dL (ref 30.0–36.0)
MCV: 83.7 fL (ref 80.0–100.0)
Monocytes Absolute: 0.6 10*3/uL (ref 0.1–1.0)
Monocytes Relative: 11 %
Neutro Abs: 2.9 10*3/uL (ref 1.7–7.7)
Neutrophils Relative %: 51 %
Platelet Count: 275 10*3/uL (ref 150–400)
RBC: 4.97 MIL/uL (ref 3.87–5.11)
RDW: 14.4 % (ref 11.5–15.5)
WBC Count: 5.6 10*3/uL (ref 4.0–10.5)
nRBC: 0 % (ref 0.0–0.2)

## 2020-04-21 LAB — CMP (CANCER CENTER ONLY)
ALT: 26 U/L (ref 0–44)
AST: 15 U/L (ref 15–41)
Albumin: 3.9 g/dL (ref 3.5–5.0)
Alkaline Phosphatase: 75 U/L (ref 38–126)
Anion gap: 12 (ref 5–15)
BUN: 15 mg/dL (ref 8–23)
CO2: 25 mmol/L (ref 22–32)
Calcium: 9.3 mg/dL (ref 8.9–10.3)
Chloride: 103 mmol/L (ref 98–111)
Creatinine: 1.09 mg/dL — ABNORMAL HIGH (ref 0.44–1.00)
GFR, Est AFR Am: >60 mL/min (ref >60)
GFR, Estimated: 54 mL/min — ABNORMAL LOW (ref >60)
Glucose, Bld: 293 mg/dL — ABNORMAL HIGH (ref 70–99)
Potassium: 3.7 mmol/L (ref 3.5–5.1)
Sodium: 140 mmol/L (ref 135–145)
Total Bilirubin: 0.3 mg/dL (ref 0.3–1.2)
Total Protein: 7.2 g/dL (ref 6.5–8.1)

## 2020-04-21 MED ORDER — SODIUM CHLORIDE 0.9 % IV SOLN
Freq: Once | INTRAVENOUS | Status: AC
  Filled 2020-04-21: qty 250

## 2020-04-21 MED ORDER — SODIUM CHLORIDE 0.9% FLUSH
10.0000 mL | Freq: Once | INTRAVENOUS | Status: AC
  Administered 2020-04-21: 10 mL
  Filled 2020-04-21: qty 10

## 2020-04-21 MED ORDER — DIPHENHYDRAMINE HCL 25 MG PO CAPS
ORAL_CAPSULE | ORAL | Status: AC
  Filled 2020-04-21: qty 2

## 2020-04-21 MED ORDER — SODIUM CHLORIDE 0.9% FLUSH
10.0000 mL | INTRAVENOUS | Status: DC | PRN
  Administered 2020-04-21: 10 mL
  Filled 2020-04-21: qty 10

## 2020-04-21 MED ORDER — DIPHENHYDRAMINE HCL 25 MG PO CAPS
50.0000 mg | ORAL_CAPSULE | Freq: Once | ORAL | Status: AC
  Administered 2020-04-21: 50 mg via ORAL

## 2020-04-21 MED ORDER — ACETAMINOPHEN 325 MG PO TABS
650.0000 mg | ORAL_TABLET | Freq: Once | ORAL | Status: AC
  Administered 2020-04-21: 650 mg via ORAL

## 2020-04-21 MED ORDER — TRASTUZUMAB-DKST CHEMO 150 MG IV SOLR
450.0000 mg | Freq: Once | INTRAVENOUS | Status: AC
  Administered 2020-04-21: 450 mg via INTRAVENOUS
  Filled 2020-04-21: qty 21.43

## 2020-04-21 MED ORDER — HEPARIN SOD (PORK) LOCK FLUSH 100 UNIT/ML IV SOLN
500.0000 [IU] | Freq: Once | INTRAVENOUS | Status: AC | PRN
  Administered 2020-04-21: 500 [IU]
  Filled 2020-04-21: qty 5

## 2020-04-21 MED ORDER — ACETAMINOPHEN 325 MG PO TABS
ORAL_TABLET | ORAL | Status: AC
  Filled 2020-04-21: qty 2

## 2020-04-21 NOTE — Patient Instructions (Signed)
[facility] Discharge Instructions for Patients Receiving Chemotherapy  Today you received the following chemotherapy agents: Trastuzumab (Ogivri)  To help prevent nausea and vomiting after your treatment, we encourage you to take your nausea medication as prescribed.   If you develop nausea and vomiting that is not controlled by your nausea medication, call the clinic.   BELOW ARE SYMPTOMS THAT SHOULD BE REPORTED IMMEDIATELY:  *FEVER GREATER THAN 100.5 F  *CHILLS WITH OR WITHOUT FEVER  NAUSEA AND VOMITING THAT IS NOT CONTROLLED WITH YOUR NAUSEA MEDICATION  *UNUSUAL SHORTNESS OF BREATH  *UNUSUAL BRUISING OR BLEEDING  TENDERNESS IN MOUTH AND THROAT WITH OR WITHOUT PRESENCE OF ULCERS  *URINARY PROBLEMS  *BOWEL PROBLEMS  UNUSUAL RASH Items with * indicate a potential emergency and should be followed up as soon as possible.  Feel free to call the clinic should you have any questions or concerns. The clinic phone number is [phone].  Please show the CHEMO ALERT CARD at check-in to the Emergency Department and triage nurse.   

## 2020-04-27 ENCOUNTER — Ambulatory Visit (INDEPENDENT_AMBULATORY_CARE_PROVIDER_SITE_OTHER): Payer: Medicare Other | Admitting: Family Medicine

## 2020-04-27 ENCOUNTER — Encounter: Payer: Self-pay | Admitting: Family Medicine

## 2020-04-27 ENCOUNTER — Other Ambulatory Visit: Payer: Self-pay

## 2020-04-27 VITALS — BP 104/72 | HR 77 | Wt 160.2 lb

## 2020-04-27 DIAGNOSIS — Z23 Encounter for immunization: Secondary | ICD-10-CM

## 2020-04-27 DIAGNOSIS — I10 Essential (primary) hypertension: Secondary | ICD-10-CM | POA: Diagnosis not present

## 2020-04-27 DIAGNOSIS — E1122 Type 2 diabetes mellitus with diabetic chronic kidney disease: Secondary | ICD-10-CM | POA: Diagnosis not present

## 2020-04-27 DIAGNOSIS — N181 Chronic kidney disease, stage 1: Secondary | ICD-10-CM | POA: Diagnosis not present

## 2020-04-27 DIAGNOSIS — R42 Dizziness and giddiness: Secondary | ICD-10-CM | POA: Diagnosis not present

## 2020-04-27 LAB — POCT GLYCOSYLATED HEMOGLOBIN (HGB A1C): HbA1c, POC (controlled diabetic range): 8.6 % — AB (ref 0.0–7.0)

## 2020-04-27 NOTE — Patient Instructions (Signed)
It was wonderful to see you today.  Please bring ALL of your medications with you to every visit.   Today we talked about:  Dizziness that start with taking synjardy. You may not be eating enough with taking this medication. Please eat at regular intervals and do not go long periods of time without eating. If this continues with this change we may need to adjust some medications.   Your A1c has improved good job. Keep taking your medications and improving your diet.  Your blood pressure was wonderful. Please continue your medications.   Please be sure to schedule 3 month follow up at the front desk before you leave today.   Please call the clinic at [phone] if your symptoms worsen or you have any concerns. It was our pleasure to serve you.  Dr. Janus Molder

## 2020-04-27 NOTE — Progress Notes (Signed)
    SUBJECTIVE:   CHIEF COMPLAINT / HPI:   Dizziness Started since taking Synjardy. Patient believes this is the issue because this is her newest medication. Dizziness are occurring 1-2 times a week.  Patient also endorsing not eating on a regular basis going long periods of time without eating.  She believes sometimes she also feels like this because her blood sugar is either too low or too high.  States that she feels low around 120s. Denies headache and vision changes  Diabetes Current Regimen: synjardy 12.12-998 mg daily CBGs: 121-554 (79 lowest); 120s feels well to the patient Last A1c: 9.6 on 02-04-2020 Denies polyuria, polydipsia, hypoglycemia Last Eye Exam: 2019 according to chart; according to patient 1 month ago with Dr. Venetia Maxon Statin: Lovastatin 20 mg daily ACE/ARB: Lisinopril-HCTZ 20-25 mg daily  Hypertension: - Medications: Amlodipine 10 mg daily, Zestoretic 20-25 mg daily metoprolol 50 mg daily spironolactone 25 mg daily - Compliance: Yes - Denies any SOB, CP, vision changes, LE edema, medication SEs, or symptoms of hypotension   PERTINENT  PMH / PSH: HTN, HLD, history of cataract, right cancer.  OBJECTIVE:   BP 104/72   Pulse 77   Wt 160 lb 3.2 oz (72.7 kg)   SpO2 97%   BMI 29.78 kg/m   General: Appears well, no acute distress. Age appropriate. Cardiac: RRR, normal heart sounds, no murmurs Respiratory: CTAB, normal effort Diabetic Foot Exam - Simple   Simple Foot Form Visual Inspection No deformities, no ulcerations, no other skin breakdown bilaterally: Yes Sensation Testing Intact to touch and monofilament testing bilaterally: Yes Pulse Check Posterior Tibialis and Dorsalis pulse intact bilaterally: Yes Comments     ASSESSMENT/PLAN:   Diabetes mellitus, type 2 (HCC) Well controlled. Need to eat at regular intervals. -Continue synjardy -Follow up in 3 months  HYPERTENSION, BENIGN SYSTEMIC BP at goal. Could possibly be too low and source of  dizziness. No medication changes as of now.  -Consider orthostatics at follow up   Dizziness Acute. Likely multifactorial with treatment for diabetes, hypertension, and cancer. Karen Kays was initiated in June/July and patient was without symptoms of dizziness. Could be related to not eating on a regular basis and going long periods without eating. Could also consider decreasing amlodipine if not improved with regular interval eating. Also consider the newest medication added per chart review was Spironolactone via Cardio-onc. -Assess at follow up and consider medication changes above.    Gerlene Fee, [place]

## 2020-05-01 ENCOUNTER — Other Ambulatory Visit: Payer: Self-pay | Admitting: Family Medicine

## 2020-05-01 DIAGNOSIS — G8929 Other chronic pain: Secondary | ICD-10-CM

## 2020-05-02 DIAGNOSIS — R42 Dizziness and giddiness: Secondary | ICD-10-CM | POA: Insufficient documentation

## 2020-05-02 NOTE — Assessment & Plan Note (Signed)
BP at goal. Could possibly be too low and source of dizziness. No medication changes as of now.  -Consider orthostatics at follow up

## 2020-05-02 NOTE — Assessment & Plan Note (Addendum)
Acute. Likely multifactorial with treatment for diabetes, hypertension, and cancer. Megan Khan was initiated in June/July and patient was without symptoms of dizziness. Could be related to not eating on a regular basis and going long periods without eating. Could also consider decreasing amlodipine if not improved with regular interval eating. Also consider the newest medication added per chart review was Spironolactone via Cardio-onc. -Assess at follow up and consider medication changes above.

## 2020-05-02 NOTE — Assessment & Plan Note (Signed)
Well controlled. Need to eat at regular intervals. -Continue synjardy -Follow up in 3 months

## 2020-05-08 ENCOUNTER — Other Ambulatory Visit: Payer: Self-pay | Admitting: Family Medicine

## 2020-05-08 DIAGNOSIS — N181 Chronic kidney disease, stage 1: Secondary | ICD-10-CM

## 2020-05-12 ENCOUNTER — Inpatient Hospital Stay: Payer: Medicare Other

## 2020-05-12 ENCOUNTER — Other Ambulatory Visit: Payer: Self-pay

## 2020-05-12 VITALS — BP 110/74 | HR 65 | Temp 98.0°F | Resp 18

## 2020-05-12 DIAGNOSIS — Z5112 Encounter for antineoplastic immunotherapy: Secondary | ICD-10-CM | POA: Diagnosis not present

## 2020-05-12 DIAGNOSIS — Z95828 Presence of other vascular implants and grafts: Secondary | ICD-10-CM

## 2020-05-12 DIAGNOSIS — Z171 Estrogen receptor negative status [ER-]: Secondary | ICD-10-CM

## 2020-05-12 LAB — CBC WITH DIFFERENTIAL (CANCER CENTER ONLY)
Abs Immature Granulocytes: 0.01 10*3/uL (ref 0.00–0.07)
Basophils Absolute: 0.1 10*3/uL (ref 0.0–0.1)
Basophils Relative: 2 %
Eosinophils Absolute: 0.2 10*3/uL (ref 0.0–0.5)
Eosinophils Relative: 4 %
HCT: 40.8 % (ref 36.0–46.0)
Hemoglobin: 12.8 g/dL (ref 12.0–15.0)
Immature Granulocytes: 0 %
Lymphocytes Relative: 31 %
Lymphs Abs: 1.6 10*3/uL (ref 0.7–4.0)
MCH: 26.3 pg (ref 26.0–34.0)
MCHC: 31.4 g/dL (ref 30.0–36.0)
MCV: 84 fL (ref 80.0–100.0)
Monocytes Absolute: 0.6 10*3/uL (ref 0.1–1.0)
Monocytes Relative: 11 %
Neutro Abs: 2.7 10*3/uL (ref 1.7–7.7)
Neutrophils Relative %: 52 %
Platelet Count: 279 10*3/uL (ref 150–400)
RBC: 4.86 MIL/uL (ref 3.87–5.11)
RDW: 14.1 % (ref 11.5–15.5)
WBC Count: 5.1 10*3/uL (ref 4.0–10.5)
nRBC: 0 % (ref 0.0–0.2)

## 2020-05-12 LAB — CMP (CANCER CENTER ONLY)
ALT: 21 U/L (ref 0–44)
AST: 17 U/L (ref 15–41)
Albumin: 3.8 g/dL (ref 3.5–5.0)
Alkaline Phosphatase: 72 U/L (ref 38–126)
Anion gap: 9 (ref 5–15)
BUN: 23 mg/dL (ref 8–23)
CO2: 25 mmol/L (ref 22–32)
Calcium: 9.4 mg/dL (ref 8.9–10.3)
Chloride: 105 mmol/L (ref 98–111)
Creatinine: 1.03 mg/dL — ABNORMAL HIGH (ref 0.44–1.00)
GFR, Est AFR Am: >60 mL/min (ref >60)
GFR, Estimated: 58 mL/min — ABNORMAL LOW (ref >60)
Glucose, Bld: 203 mg/dL — ABNORMAL HIGH (ref 70–99)
Potassium: 3.7 mmol/L (ref 3.5–5.1)
Sodium: 139 mmol/L (ref 135–145)
Total Bilirubin: 0.3 mg/dL (ref 0.3–1.2)
Total Protein: 7.2 g/dL (ref 6.5–8.1)

## 2020-05-12 MED ORDER — ACETAMINOPHEN 325 MG PO TABS
650.0000 mg | ORAL_TABLET | Freq: Once | ORAL | Status: AC
  Administered 2020-05-12: 650 mg via ORAL

## 2020-05-12 MED ORDER — SODIUM CHLORIDE 0.9% FLUSH
10.0000 mL | Freq: Once | INTRAVENOUS | Status: AC
  Administered 2020-05-12: 10 mL
  Filled 2020-05-12: qty 10

## 2020-05-12 MED ORDER — ACETAMINOPHEN 325 MG PO TABS
ORAL_TABLET | ORAL | Status: AC
  Filled 2020-05-12: qty 2

## 2020-05-12 MED ORDER — DIPHENHYDRAMINE HCL 25 MG PO CAPS
50.0000 mg | ORAL_CAPSULE | Freq: Once | ORAL | Status: AC
  Administered 2020-05-12: 50 mg via ORAL

## 2020-05-12 MED ORDER — SODIUM CHLORIDE 0.9 % IV SOLN
Freq: Once | INTRAVENOUS | Status: AC
  Filled 2020-05-12: qty 250

## 2020-05-12 MED ORDER — TRASTUZUMAB-DKST CHEMO 150 MG IV SOLR
450.0000 mg | Freq: Once | INTRAVENOUS | Status: AC
  Administered 2020-05-12: 450 mg via INTRAVENOUS
  Filled 2020-05-12: qty 21.43

## 2020-05-12 MED ORDER — DIPHENHYDRAMINE HCL 25 MG PO CAPS
ORAL_CAPSULE | ORAL | Status: AC
  Filled 2020-05-12: qty 2

## 2020-05-12 MED ORDER — HEPARIN SOD (PORK) LOCK FLUSH 100 UNIT/ML IV SOLN
500.0000 [IU] | Freq: Once | INTRAVENOUS | Status: AC | PRN
  Administered 2020-05-12: 500 [IU]
  Filled 2020-05-12: qty 5

## 2020-05-12 MED ORDER — SODIUM CHLORIDE 0.9% FLUSH
10.0000 mL | INTRAVENOUS | Status: DC | PRN
  Administered 2020-05-12: 10 mL
  Filled 2020-05-12: qty 10

## 2020-05-12 NOTE — Patient Instructions (Signed)
[facility] Discharge Instructions for Patients Receiving Chemotherapy  Today you received the following chemotherapy agents: Trastuzumab (Ogivri)  To help prevent nausea and vomiting after your treatment, we encourage you to take your nausea medication as prescribed.   If you develop nausea and vomiting that is not controlled by your nausea medication, call the clinic.   BELOW ARE SYMPTOMS THAT SHOULD BE REPORTED IMMEDIATELY:  *FEVER GREATER THAN 100.5 F  *CHILLS WITH OR WITHOUT FEVER  NAUSEA AND VOMITING THAT IS NOT CONTROLLED WITH YOUR NAUSEA MEDICATION  *UNUSUAL SHORTNESS OF BREATH  *UNUSUAL BRUISING OR BLEEDING  TENDERNESS IN MOUTH AND THROAT WITH OR WITHOUT PRESENCE OF ULCERS  *URINARY PROBLEMS  *BOWEL PROBLEMS  UNUSUAL RASH Items with * indicate a potential emergency and should be followed up as soon as possible.  Feel free to call the clinic should you have any questions or concerns. The clinic phone number is [phone].  Please show the CHEMO ALERT CARD at check-in to the Emergency Department and triage nurse.   

## 2020-05-24 ENCOUNTER — Other Ambulatory Visit: Payer: Self-pay

## 2020-05-24 ENCOUNTER — Ambulatory Visit
Admission: RE | Admit: 2020-05-24 | Discharge: 2020-05-24 | Disposition: A | Discharge status: Home / Self Care | Payer: Medicare Other | Source: Ambulatory Visit | Attending: Oncology | Admitting: Oncology

## 2020-05-31 ENCOUNTER — Other Ambulatory Visit (HOSPITAL_COMMUNITY): Payer: Self-pay | Admitting: Internal Medicine

## 2020-05-31 ENCOUNTER — Other Ambulatory Visit: Payer: Self-pay | Admitting: Family Medicine

## 2020-05-31 DIAGNOSIS — N181 Chronic kidney disease, stage 1: Secondary | ICD-10-CM

## 2020-05-31 DIAGNOSIS — E1122 Type 2 diabetes mellitus with diabetic chronic kidney disease: Secondary | ICD-10-CM

## 2020-06-02 ENCOUNTER — Encounter: Payer: Self-pay | Admitting: *Deleted

## 2020-06-02 ENCOUNTER — Inpatient Hospital Stay: Payer: Medicare Other

## 2020-06-02 ENCOUNTER — Inpatient Hospital Stay: Payer: Medicare Other | Attending: Oncology

## 2020-06-02 ENCOUNTER — Other Ambulatory Visit: Payer: Self-pay

## 2020-06-02 VITALS — BP 131/83 | HR 66 | Temp 98.1°F | Resp 18

## 2020-06-02 DIAGNOSIS — Z171 Estrogen receptor negative status [ER-]: Secondary | ICD-10-CM

## 2020-06-02 DIAGNOSIS — C50411 Malignant neoplasm of upper-outer quadrant of right female breast: Secondary | ICD-10-CM

## 2020-06-02 DIAGNOSIS — Z5112 Encounter for antineoplastic immunotherapy: Secondary | ICD-10-CM | POA: Diagnosis present

## 2020-06-02 DIAGNOSIS — Z95828 Presence of other vascular implants and grafts: Secondary | ICD-10-CM

## 2020-06-02 LAB — CMP (CANCER CENTER ONLY)
ALT: 26 U/L (ref 0–44)
AST: 14 U/L — ABNORMAL LOW (ref 15–41)
Albumin: 3.8 g/dL (ref 3.5–5.0)
Alkaline Phosphatase: 72 U/L (ref 38–126)
Anion gap: 11 (ref 5–15)
BUN: 16 mg/dL (ref 8–23)
CO2: 24 mmol/L (ref 22–32)
Calcium: 9.6 mg/dL (ref 8.9–10.3)
Chloride: 103 mmol/L (ref 98–111)
Creatinine: 1.07 mg/dL — ABNORMAL HIGH (ref 0.44–1.00)
GFR, Estimated: 58 mL/min — ABNORMAL LOW (ref >60)
Glucose, Bld: 271 mg/dL — ABNORMAL HIGH (ref 70–99)
Potassium: 3.6 mmol/L (ref 3.5–5.1)
Sodium: 138 mmol/L (ref 135–145)
Total Bilirubin: 0.3 mg/dL (ref 0.3–1.2)
Total Protein: 7 g/dL (ref 6.5–8.1)

## 2020-06-02 LAB — CBC WITH DIFFERENTIAL (CANCER CENTER ONLY)
Abs Immature Granulocytes: 0.03 10*3/uL (ref 0.00–0.07)
Basophils Absolute: 0.1 10*3/uL (ref 0.0–0.1)
Basophils Relative: 1 %
Eosinophils Absolute: 0.2 10*3/uL (ref 0.0–0.5)
Eosinophils Relative: 4 %
HCT: 40.9 % (ref 36.0–46.0)
Hemoglobin: 12.7 g/dL (ref 12.0–15.0)
Immature Granulocytes: 1 %
Lymphocytes Relative: 34 %
Lymphs Abs: 1.5 10*3/uL (ref 0.7–4.0)
MCH: 25.8 pg — ABNORMAL LOW (ref 26.0–34.0)
MCHC: 31.1 g/dL (ref 30.0–36.0)
MCV: 83 fL (ref 80.0–100.0)
Monocytes Absolute: 0.6 10*3/uL (ref 0.1–1.0)
Monocytes Relative: 13 %
Neutro Abs: 2.1 10*3/uL (ref 1.7–7.7)
Neutrophils Relative %: 47 %
Platelet Count: 250 10*3/uL (ref 150–400)
RBC: 4.93 MIL/uL (ref 3.87–5.11)
RDW: 13.8 % (ref 11.5–15.5)
WBC Count: 4.5 10*3/uL (ref 4.0–10.5)
nRBC: 0 % (ref 0.0–0.2)

## 2020-06-02 MED ORDER — DIPHENHYDRAMINE HCL 25 MG PO CAPS
50.0000 mg | ORAL_CAPSULE | Freq: Once | ORAL | Status: AC
  Administered 2020-06-02: 50 mg via ORAL

## 2020-06-02 MED ORDER — DIPHENHYDRAMINE HCL 25 MG PO CAPS
ORAL_CAPSULE | ORAL | Status: AC
  Filled 2020-06-02: qty 2

## 2020-06-02 MED ORDER — ACETAMINOPHEN 325 MG PO TABS
ORAL_TABLET | ORAL | Status: AC
  Filled 2020-06-02: qty 2

## 2020-06-02 MED ORDER — TRASTUZUMAB-DKST CHEMO 150 MG IV SOLR
450.0000 mg | Freq: Once | INTRAVENOUS | Status: AC
  Administered 2020-06-02: 450 mg via INTRAVENOUS
  Filled 2020-06-02: qty 21.43

## 2020-06-02 MED ORDER — SODIUM CHLORIDE 0.9 % IV SOLN
Freq: Once | INTRAVENOUS | Status: AC
  Filled 2020-06-02: qty 250

## 2020-06-02 MED ORDER — ACETAMINOPHEN 325 MG PO TABS
650.0000 mg | ORAL_TABLET | Freq: Once | ORAL | Status: AC
  Administered 2020-06-02: 650 mg via ORAL

## 2020-06-02 MED ORDER — SODIUM CHLORIDE 0.9% FLUSH
10.0000 mL | Freq: Once | INTRAVENOUS | Status: AC
  Administered 2020-06-02: 10 mL
  Filled 2020-06-02: qty 10

## 2020-06-02 MED ORDER — HEPARIN SOD (PORK) LOCK FLUSH 100 UNIT/ML IV SOLN
500.0000 [IU] | Freq: Once | INTRAVENOUS | Status: AC | PRN
  Administered 2020-06-02: 500 [IU]
  Filled 2020-06-02: qty 5

## 2020-06-02 MED ORDER — SODIUM CHLORIDE 0.9% FLUSH
10.0000 mL | INTRAVENOUS | Status: DC | PRN
  Administered 2020-06-02: 10 mL
  Filled 2020-06-02: qty 10

## 2020-06-02 NOTE — Patient Instructions (Signed)

## 2020-06-02 NOTE — Patient Instructions (Signed)
[facility] Discharge Instructions for Patients Receiving Chemotherapy  Today you received the following chemotherapy agents: Trastuzumab (Ogivri)  To help prevent nausea and vomiting after your treatment, we encourage you to take your nausea medication as prescribed.   If you develop nausea and vomiting that is not controlled by your nausea medication, call the clinic.   BELOW ARE SYMPTOMS THAT SHOULD BE REPORTED IMMEDIATELY:  *FEVER GREATER THAN 100.5 F  *CHILLS WITH OR WITHOUT FEVER  NAUSEA AND VOMITING THAT IS NOT CONTROLLED WITH YOUR NAUSEA MEDICATION  *UNUSUAL SHORTNESS OF BREATH  *UNUSUAL BRUISING OR BLEEDING  TENDERNESS IN MOUTH AND THROAT WITH OR WITHOUT PRESENCE OF ULCERS  *URINARY PROBLEMS  *BOWEL PROBLEMS  UNUSUAL RASH Items with * indicate a potential emergency and should be followed up as soon as possible.  Feel free to call the clinic should you have any questions or concerns. The clinic phone number is [phone].  Please show the [place] at check-in to the Emergency Department and triage nurse.

## 2020-06-02 NOTE — Telephone Encounter (Signed)
Error. Megan Khan, CMA  

## 2020-06-03 ENCOUNTER — Encounter: Payer: Self-pay | Admitting: *Deleted

## 2020-06-05 NOTE — Progress Notes (Signed)
Cardio-Oncology  Note   Date:  06/05/2020   ID:  Megan, Khan [birth year], MRN [id number]  Location: Home  Provider location: [facility] Type of Visit: Established patient  PCP:  Gerlene Fee, DO  Cardiologist:  No primary care provider on file. Primary HF: Lanaiya Lantry  Chief Complaint: Cardio-oncology follow-up   History of Present Illness:  HPI:  Ms. Whitelock is a  64 y.o. female with right breast cancer referred by Dr. Jana Hakim for enrollment into the Cardio-Oncology program.  She has a h/o HTN, HL, DM2, glaucoma with legal blindness. Denied any known heart disease. Pre-chemo echo 04/06/2019  EF 65-70%.  Cancer history:   Underwent right breast upper outer quadrant biopsy 03/11/2019 for a clinical t1b N0, stage IA invasive ductal carcinoma, grade 2, estrogen and progesterone receptor negative, HER-2 amplified, with an MIB-1 of 10%.  (1) Right breast lumpectomy on 05/29/2019: pT1b pN0, stage IA invasive ductal carciinoma, grade 2,  (a) a total of 5 lymph nodes were negative for carcinoma (b) superior margin positive, cleared with addition surgery on 06/10/2019  (c) surgery delayed due to abnormal EKG, patient cleared by Dr. Gwenlyn Found on 04/21/2019  (2) adjuvant chemotherapy to consist of Abraxane weekly x12 with Trastuzumab, starting 07/10/2019 (a) Abraxane given due to diabetes, and inability to tolerate steroid premedication required with Paclitaxel.  (3) trastuzumab to be continued to complete 1 year through 11/21 (a) baseline echo 04/06/2019 shows an ejection fraction greater than 65%  (4) adjuvant radiation therapy from 11/23/2019-12/18/2019  Echo 07/22/19: EF 65-70% Mild MR. Grade 1 DD GLS -16.7%  Echo 12/02/19: EF 60-65% GLS -14.3% (underestimated) Grade 1 DD   Echo  03/02/20: EF 60-65% Grade 1 DD Personally reviewed  Here today for routine f/u. Recently  had some dizziness after Synjardy started. Feels good. No CP or SOB. No edema. Tolerating Herceptin well.    EF 60-65% Grade 1D   Past Medical History:  Diagnosis Date  . Anemia    during pregnancy  . Breast cancer ([place])    right breast 2020  . [facility]) 2020   breast cancer on right  . Diabetes mellitus    Type 2  . Family history of breast cancer   . Family history of throat cancer   . Glaucoma   . Glaucoma   . Hx of colonic polyps   . Hyperlipidemia   . Hypertension   . Legally blind    completely blind in left eye, low vision in right  . Personal history of chemotherapy 07/2019   stopped after 12 treatments  . Personal history of radiation therapy    stopped 12/2019   Past Surgical History:  Procedure Laterality Date  . BREAST EXCISIONAL BIOPSY     right breast 05/2019  . BREAST LUMPECTOMY    . BREAST LUMPECTOMY WITH RADIOACTIVE SEED AND SENTINEL LYMPH NODE BIOPSY Right 05/29/2019   Procedure: RIGHT BREAST LUMPECTOMY WITH RADIOACTIVE SEED AND RIGHT DEEP AXILLARY SENTINEL LYMPH NODE BIOPSY INJECT BLUE DYE;  Surgeon: Fanny Skates, MD;  Location: [facility];  Service: General;  Laterality: Right;  . COLONOSCOPY  01/22/2012   Procedure: COLONOSCOPY;  Surgeon: [name], MD;  Location: [facility];  Service: Endoscopy;  Laterality: N/A;  . COLONOSCOPY    . COLONOSCOPY    . PORTACATH PLACEMENT N/A 05/29/2019   Procedure: INSERTION PORT-A-CATH WITH ULTRASOUND;  Surgeon: Fanny Skates, MD;  Location: [facility];  Service: General;  Laterality: N/A;  .  RE-EXCISION OF BREAST LUMPECTOMY Right 06/10/2019   Procedure: RE-EXCISION OF RIGHT BREAST LUMPECTOMY MARGINS;  Surgeon: Fanny Skates, MD;  Location: [facility];  Service: General;  Laterality: Right;  . TUBAL LIGATION       Current Outpatient Medications  Medication Sig Dispense Refill  . amLODipine (NORVASC) 10 MG tablet Take 1 tablet (10 mg total) by mouth daily. 30 tablet 2  . bimatoprost (LUMIGAN) 0.01 % SOLN Place 2  drops into the left eye at bedtime.     . blood glucose meter kit and supplies KIT Dispense based on patient and insurance preference. Use up to four times daily as directed. (FOR ICD-9 250.00, 250.01). 1 each 12  . Blood Glucose Monitoring Suppl (PRODIGY AUTOCODE BLOOD GLUCOSE) w/Device KIT 1 kit by Does not apply route in the morning, at noon, and at bedtime. Any brand acceptable if it works for patient 1 kit 0  . dorzolamide (TRUSOPT) 2 % ophthalmic solution Place 2 drops into the left eye 3 (three) times daily.     Marland Kitchen gabapentin (NEURONTIN) 100 MG capsule TAKE 2 CAPSULES(200 MG) BY MOUTH THREE TIMES DAILY 180 capsule 0  . lidocaine-prilocaine (EMLA) cream Apply to affected area once (Patient taking differently: Apply 1 application topically daily as needed. Port: Apply to affected area once) 30 g 3  . lisinopril-hydrochlorothiazide (ZESTORETIC) 20-12.5 MG tablet TAKE 2 TABLETS BY MOUTH DAILY 60 tablet 6  . lisinopril-hydrochlorothiazide (ZESTORETIC) 20-25 MG tablet TAKE 1 TABLET BY MOUTH DAILY 90 tablet 3  . lovastatin (MEVACOR) 20 MG tablet Take 1 tablet (20 mg total) by mouth at bedtime. 90 tablet 3  . metoprolol succinate (TOPROL-XL) 50 MG 24 hr tablet TAKE 1 TABLET(50 MG) BY MOUTH DAILY 90 tablet 1  . potassium chloride SA (KLOR-CON) 20 MEQ tablet Take 1 tablet (20 mEq total) by mouth daily. 14 tablet 0  . spironolactone (ALDACTONE) 25 MG tablet Take 0.5 tablets (12.5 mg total) by mouth daily. 45 tablet 3  . SYNJARDY 12.12-998 MG TABS TAKE 1 TABLET BY MOUTH TWICE DAILY 60 tablet 0   No current facility-administered medications for this encounter.    Allergies:   Sulfamethoxazole   Social History:  The patient  reports that she quit smoking about 24 years ago. Her smoking use included cigarettes. She has never used smokeless tobacco. She reports that she does not drink alcohol and does not use drugs.   Family History:  The patient's family history includes Breast cancer in her cousin;  Breast cancer (age of onset: 23) in her sister.   ROS:  Please see the history of present illness.   All other systems are personally reviewed and negative.   Vitals:   06/06/20 1209 06/06/20 1214  BP: 122/78 112/76  Pulse: 66   SpO2: 97%     Exam:   General:  Well appearing. No resp difficulty HEENT: normal Neck: supple. no JVD. Carotids 2+ bilat; no bruits. No lymphadenopathy or thryomegaly appreciated. Cor: PMI nondisplaced. Regular rate & rhythm. No rubs, gallops or murmurs. Lungs: clear Abdomen: soft, nontender, nondistended. No hepatosplenomegaly. No bruits or masses. Good bowel sounds. Extremities: no cyanosis, clubbing, rash, edema Neuro: alert & orientedx3, cranial nerves grossly intact. moves all 4 extremities w/o difficulty. Affect pleasant  Recent Labs: 10/18/2019: Magnesium 2.1 06/02/2020: ALT 26; BUN 16; Creatinine 1.07; Hemoglobin 12.7; Platelet Count 250; Potassium 3.6; Sodium 138  Personally reviewed   Wt Readings from Last 3 Encounters:  04/27/20 72.7 kg (160 lb 3.2 oz)  03/10/20 71.4  kg (157 lb 6.4 oz)  03/02/20 70.8 kg (156 lb)      ASSESSMENT AND PLAN:  1. Right breast cancer  - I reviewed echos personally. - Echo today 06/06/20 EF 60-65% Grade I DD - I reviewed echos personally. EF and Doppler parameters stable. No HF on exam. Continue Herceptin.    2. HTN  - On lisinopril/HCTZ 40/25.  - Amlodipine 10 - Spiro 12,5  - Synjardy recently added for DM2 and caused  dizziness  - Suspect dizziness due to volume depletion with 3 diuretics. She is mildly orthostatic on exam today. Switch lisinopril/HCTZ to lisinopril 40 daily (no HCTZ)    Signed, Glori Bickers, MD  06/05/2020 9:55 PM

## 2020-06-06 ENCOUNTER — Encounter: Payer: Self-pay | Admitting: Licensed Clinical Social Worker

## 2020-06-06 ENCOUNTER — Ambulatory Visit (HOSPITAL_BASED_OUTPATIENT_CLINIC_OR_DEPARTMENT_OTHER)
Admission: RE | Admit: 2020-06-06 | Discharge: 2020-06-06 | Disposition: A | Discharge status: Home / Self Care | Payer: Medicare Other | Source: Ambulatory Visit | Attending: Internal Medicine | Admitting: Internal Medicine

## 2020-06-06 ENCOUNTER — Encounter (HOSPITAL_COMMUNITY): Payer: Self-pay | Admitting: Internal Medicine

## 2020-06-06 ENCOUNTER — Ambulatory Visit (HOSPITAL_COMMUNITY)
Admission: RE | Admit: 2020-06-06 | Discharge: 2020-06-06 | Disposition: A | Discharge status: Home / Self Care | Payer: Medicare Other | Source: Ambulatory Visit | Attending: Internal Medicine | Admitting: Internal Medicine

## 2020-06-06 ENCOUNTER — Telehealth (HOSPITAL_COMMUNITY): Payer: Self-pay | Admitting: Cardiology

## 2020-06-06 ENCOUNTER — Other Ambulatory Visit: Payer: Self-pay

## 2020-06-06 VITALS — BP 112/76 | HR 66 | Wt 158.4 lb

## 2020-06-06 DIAGNOSIS — Z7982 Long term (current) use of aspirin: Secondary | ICD-10-CM | POA: Diagnosis not present

## 2020-06-06 DIAGNOSIS — Z803 Family history of malignant neoplasm of breast: Secondary | ICD-10-CM | POA: Insufficient documentation

## 2020-06-06 DIAGNOSIS — Z87891 Personal history of nicotine dependence: Secondary | ICD-10-CM | POA: Insufficient documentation

## 2020-06-06 DIAGNOSIS — C50411 Malignant neoplasm of upper-outer quadrant of right female breast: Secondary | ICD-10-CM | POA: Diagnosis not present

## 2020-06-06 DIAGNOSIS — Z5181 Encounter for therapeutic drug level monitoring: Secondary | ICD-10-CM | POA: Insufficient documentation

## 2020-06-06 DIAGNOSIS — E119 Type 2 diabetes mellitus without complications: Secondary | ICD-10-CM | POA: Insufficient documentation

## 2020-06-06 DIAGNOSIS — H409 Unspecified glaucoma: Secondary | ICD-10-CM | POA: Insufficient documentation

## 2020-06-06 DIAGNOSIS — E785 Hyperlipidemia, unspecified: Secondary | ICD-10-CM | POA: Insufficient documentation

## 2020-06-06 DIAGNOSIS — Z171 Estrogen receptor negative status [ER-]: Secondary | ICD-10-CM

## 2020-06-06 DIAGNOSIS — Z882 Allergy status to sulfonamides status: Secondary | ICD-10-CM | POA: Insufficient documentation

## 2020-06-06 DIAGNOSIS — H548 Legal blindness, as defined in USA: Secondary | ICD-10-CM | POA: Diagnosis not present

## 2020-06-06 DIAGNOSIS — Z0189 Encounter for other specified special examinations: Secondary | ICD-10-CM | POA: Diagnosis not present

## 2020-06-06 DIAGNOSIS — Z79899 Other long term (current) drug therapy: Secondary | ICD-10-CM | POA: Diagnosis not present

## 2020-06-06 DIAGNOSIS — I10 Essential (primary) hypertension: Secondary | ICD-10-CM | POA: Insufficient documentation

## 2020-06-06 LAB — ECHOCARDIOGRAM COMPLETE
Area-P 1/2: 2.11 cm2
S' Lateral: 2.9 cm

## 2020-06-06 MED ORDER — LISINOPRIL 40 MG PO TABS: 40.0000 mg | ORAL_TABLET | Freq: Every day | ORAL | 6 refills | Status: DC

## 2020-06-06 NOTE — Progress Notes (Signed)
  Echocardiogram 2D Echocardiogram has been performed.  Andrzej Scully G Kristeena Meineke 06/06/2020, 11:56 AM

## 2020-06-06 NOTE — Progress Notes (Signed)
[facility] [employer]  CSW received call from pt's sister Megan Khan [phone]) stating that pt needs help with housing. Currently has Section 8 but current home is being sold, so pt needs to move and they are having trouble finding another place for her.  There are limited housing resources available at this time. CSW informed of [employer] to search for low-income and section 8 housing. Also provided information on [place] and on [employer] through DSS. Sister will explore these options and call back if needed. If they are unable to find a new place for pt before 11/3, then she will move in with one of her sisters in the meantime.   Edwinna Areola [name], LCSW

## 2020-06-06 NOTE — Patient Instructions (Signed)
Stop Lisinopril/HCTZ  Start Lisinopril 40 mg Daily  Follow up as needed

## 2020-06-06 NOTE — Telephone Encounter (Signed)
STAT records request returned to [facility] Most recent [place], and ECHO returned to [phone]

## 2020-06-09 ENCOUNTER — Telehealth: Payer: Self-pay

## 2020-06-09 NOTE — Telephone Encounter (Signed)
Pt called requested Dr Janus Molder call [place], for her diabetic supplies @ [phone] to confirm that she is now her PCP. The company still has Dr. [name] listed

## 2020-06-20 NOTE — Telephone Encounter (Signed)
Called and confirmed that I was the documented PCP for this patient.   Gerlene Fee, DO 06/20/2020, 6:15 PM PGY-2, [place]

## 2020-06-23 ENCOUNTER — Inpatient Hospital Stay: Payer: Medicare Other | Attending: Oncology | Admitting: Adult Health

## 2020-06-23 ENCOUNTER — Other Ambulatory Visit: Payer: Self-pay

## 2020-06-23 VITALS — BP 138/91 | HR 65 | Temp 97.4°F | Resp 18 | Ht 61.5 in | Wt 161.5 lb

## 2020-06-23 DIAGNOSIS — C50411 Malignant neoplasm of upper-outer quadrant of right female breast: Secondary | ICD-10-CM | POA: Diagnosis not present

## 2020-06-23 DIAGNOSIS — Z171 Estrogen receptor negative status [ER-]: Secondary | ICD-10-CM | POA: Diagnosis not present

## 2020-06-23 DIAGNOSIS — Z853 Personal history of malignant neoplasm of breast: Secondary | ICD-10-CM | POA: Insufficient documentation

## 2020-06-23 NOTE — Progress Notes (Signed)
SURVIVORSHIP VISIT:     BRIEF ONCOLOGIC HISTORY:  Oncology History  Malignant neoplasm of upper-outer quadrant of right breast in female, estrogen receptor negative ([place])  03/19/2019 Initial Diagnosis   Malignant neoplasm of upper-outer quadrant of right breast in female, estrogen receptor negative ([place])   05/29/2019 Cancer Staging   Staging form: Breast, AJCC 8th Edition - Pathologic stage from 05/29/2019: Stage IA (pT1b, pN0, cM0, G2, ER-, PR-, HER2+)   05/29/2019 Surgery   Right lumpectomy Dalbert Batman) [phone]): IDC with high grade DCIS with necrosis, grade 2, ER/PR -, HER-2 +. 5 lymph nodes negative for carcinoma. Superior margin was positive, cleared with additional surgery on 06/10/2019.   07/10/2019 - 06/02/2020 Chemotherapy   PACLitaxel-protein bound (ABRAXANE) chemo infusion 175 mg, 100 mg/m2 = 175 mg (100 % of original dose 100 mg/m2), Intravenous,  Once, 3 of 3 cycles. Dose modification: 100 mg/m2 (original dose 100 mg/m2, Cycle 1). Administration: 175 mg (07/10/2019), 175 mg (07/17/2019), 175 mg (08/06/2019), 175 mg (07/23/2019), 175 mg (07/30/2019), 175 mg (08/12/2019), 175 mg (09/24/2019), 175 mg (08/20/2019), 175 mg (08/27/2019), 175 mg (09/03/2019), 175 mg (09/10/2019), 175 mg (09/17/2019)  trastuzumab-dkst (OGIVRI) 300 mg in sodium chloride 0.9 % 250 mL chemo infusion, 294 mg, Intravenous,  Once, 14 of 15 cycles. Dose modification: 150 mg (original dose 2 mg/kg, Cycle 1, Reason: Other (see comments), Comment: rounded; vial size), 450 mg (original dose 450 mg, Cycle 4, Reason: Other (see comments), Comment: dose rounded for vial size), 450 mg (original dose 2 mg/kg, Cycle 3, Reason: Other (see comments), Comment: maint trastuzumab). Administration: 300 mg (07/10/2019), 150 mg (07/17/2019), 150 mg (08/06/2019), 450 mg (10/15/2019), 450 mg (11/25/2019), 450 mg (01/28/2020), 450 mg (02/18/2020), 450 mg (04/21/2020), 450 mg (05/12/2020), 150 mg (07/23/2019), 150 mg (07/30/2019), 150 mg (08/12/2019),  450 mg (09/24/2019), 150 mg (08/20/2019), 150 mg (08/27/2019), 150 mg (09/03/2019), 150 mg (09/10/2019), 150 mg (09/17/2019), 450 mg (12/17/2019), 450 mg (01/07/2020), 450 mg (03/10/2020), 450 mg (03/31/2020), 450 mg (06/02/2020)   11/23/2019 - 12/18/2019 Radiation Therapy   The patient initially received a dose of 42.56 Gy in 16 fractions to the breast using whole-breast tangent fields. This was delivered using a 3-D conformal technique. The patient then received a boost to the seroma. This delivered an additional 8 Gy in 90factions using a 3 field photon technique due to the depth of the seroma. The total dose was 50.56 Gy.     INTERVAL HISTORY:  Ms. BSteptoeto review her survivorship care plan detailing her treatment course for breast cancer, as well as monitoring long-term side effects of that treatment, education regarding health maintenance, screening, and overall wellness and health promotion.     Overall, Ms. BSchoeppnerreports feeling quite well.  She completed a survivorship survey and indicated that she is doing quite well today.    REVIEW OF SYSTEMS:  Review of Systems  Constitutional: Negative for appetite change, chills, fatigue, fever and unexpected weight change.  HENT:   Negative for hearing loss, lump/mass and trouble swallowing.   Eyes: Negative for eye problems and icterus.  Respiratory: Negative for chest tightness, cough and shortness of breath.   Cardiovascular: Negative for chest pain, leg swelling and palpitations.  Gastrointestinal: Negative for abdominal distention, abdominal pain, constipation, diarrhea, nausea and vomiting.  Endocrine: Negative for hot flashes.  Genitourinary: Negative for difficulty urinating.   Musculoskeletal: Negative for arthralgias.  Skin: Negative for itching and rash.  Neurological: Negative for dizziness, extremity weakness, headaches and numbness.  Hematological: Negative for adenopathy. Does  not bruise/bleed easily.  Psychiatric/Behavioral: Negative  for depression. The patient is not nervous/anxious.    Breast: Denies any new nodularity, masses, tenderness, nipple changes, or nipple discharge.      ONCOLOGY TREATMENT TEAM:  1. Surgeon:  Dr. Dalbert Batman at [facility] 2. Medical Oncologist: Dr. Jana Hakim  3. Radiation Oncologist: Dr. Lisbeth Renshaw    PAST MEDICAL/SURGICAL HISTORY:  Past Medical History:  Diagnosis Date  . Anemia    during pregnancy  . Breast cancer ([place])    right breast 2020  . Cancer [facility]) 2020   breast cancer on right  . Diabetes mellitus    Type 2  . Family history of breast cancer   . Family history of throat cancer   . Glaucoma   . Glaucoma   . Hx of colonic polyps   . Hyperlipidemia   . Hypertension   . Legally blind    completely blind in left eye, low vision in right  . Personal history of chemotherapy 07/2019   stopped after 12 treatments  . Personal history of radiation therapy    stopped 12/2019   Past Surgical History:  Procedure Laterality Date  . BREAST EXCISIONAL BIOPSY     right breast 05/2019  . BREAST LUMPECTOMY    . BREAST LUMPECTOMY WITH RADIOACTIVE SEED AND SENTINEL LYMPH NODE BIOPSY Right 05/29/2019   Procedure: RIGHT BREAST LUMPECTOMY WITH RADIOACTIVE SEED AND RIGHT DEEP AXILLARY SENTINEL LYMPH NODE BIOPSY INJECT BLUE DYE;  Surgeon: Fanny Skates, MD;  Location: [facility];  Service: General;  Laterality: Right;  . COLONOSCOPY  01/22/2012   Procedure: COLONOSCOPY;  Surgeon: [name], MD;  Location: [facility];  Service: Endoscopy;  Laterality: N/A;  . COLONOSCOPY    . COLONOSCOPY    . PORTACATH PLACEMENT N/A 05/29/2019   Procedure: INSERTION PORT-A-CATH WITH ULTRASOUND;  Surgeon: Fanny Skates, MD;  Location: [place];  Service: General;  Laterality: N/A;  . RE-EXCISION OF BREAST LUMPECTOMY Right 06/10/2019   Procedure: RE-EXCISION OF RIGHT BREAST LUMPECTOMY MARGINS;  Surgeon: Fanny Skates, MD;  Location: [place];  Service: General;  Laterality: Right;  . TUBAL  LIGATION       ALLERGIES:  Allergies  Allergen Reactions  . Sulfamethoxazole Rash    REACTION: Questionable allergy ?     CURRENT MEDICATIONS:  Outpatient Encounter Medications as of 06/23/2020  Medication Sig  . amLODipine (NORVASC) 10 MG tablet Take 1 tablet (10 mg total) by mouth daily.  . bimatoprost (LUMIGAN) 0.01 % SOLN Place 2 drops into the left eye at bedtime.   . blood glucose meter kit and supplies KIT Dispense based on patient and insurance preference. Use up to four times daily as directed. (FOR ICD-9 250.00, 250.01).  . Blood Glucose Monitoring Suppl (PRODIGY AUTOCODE BLOOD GLUCOSE) w/Device KIT 1 kit by Does not apply route in the morning, at noon, and at bedtime. Any brand acceptable if it works for patient  . dorzolamide (TRUSOPT) 2 % ophthalmic solution Place 2 drops into the left eye 3 (three) times daily.   Marland Kitchen gabapentin (NEURONTIN) 100 MG capsule TAKE 2 CAPSULES(200 MG) BY MOUTH THREE TIMES DAILY  . lidocaine-prilocaine (EMLA) cream Apply to affected area once (Patient taking differently: Apply 1 application topically daily as needed. Port: Apply to affected area once)  . lisinopril (ZESTRIL) 40 MG tablet Take 1 tablet (40 mg total) by mouth daily.  Marland Kitchen lovastatin (MEVACOR) 20 MG tablet Take 1 tablet (20 mg total) by mouth at bedtime.  . metoprolol succinate (  TOPROL-XL) 50 MG 24 hr tablet TAKE 1 TABLET(50 MG) BY MOUTH DAILY  . potassium chloride SA (KLOR-CON) 20 MEQ tablet Take 1 tablet (20 mEq total) by mouth daily.  Marland Kitchen spironolactone (ALDACTONE) 25 MG tablet Take 0.5 tablets (12.5 mg total) by mouth daily.  Marland Kitchen SYNJARDY 12.12-998 MG TABS TAKE 1 TABLET BY MOUTH TWICE DAILY  . [DISCONTINUED] Glucose Blood (BLOOD GLUCOSE TEST STRIPS) STRP 1 strip by In Vitro route 3 (three) times daily.   No facility-administered encounter medications on file as of 06/23/2020.     ONCOLOGIC FAMILY HISTORY:  Family History  Problem Relation Age of Onset  . Breast cancer Sister 65        Her2 +  . Breast cancer Cousin        maternal second cousin, Bilateral mastectomy, diagnosed in her 2s     GENETIC COUNSELING/TESTING: Declined by patient on 04/02/2019  SOCIAL HISTORY:  Social History   Socioeconomic History  . Marital status: Single    Spouse name: Not on file  . Number of children: Not on file  . Years of education: Not on file  . Highest education level: Not on file  Occupational History  . Not on file  Tobacco Use  . Smoking status: Former Smoker    Types: Cigarettes    Quit date: 09/23/1995    Years since quitting: 24.7  . Smokeless tobacco: Never Used  Vaping Use  . Vaping Use: Never used  Substance and Sexual Activity  . Alcohol use: No  . Drug use: No  . Sexual activity: Not on file  Other Topics Concern  . Not on file  Social History Narrative  . Not on file   Social Determinants of Health   Financial Resource Strain:   . Difficulty of Paying Living Expenses: Not on file  Food Insecurity:   . Worried About Charity fundraiser in the Last Year: Not on file  . Ran Out of Food in the Last Year: Not on file  Transportation Needs:   . Lack of Transportation (Medical): Not on file  . Lack of Transportation (Non-Medical): Not on file  Physical Activity:   . Days of Exercise per Week: Not on file  . Minutes of Exercise per Session: Not on file  Stress:   . Feeling of Stress : Not on file  Social Connections:   . Frequency of Communication with Friends and Family: Not on file  . Frequency of Social Gatherings with Friends and Family: Not on file  . Attends Religious Services: Not on file  . Active Member of Clubs or Organizations: Not on file  . Attends Archivist Meetings: Not on file  . Marital Status: Not on file  Intimate Partner Violence:   . Fear of Current or Ex-Partner: Not on file  . Emotionally Abused: Not on file  . Physically Abused: Not on file  . Sexually Abused: Not on file     OBSERVATIONS/OBJECTIVE:   BP (!) 138/91 (BP Location: Left Arm, Patient Position: Sitting)   Pulse 65   Temp (!) 97.4 F (36.3 C) (Tympanic)   Resp 18   Ht 5' 1.5" (1.562 m)   Wt 161 lb 8 oz (73.3 kg)   SpO2 96%   BMI 30.02 kg/m  GENERAL: Patient is a well appearing female in no acute distress HEENT:  Sclerae anicteric.  Oropharynx clear and moist. No ulcerations or evidence of oropharyngeal candidiasis. Neck is supple.  NODES:  No cervical, supraclavicular,  or axillary lymphadenopathy palpated.  BREAST EXAM:  Right breast s/p lumpectomy and radiation no sign of local recurrence, left breast benign LUNGS:  Clear to auscultation bilaterally.  No wheezes or rhonchi. HEART:  Regular rate and rhythm. No murmur appreciated. ABDOMEN:  Soft, nontender.  Positive, normoactive bowel sounds. No organomegaly palpated. MSK:  No focal spinal tenderness to palpation. Full range of motion bilaterally in the upper extremities. EXTREMITIES:  No peripheral edema.   SKIN:  Clear with no obvious rashes or skin changes. No nail dyscrasia. NEURO:  Nonfocal. Well oriented.  Appropriate affect.    LABORATORY DATA:  None for this visit.  DIAGNOSTIC IMAGING:   CLINICAL DATA:  64 year old female presenting for routine annual surveillance status post right breast lumpectomy in 2020.  EXAM: DIGITAL DIAGNOSTIC BILATERAL MAMMOGRAM WITH TOMO AND CAD  COMPARISON:  Previous exam(s).  ACR Breast Density Category b: There are scattered areas of fibroglandular density.  FINDINGS: Expected surgical changes noted in the upper-outer quadrant of the right breast consistent with history of lumpectomy. No suspicious calcifications, masses or areas of distortion are seen in the bilateral breasts.  Mammographic images were processed with CAD.  IMPRESSION: Expected surgical changes in the upper-outer right breast consistent with history of lumpectomy. No mammographic evidence of malignancy in the bilateral  breasts.  RECOMMENDATION: Diagnostic mammogram is suggested in 1 year. (Code:[id number]-B-01Y)  I have discussed the findings and recommendations with the patient. If applicable, a reminder letter will be sent to the patient regarding the next appointment.  BI-RADS CATEGORY  2: Benign.   Electronically Signed   By: Ammie Ferrier M.D.   On: 05/24/2020 12:44    ASSESSMENT AND PLAN:  Ms.. Burling is a pleasant 64 y.o. female with Stage IA right breast invasive ductal carcinoma, ER-/PR-/HER2+, diagnosed in 03/2019, treated with lumpectomy, adjuvant chemotherapy, maintenance trastuzumab x 1 year, and adjuvant radiation therapy.  She presents to the Survivorship Clinic for our initial meeting and routine follow-up post-completion of treatment for breast cancer.    1. Stage IA right breast cancer:  [name] is continuing to recover from definitive treatment for breast cancer. She will follow-up with her medical oncologist, Dr. Jana Hakim in 6 months with history and physical exam per surveillance protocol. Her mammogram is due 05/2021.  Her breast density is category B.   Today, a comprehensive survivorship care plan and treatment summary was reviewed with the patient today detailing her breast cancer diagnosis, treatment course, potential late/long-term effects of treatment, appropriate follow-up care with recommendations for the future, and patient education resources.  A copy of this summary, along with a letter will be sent to the patient's primary care provider via mail/fax/In Basket message after today's visit.    2. Bone health: Marland Kitchen  She was given education on specific activities to promote bone health.  3. Cancer screening:  Due to Ms. Parlett's history and her age, she should receive screening for skin cancers, colon cancer, and gynecologic cancers.  The information and recommendations are listed on the patient's comprehensive care plan/treatment summary and were reviewed in detail  with the patient.    4. Health maintenance and wellness promotion: Ms. Auer was encouraged to consume 5-7 servings of fruits and vegetables per day. We reviewed the "Nutrition Rainbow" handout, as well as the handout "Take Control of Your Health and Reduce Your Cancer Risk" from the [place].  She was also encouraged to engage in moderate to vigorous exercise for 30 minutes per day most days of  the week. We discussed the [employer] fitness program, which is designed for cancer survivors to help them become more physically fit after cancer treatments.  She was instructed to limit her alcohol consumption and continue to abstain from tobacco use.     5. Support services/counseling: It is not uncommon for this period of the patient's cancer care trajectory to be one of many emotions and stressors.  We discussed how this can be increasingly difficult during the times of quarantine and social distancing due to the COVID-19 pandemic.   She was given information regarding our available services and encouraged to contact me with any questions or for help enrolling in any of our support group/programs.    Follow up instructions:    -Return to cancer center in 6 months for labs and f/u with Dr. Jana Hakim  -Mammogram due in 05/2021 -Follow up with surgery for port removal -She is welcome to return back to the Survivorship Clinic at any time; no additional follow-up needed at this time.  -Consider referral back to survivorship as a long-term survivor for continued surveillance  The patient was provided an opportunity to ask questions and all were answered. The patient agreed with the plan and demonstrated an understanding of the instructions.   Total encounter time: 30 minutes*  Wilber Bihari, NP 06/27/20 9:33 AM Medical Oncology and Hematology [facility] [place], [place] [address] Tel. [phone]    Fax. [phone]  *Total Encounter Time as  defined by the Centers for Medicare and Medicaid Services includes, in addition to the face-to-face time of a patient visit (documented in the note above) non-face-to-face time: obtaining and reviewing outside history, ordering and reviewing medications, tests or procedures, care coordination (communications with other health care professionals or caregivers) and documentation in the medical record.

## 2020-06-28 ENCOUNTER — Other Ambulatory Visit: Payer: Self-pay | Admitting: Family Medicine

## 2020-06-28 DIAGNOSIS — E1122 Type 2 diabetes mellitus with diabetic chronic kidney disease: Secondary | ICD-10-CM

## 2020-06-28 DIAGNOSIS — N181 Chronic kidney disease, stage 1: Secondary | ICD-10-CM

## 2020-06-29 ENCOUNTER — Telehealth: Payer: Self-pay

## 2020-06-29 NOTE — Telephone Encounter (Signed)
Patient calls nurse line requesting 3 month supply of medication.   To PCP  Talbot Grumbling, RN

## 2020-06-29 NOTE — Telephone Encounter (Signed)
Opened in error.   Tacari Repass C Lizvette Lightsey, RN  

## 2020-06-30 ENCOUNTER — Other Ambulatory Visit: Payer: Self-pay | Admitting: Family Medicine

## 2020-07-04 ENCOUNTER — Other Ambulatory Visit: Payer: Self-pay | Admitting: General Surgery

## 2020-07-13 ENCOUNTER — Ambulatory Visit: Payer: Medicare Other | Admitting: Family Medicine

## 2020-07-18 ENCOUNTER — Encounter: Payer: Self-pay | Admitting: Family Medicine

## 2020-07-18 ENCOUNTER — Ambulatory Visit (INDEPENDENT_AMBULATORY_CARE_PROVIDER_SITE_OTHER): Payer: Medicare Other | Admitting: Family Medicine

## 2020-07-18 ENCOUNTER — Other Ambulatory Visit: Payer: Self-pay

## 2020-07-18 VITALS — BP 104/62 | HR 73 | Wt 161.8 lb

## 2020-07-18 DIAGNOSIS — E1122 Type 2 diabetes mellitus with diabetic chronic kidney disease: Secondary | ICD-10-CM | POA: Diagnosis not present

## 2020-07-18 DIAGNOSIS — N181 Chronic kidney disease, stage 1: Secondary | ICD-10-CM | POA: Diagnosis not present

## 2020-07-18 DIAGNOSIS — I10 Essential (primary) hypertension: Secondary | ICD-10-CM | POA: Diagnosis not present

## 2020-07-18 LAB — POCT GLYCOSYLATED HEMOGLOBIN (HGB A1C): Hemoglobin A1C: 8.7 % — AB (ref 4.0–5.6)

## 2020-07-18 NOTE — Progress Notes (Signed)
    SUBJECTIVE:   CHIEF COMPLAINT / HPI:   Megan Khan is a 64 year old female who presents for the issues below.  Diabetes Current Regimen: Synjardy 12.12-998 mg tab twice daily CBGs: 95-300s; 120s in the a.m. 200s in the p.m. Last A1c: 8.6 on 04/28/2020 Denies polyuria, polydipsia, hypoglycemia Last Eye Exam: 3 weeks prior Statin: Lovastatin 20 mg nightly ACE/ARB: Lisinopril 40 mg daily  Hypertension: - Medications: Amlodipine 10 mg daily lisinopril 40 mg daily, metoprolol 50 mg daily, spironolactone 25 mg daily - Compliance: Yes - Checking BP at home: No - Denies any SOB, CP, vision changes, LE edema, medication SEs, or symptoms of hypotension - Diet: Increased and fast food on a daily basis patient is in transition with moving.  PERTINENT  PMH / PSH: Left eye blindness status post recent right eye surgery for glaucoma removal, breast cancer treatment; port to be removed in January.  OBJECTIVE:   BP 104/62   Pulse 73   Wt 161 lb 12.8 oz (73.4 kg)   SpO2 96%   BMI 30.08 kg/m    General: Appears well, no acute distress. Age appropriate. Cardiac: RRR, normal heart sounds, no murmurs Respiratory: CTAB, normal effort Neuro: alert and oriented Psych: normal affect  ASSESSMENT/PLAN:   Diabetes mellitus, type 2 (HCC) Stable. Discussed improvement with diet when moving arrangements are secure.  -Continue synjardy at current dose -Follow up in 6 months for A1c  HYPERTENSION, BENIGN SYSTEMIC BP well controlled on current medications.  -Continue medications as above.      Gerlene Fee, [place]

## 2020-07-18 NOTE — Patient Instructions (Signed)
It was wonderful to see you today.  Please bring ALL of your medications with you to every visit.   Today we talked about:  Diabetes. We do not need to make any medication changes today. But we did discuss cleaning up our dietary habit. Continue syndjardy.   Your blood pressure looks wonderful.   Please be sure to schedule follow up at the front  desk before you leave today.   Please call the clinic at [phone] if your symptoms worsen or you have any concerns. It was our pleasure to serve you.  Dr. Janus Molder

## 2020-07-20 NOTE — Assessment & Plan Note (Signed)
Stable. Discussed improvement with diet when moving arrangements are secure.  -Continue synjardy at current dose -Follow up in 6 months for A1c

## 2020-07-20 NOTE — Assessment & Plan Note (Signed)
BP well controlled on current medications.  -Continue medications as above.

## 2020-07-30 ENCOUNTER — Other Ambulatory Visit: Payer: Self-pay | Admitting: Family Medicine

## 2020-07-30 DIAGNOSIS — G8929 Other chronic pain: Secondary | ICD-10-CM

## 2020-08-18 ENCOUNTER — Other Ambulatory Visit: Payer: Self-pay

## 2020-08-18 ENCOUNTER — Encounter (HOSPITAL_BASED_OUTPATIENT_CLINIC_OR_DEPARTMENT_OTHER): Payer: Self-pay | Admitting: General Surgery

## 2020-08-19 NOTE — H&P (Signed)
Megan Khan Location: [place] [facility] Patient #: [id number] DOB: 07/06/1956 Single / Language: Megan Khan / Race: Black or African American Female   History of Present Illness The patient is a 65 year old female who presents for a follow-up for Breast cancer. This is a very pleasant 65 year old female who was diagnosed with right breast cancer 03/2019. This was a pT1bN0 -/-/+ tumor. On May 29, 2019 she underwent Port-A-Cath insertion, right lumpectomy and sentinel node biopsy, and reexcision of superior margin by Dr. Dalbert Batman. 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 The superior margin was broadly positive for DCIS. The inferior margin was less than 1 mm for DCIS. Receptor negative. HER-2 positive. On June 10, 2019 she underwent reexcision of superior and inferior margins by Dalbert Batman and they were negative for invasive cancer.  Significant comorbidities include legal blindness, non-insulin-dependent diabetes, hypertension next one sister was treated for breast cancer by Dr. Jana Hakim and Dr. Barry Dienes. A maternal cousin was treated for breast cancer by Dr. Dalbert Batman. Genetic testing has been negative. She has completed chemo other than herceptin XRT. She has some hyperpigmentation and some occasional sharp breast pains.    Pt is here for follow up. She has no new health problems. She denies any breast pain at this point. She had annual mammogram and it was negative for concerns about malignancy.   Dx Mammogram 05/2020 ACR Breast Density Category b: There are scattered areas of fibroglandular density.  FINDINGS: Expected surgical changes noted in the upper-outer quadrant of the right breast consistent with history of lumpectomy. No suspicious calcifications, masses or areas of distortion are seen in the bilateral breasts.  Mammographic images were processed with CAD.  IMPRESSION: Expected surgical changes in the upper-outer  right breast consistent with history of lumpectomy. No mammographic evidence of malignancy in the bilateral breasts.  RECOMMENDATION: Diagnostic mammogram is suggested in 1 year. (Code:[id number]-01Y)  I have discussed the findings and recommendations with the patient. If applicable, a reminder letter will be sent to the patient regarding the next appointment.  BI-RADS CATEGORY 2: Benign.    Allergies  SulfADIAZINE *SULFONAMIDES*  Allergies Reconciled   Medication History Metoprolol Succinate ER (50MG  Tablet ER 24HR, Oral) Active. Synjardy (12.5-1000MG  Tablet, Oral) Active. Lisinopril (40MG  Tablet, Oral) Active. Lovastatin (20MG  Tablet, Oral) Active. AmLODIPine Besylate (10MG  Tablet, Oral) Active. Gabapentin (100MG  Capsule, Oral) Active. Dorzolamide HCl (2% Solution, Ophthalmic) Active. Medications Reconciled    Review of Systems All other systems negative  Vitals  Weight: 159.4 lb Height: 61in Body Surface Area: 1.72 m Body Mass Index: 30.12 kg/m  Temp.: 97.22F  Pulse: 76 (Regular)  BP: 128/84(Sitting, Left Arm, Standard)       Physical Exam General Mental Status-Alert. General Appearance-Consistent with stated age. Hydration-Well hydrated. Voice-Normal.  Head and Neck Head-normocephalic, atraumatic with no lesions or palpable masses.  Eye Sclera/Conjunctiva - Bilateral-No scleral icterus.  Chest and Lung Exam Chest and lung exam reveals -quiet, even and easy respiratory effort with no use of accessory muscles. Inspection Chest Wall - Normal. Back - normal.  Breast Note: Right breast with some contraction and hyperpigmentation. Contour abnormality UOQ right. no palpable masses. No skin dimpling. minimal right breast lymphedema. port in place on the right. no LAD. no nipple retraction or nipple discharge. left breast normal other than a few cherry angiomata on the skin.   Cardiovascular Cardiovascular examination  reveals -normal pedal pulses bilaterally. Note: regular rate and rhythm  Abdomen Inspection-Inspection Normal. Palpation/Percussion Palpation and  Percussion of the abdomen reveal - Soft, Non Tender, No Rebound tenderness, No Rigidity (guarding) and No hepatosplenomegaly.  Peripheral Vascular Upper Extremity Inspection - Bilateral - Normal - No Clubbing, No Cyanosis, No Edema, Pulses Intact. Lower Extremity Palpation - Edema - Bilateral - No edema - Bilateral.  Neurologic Neurologic evaluation reveals -alert and oriented x 3 with no impairment of recent or remote memory. Mental Status-Normal.  Musculoskeletal Global Assessment -Note: no gross deformities.  Normal Exam - Left-Upper Extremity Strength Normal and Lower Extremity Strength Normal. Normal Exam - Right-Upper Extremity Strength Normal and Lower Extremity Strength Normal.  Lymphatic Head & Neck  General Head & Neck Lymphatics: Bilateral - Description - Normal. Axillary  General Axillary Region: Bilateral - Description - Normal. Tenderness - Non Tender.    Assessment & Plan PRIMARY CANCER OF UPPER OUTER QUADRANT OF RIGHT FEMALE BREAST (C50.411) Impression: No clinical evidence of disease.  Plan port removal.  Discussed risks/benefits. She wishes to have this done this calendar year. mammogram due 10/23. Not on antiestrogen tx as her tumor was ER/PR negative. Current Plans You are being scheduled for surgery- Our schedulers will call you.  You should hear from our office's scheduling department within 5 working days about the location, date, and time of surgery. We try to make accommodations for patient's preferences in scheduling surgery, but sometimes the OR schedule or the surgeon's schedule prevents [place] from making those accommodations.  If you have not heard from our office [phone]) in 5 working days, call the office and ask for your surgeon's nurse.  If you have other questions about  your diagnosis, plan, or surgery, call the office and ask for your surgeon's nurse.

## 2020-08-20 ENCOUNTER — Inpatient Hospital Stay (HOSPITAL_COMMUNITY): Admission: RE | Admit: 2020-08-20 | Payer: Medicare Other | Source: Ambulatory Visit

## 2020-08-22 ENCOUNTER — Other Ambulatory Visit (HOSPITAL_COMMUNITY)
Admission: RE | Admit: 2020-08-22 | Discharge: 2020-08-22 | Disposition: A | Discharge status: Home / Self Care | Payer: Medicare Other | Source: Ambulatory Visit | Attending: General Surgery | Admitting: General Surgery

## 2020-08-22 ENCOUNTER — Encounter (HOSPITAL_BASED_OUTPATIENT_CLINIC_OR_DEPARTMENT_OTHER)
Admission: RE | Admit: 2020-08-22 | Discharge: 2020-08-22 | Disposition: A | Discharge status: Home / Self Care | Payer: Medicare Other | Source: Ambulatory Visit | Attending: General Surgery | Admitting: General Surgery

## 2020-08-22 DIAGNOSIS — Z01812 Encounter for preprocedural laboratory examination: Secondary | ICD-10-CM | POA: Insufficient documentation

## 2020-08-22 DIAGNOSIS — U071 COVID-19: Secondary | ICD-10-CM | POA: Diagnosis not present

## 2020-08-22 LAB — BASIC METABOLIC PANEL
Anion gap: 10 (ref 5–15)
BUN: 9 mg/dL (ref 8–23)
CO2: 24 mmol/L (ref 22–32)
Calcium: 9.4 mg/dL (ref 8.9–10.3)
Chloride: 103 mmol/L (ref 98–111)
Creatinine, Ser: 0.92 mg/dL (ref 0.44–1.00)
GFR, Estimated: >60 mL/min (ref >60)
Glucose, Bld: 157 mg/dL — ABNORMAL HIGH (ref 70–99)
Potassium: 4.1 mmol/L (ref 3.5–5.1)
Sodium: 137 mmol/L (ref 135–145)

## 2020-08-22 LAB — SARS CORONAVIRUS 2 (TAT 6-24 HRS): SARS Coronavirus 2: POSITIVE — AB

## 2020-08-22 NOTE — Progress Notes (Signed)

## 2020-08-23 ENCOUNTER — Telehealth: Payer: Self-pay | Admitting: Family

## 2020-08-23 NOTE — Progress Notes (Signed)
Notified Dr. Marlowe Aschoff office- pt positive for covid.

## 2020-08-23 NOTE — Telephone Encounter (Signed)
Called to discuss with patient about COVID-19 symptoms and the use of one of the available treatments for those with mild to moderate Covid symptoms and at a high risk of hospitalization.  Pt appears to qualify for outpatient treatment due to co-morbid conditions and/or a member of an at-risk group in accordance with the FDA Emergency Use Authorization.    Symptom onset: Unknown  Vaccinated: Yes  Booster? Not qualified for booster yet Qualifiers: Type 2 diabetes, breast cancer, hyperlipidemia, hypertension, high social risk score  I attempted to reach Ms. Detter and was unable to reach her. I left a voicemail with the hotline number for call back. No Mychart is available.   Terri Piedra, NP 08/23/2020 8:38 AM

## 2020-08-24 ENCOUNTER — Ambulatory Visit (HOSPITAL_BASED_OUTPATIENT_CLINIC_OR_DEPARTMENT_OTHER): Admission: RE | Admit: 2020-08-24 | Payer: Medicare Other | Source: Home / Self Care | Admitting: General Surgery

## 2020-08-24 SURGERY — REMOVAL PORT-A-CATH
Anesthesia: Monitor Anesthesia Care

## 2020-09-15 ENCOUNTER — Other Ambulatory Visit: Payer: Self-pay | Admitting: Family Medicine

## 2020-09-15 DIAGNOSIS — G8929 Other chronic pain: Secondary | ICD-10-CM

## 2020-09-28 ENCOUNTER — Other Ambulatory Visit: Payer: Self-pay | Admitting: Family Medicine

## 2020-09-28 DIAGNOSIS — E1122 Type 2 diabetes mellitus with diabetic chronic kidney disease: Secondary | ICD-10-CM

## 2020-10-24 ENCOUNTER — Encounter: Payer: Self-pay | Admitting: Family Medicine

## 2020-10-26 ENCOUNTER — Encounter (HOSPITAL_COMMUNITY): Payer: Self-pay | Admitting: General Surgery

## 2020-10-26 ENCOUNTER — Other Ambulatory Visit: Payer: Self-pay | Admitting: General Surgery

## 2020-10-26 NOTE — Progress Notes (Signed)
Patient denies shortness of breath, fever, cough or chest pain.  PCP - Gerlene Fee, DO Cardiologist - Dr Haroldine Laws Oncology - Dr Jana Hakim  Chest x-ray - 10/17/19 (1V) EKG - DOS 10/27/20 Stress Test - n/a ECHO - 06/06/20 Cardiac Cath - n/a  Fasting Blood Sugar - 110s-120s Checks Blood Sugar 2 times a day  Do not take Synjardy today or on day of surgery.   . If your blood sugar is less than 70 mg/dL, you will need to treat for low blood sugar:  o Treat a low blood sugar (less than 70 mg/dL) with  cup of clear juice (cranberry or apple), 4 glucose tablets, OR glucose gel. o Recheck blood sugar in 15 minutes after treatment (to make sure it is greater than 70 mg/dL). If your blood sugar is not greater than 70 mg/dL on recheck, call [phone] for further instructions.  ERAS: Clear liquids til 12 Noon on DOS.  Anesthesia review: Yes  STOP now taking any Aspirin (unless otherwise instructed by your surgeon), Aleve, Naproxen, Ibuprofen, Motrin, Advil, Goody's, BC's, all herbal medications, fish oil, and all vitamins.   Coronavirus Screening Positive Covid test on 08/22/20.  Patient does not have any covid symptoms.

## 2020-10-27 ENCOUNTER — Encounter (HOSPITAL_COMMUNITY)
Admission: RE | Disposition: A | Discharge status: Home / Self Care | Payer: Self-pay | Source: Home / Self Care | Attending: General Surgery

## 2020-10-27 ENCOUNTER — Ambulatory Visit (HOSPITAL_COMMUNITY): Payer: Medicare Other | Admitting: Emergency Medicine

## 2020-10-27 ENCOUNTER — Other Ambulatory Visit: Payer: Self-pay

## 2020-10-27 ENCOUNTER — Encounter (HOSPITAL_COMMUNITY): Payer: Self-pay | Admitting: General Surgery

## 2020-10-27 ENCOUNTER — Ambulatory Visit (HOSPITAL_COMMUNITY)
Admission: RE | Admit: 2020-10-27 | Discharge: 2020-10-27 | Disposition: A | Discharge status: Home / Self Care | Payer: Medicare Other | Attending: General Surgery | Admitting: General Surgery

## 2020-10-27 DIAGNOSIS — Z79899 Other long term (current) drug therapy: Secondary | ICD-10-CM | POA: Insufficient documentation

## 2020-10-27 DIAGNOSIS — Z9221 Personal history of antineoplastic chemotherapy: Secondary | ICD-10-CM | POA: Diagnosis not present

## 2020-10-27 DIAGNOSIS — Z87891 Personal history of nicotine dependence: Secondary | ICD-10-CM | POA: Insufficient documentation

## 2020-10-27 DIAGNOSIS — Z853 Personal history of malignant neoplasm of breast: Secondary | ICD-10-CM | POA: Diagnosis not present

## 2020-10-27 DIAGNOSIS — Z882 Allergy status to sulfonamides status: Secondary | ICD-10-CM | POA: Diagnosis not present

## 2020-10-27 DIAGNOSIS — Z452 Encounter for adjustment and management of vascular access device: Secondary | ICD-10-CM | POA: Insufficient documentation

## 2020-10-27 DIAGNOSIS — Z803 Family history of malignant neoplasm of breast: Secondary | ICD-10-CM | POA: Diagnosis not present

## 2020-10-27 HISTORY — PX: PORT-A-CATH REMOVAL: SHX5289

## 2020-10-27 LAB — BASIC METABOLIC PANEL
Anion gap: 10 (ref 5–15)
BUN: 14 mg/dL (ref 8–23)
CO2: 23 mmol/L (ref 22–32)
Calcium: 9.5 mg/dL (ref 8.9–10.3)
Chloride: 104 mmol/L (ref 98–111)
Creatinine, Ser: 0.71 mg/dL (ref 0.44–1.00)
GFR, Estimated: >60 mL/min (ref >60)
Glucose, Bld: 138 mg/dL — ABNORMAL HIGH (ref 70–99)
Potassium: 3.7 mmol/L (ref 3.5–5.1)
Sodium: 137 mmol/L (ref 135–145)

## 2020-10-27 LAB — GLUCOSE, CAPILLARY
Glucose-Capillary: 133 mg/dL — ABNORMAL HIGH (ref 70–99)
Glucose-Capillary: 106 mg/dL — ABNORMAL HIGH (ref 70–99)
Glucose-Capillary: 112 mg/dL — ABNORMAL HIGH (ref 70–99)

## 2020-10-27 LAB — CBC
HCT: 47.6 % — ABNORMAL HIGH (ref 36.0–46.0)
Hemoglobin: 14.7 g/dL (ref 12.0–15.0)
MCH: 26.5 pg (ref 26.0–34.0)
MCHC: 30.9 g/dL (ref 30.0–36.0)
MCV: 85.9 fL (ref 80.0–100.0)
Platelets: 260 10*3/uL (ref 150–400)
RBC: 5.54 MIL/uL — ABNORMAL HIGH (ref 3.87–5.11)
RDW: 14.5 % (ref 11.5–15.5)
WBC: 4.6 10*3/uL (ref 4.0–10.5)
nRBC: 0 % (ref 0.0–0.2)

## 2020-10-27 SURGERY — REMOVAL PORT-A-CATH
Anesthesia: Monitor Anesthesia Care | Site: Chest | Laterality: Right

## 2020-10-27 MED ORDER — LIDOCAINE HCL (PF) 1 % IJ SOLN
INTRAMUSCULAR | Status: AC
  Filled 2020-10-27: qty 30

## 2020-10-27 MED ORDER — PROPOFOL 10 MG/ML IV BOLUS
INTRAVENOUS | Status: AC
  Filled 2020-10-27: qty 20

## 2020-10-27 MED ORDER — PROPOFOL 500 MG/50ML IV EMUL
INTRAVENOUS | Status: DC | PRN
  Administered 2020-10-27: 75 ug/kg/min via INTRAVENOUS

## 2020-10-27 MED ORDER — CHLORHEXIDINE GLUCONATE 0.12 % MT SOLN
OROMUCOSAL | Status: AC
  Administered 2020-10-27: 15 mL via OROMUCOSAL
  Filled 2020-10-27: qty 15

## 2020-10-27 MED ORDER — OXYCODONE HCL 5 MG PO TABS: 5.0000 mg | ORAL_TABLET | Freq: Four times a day (QID) | ORAL | 0 refills | Status: DC | PRN

## 2020-10-27 MED ORDER — ORAL CARE MOUTH RINSE: 15.0000 mL | Freq: Once | OROMUCOSAL | Status: AC

## 2020-10-27 MED ORDER — CHLORHEXIDINE GLUCONATE 0.12 % MT SOLN: 15.0000 mL | Freq: Once | OROMUCOSAL | Status: AC

## 2020-10-27 MED ORDER — LACTATED RINGERS IV SOLN: INTRAVENOUS | Status: DC

## 2020-10-27 MED ORDER — ACETAMINOPHEN 500 MG PO TABS
1000.0000 mg | ORAL_TABLET | ORAL | Status: AC
  Administered 2020-10-27: 1000 mg via ORAL
  Filled 2020-10-27: qty 2

## 2020-10-27 MED ORDER — FENTANYL CITRATE (PF) 100 MCG/2ML IJ SOLN
INTRAMUSCULAR | Status: DC | PRN
  Administered 2020-10-27: 50 ug via INTRAVENOUS

## 2020-10-27 MED ORDER — LIDOCAINE HCL 1 % IJ SOLN: INTRAMUSCULAR | Status: DC | PRN

## 2020-10-27 MED ORDER — BUPIVACAINE HCL 0.25 % IJ SOLN
INTRAMUSCULAR | Status: DC | PRN
  Administered 2020-10-27: 8 mL

## 2020-10-27 MED ORDER — LACTATED RINGERS IV SOLN: INTRAVENOUS | Status: DC | PRN

## 2020-10-27 MED ORDER — ONDANSETRON HCL 4 MG/2ML IJ SOLN
INTRAMUSCULAR | Status: DC | PRN
  Administered 2020-10-27: 4 mg via INTRAVENOUS

## 2020-10-27 MED ORDER — CHLORHEXIDINE GLUCONATE CLOTH 2 % EX PADS: 6.0000 | MEDICATED_PAD | Freq: Once | CUTANEOUS | Status: DC

## 2020-10-27 MED ORDER — LIDOCAINE-EPINEPHRINE 1 %-1:100000 IJ SOLN
INTRAMUSCULAR | Status: DC | PRN
  Administered 2020-10-27: 8 mL

## 2020-10-27 MED ORDER — LIDOCAINE HCL (CARDIAC) PF 100 MG/5ML IV SOSY
PREFILLED_SYRINGE | INTRAVENOUS | Status: DC | PRN
  Administered 2020-10-27: 30 mg via INTRAVENOUS

## 2020-10-27 MED ORDER — CEFAZOLIN SODIUM-DEXTROSE 2-4 GM/100ML-% IV SOLN
2.0000 g | INTRAVENOUS | Status: AC
  Administered 2020-10-27: 2 g via INTRAVENOUS
  Filled 2020-10-27: qty 100

## 2020-10-27 MED ORDER — BUPIVACAINE HCL (PF) 0.25 % IJ SOLN
INTRAMUSCULAR | Status: AC
  Filled 2020-10-27: qty 30

## 2020-10-27 MED ORDER — LIDOCAINE-EPINEPHRINE 1 %-1:100000 IJ SOLN
INTRAMUSCULAR | Status: AC
  Filled 2020-10-27: qty 1

## 2020-10-27 MED ORDER — MIDAZOLAM HCL 2 MG/2ML IJ SOLN
INTRAMUSCULAR | Status: AC
  Filled 2020-10-27: qty 2

## 2020-10-27 MED ORDER — FENTANYL CITRATE (PF) 250 MCG/5ML IJ SOLN
INTRAMUSCULAR | Status: AC
  Filled 2020-10-27: qty 5

## 2020-10-27 MED ORDER — MIDAZOLAM HCL 5 MG/5ML IJ SOLN
INTRAMUSCULAR | Status: DC | PRN
  Administered 2020-10-27: 2 mg via INTRAVENOUS

## 2020-10-27 SURGICAL SUPPLY — 29 items
ADH SKN CLS APL DERMABOND .7 (GAUZE/BANDAGES/DRESSINGS) ×1
CHLORAPREP W/TINT 10.5 ML (MISCELLANEOUS) ×3 IMPLANT
COVER SURGICAL LIGHT HANDLE (MISCELLANEOUS) ×3 IMPLANT
COVER WAND RF STERILE (DRAPES) ×3 IMPLANT
DECANTER SPIKE VIAL GLASS SM (MISCELLANEOUS) ×6 IMPLANT
DERMABOND ADVANCED (GAUZE/BANDAGES/DRESSINGS) ×2
DERMABOND ADVANCED .7 DNX12 (GAUZE/BANDAGES/DRESSINGS) ×1 IMPLANT
DRAPE LAPAROTOMY 100X72 PEDS (DRAPES) ×3 IMPLANT
ELECT CAUTERY BLADE 6.4 (BLADE) ×3 IMPLANT
ELECT REM PT RETURN 9FT ADLT (ELECTROSURGICAL) ×3
ELECTRODE REM PT RTRN 9FT ADLT (ELECTROSURGICAL) ×1 IMPLANT
GAUZE 4X4 16PLY RFD (DISPOSABLE) ×3 IMPLANT
GLOVE BIO SURGEON STRL SZ 6 (GLOVE) ×3 IMPLANT
GLOVE SURG UNDER LTX SZ6.5 (GLOVE) ×3 IMPLANT
GOWN STRL REUS W/ TWL LRG LVL3 (GOWN DISPOSABLE) ×1 IMPLANT
GOWN STRL REUS W/TWL 2XL LVL3 (GOWN DISPOSABLE) ×3 IMPLANT
GOWN STRL REUS W/TWL LRG LVL3 (GOWN DISPOSABLE) ×3
KIT BASIN OR (CUSTOM PROCEDURE TRAY) ×3 IMPLANT
KIT TURNOVER KIT B (KITS) ×3 IMPLANT
NEEDLE HYPO 25GX1X1/2 BEV (NEEDLE) ×3 IMPLANT
NS IRRIG 1000ML POUR BTL (IV SOLUTION) ×3 IMPLANT
PACK GENERAL/GYN (CUSTOM PROCEDURE TRAY) ×3 IMPLANT
PAD ARMBOARD 7.5X6 YLW CONV (MISCELLANEOUS) ×6 IMPLANT
SUT MON AB 4-0 PC3 18 (SUTURE) ×3 IMPLANT
SUT VIC AB 3-0 SH 27 (SUTURE) ×3
SUT VIC AB 3-0 SH 27X BRD (SUTURE) ×1 IMPLANT
SYR CONTROL 10ML LL (SYRINGE) ×3 IMPLANT
TOWEL GREEN STERILE (TOWEL DISPOSABLE) ×3 IMPLANT
TOWEL GREEN STERILE FF (TOWEL DISPOSABLE) ×3 IMPLANT

## 2020-10-27 NOTE — H&P (Signed)
Megan Khan is an 65 y.o. female.   Chief Complaint: history of right breast cancer HPI:  Pt is s/p breast conservation for right breast cancer in 2020. She received chemotherapy as her tumor was hormone receptor negative and her 2 positive.  She desires port removal.    Past Medical History:  Diagnosis Date  . Anemia    during pregnancy  . Breast cancer ([place])    right breast 2020  . Cancer [facility]) 2020   breast cancer on right  . Diabetes mellitus    Type 2  . Family history of breast cancer   . Family history of throat cancer   . Glaucoma   . Glaucoma   . Hx of colonic polyps   . Hyperlipidemia   . Hypertension   . Legally blind    completely blind in left eye, low vision in right  . Personal history of chemotherapy 07/2019   stopped after 12 treatments  . Personal history of radiation therapy    stopped 12/2019    Past Surgical History:  Procedure Laterality Date  . BREAST EXCISIONAL BIOPSY     right breast 05/2019  . BREAST LUMPECTOMY    . BREAST LUMPECTOMY WITH RADIOACTIVE SEED AND SENTINEL LYMPH NODE BIOPSY Right 05/29/2019   Procedure: RIGHT BREAST LUMPECTOMY WITH RADIOACTIVE SEED AND RIGHT DEEP AXILLARY SENTINEL LYMPH NODE BIOPSY INJECT BLUE DYE;  Surgeon: Fanny Skates, MD;  Location: [place];  Service: General;  Laterality: Right;  . COLONOSCOPY  01/22/2012   Procedure: COLONOSCOPY;  Surgeon: [name], MD;  Location: [facility];  Service: Endoscopy;  Laterality: N/A;  . COLONOSCOPY    . COLONOSCOPY    . HERNIA REPAIR    . PORTACATH PLACEMENT N/A 05/29/2019   Procedure: INSERTION PORT-A-CATH WITH ULTRASOUND;  Surgeon: Fanny Skates, MD;  Location: [place];  Service: General;  Laterality: N/A;  . RE-EXCISION OF BREAST LUMPECTOMY Right 06/10/2019   Procedure: RE-EXCISION OF RIGHT BREAST LUMPECTOMY MARGINS;  Surgeon: Fanny Skates, MD;  Location: [place];  Service: General;  Laterality: Right;  . TUBAL LIGATION      Family History  Problem  Relation Age of Onset  . Breast cancer Sister 51       Her2 +  . Breast cancer Cousin        maternal second cousin, Bilateral mastectomy, diagnosed in her 37s   Social History:  reports that she quit smoking about 25 years ago. Her smoking use included cigarettes. She has never used smokeless tobacco. She reports that she does not drink alcohol and does not use drugs.  Allergies:  Allergies  Allergen Reactions  . Sulfamethoxazole Rash    REACTION: Questionable allergy ?    Medications Prior to Admission  Medication Sig Dispense Refill  . amLODipine (NORVASC) 10 MG tablet TAKE 1 TABLET(10 MG) BY MOUTH DAILY (Patient taking differently: Take 10 mg by mouth in the morning.) 30 tablet 2  . Ascorbic Acid (VITAMIN C) 1000 MG tablet Take 2,000 mg by mouth daily.    . bimatoprost (LUMIGAN) 0.01 % SOLN Place 2 drops into the left eye at bedtime.     . dorzolamide (TRUSOPT) 2 % ophthalmic solution Place 2 drops into the left eye 3 (three) times daily.     Marland Kitchen gabapentin (NEURONTIN) 100 MG capsule TAKE 2 CAPSULES(200 MG) BY MOUTH THREE TIMES DAILY (Patient taking differently: Take 200 mg by mouth 3 (three) times daily.) 180 capsule 0  . lisinopril (ZESTRIL) 40 MG tablet  Take 1 tablet (40 mg total) by mouth daily. (Patient taking differently: Take 40 mg by mouth in the morning.) 30 tablet 6  . lovastatin (MEVACOR) 20 MG tablet Take 1 tablet (20 mg total) by mouth at bedtime. 90 tablet 3  . metoprolol succinate (TOPROL-XL) 50 MG 24 hr tablet TAKE 1 TABLET(50 MG) BY MOUTH DAILY (Patient taking differently: Take 50 mg by mouth in the morning.) 90 tablet 1  . spironolactone (ALDACTONE) 25 MG tablet Take 0.5 tablets (12.5 mg total) by mouth daily. 45 tablet 3  . SYNJARDY 12.12-998 MG TABS TAKE 1 TABLET BY MOUTH TWICE DAILY (Patient taking differently: Take 1 tablet by mouth in the morning and at bedtime.) 180 tablet 0  . blood glucose meter kit and supplies KIT Dispense based on patient and insurance  preference. Use up to four times daily as directed. (FOR ICD-9 250.00, 250.01). 1 each 12  . Blood Glucose Monitoring Suppl (PRODIGY AUTOCODE BLOOD GLUCOSE) w/Device KIT 1 kit by Does not apply route in the morning, at noon, and at bedtime. Any brand acceptable if it works for patient 1 kit 0  . lidocaine-prilocaine (EMLA) cream Apply to affected area once (Patient taking differently: Apply 1 application topically daily as needed. Port: Apply to affected area once) 30 g 3    Results for orders placed or performed during the hospital encounter of 10/27/20 (from the past 48 hour(s))  Glucose, capillary     Status: Abnormal   Collection Time: 10/27/20 12:57 PM  Result Value Ref Range   Glucose-Capillary 133 (H) 70 - 99 mg/dL    Comment: Glucose reference range applies only to samples taken after fasting for at least 8 hours.   No results found.  Review of Systems  All other systems reviewed and are negative.   Blood pressure (!) 159/89, pulse 62, temperature 98.7 F (37.1 C), temperature source Oral, resp. rate 18, height 5' 1.5" (1.562 m), weight 72.6 kg, SpO2 99 %. Physical Exam Constitutional:      General: She is not in acute distress.    Appearance: Normal appearance. She is normal weight.  HENT:     Head: Normocephalic and atraumatic.  Eyes:     Conjunctiva/sclera: Conjunctivae normal.     Pupils: Pupils are equal, round, and reactive to light.  Cardiovascular:     Rate and Rhythm: Normal rate and regular rhythm.     Pulses: Normal pulses.  Pulmonary:     Effort: Pulmonary effort is normal.     Comments: Right port in place Abdominal:     General: Abdomen is flat.     Palpations: Abdomen is soft.  Musculoskeletal:        General: Normal range of motion.     Cervical back: Neck supple.  Skin:    General: Skin is warm and dry.     Capillary Refill: Capillary refill takes 2 to 3 seconds.  Neurological:     General: No focal deficit present.     Mental Status: She is  alert and oriented to person, place, and time.     Gait: Gait abnormal.  Psychiatric:        Mood and Affect: Mood normal.        Behavior: Behavior normal.        Thought Content: Thought content normal.        Judgment: Judgment normal.      Assessment/Plan History of right breast cancer Port in place  Plan port removal.  Reviewed risks Pt desires to proceed   Stark Klein, MD 10/27/2020, 2:04 PM

## 2020-10-27 NOTE — Anesthesia Postprocedure Evaluation (Signed)
Anesthesia Post Note  Patient: Megan Khan  Procedure(s) Performed: REMOVAL PORT-A-CATH (Right Chest)     Patient location during evaluation: PACU Anesthesia Type: MAC Level of consciousness: awake and alert Pain management: pain level controlled Vital Signs Assessment: post-procedure vital signs reviewed and stable Respiratory status: spontaneous breathing, nonlabored ventilation, respiratory function stable and patient connected to nasal cannula oxygen Cardiovascular status: stable and blood pressure returned to baseline Postop Assessment: no apparent nausea or vomiting Anesthetic complications: no   No complications documented.  Last Vitals:  Vitals:   10/27/20 1630 10/27/20 1645  BP: 123/78 137/81  Pulse: 64 (!) 59  Resp: 17 17  Temp:  36.7 C  SpO2: 98% 98%    Last Pain:  Vitals:   10/27/20 1645  TempSrc:   PainSc: 0-No pain                 Catalina Gravel

## 2020-10-27 NOTE — Op Note (Signed)
  PRE-OPERATIVE DIAGNOSIS:  un-needed Port-A-Cath for right breast cancer  POST-OPERATIVE DIAGNOSIS:  Same   PROCEDURE:  Procedure(s):  REMOVAL PORT-A-CATH  SURGEON:  Surgeon(s):  Stark Klein, MD  ANESTHESIA:   MAC + local  EBL:   Minimal  SPECIMEN:  None  Complications : none known  Procedure:   Pt was  identified in the holding area and taken to the operating room where she was placed supine on the operating room table.  MAC anesthesia was induced.  The right upper chest was prepped and draped.  The prior incision was anesthetized with local anesthetic.  The incision was opened with a #15 blade.  The subcutaneous tissue was divided with the cautery.  The port was identified and the capsule opened.  The four 2-0 prolene sutures were removed.  The port was then removed and pressure held on the tract.  The catheter appeared intact without evidence of breakage, catheter tip went to zero cm.  The wound was inspected for hemostasis, which was achieved with cautery.  The wound was closed with 3-0 vicryl deep dermal interrupted sutures and 4-0 Monocryl running subcuticular suture.  The wound was cleaned, dried, and dressed with dermabond.  The patient was awakened from anesthesia and taken to the PACU in stable condition.  Needle, sponge, and instrument counts are correct.

## 2020-10-27 NOTE — Transfer of Care (Signed)
Immediate Anesthesia Transfer of Care Note  Patient: Megan Khan  Procedure(s) Performed: REMOVAL PORT-A-CATH (Right Chest)  Patient Location: PACU  Anesthesia Type:MAC  Level of Consciousness: awake, alert  and oriented  Airway & Oxygen Therapy: Patient Spontanous Breathing  Post-op Assessment: Report given to RN and Post -op Vital signs reviewed and stable  Post vital signs: Reviewed and stable  Last Vitals:  Vitals Value Taken Time  BP 106/66   Temp    Pulse 76   Resp 16   SpO2 100     Last Pain:  Vitals:   10/27/20 1315  TempSrc:   PainSc: 0-No pain         Complications: No complications documented.

## 2020-10-27 NOTE — Anesthesia Preprocedure Evaluation (Addendum)
Anesthesia Evaluation  Patient identified by MRN, date of birth, ID band Patient awake    Reviewed: Allergy & Precautions, NPO status , Patient's Chart, lab work & pertinent test results, reviewed documented beta blocker date and time   History of Anesthesia Complications Negative for: history of anesthetic complications  Airway Mallampati: II  TM Distance: >3 FB Neck ROM: Full    Dental  (+) Dental Advisory Given, Partial Upper,    Pulmonary former smoker,    Pulmonary exam normal breath sounds clear to auscultation       Cardiovascular hypertension, Pt. on medications and Pt. on home beta blockers Normal cardiovascular exam Rhythm:Regular Rate:Normal  Echo 06/06/20: . Average GLS not reported . Left ventricular ejection fraction, by estimation, is 60 to 65%. The left ventricle has normal function. The left ventricle has no regional wall motion abnormalities. There is mild left ventricular hypertrophy. Left ventricular diastolic parameters are consistent with Grade I diastolic dysfunction (impaired relaxation).  2. Right ventricular systolic function is normal. The right ventricular size is normal.  3. Left atrial size was mildly dilated.  4. The mitral valve is normal in structure. Trivial mitral valve regurgitation. No evidence of mitral stenosis.  5. The aortic valve is normal in structure. Aortic valve regurgitation is not visualized. No aortic stenosis is present.  6. The inferior vena cava is normal in size with greater than 50% respiratory variability, suggesting right atrial pressure of 3 mmHg.    Neuro/Psych negative neurological ROS     GI/Hepatic negative GI ROS, Neg liver ROS,   Endo/Other  diabetes, Type 2, Oral Hypoglycemic Agents  Renal/GU negative Renal ROS     Musculoskeletal  (+) Arthritis ,   Abdominal   Peds  Hematology negative hematology ROS (+)   Anesthesia Other Findings Day of surgery  medications reviewed with the patient.  HISTORY OF BREAST CANCER  Reproductive/Obstetrics                            Anesthesia Physical Anesthesia Plan  ASA: III  Anesthesia Plan: MAC   Post-op Pain Management:    Induction: Intravenous  PONV Risk Score and Plan: 2 and Propofol infusion, Midazolam, Treatment may vary due to age or medical condition, Ondansetron and TIVA  Airway Management Planned: Nasal Cannula and Natural Airway  Additional Equipment:   Intra-op Plan:   Post-operative Plan:   Informed Consent: I have reviewed the patients History and Physical, chart, labs and discussed the procedure including the risks, benefits and alternatives for the proposed anesthesia with the patient or authorized representative who has indicated his/her understanding and acceptance.     Dental advisory given  Plan Discussed with: CRNA and Anesthesiologist  Anesthesia Plan Comments:         Anesthesia Quick Evaluation

## 2020-10-27 NOTE — Anesthesia Procedure Notes (Signed)
Procedure Name: MAC Date/Time: 10/27/2020 3:45 PM Performed by: Eligha Bridegroom, CRNA Pre-anesthesia Checklist: Emergency Drugs available, Suction available, Patient identified, Patient being monitored and Timeout performed Patient Re-evaluated:Patient Re-evaluated prior to induction Oxygen Delivery Method: Nasal cannula

## 2020-10-27 NOTE — Discharge Instructions (Addendum)
[place] Office Phone Number [phone]   POST OP INSTRUCTIONS  Always review your discharge instruction sheet given to you by the facility where your surgery was performed.  IF YOU HAVE DISABILITY OR FAMILY LEAVE FORMS, YOU MUST BRING THEM TO THE OFFICE FOR PROCESSING.  DO NOT GIVE THEM TO YOUR DOCTOR.  1. A prescription for pain medication may be given to you upon discharge.  Take your pain medication as prescribed, if needed.  If narcotic pain medicine is not needed, then you may take acetaminophen (Tylenol) or ibuprofen (Advil) as needed. 2. Take your usually prescribed medications unless otherwise directed 3. If you need a refill on your pain medication, please contact your pharmacy.  They will contact our office to request authorization.  Prescriptions will not be filled after 5pm or on week-ends. 4. You should eat very light the first 24 hours after surgery, such as soup, crackers, pudding, etc.  Resume your normal diet the day after surgery 5. It is common to experience some constipation if taking pain medication after surgery.  Increasing fluid intake and taking a stool softener will usually help or prevent this problem from occurring.  A mild laxative (Milk of Magnesia or Miralax) should be taken according to package directions if there are no bowel movements after 48 hours. 6. You may shower in 48 hours.  The surgical glue will flake off in 2-3 weeks.   7. ACTIVITIES:  No strenuous activity or heavy lifting for 1 week.   a. You may drive when you no longer are taking prescription pain medication, you can comfortably wear a seatbelt, and you can safely maneuver your car and apply brakes. b. RETURN TO WORK:  __________1 week_______________ Dennis Bast should see your doctor in the office for a follow-up appointment approximately three-four weeks after your surgery.    WHEN TO CALL YOUR DOCTOR: 1. Fever over 101.0 2. Nausea and/or vomiting. 3. Extreme swelling or  bruising. 4. Continued bleeding from incision. 5. Increased pain, redness, or drainage from the incision.  The clinic staff is available to answer your questions during regular business hours.  Please dont hesitate to call and ask to speak to one of the nurses for clinical concerns.  If you have a medical emergency, go to the nearest emergency room or call 911.  A surgeon from [facility] is always on call at the hospital.  For further questions, please visit [email or web address]

## 2020-10-28 ENCOUNTER — Encounter (HOSPITAL_COMMUNITY): Payer: Self-pay | Admitting: General Surgery

## 2020-11-02 ENCOUNTER — Other Ambulatory Visit: Payer: Self-pay | Admitting: Family Medicine

## 2020-11-02 DIAGNOSIS — G8929 Other chronic pain: Secondary | ICD-10-CM

## 2020-11-15 ENCOUNTER — Encounter: Payer: Self-pay | Admitting: Podiatry

## 2020-11-15 ENCOUNTER — Other Ambulatory Visit: Payer: Self-pay

## 2020-11-15 ENCOUNTER — Ambulatory Visit (INDEPENDENT_AMBULATORY_CARE_PROVIDER_SITE_OTHER): Payer: Medicare Other

## 2020-11-15 ENCOUNTER — Ambulatory Visit (INDEPENDENT_AMBULATORY_CARE_PROVIDER_SITE_OTHER): Payer: Medicare Other | Admitting: Podiatry

## 2020-11-15 DIAGNOSIS — M2041 Other hammer toe(s) (acquired), right foot: Secondary | ICD-10-CM

## 2020-11-15 DIAGNOSIS — M2042 Other hammer toe(s) (acquired), left foot: Secondary | ICD-10-CM

## 2020-11-15 DIAGNOSIS — M2011 Hallux valgus (acquired), right foot: Secondary | ICD-10-CM | POA: Diagnosis not present

## 2020-11-15 DIAGNOSIS — M21611 Bunion of right foot: Secondary | ICD-10-CM

## 2020-11-15 DIAGNOSIS — M2012 Hallux valgus (acquired), left foot: Secondary | ICD-10-CM

## 2020-11-15 DIAGNOSIS — M21612 Bunion of left foot: Secondary | ICD-10-CM

## 2020-11-15 NOTE — Progress Notes (Signed)
  Subjective:  Patient ID: Megan Khan, female    DOB: 10/13/1955,  MRN: [id number]  Chief Complaint  Patient presents with  . Hammer Toe    PT stated that she is having some pain on her right foot 2nd toe     65 y.o. female presents with the above complaint. History confirmed with patient.   Objective:  Physical Exam: warm, good capillary refill, no trophic changes or ulcerative lesions, normal DP and PT pulses, normal monofilament exam and normal sensory exam.  Bilaterally she has moderate to severe bunion deformities with hammertoe second toe  Radiographs: X-ray of both feet: Hammertoe deformities and bunion with hallux valgus, pes planus Assessment:   1. Hammer toes of both feet   2. Hallux valgus with bunions, left   3. Hallux valgus with bunions, right      Plan:  Patient was evaluated and treated and all questions answered.  This etiology and treatment options of bunions and hammertoes in detail patient.  Currently second toes are both most painful.  I do not think it be worthwhile to correct hammertoes without correcting the bunion deformity associated with it.  Her current A1c is elevated too high for surgery.  I recommend she work on her glycemic control, with her PCP to lower her A1c to below 8%, my goal would be 7.5% for surgery and she is able to do this and we should consider surgery.  Return in a few months to reevaluate this.  I dispensed silicone toe caps to offload and treat nonsurgically currently.  Return in about 3 months (around 02/14/2021) for re-check bunions, A1c, consider surgery for toes .

## 2020-11-29 ENCOUNTER — Telehealth: Payer: Self-pay

## 2020-11-29 NOTE — Telephone Encounter (Signed)
I connected by phone with Elijah Birk and/or patient's caregiver on 11/29/2020 at 9:23 AM to discuss the potential vaccination through our Homebound vaccination initiative.   Prevaccination Checklist for COVID-19 Vaccines  1.  Are you feeling sick today? no  2.  Have you ever received a dose of a COVID-19 vaccine?  yes      If yes, which one? Pfizer   How many dose of Covid-19 vaccine have your received and dates ? 2, 02/13/2020, 04/05/2020   Check all that apply: I live in a long-term care setting. no  I have been diagnosed with a medical condition(s). Please list: Breast cancer (pertinent to homebound status)  I am a first responder. no  I work in a long-term care facility, correctional facility, hospital, restaurant, retail setting, school, or other setting with high exposure to the public. no  4. Do you have a health condition or are you undergoing treatment that makes you moderately or severely immunocompromised? (This would include treatment for cancer or HIV, receipt of organ transplant, immunosuppressive therapy or high-dose corticosteroids, CAR-T-cell therapy, hematopoietic cell transplant [HCT], DiGeorge syndrome or Wiskott-Aldrich syndrome)  no  5. Have you received hematopoietic cell transplant (HCT) or CAR-T-cell therapies since receiving COVID-19 vaccine? no  6.  Have you ever had an allergic reaction: (This would include a severe reaction [ e.g., anaphylaxis] that required treatment with epinephrine or EpiPen or that caused you to go to the hospital.  It would also include an allergic reaction that occurred within 4 hours that caused hives, swelling, or respiratory distress, including wheezing.) A.  A previous dose of COVID-19 vaccine. no  B.  A vaccine or injectable therapy that contains multiple components, one of which is a COVID-19 vaccine component, but it is not known which component elicited the immediate reaction. no  C.  Are you allergic to polyethylene glycol? no   D. Are you allergic to Polysorbate, which is found in some vaccines, film coated tablets and intravenous steroids?  no   7.  Have you ever had an allergic reaction to another vaccine (other than COVID-19 vaccine) or an injectable medication? (This would include a severe reaction [ e.g., anaphylaxis] that required treatment with epinephrine or EpiPen or that caused you to go to the hospital.  It would also include an allergic reaction that occurred within 4 hours that caused hives, swelling, or respiratory distress, including wheezing.)  no   8.  Have you ever had a severe allergic reaction (e.g., anaphylaxis) to something other than a component of the COVID-19 vaccine, or any vaccine or injectable medication?  This would include food, pet, venom, environmental, or oral medication allergies.  no   Check all that apply to you:  Am a female between ages 69 and 79 years old  no  Women 15 through 64 years of age can receive any FDA-authorized or -approved COVID-19 vaccine. However, they should be informed of the rare but increased risk of thrombosis with thrombocytopenia syndrome (TTS) after receipt of the [employer] Vaccine and the availability of other FDA-authorized and -approved COVID-19 vaccines. People who had TTS after a first dose of Janssen vaccine should not receive a subsequent dose of Janssen product    Am a female between ages 65 and 35 years old  no Males 5 through 64 years of age may receive the correct formulation of Pfizer-BioNTech COVID-19 vaccine. Males 18 and older can receive any FDA-authorized or -approved vaccine. However, people receiving an mRNA COVID-19 vaccine, especially  males 95 through 64 years of age and their parents/legal representative (when relevant), should be informed of the risk of developing myocarditis (an inflammation of the heart muscle) or pericarditis (inflammation of the lining around the heart) after receipt of an mRNA vaccine. The risk of developing either  myocarditis or pericarditis after vaccination is low, and lower than the risk of myocarditis associated with SARS-CoV-2 infection in adolescents and adults. Vaccine recipients should be counseled about the need to seek care if symptoms of myocarditis or pericarditis develop after vaccination     Have a history of myocarditis or pericarditis  no Myocarditis or pericarditis after receipt of the first dose of an mRNA COVID-19 vaccine series but before administration of the second dose  Experts advise that people who develop myocarditis or pericarditis after a dose of an mRNA COVID-19 vaccine not receive a subsequent dose of any COVID-19 vaccine, until additional safety data are available.  Administration of a subsequent dose of COVID-19 vaccine before safety data are available can be considered in certain circumstances after the episode of myocarditis or pericarditis has completely resolved. Until additional data are available, some experts recommend a Alphonsa Overall COVID-19 vaccine be considered instead of an mRNA COVID-19 vaccine. Decisions about proceeding with a subsequent dose should include a conversation between the patient, their parent/legal representative (when relevant), and their clinical team, which may include a cardiologist.    Have been treated with monoclonal antibodies or convalescent serum to prevent or treat COVID-19  no Vaccination should be offered to people regardless of history of prior symptomatic or asymptomatic SARS-CoV-2 infection. There is no recommended minimal interval between infection and vaccination.  However, vaccination should be deferred if a patient received monoclonal antibodies or convalescent serum as treatment for COVID-19 or for post-exposure prophylaxis. This is a precautionary measure until additional information becomes available, to avoid interference of the antibody treatment with vaccine-induced immune responses.  Defer COVID-19 vaccination for 30 days when a  passive antibody product was used for post-exposure prophylaxis.  Defer COVID-19 vaccination for 90 days when a passive antibody product was used to treat COVID-19.     Diagnosed with Multisystem Inflammatory Syndrome (MIS-C or MIS-A) after a COVID-19 infection  no It is unknown if people with a history of MIS-C or MIS-A are at risk for a dysregulated immune response to COVID-19 vaccination.  People with a history of MIS-C or MIS-A may choose to be vaccinated. Considerations for vaccination may include:   Clinical recovery from MIS-C or MIS-A, including return to normal cardiac function   Personal risk of severe acute COVID-19 (e.g., age, underlying conditions)   High or substantial community transmission of SARS-CoV-2 and personal increased risk of reinfection.   Timing of any immunomodulatory therapies (general best practice guidelines for immunization can be consulted for more information [email or web address])   It has been 90 days or more since their diagnosis of MIS-C   Onset of MIS-C occurred before any COVID-19 vaccination   A conversation between the patient, their guardian(s), and their clinical team or a specialist may assist with COVID-19 vaccination decisions. Healthcare providers and health departments may also request a consultation from the [place] at [email or web address].     Have a bleeding disorder  no Take a blood thinner  no As with all vaccines, any COVID-19 vaccine product may be given to these patients, if a physician familiar with the patient's bleeding risk determines that the vaccine can be administered intramuscularly with reasonable safety.  ACIP  recommends the following technique for intramuscular vaccination in patients with bleeding disorders or taking blood thinners: a fine-gauge needle (23-gauge or smaller caliber) should be used  for the vaccination, followed by firm pressure on the site, without rubbing, for at least 2 minutes.  People who regularly take aspirin or anticoagulants as part of their routine medications do not need to stop these medications prior to receipt of any COVID-19 vaccine.    Have a history of heparin-induced thrombocytopenia (HIT)  no Although the etiology of TTS associated with the Alphonsa Overall COVID-19 vaccine is unclear, it appears to be similar to another rare immune-mediated syndrome, heparin-induced thrombocytopenia (HIT). People with a history of an episode of an immune-mediated syndrome characterized by thrombosis and thrombocytopenia, such as HIT, should be offered a currently FDA-approved or FDA-authorized mRNA COVID-19 vaccine if it has been ?90 days since their TTS resolved. After 90 days, patients may be vaccinated with any currently FDA-approved or FDA-authorized COVID-19 vaccine, including Janssen COVID-19 Vaccine. However, people who developed TTS after their initial Alphonsa Overall vaccine should not receive a Janssen booster dose.  Experts believe the following factors do not make people more susceptible to TTS after receipt of the [employer]. People with these conditions can be vaccinated with any FDA-authorized or - approved COVID-19 vaccine, including the [employer] COVID-19 Vaccine:   A prior history of venous thromboembolism   Risk factors for venous thromboembolism (e.g., inherited or acquired thrombophilia including Factor V Leiden; prothrombin gene 20210A mutation; antiphospholipid syndrome; protein C, protein S or antithrombin deficiency   A prior history of other types of thromboses not associated with thrombocytopenia   Pregnancy, post-partum status, or receipt of hormonal contraceptives (e.g., combined oral contraceptives, patch, ring)   Additional recipient education materials can be found at http://gutierrez-robinson.com/ vaccines/safety/JJUpdate.html.    Am currently  pregnant or breastfeeding  no Vaccination is recommended for all people aged 63 years and older, including people that are:   Pregnant   Breastfeeding   Trying to get pregnant now or who might become pregnant in the future   Pregnant, breastfeeding, and post-partum people 66 through 64 years of age should be aware of the rare risk of TTS after receipt of the Alphonsa Overall COVID-19 Vaccine and the availability of other FDA-authorized or -approved COVID-19 vaccines (i.e., mRNA vaccines).    Have received dermal fillers  no FDA-authorized or -approved COVID-19 vaccines can be administered to people who have received injectable dermal fillers who have no contraindications for vaccination.  Infrequently, these people might experience temporary swelling at or near the site of filler injection (usually the face or lips) following administration of a dose of an mRNA COVID-19 vaccine. These people should be advised to contact their healthcare provider if swelling develops at or near the site of dermal filler following vaccination.     Have a history of Guillain-Barr Syndrome (GBS)  no People with a history of GBS can receive any FDA-authorized or -approved COVID-19 vaccine. However, given the possible association between the [employer] and an increased risk of GBS, a patient with a history of GBS and their clinical team should discuss the availability of mRNA vaccines to offer protection against COVID-19. The highest risk has been observed in men aged 50-64 years with symptoms of GBS beginning within 42 days after Alphonsa Overall COVID-19 vaccination.  People who had GBS after receiving Janssen vaccine should be made aware of the option to receive an mRNA COVID-19 vaccine booster at least 2 months (8 weeks) after the  Janssen dose. However, Alphonsa Overall vaccine may be used as a booster, particularly if GBS occurred more than 42 days after vaccination or was related to a non-vaccine factor. Prior to booster  vaccination, a conversation between the patient and their clinical team may assist with decisions about use of a COVID-19 booster dose, including the timing of administration     Postvaccination Observation Times for People without Contraindications to Covid 19 Vaccination.  30 minutes:  People with a history of: A contraindication to another type of COVID-19 vaccine product (i.e., mRNA or viral vector COVID-19 vaccines)   Immediate (within 4 hours of exposure) non-severe allergic reaction to a COVID-19 vaccine or injectable therapies   Anaphylaxis due to any cause   Immediate allergic reaction of any severity to a non-COVID-19 vaccine   15 minutes: All other people  This patient is a 65 y.o. female that meets the FDA criteria to receive homebound vaccination. Patient or parent/caregiver understands they have the option to accept or refuse homebound vaccination.  Patient passed the pre-screening checklist and would like to proceed with homebound vaccination.  Based on questionnaire above, I recommend the patient be observed for 15 minutes.  There are an estimated #0 other household members/caregivers who are also interested in receiving the vaccine.    The patient has been confirmed homebound and eligible for homebound vaccination with the considerations outlined above. I will send the patient's information to our scheduling team who will reach out to schedule the patient and potential caregiver/family members for homebound vaccination.    Dan Humphreys 11/29/2020 9:23 AM

## 2020-12-02 ENCOUNTER — Telehealth: Payer: Self-pay

## 2020-12-02 ENCOUNTER — Other Ambulatory Visit (HOSPITAL_COMMUNITY): Payer: Self-pay

## 2020-12-02 MED ORDER — LISINOPRIL 40 MG PO TABS: 40.0000 mg | ORAL_TABLET | Freq: Every day | ORAL | 0 refills | Status: DC

## 2020-12-02 MED ORDER — SPIRONOLACTONE 25 MG PO TABS: 12.5000 mg | ORAL_TABLET | Freq: Every day | ORAL | 0 refills | Status: DC

## 2020-12-02 NOTE — Telephone Encounter (Signed)
Patient calls nurse line requesting refills on BP medication. These were previously prescribed by Cardiologist, however, patient reports that she is no longer under their care.  Scheduled patient for follow up visit with PCP 6/7.  Please advise.   Talbot Grumbling, RN

## 2020-12-02 NOTE — Telephone Encounter (Signed)
This is a CHF pt 

## 2020-12-04 ENCOUNTER — Other Ambulatory Visit: Payer: Self-pay | Admitting: Family Medicine

## 2020-12-06 ENCOUNTER — Ambulatory Visit: Payer: Medicare Other | Attending: Critical Care Medicine

## 2020-12-06 DIAGNOSIS — Z23 Encounter for immunization: Secondary | ICD-10-CM

## 2020-12-06 NOTE — Progress Notes (Unsigned)
   Covid-19 Vaccination Clinic  Name:  Shloka Baldridge    MRN: [id number] DOB: 25-Jun-1956  12/06/2020  Ms. Vondra was observed post Covid-19 immunization for 15 minutes without incident. She was provided with Vaccine Information Sheet and instruction to access the V-Safe system.   Ms. Naef was instructed to call 911 with any severe reactions post vaccine: Marland Kitchen Difficulty breathing  . Swelling of face and throat  . A fast heartbeat  . A bad rash all over body  . Dizziness and weakness    *** Covid vaccine administration is NOT RECORDED.  Must document administration and refresh note before signing ***

## 2020-12-11 ENCOUNTER — Other Ambulatory Visit: Payer: Self-pay | Admitting: Family Medicine

## 2020-12-11 DIAGNOSIS — E1122 Type 2 diabetes mellitus with diabetic chronic kidney disease: Secondary | ICD-10-CM

## 2020-12-11 DIAGNOSIS — N181 Chronic kidney disease, stage 1: Secondary | ICD-10-CM

## 2020-12-13 ENCOUNTER — Other Ambulatory Visit: Payer: Self-pay

## 2020-12-13 DIAGNOSIS — E1169 Type 2 diabetes mellitus with other specified complication: Secondary | ICD-10-CM

## 2020-12-13 DIAGNOSIS — E785 Hyperlipidemia, unspecified: Secondary | ICD-10-CM

## 2020-12-13 MED ORDER — LOVASTATIN 20 MG PO TABS: 20.0000 mg | ORAL_TABLET | Freq: Every day | ORAL | 3 refills | Status: DC

## 2021-01-10 ENCOUNTER — Other Ambulatory Visit: Payer: Self-pay | Admitting: Family Medicine

## 2021-01-10 DIAGNOSIS — G8929 Other chronic pain: Secondary | ICD-10-CM

## 2021-01-17 ENCOUNTER — Ambulatory Visit (INDEPENDENT_AMBULATORY_CARE_PROVIDER_SITE_OTHER): Payer: Medicare Other | Admitting: Family Medicine

## 2021-01-17 ENCOUNTER — Other Ambulatory Visit: Payer: Self-pay

## 2021-01-17 ENCOUNTER — Encounter: Payer: Self-pay | Admitting: Family Medicine

## 2021-01-17 ENCOUNTER — Telehealth: Payer: Self-pay | Admitting: Family Medicine

## 2021-01-17 VITALS — BP 124/82 | HR 58 | Wt 156.8 lb

## 2021-01-17 DIAGNOSIS — I10 Essential (primary) hypertension: Secondary | ICD-10-CM

## 2021-01-17 DIAGNOSIS — E1165 Type 2 diabetes mellitus with hyperglycemia: Secondary | ICD-10-CM

## 2021-01-17 DIAGNOSIS — Z23 Encounter for immunization: Secondary | ICD-10-CM

## 2021-01-17 DIAGNOSIS — Z8639 Personal history of other endocrine, nutritional and metabolic disease: Secondary | ICD-10-CM

## 2021-01-17 LAB — POCT GLYCOSYLATED HEMOGLOBIN (HGB A1C): HbA1c, POC (controlled diabetic range): 8.7 % — AB (ref 0.0–7.0)

## 2021-01-17 MED ORDER — GLIPIZIDE ER 5 MG PO TB24: 5.0000 mg | ORAL_TABLET | Freq: Every day | ORAL | 3 refills | Status: DC

## 2021-01-17 MED ORDER — LISINOPRIL 40 MG PO TABS: 40.0000 mg | ORAL_TABLET | Freq: Every day | ORAL | 0 refills | Status: DC

## 2021-01-17 MED ORDER — SHINGRIX 50 MCG/0.5ML IM SUSR: 0.5000 mL | Freq: Once | INTRAMUSCULAR | 0 refills | Status: AC

## 2021-01-17 MED ORDER — SPIRONOLACTONE 25 MG PO TABS: 12.5000 mg | ORAL_TABLET | Freq: Every day | ORAL | 0 refills | Status: DC

## 2021-01-17 MED ORDER — SHINGRIX 50 MCG/0.5ML IM SUSR: 0.5000 mL | Freq: Once | INTRAMUSCULAR | 0 refills | Status: DC

## 2021-01-17 NOTE — Telephone Encounter (Signed)
Called patient. No answer. Left voicemail stating patient will need to have glucose tablets available with starting new medication in case of hypoglycemia. Informed patient to call if she will need further clarification.   Branson West, DO 01/17/2021, 3:05 PM PGY-2, [place]

## 2021-01-17 NOTE — Progress Notes (Addendum)
    SUBJECTIVE:   CHIEF COMPLAINT / HPI:   Ms. Gibby is a 65 yo F who presents for the below:  Diabetes Current Regimen: Synjardy 12.12-998 mg tab twice daily CBGs: 113-126 AM, 155 PM Last A1c: 8.7 on 07/18/2020 Denies polyuria, polydipsia, hypoglycemia Last Eye Exam: 06/2021 Statin: Lovastatin 20 mg nightly ACE/ARB: Lisinopril 40 mg daily  Hypertension: - Medications: Amlodipine 10 mg daily lisinopril 40 mg daily, metoprolol 50 mg daily, spironolactone 25 mg daily - Compliance: Yes - Checking BP at home: No - Denies any SOB, CP, vision changes, LE edema, medication SEs, or symptoms of hypotension  PERTINENT  PMH / PSH: Breast cancer right breast, HLD, cataracts  OBJECTIVE:   BP 124/82   Pulse (!) 58   Wt 156 lb 12.8 oz (71.1 kg)   SpO2 96%   BMI 29.15 kg/m   General: Appears well, no acute distress. Age appropriate. Cardiac: RRR, normal heart sounds, no murmurs Respiratory: CTAB, normal effort MSK: Left chest wall below right breast is without any skin changes.  Hypertonic musculature tender to palpation although rib contour feels the same bilaterally.  Results for orders placed or performed in visit on 01/17/21 (from the past 24 hour(s))  HgB A1c     Status: Abnormal   Collection Time: 01/17/21  1:30 PM  Result Value Ref Range   Hemoglobin A1C     HbA1c POC (<> result, manual entry)     HbA1c, POC (prediabetic range)     HbA1c, POC (controlled diabetic range) 8.7 (A) 0.0 - 7.0 %   ASSESSMENT/PLAN:   Type 2 diabetes mellitus with hyperglycemia (HCC) Unchanged A1c in 6 months. Planning for foot surgery with goal of A1c 7 or below. On max dose of synjardy. Prefers not to have injectable medication. Hesitant to try other medication. Hx of taking glipizide in the past. -Continue syndardy -Start glipizide 5 mg daily (will need to have sugar tabs on hand for possible hypoglycemia). Parameters given to call if this happens - Basic Metabolic Panel - Lipid Panel -F/u  in 3 months for repeat A1c  HYPERTENSION, BENIGN SYSTEMIC Well controlled. Continue current medications.   Hx of hypokalemia - spironolactone (ALDACTONE) 25 MG tablet; Take 0.5 tablets (12.5 mg total) by mouth daily.  Dispense: 45 tablet; Refill: 0 - Basic Metabolic Panel  Need for shingles vaccine - Zoster Vaccine Adjuvanted [facility]) injection; Inject 0.5 mLs into the muscle once for 1 dose.  Dispense: 0.5 mL; Refill: 0  Gerlene Fee, DO [place]

## 2021-01-17 NOTE — Patient Instructions (Addendum)
It was wonderful to see you today.  Please bring ALL of your medications with you to every visit.   Today we talked about:  Adding glipizide 5 mg daily to your diabetes regimen.  Call if blood sugar is less than 100.  If you develop a rash with the start of this medication please let us know and stop the medication.  We will follow-up in 3 months.  Please take Shingrix prescription to pharmacy and get vaccine.  For the chest wall pain add heat and ice and massage.  Follow-up if this does not get any better or worsens.  Continue to follow-up with Dr. Venetia Maxon  I will contact you about lab work.  Please be sure to schedule follow up at the front  desk before you leave today.   Dr. Janus Molder

## 2021-01-18 ENCOUNTER — Encounter: Payer: Self-pay | Admitting: Oncology

## 2021-01-18 LAB — BASIC METABOLIC PANEL
BUN/Creatinine Ratio: 12 (ref 12–28)
BUN: 9 mg/dL (ref 8–27)
CO2: 24 mmol/L (ref 20–29)
Calcium: 9.9 mg/dL (ref 8.7–10.3)
Chloride: 100 mmol/L (ref 96–106)
Creatinine, Ser: 0.77 mg/dL (ref 0.57–1.00)
Glucose: 112 mg/dL — ABNORMAL HIGH (ref 65–99)
Potassium: 4.2 mmol/L (ref 3.5–5.2)
Sodium: 139 mmol/L (ref 134–144)
eGFR: 86 mL/min/{1.73_m2} (ref >59)

## 2021-01-18 LAB — LIPID PANEL
Chol/HDL Ratio: 3.2 ratio (ref 0.0–4.4)
Cholesterol, Total: 187 mg/dL (ref 100–199)
HDL: 58 mg/dL (ref >39)
LDL Chol Calc (NIH): 108 mg/dL — ABNORMAL HIGH (ref 0–99)
Triglycerides: 116 mg/dL (ref 0–149)
VLDL Cholesterol Cal: 21 mg/dL (ref 5–40)

## 2021-01-18 NOTE — Assessment & Plan Note (Addendum)
Unchanged A1c in 6 months. Planning for foot surgery with goal of A1c 7 or below. On max dose of synjardy. Prefers not to have injectable medication. Hesitant to try other medication. Hx of taking glipizide in the past. -Continue syndardy -Start glipizide 5 mg daily (will need to have sugar tabs on hand for possible hypoglycemia). Parameters given to call if this happens - Basic Metabolic Panel - Lipid Panel -F/u in 3 months for repeat A1c

## 2021-01-18 NOTE — Assessment & Plan Note (Signed)
Well controlled. Continue current medications  

## 2021-01-24 ENCOUNTER — Telehealth: Payer: Self-pay | Admitting: Family Medicine

## 2021-01-24 NOTE — Telephone Encounter (Signed)
Attempted to call patient about results. No answer, unable to LVM.   Delcenia Inman Autry-Lott, DO 01/24/2021, 3:41 PM PGY-2, [place]

## 2021-01-26 ENCOUNTER — Telehealth: Payer: Self-pay | Admitting: Family Medicine

## 2021-01-26 NOTE — Telephone Encounter (Signed)
Reviewed, completed, and signed form.  Note routed to RN team inbasket and placed completed form in Clinic RN's office (wall pocket above desk).  Dash Cardarelli M Erin Uecker, MD   

## 2021-01-26 NOTE — Telephone Encounter (Signed)
Patient calling regarding her diabetic supplies she gets from [place] - they are saying Autry-Lotts name cannot be on it for insurance purposes since she is a resident. If we can get Dr Erin Hearing or Dr Owens Shark to sign this so patient can get her supplies. There are 2 of these forms in PCP's box to be completed. If you have any questions, please call [place] or the patient. Please advise.

## 2021-01-27 NOTE — Telephone Encounter (Signed)
Faxed to [facility]. Copy made and placed in batch scanning.   Talbot Grumbling, RN

## 2021-02-14 ENCOUNTER — Ambulatory Visit: Payer: Medicare Other | Admitting: Podiatry

## 2021-03-11 ENCOUNTER — Other Ambulatory Visit: Payer: Self-pay | Admitting: Family Medicine

## 2021-03-11 DIAGNOSIS — E1122 Type 2 diabetes mellitus with diabetic chronic kidney disease: Secondary | ICD-10-CM

## 2021-03-11 DIAGNOSIS — N181 Chronic kidney disease, stage 1: Secondary | ICD-10-CM

## 2021-03-15 ENCOUNTER — Encounter: Payer: Self-pay | Admitting: Family Medicine

## 2021-03-15 ENCOUNTER — Ambulatory Visit (INDEPENDENT_AMBULATORY_CARE_PROVIDER_SITE_OTHER): Payer: Medicare Other | Admitting: Family Medicine

## 2021-03-15 ENCOUNTER — Other Ambulatory Visit: Payer: Self-pay

## 2021-03-15 VITALS — BP 110/70 | HR 69 | Ht 61.5 in | Wt 154.4 lb

## 2021-03-15 DIAGNOSIS — E1165 Type 2 diabetes mellitus with hyperglycemia: Secondary | ICD-10-CM | POA: Diagnosis not present

## 2021-03-15 NOTE — Patient Instructions (Addendum)
Continue current medication for diabetes. Keep sugar tabs on hand. We will follow up in 1 month and recheck A1c.   Dr. Janus Molder

## 2021-03-15 NOTE — Progress Notes (Signed)
    SUBJECTIVE:   CHIEF COMPLAINT / HPI:   Megan Khan is a 65 yo F who presents for the following  Diabetes Current Regimen: Synjardy 12.12-998 mg tab twice daily, glipizide '5mg'$  qhs  CBGs: 97-100 AM, 190-260 qhs (30-40 mins after eating) Last A1c: 8.7 on 01/18/2020 Denies polyuria, polydipsia, hypoglycemia Last Eye Exam: 06/2021 Statin: Lovastatin 20 mg nightly ACE/ARB: Lisinopril 40 mg daily  Of note, recent laser eye surgery. Was put to sleep and had a bad reaction afterwards. Was fine when she went home. But woke up the next day and had slurred speech. EMS called and determined to be due to nerve block. Could not talk. Feels back to normal today.   PERTINENT  PMH / PSH: Hx of cataracts, open angle glaucoma  OBJECTIVE:   BP 110/70   Pulse 69   Ht 5' 1.5" (1.562 m)   Wt 154 lb 6.4 oz (70 kg)   SpO2 95%   BMI 28.70 kg/m   General: Appears well, no acute distress. Age appropriate. Cardiac: RRR, normal heart sounds, no murmurs Respiratory: CTAB, normal effort Extremities: No edema or cyanosis.  ASSESSMENT/PLAN:   Type 2 diabetes mellitus with hyperglycemia ([place]) Too early for repeat A1c. Most recently 8.7 01/2021. Assessed CBGs for appropriateness following glipizide initiation. Today appropriate and no hx of hypoglycemia since initiation. Patient does have sugar tabs at home in case of hypoglycemic episode. Will continue current regimen and follow up in 1 month for A1c. Of note, goal is 7 or < for patient's podiatry surgery.   Gerlene Fee, [place]

## 2021-03-18 IMAGING — US US BREAST BX W LOC DEV 1ST LESION IMG BX SPEC US GUIDE*R*
1 series · 12 of 14 positions shown · non-contrast
Comparison: Previous exam(s).
COMPARISON: Previous exam(s).

Addendum:
CLINICAL DATA: Ultrasound-guided core needle biopsy was recommended
of a mass in the 11 o'clock position of the right breast.

EXAM:
ULTRASOUND GUIDED RIGHT BREAST CORE NEEDLE BIOPSY

[Series 1: us breast bx w loc dev 1st lesion img bx spec us g · 0.06mm/px · 12 of 14 slices shown]
[im 1/14]
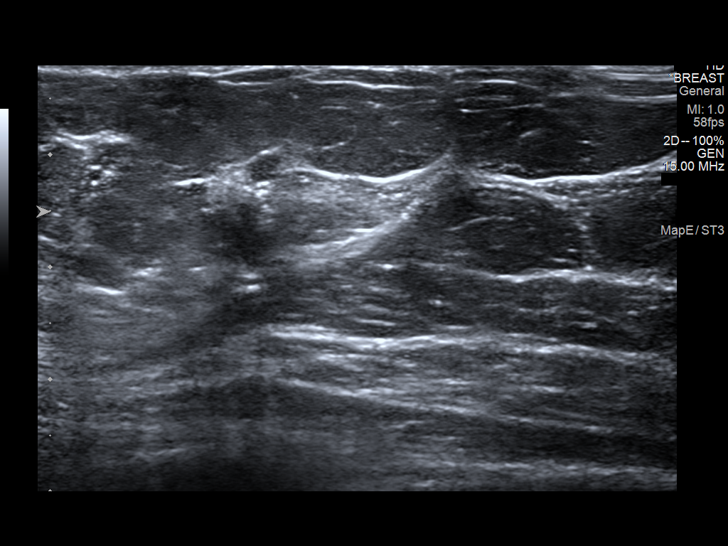
[im 2/14]
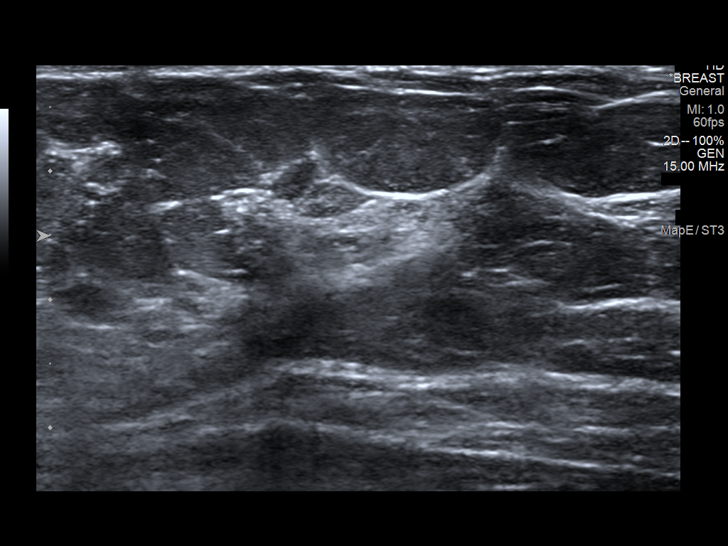
[im 3/14]
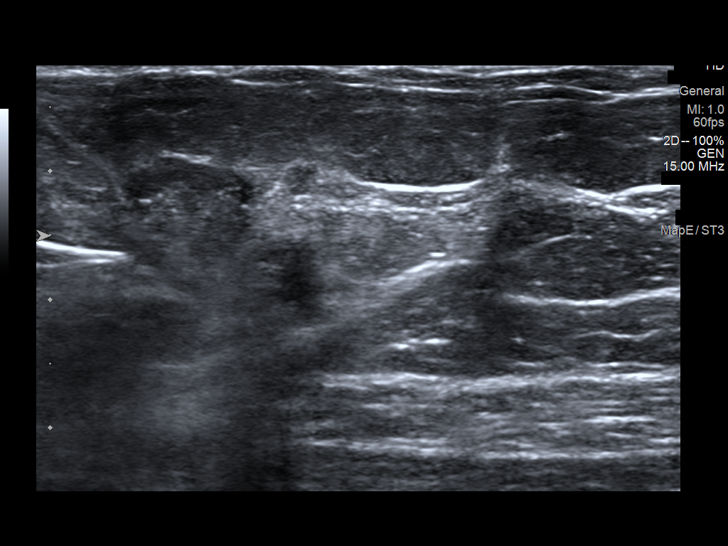
[im 5/14]
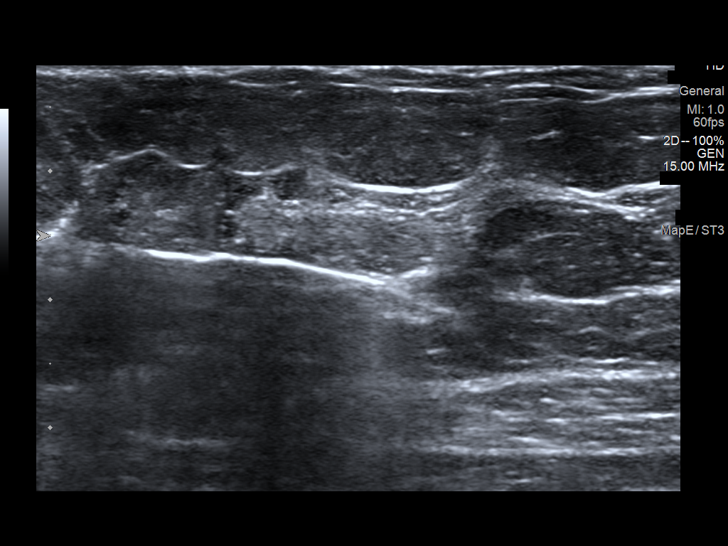
[im 6/14]
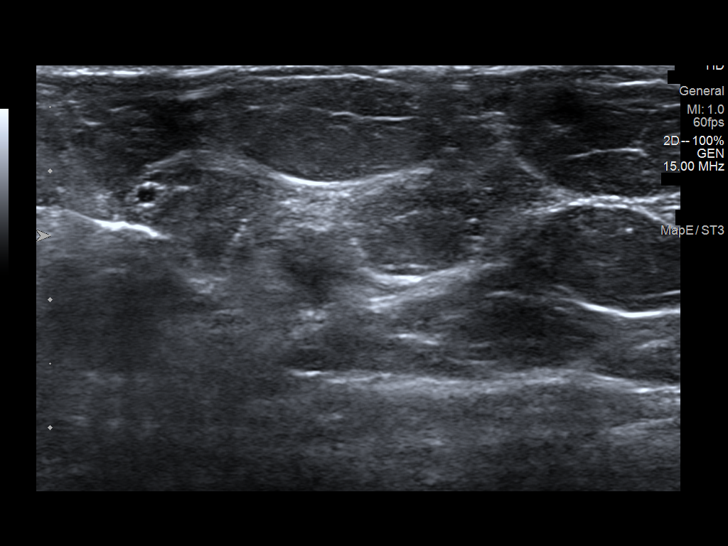
[im 7/14]
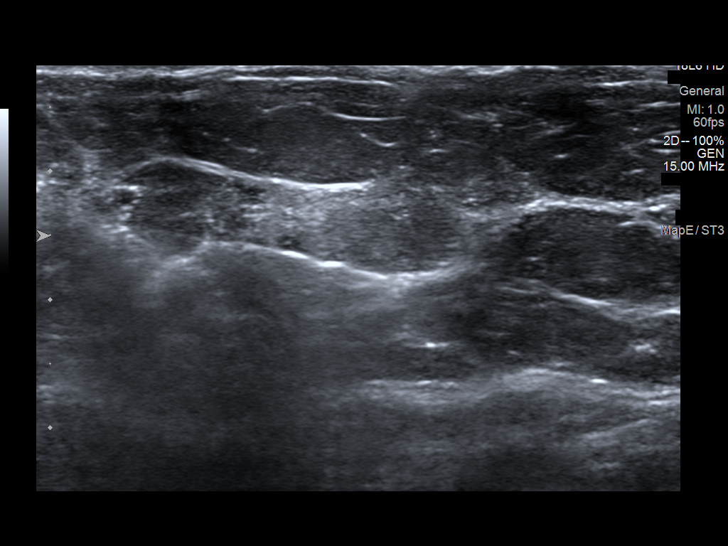
[im 8/14]
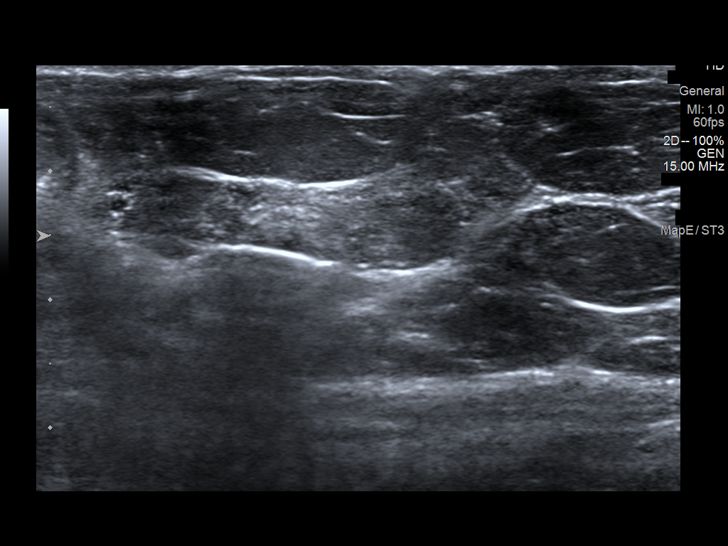
[im 9/14]
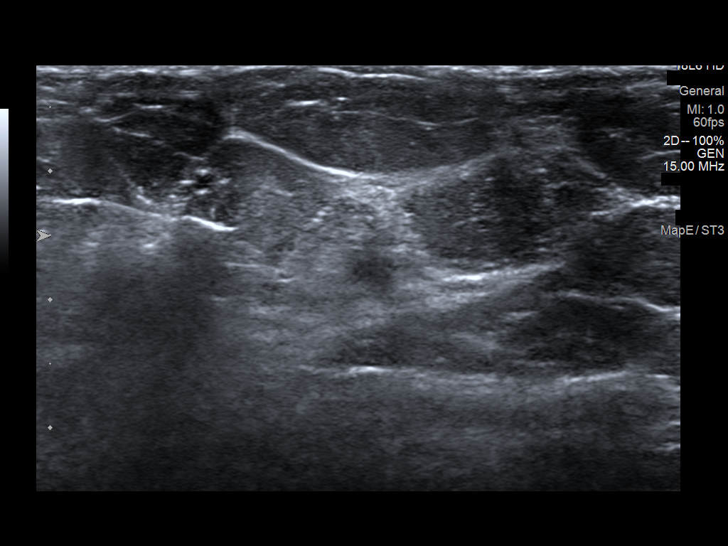
[im 10/14]
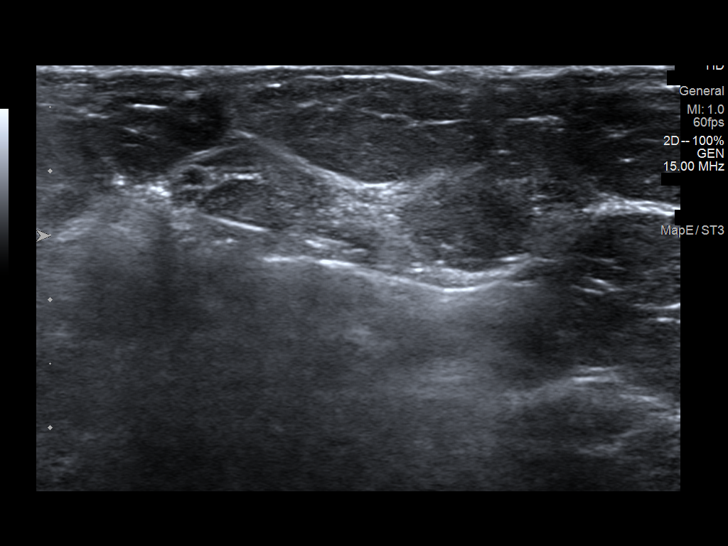
[im 12/14]
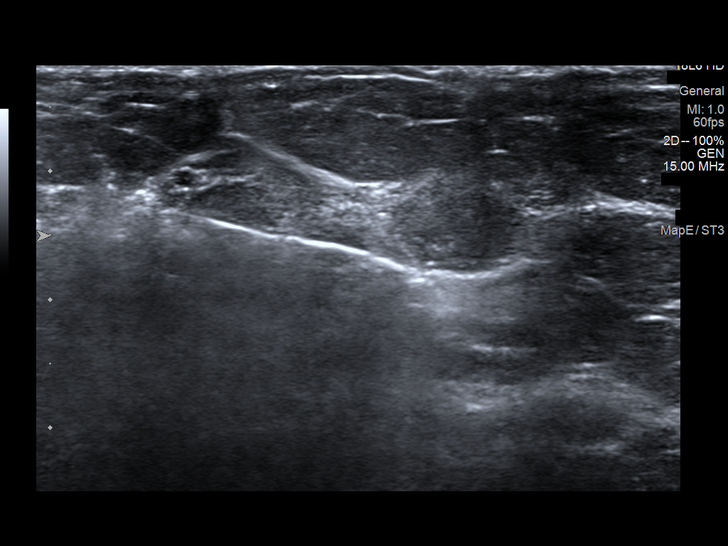
[im 13/14]
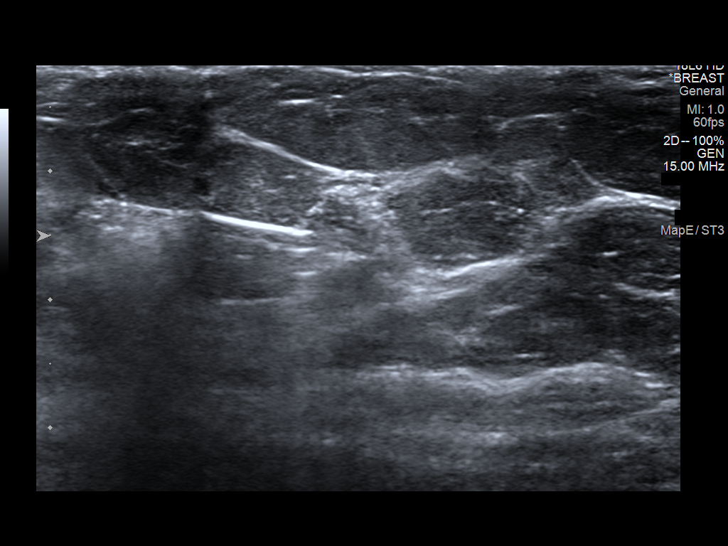
[im 14/14]
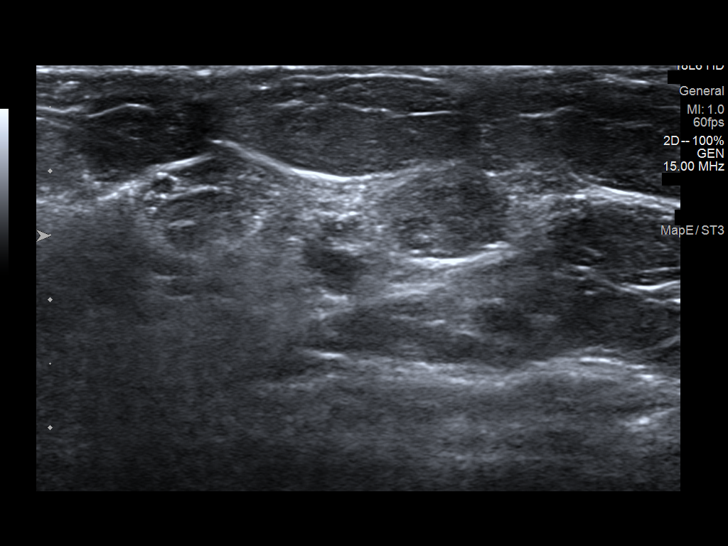

[12 of 14 positions shown; findings below may reference images not displayed]



Lesion quadrant: Upper outer quadrant

Using sterile technique and 1% Lidocaine as local anesthetic, under
direct ultrasound visualization, a 12 gauge Neilson device was
used to perform biopsy of a hypoechoic 1.0 cm mass 11 o'clock
position 7 cm from nipple using a lateral approach. At the
conclusion of the procedure a ribbon tissue marker clip was deployed
into the biopsy cavity. Follow up 2 view mammogram was performed and
dictated separately.
IMPRESSION: Ultrasound guided biopsy of the right breast. No apparent
complications.

ADDENDUM:
Pathology revealed GRADE II INVASIVE DUCTAL CARCINOMA, DUCTAL
CARCINOMA IN SITU WITH NECROSIS of the RIGHT breast, 11 o'clock, 7
cm from nipple. This was found to be concordant by Dr. Sravan Arvelo.

Pathology results were discussed with the patient by telephone. The
patient reported doing well after the biopsy with tenderness at the
site. Post biopsy instructions and care were reviewed and questions
were answered. The patient was encouraged to call The [REDACTED]

Surgical consultation has been arranged with Dr. Yoshitaro Alvizo at
[REDACTED] on March 16, 2019.

Pathology results reported by Dilki Len, RN on 03/13/2019.



Lesion quadrant: Upper outer quadrant

Using sterile technique and 1% Lidocaine as local anesthetic, under
direct ultrasound visualization, a 12 gauge Neilson device was
used to perform biopsy of a hypoechoic 1.0 cm mass 11 o'clock
position 7 cm from nipple using a lateral approach. At the
conclusion of the procedure a ribbon tissue marker clip was deployed
into the biopsy cavity. Follow up 2 view mammogram was performed and
dictated separately.
IMPRESSION: Ultrasound guided biopsy of the right breast. No apparent
complications.

## 2021-03-18 IMAGING — MG MM CLIP PLACEMENT
4 series · 4 of 12 positions shown · non-contrast
Comparison: Previous exam(s).

CLINICAL DATA: Ultrasound-guided core needle biopsy was performed
of a suspicious mass 11 o'clock position right breast

EXAM:
DIAGNOSTIC RIGHT MAMMOGRAM POST ULTRASOUND BIOPSY

[R ML synth-2D]
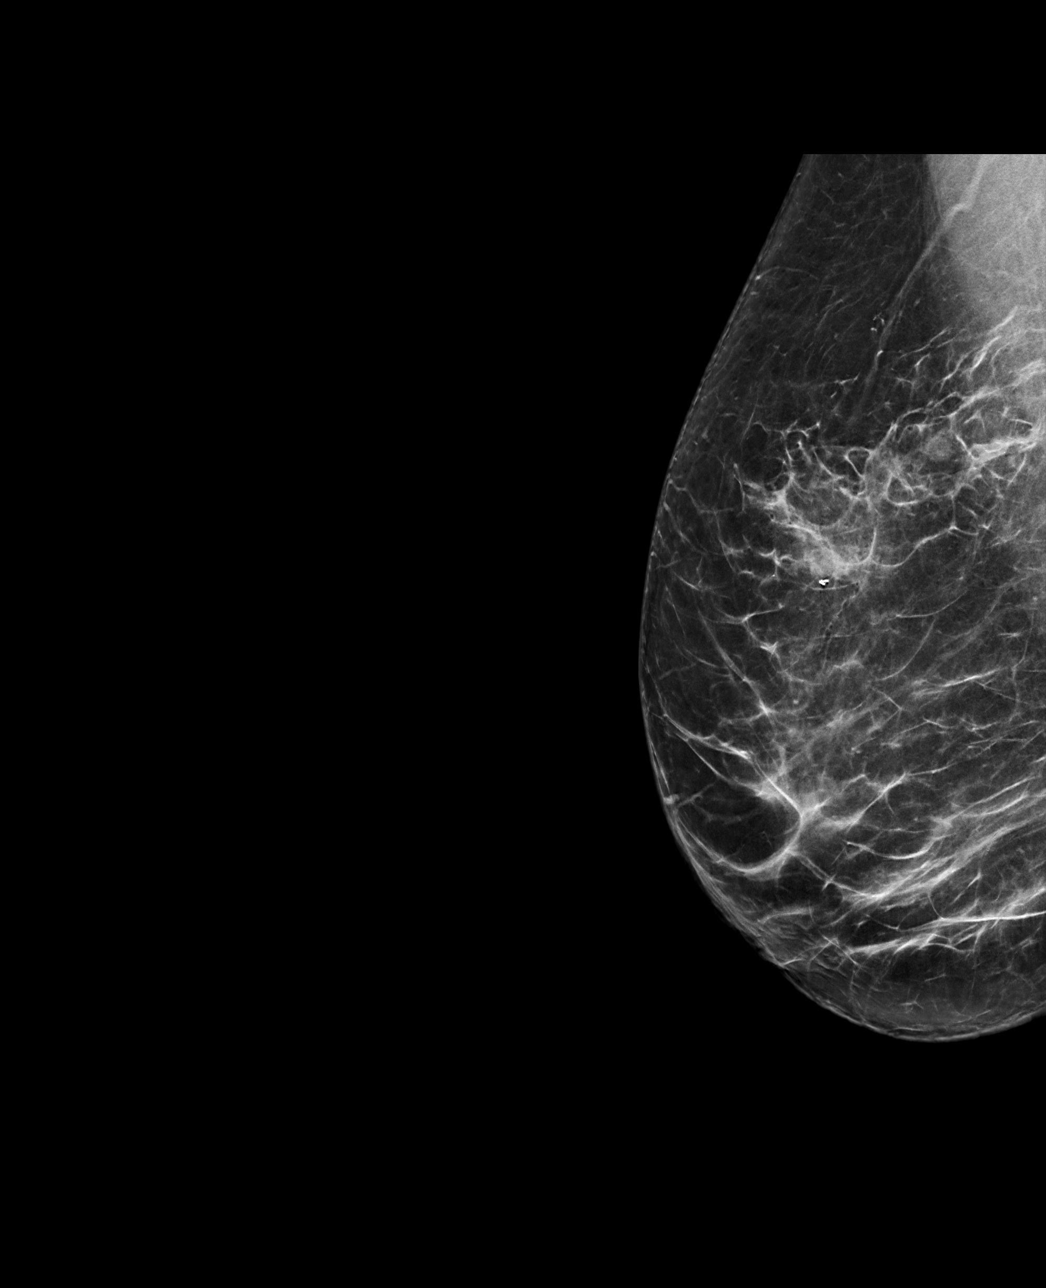

[R CC synth-2D]
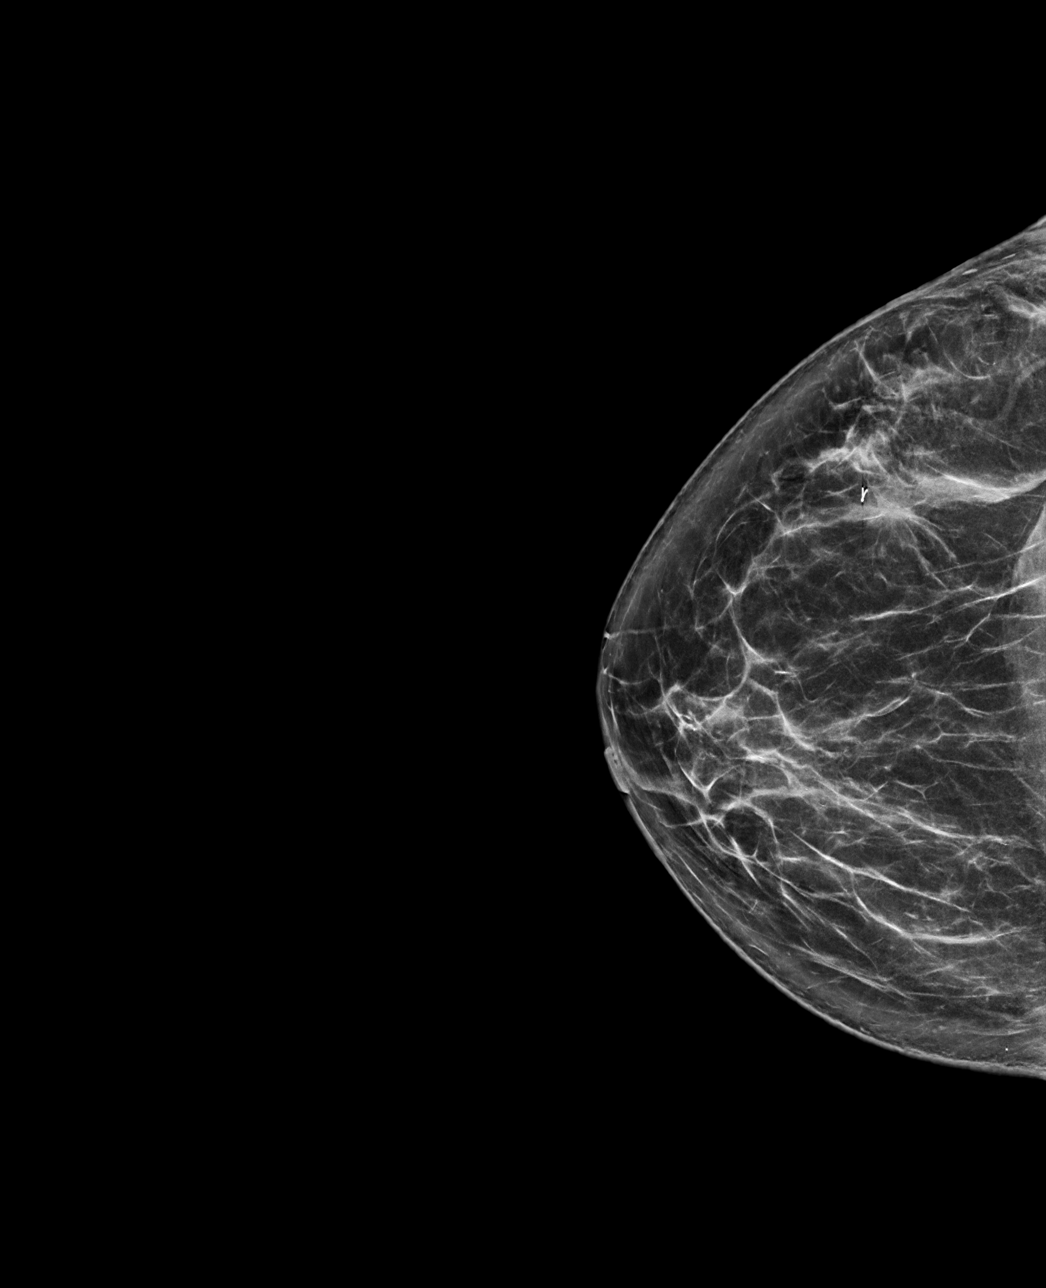

[R ML tomo · tomo slice 39/76.0]
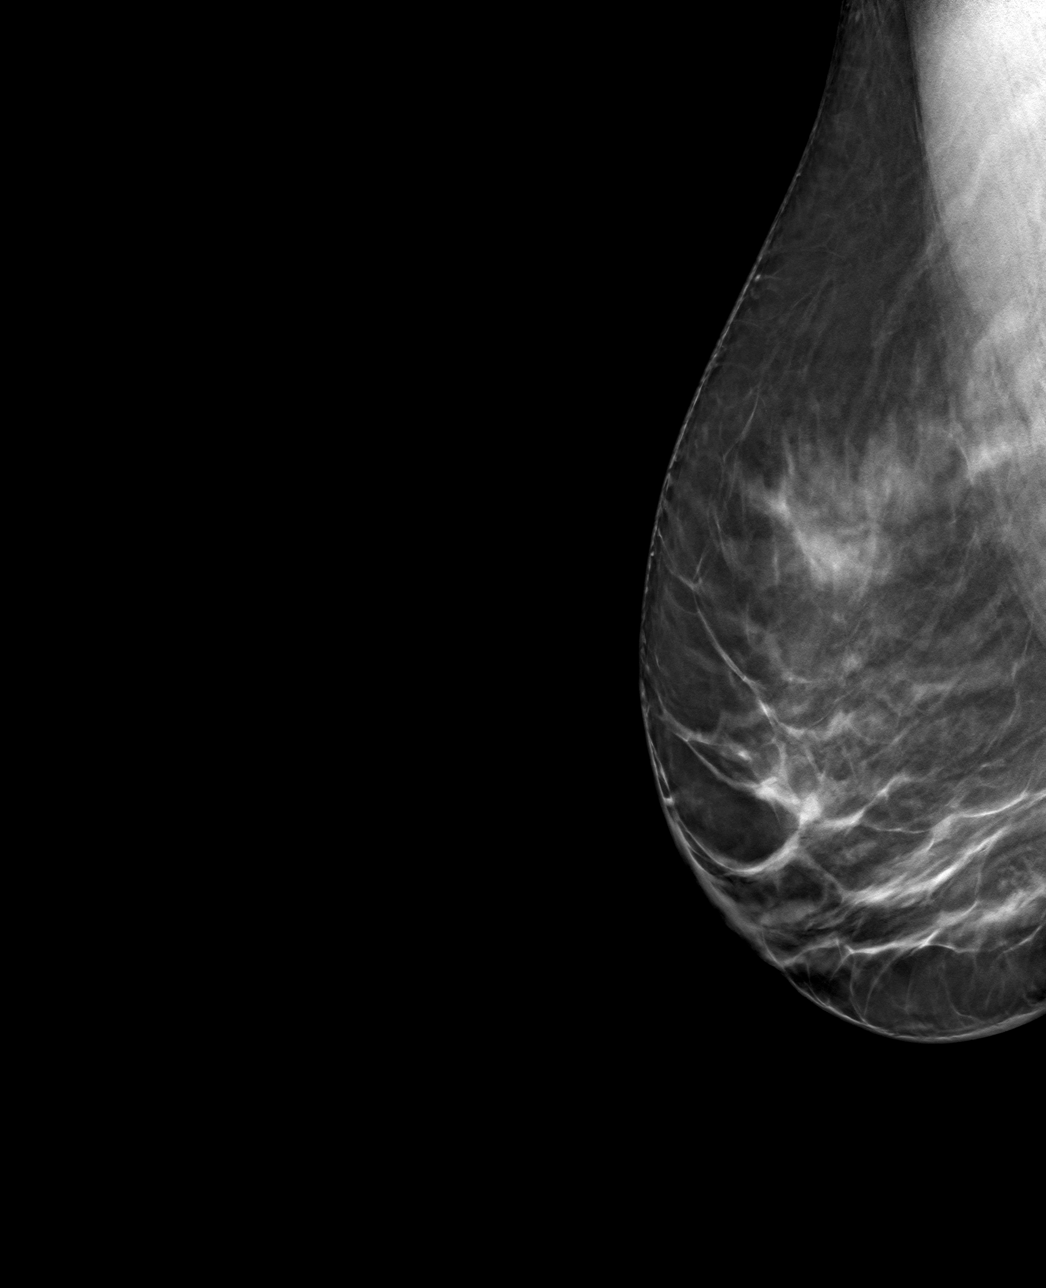

[R CC tomo · tomo slice 37/74.0]
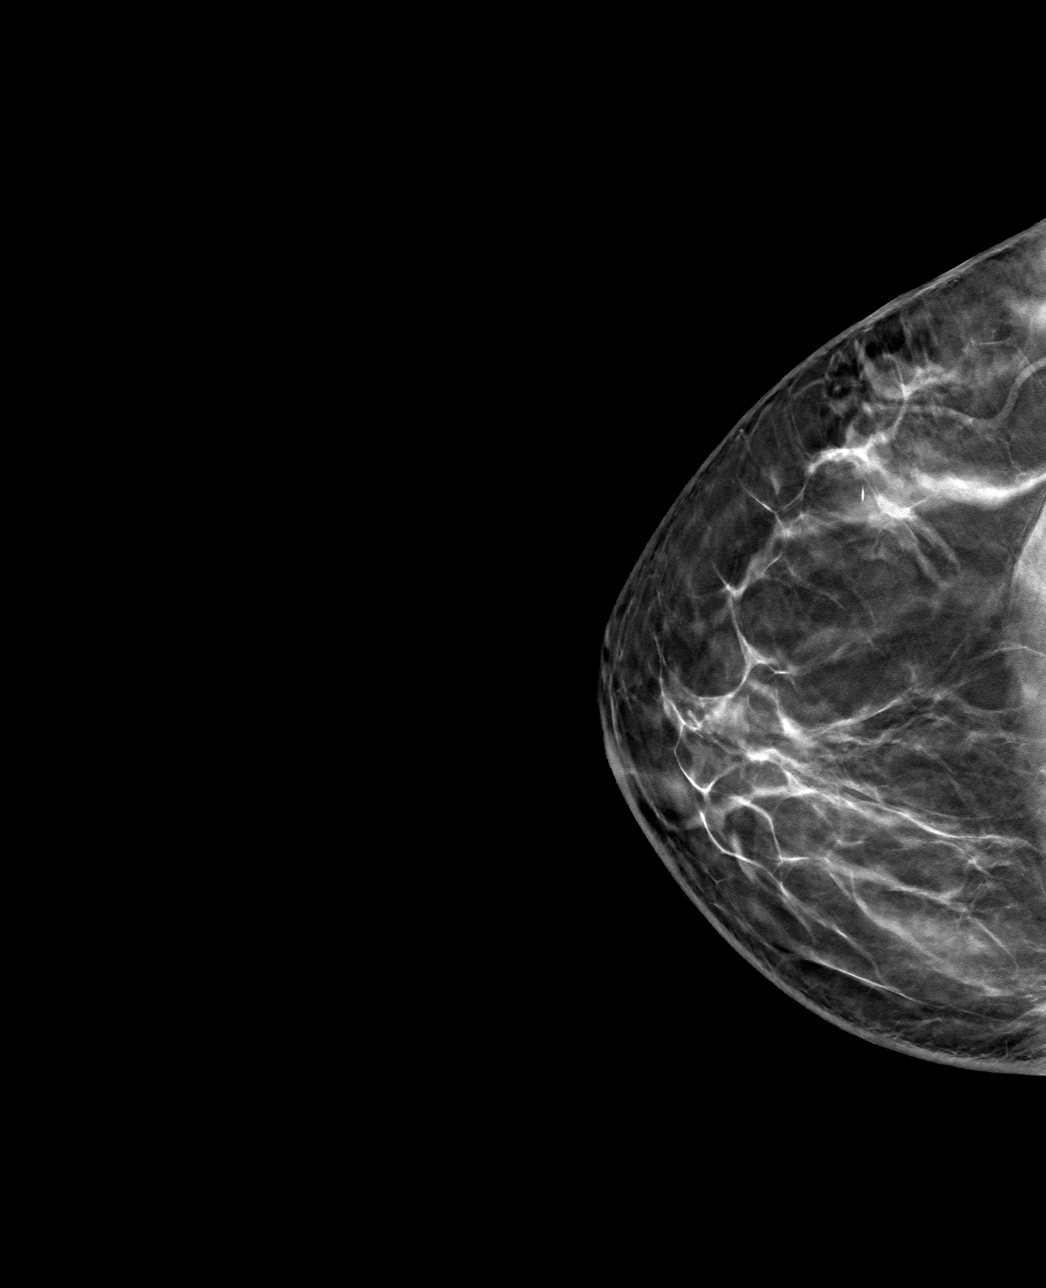

[4 of 12 positions shown; findings below may reference images not displayed]

FINDINGS: Mammographic images were obtained following ultrasound guided biopsy
of a right breast mass 11 o'clock position. Ribbon shaped biopsy
clip is satisfactorily positioned along the anterior aspect of the
biopsied mass.
IMPRESSION: Satisfactory position of ribbon shaped biopsy clip.

Final Assessment: Post Procedure Mammograms for Marker Placement

## 2021-03-18 NOTE — Assessment & Plan Note (Addendum)
Too early for repeat A1c. Most recently 8.7 01/2021. Assessed CBGs for appropriateness following glipizide initiation. Today appropriate and no hx of hypoglycemia since initiation. Patient does have sugar tabs at home in case of hypoglycemic episode. Will continue current regimen and follow up in 1 month for A1c. Of note, goal is 7 or < for patient's podiatry surgery.

## 2021-03-30 ENCOUNTER — Ambulatory Visit: Payer: Medicare Other | Admitting: Podiatry

## 2021-03-30 ENCOUNTER — Ambulatory Visit (INDEPENDENT_AMBULATORY_CARE_PROVIDER_SITE_OTHER): Payer: Medicare Other

## 2021-03-30 NOTE — Progress Notes (Signed)
1st attempt to connect with patient at 4:00 pm - LVM 2nd attempt to connect with patient at 4:08 pm - LVM Reached pt and she stated that she had forgot about the appt. Pt has been rescheduled to another date and time.

## 2021-04-05 ENCOUNTER — Ambulatory Visit (INDEPENDENT_AMBULATORY_CARE_PROVIDER_SITE_OTHER): Payer: Medicare Other

## 2021-04-05 VITALS — Ht 61.5 in | Wt 156.0 lb

## 2021-04-05 DIAGNOSIS — Z0000 Encounter for general adult medical examination without abnormal findings: Secondary | ICD-10-CM | POA: Diagnosis not present

## 2021-04-05 NOTE — Patient Instructions (Addendum)
You spoke to Megan Khan, [place] over the phone for your annual wellness visit.  We discussed goals:   Goals      HEMOGLOBIN A1C < 7       We also discussed recommended health maintenance. As discussed, you are due for:  Health Maintenance  Topic Date Due   Pneumococcal Vaccine 58-64 Years old (2 - PCV) 02/21/2017   COVID-19 Vaccine (4 - Booster for Pfizer series) 03/07/2021   INFLUENZA VACCINE  03/13/2021   FOOT EXAM  04/27/2021   OPHTHALMOLOGY EXAM  06/28/2021   HEMOGLOBIN A1C  07/19/2021   COLONOSCOPY (Pts 45-4yrs Insurance coverage will need to be confirmed)  01/21/2022   MAMMOGRAM  05/24/2022   PAP SMEAR-Modifier  03/01/2023   TETANUS/TDAP  02/21/2026   Hepatitis C Screening  Completed   HIV Screening  Completed   Zoster Vaccines- Shingrix  Completed   HPV VACCINES  Aged Out   PCP apt scheduled for 9/8 $Remov'@11am'XLxgcd$ . I have updated your health maintenance to reflect completed shingles vaccines. Fill out advance directive packet. We will have flu vaccines in September!   Preventive Care 28-90 Years Old, Female Preventive care refers to lifestyle choices and visits with your health care provider that can promote health and wellness. This includes: A yearly physical exam. This is also called an annual wellness visit. Regular dental and eye exams. Immunizations. Screening for certain conditions. Healthy lifestyle choices, such as: Eating a healthy diet. Getting regular exercise. Not using drugs or products that contain nicotine and tobacco. Limiting alcohol use. What can I expect for my preventive care visit? Physical exam Your health care provider will check your: Height and weight. These may be used to calculate your BMI (body mass index). BMI is a measurement that tells if you are at a healthy weight. Heart rate and blood pressure. Body temperature. Skin for abnormal spots. Counseling Your health care provider may ask you questions about your: Past medical  problems. Family's medical history. Alcohol, tobacco, and drug use. Emotional well-being. Home life and relationship well-being. Sexual activity. Diet, exercise, and sleep habits. Work and work Statistician. Access to firearms. Method of birth control. Menstrual cycle. Pregnancy history. What immunizations do I need?  Vaccines are usually given at various ages, according to a schedule. Your health care provider will recommend vaccines for you based on your age, medicalhistory, and lifestyle or other factors, such as travel or where you work. What tests do I need? Blood tests Lipid and cholesterol levels. These may be checked every 5 years, or more often if you are over 46 years old. Hepatitis C test. Hepatitis B test. Screening Lung cancer screening. You may have this screening every year starting at age 98 if you have a 30-pack-year history of smoking and currently smoke or have quit within the past 15 years. Colorectal cancer screening. All adults should have this screening starting at age 50 and continuing until age 75. Your health care provider may recommend screening at age 39 if you are at increased risk. You will have tests every 1-10 years, depending on your results and the type of screening test. Diabetes screening. This is done by checking your blood sugar (glucose) after you have not eaten for a while (fasting). You may have this done every 1-3 years. Mammogram. This may be done every 1-2 years. Talk with your health care provider about when you should start having regular mammograms. This may depend on whether you have a family history of breast cancer. BRCA-related  cancer screening. This may be done if you have a family history of breast, ovarian, tubal, or peritoneal cancers. Pelvic exam and Pap test. This may be done every 3 years starting at age 77. Starting at age 34, this may be done every 5 years if you have a Pap test in combination with an HPV test. Other  tests STD (sexually transmitted disease) testing, if you are at risk. Bone density scan. This is done to screen for osteoporosis. You may have this scan if you are at high risk for osteoporosis. Talk with your health care provider about your test results, treatment options,and if necessary, the need for more tests. Follow these instructions at home: Eating and drinking  Eat a diet that includes fresh fruits and vegetables, whole grains, lean protein, and low-fat dairy products. Take vitamin and mineral supplements as recommended by your health care provider. Do not drink alcohol if: Your health care provider tells you not to drink. You are pregnant, may be pregnant, or are planning to become pregnant. If you drink alcohol: Limit how much you have to 0-1 drink a day. Be aware of how much alcohol is in your drink. In the [place] one drink equals one 12 oz bottle of beer (355 mL), one 5 oz glass of wine (148 mL), or one 1 oz glass of hard liquor (44 mL).  Lifestyle Take daily care of your teeth and gums. Brush your teeth every morning and night with fluoride toothpaste. Floss one time each day. Stay active. Exercise for at least 30 minutes 5 or more days each week. Do not use any products that contain nicotine or tobacco, such as cigarettes, e-cigarettes, and chewing tobacco. If you need help quitting, ask your health care provider. Do not use drugs. If you are sexually active, practice safe sex. Use a condom or other form of protection to prevent STIs (sexually transmitted infections). If you do not wish to become pregnant, use a form of birth control. If you plan to become pregnant, see your health care provider for a prepregnancy visit. If told by your health care provider, take low-dose aspirin daily starting at age 9. Find healthy ways to cope with stress, such as: Meditation, yoga, or listening to music. Journaling. Talking to a trusted person. Spending time with friends and  family. Safety Always wear your seat belt while driving or riding in a vehicle. Do not drive: If you have been drinking alcohol. Do not ride with someone who has been drinking. When you are tired or distracted. While texting. Wear a helmet and other protective equipment during sports activities. If you have firearms in your house, make sure you follow all gun safety procedures. What's next? Visit your health care provider once a year for an annual wellness visit. Ask your health care provider how often you should have your eyes and teeth checked. Stay up to date on all vaccines. This information is not intended to replace advice given to you by your health care provider. Make sure you discuss any questions you have with your healthcare provider. Document Revised: 05/03/2020 Document Reviewed: 04/10/2018 Elsevier Patient Education  2022 [facility]'s number is [phone]. Please call with questions or concerns about what we discussed today.

## 2021-04-05 NOTE — Progress Notes (Signed)
Subjective:   Megan Khan is a 65 y.o. female who presents for Medicare Annual (Subsequent) preventive examination.  Patient consented to have virtual visit and was identified by name and date of birth. Method of visit: Telephone  Encounter participants: Patient: Megan Khan - located at Home Nurse/Provider: Steva Colder - located at [facility] Others (if applicable): NA  Review of Systems: Defer to PCP.   Cardiac Risk Factors include: advanced age (>4men, >77 women);diabetes mellitus;hypertension  Objective:   Vitals: Ht 5' 1.5" (1.562 m)   Wt 156 lb (70.8 kg)   BMI 29.00 kg/m   Body mass index is 29 kg/m.  Advanced Directives 04/05/2021 01/17/2021 10/27/2020 08/18/2020 07/18/2020 05/12/2020 04/27/2020  Does Patient Have a Medical Advance Directive? No No No No No No No  Would patient like information on creating a medical advance directive? Yes (MAU/Ambulatory/Procedural Areas - Information given) No - Patient declined No - Patient declined No - Patient declined No - Patient declined No - Patient declined No - Patient declined  Pre-existing out of facility DNR order (yellow form or pink MOST form) - - - - - - -   Tobacco Social History   Tobacco Use  Smoking Status Former   Types: Cigarettes   Quit date: 09/23/1995   Years since quitting: 25.5  Smokeless Tobacco Never     Counseling given: No plans to restart.  Clinical Intake:  Pre-visit preparation completed: Yes  Pain Score: 0-No pain  How often do you need to have someone help you when you read instructions, pamphlets, or other written materials from your doctor or pharmacy?: 2 - Rarely What is the last grade level you completed in school?: High School  Interpreter Needed?: No  Past Medical History:  Diagnosis Date   Anemia    during pregnancy   Breast cancer (HCC)    right breast 2020   Cancer (HCC) 2020   breast cancer on right   Diabetes mellitus    Type 2   Family history of breast  cancer    Family history of throat cancer    Glaucoma    Glaucoma    Hx of colonic polyps    Hyperlipidemia    Hypertension    Legally blind    completely blind in left eye, low vision in right   Personal history of chemotherapy 07/2019   stopped after 12 treatments   Personal history of radiation therapy    stopped 12/2019   Past Surgical History:  Procedure Laterality Date   BREAST EXCISIONAL BIOPSY     right breast 05/2019   BREAST LUMPECTOMY     BREAST LUMPECTOMY WITH RADIOACTIVE SEED AND SENTINEL LYMPH NODE BIOPSY Right 05/29/2019   Procedure: RIGHT BREAST LUMPECTOMY WITH RADIOACTIVE SEED AND RIGHT DEEP AXILLARY SENTINEL LYMPH NODE BIOPSY INJECT BLUE DYE;  Surgeon: Claud Kelp, MD;  Location: [facility] OR;  Service: General;  Laterality: Right;   COLONOSCOPY  01/22/2012   Procedure: COLONOSCOPY;  Surgeon: Graylin Shiver, MD;  Location: [facility] ENDOSCOPY;  Service: Endoscopy;  Laterality: N/A;   COLONOSCOPY     COLONOSCOPY     HERNIA REPAIR     PORT-A-CATH REMOVAL Right 10/27/2020   Procedure: REMOVAL PORT-A-CATH;  Surgeon: Almond Lint, MD;  Location: [facility] OR;  Service: General;  Laterality: Right;  ROOM 2 STARTING AT 03:00PM FOR 60 MIN   PORTACATH PLACEMENT N/A 05/29/2019   Procedure: INSERTION PORT-A-CATH WITH ULTRASOUND;  Surgeon: Claud Kelp, MD;  Location: [facility] OR;  Service:  General;  Laterality: N/A;   RE-EXCISION OF BREAST LUMPECTOMY Right 06/10/2019   Procedure: RE-EXCISION OF RIGHT BREAST LUMPECTOMY MARGINS;  Surgeon: Fanny Skates, MD;  Location: [facility];  Service: General;  Laterality: Right;   TUBAL LIGATION     Family History  Problem Relation Age of Onset   Diabetes Mother    Hypertension Mother    Diabetes Father    Hypertension Father    Breast cancer Sister 36       Her2 +   Hypertension Paternal Uncle    Hypertension Maternal Grandmother    Diabetes Maternal Grandmother    Hypertension Paternal Grandmother    Diabetes Paternal Grandmother    Breast cancer  Cousin        maternal second cousin, Bilateral mastectomy, diagnosed in her 72s   Social History   Socioeconomic History   Marital status: Divorced    Spouse name: Not on file   Number of children: 3   Years of education: 12   Highest education level: 12th grade  Occupational History   Occupation: Disabled  Tobacco Use   Smoking status: Former    Types: Cigarettes    Quit date: 09/23/1995    Years since quitting: 25.5   Smokeless tobacco: Never  Vaping Use   Vaping Use: Never used  Substance and Sexual Activity   Alcohol use: No   Drug use: Yes    Types: Marijuana   Sexual activity: Not Currently    Birth control/protection: Surgical, Post-menopausal    Comment: tubal Ligations  Other Topics Concern   Not on file  Social History Narrative   Patient lives alone in [place] with her dog Tiny.   Patient is legally blind and on disability.   Patient no longer drives, however has no concerns for transportation at this time.    Patient enjoys spending time with family, singing in her church choir, and sitting outside.    Patient has 2 living children and 1 who died last year (a patient of [facility].)   Social Determinants of Radio broadcast assistant Strain: Low Risk    Difficulty of Paying Living Expenses: Not hard at all  Food Insecurity: No Food Insecurity   Worried About Charity fundraiser in the Last Year: Never true   Arboriculturist in the Last Year: Never true  Transportation Needs: No Transportation Needs   Lack of Transportation (Medical): No   Lack of Transportation (Non-Medical): No  Physical Activity: Sufficiently Active   Days of Exercise per Week: 7 days   Minutes of Exercise per Session: 30 min  Stress: No Stress Concern Present   Feeling of Stress : Not at all  Social Connections: Moderately Integrated   Frequency of Communication with Friends and Family: More than three times a week   Frequency of Social Gatherings with Friends and Family: More than  three times a week   Attends Religious Services: More than 4 times per year   Active Member of [employer] or Organizations: Yes   Attends Archivist Meetings: More than 4 times per year   Marital Status: Divorced   Outpatient Encounter Medications as of 04/05/2021  Medication Sig   amLODipine (NORVASC) 10 MG tablet TAKE 1 TABLET(10 MG) BY MOUTH DAILY   Ascorbic Acid (VITAMIN C) 1000 MG tablet Take 2,000 mg by mouth daily.   bimatoprost (LUMIGAN) 0.01 % SOLN Place 2 drops into the left eye at bedtime.    blood glucose meter kit  and supplies KIT Dispense based on patient and insurance preference. Use up to four times daily as directed. (FOR ICD-9 250.00, 250.01).   Blood Glucose Monitoring Suppl (PRODIGY AUTOCODE BLOOD GLUCOSE) w/Device KIT 1 kit by Does not apply route in the morning, at noon, and at bedtime. Any brand acceptable if it works for patient   dorzolamide (TRUSOPT) 2 % ophthalmic solution Place 2 drops into the left eye 3 (three) times daily.    gabapentin (NEURONTIN) 100 MG capsule TAKE 2 CAPSULES(200 MG) BY MOUTH THREE TIMES DAILY   glipiZIDE (GLUCOTROL XL) 5 MG 24 hr tablet Take 1 tablet (5 mg total) by mouth daily.   lisinopril (ZESTRIL) 40 MG tablet Take 1 tablet (40 mg total) by mouth daily.   lovastatin (MEVACOR) 20 MG tablet Take 1 tablet (20 mg total) by mouth at bedtime.   metoprolol succinate (TOPROL-XL) 50 MG 24 hr tablet TAKE 1 TABLET(50 MG) BY MOUTH DAILY   spironolactone (ALDACTONE) 25 MG tablet Take 0.5 tablets (12.5 mg total) by mouth daily.   SYNJARDY 12.12-998 MG TABS TAKE 1 TABLET BY MOUTH TWICE DAILY   lidocaine-prilocaine (EMLA) cream Apply to affected area once (Patient not taking: Reported on 04/05/2021)   No facility-administered encounter medications on file as of 04/05/2021.   Activities of Daily Living In your present state of health, do you have any difficulty performing the following activities: 04/05/2021 10/27/2020  Hearing? N -  Vision? Y -   Difficulty concentrating or making decisions? N -  Walking or climbing stairs? N -  Dressing or bathing? N -  Doing errands, shopping? Y N  Comment feels more comfortable with family present -  Preparing Food and eating ? N -  Using the Toilet? N -  In the past six months, have you accidently leaked urine? N -  Do you have problems with loss of bowel control? N -  Managing your Medications? N -  Managing your Finances? N -  Housekeeping or managing your Housekeeping? N -  Some recent data might be hidden   Patient Care Team: Gerlene Fee, DO as PCP - General (Family Medicine) [name], [name], MD as PCP - Advanced Heart Failure (Cardiology) [name], [name], MD as Consulting Physician (Oncology) [name], MD as Consulting Physician (Radiation Oncology) [name], [name], DPM as Consulting Physician (Podiatry) [name], MD as Consulting Physician (Ophthalmology) [name], MD as Consulting Physician (Gastroenterology)    Assessment:   This is a routine wellness examination for [place].  Exercise Activities and Dietary recommendations Current Exercise Habits: Home exercise routine, Type of exercise: walking, Time (Minutes): 30, Frequency (Times/Week): 7, Weekly Exercise (Minutes/Week): 210   Goals      HEMOGLOBIN A1C < 7       Fall Risk Fall Risk  04/05/2021 01/17/2021 07/18/2020 04/27/2020 02/04/2020  Falls in the past year? 0 0 0 0 0  Number falls in past yr: - 0 0 0 0  Injury with Fall? - 0 0 0 -  Risk for fall due to : Impaired vision - - - -  Follow up Falls prevention discussed - - - Falls evaluation completed   Patient is legally blind: Patient does not use a cane at this time, however she will be trained to use one in the future.  Is the patient's home free of loose throw rugs in walkways, pet beds, electrical cords, etc?   yes      Grab bars in the bathroom? yes  Handrails on the stairs?   yes      Adequate lighting?   yes  Patient rating  of health (0-10) scale: 9  Depression Screen PHQ 2/9 Scores 04/05/2021 01/17/2021 07/18/2020 04/27/2020  PHQ - 2 Score 0 0 0 0  PHQ- 9 Score - 0 0 1    Cognitive Function  6CIT Screen 04/05/2021  What Year? 0 points  What month? 0 points  What time? 0 points  Count back from 20 0 points  Months in reverse 0 points  Repeat phrase 0 points  Total Score 0   Immunization History  Administered Date(s) Administered   Influenza Split 10/03/2011, 05/12/2012   Influenza Whole 05/26/2008, 09/07/2009, 05/17/2010   Influenza,inj,Quad PF,6+ Mos 05/21/2013, 05/13/2015, 08/08/2016, 04/19/2017, 05/22/2018, 06/24/2019, 04/27/2020   PFIZER Comirnaty(Gray Top)Covid-19 Tri-Sucrose Vaccine 12/06/2020, 12/06/2020   PFIZER(Purple Top)SARS-COV-2 Vaccination 03/12/2020, 04/05/2020   PPD Test 06/20/2012, 10/19/2013   Pneumococcal Polysaccharide-23 02/22/2016   Td 01/16/2005   Tdap 02/22/2016   Zoster Recombinat (Shingrix) 01/18/2021, 03/16/2021   Screening Tests Health Maintenance  Topic Date Due   Pneumococcal Vaccine 26-64 Years old (2 - PCV) 02/21/2017   COVID-19 Vaccine (4 - Booster for Pfizer series) 03/07/2021   INFLUENZA VACCINE  03/13/2021   FOOT EXAM  04/27/2021   OPHTHALMOLOGY EXAM  06/28/2021   HEMOGLOBIN A1C  07/19/2021   COLONOSCOPY (Pts 45-65yrs Insurance coverage will need to be confirmed)  01/21/2022   MAMMOGRAM  05/24/2022   PAP SMEAR-Modifier  03/01/2023   TETANUS/TDAP  02/21/2026   Hepatitis C Screening  Completed   HIV Screening  Completed   Zoster Vaccines- Shingrix  Completed   HPV VACCINES  Aged Out   Cancer Screenings: Lung: Low Dose CT Chest recommended if Age 70-80 years, 30 pack-year currently smoking OR have quit w/in 15years. Patient does not qualify. Breast:  Up to date on Mammogram? Yes   Up to date of Bone Density/Dexa? Yes Colorectal: UTD  Additional Screenings: Hepatitis C Screening: Completed  HIV Screening: Completed  Cervical Cancer Screening: Due  03/01/2023  Plan:  PCP apt scheduled for 9/8 $Remov'@11am'RwZSrH$ . I have updated your health maintenance to reflect completed shingles vaccines. Fill out advance directive packet. We will have flu vaccines in September!   I have personally reviewed and noted the following in the patient's chart:   Medical and social history Use of alcohol, tobacco or illicit drugs  Current medications and supplements Functional ability and status Nutritional status Physical activity Advanced directives List of other physicians Hospitalizations, surgeries, and ER visits in previous 12 months Vitals Screenings to include cognitive, depression, and falls Referrals and appointments  In addition, I have reviewed and discussed with patient certain preventive protocols, quality metrics, and best practice recommendations. A written personalized care plan for preventive services as well as general preventive health recommendations were provided to patient.  This visit was conducted virtually in the setting of the [place] pandemic.    Dorna Bloom, [place]  04/05/2021

## 2021-04-05 NOTE — Progress Notes (Signed)
I have reviewed this visit and agree with the documentation.   

## 2021-04-13 ENCOUNTER — Other Ambulatory Visit: Payer: Self-pay | Admitting: Family Medicine

## 2021-04-13 DIAGNOSIS — Z8639 Personal history of other endocrine, nutritional and metabolic disease: Secondary | ICD-10-CM

## 2021-04-14 ENCOUNTER — Other Ambulatory Visit: Payer: Self-pay | Admitting: Family Medicine

## 2021-04-14 DIAGNOSIS — E1165 Type 2 diabetes mellitus with hyperglycemia: Secondary | ICD-10-CM

## 2021-04-14 DIAGNOSIS — I10 Essential (primary) hypertension: Secondary | ICD-10-CM

## 2021-04-18 ENCOUNTER — Other Ambulatory Visit: Payer: Self-pay | Admitting: Oncology

## 2021-04-18 DIAGNOSIS — Z9889 Other specified postprocedural states: Secondary | ICD-10-CM

## 2021-04-20 ENCOUNTER — Encounter: Payer: Self-pay | Admitting: Family Medicine

## 2021-04-20 ENCOUNTER — Other Ambulatory Visit: Payer: Self-pay

## 2021-04-20 ENCOUNTER — Ambulatory Visit (INDEPENDENT_AMBULATORY_CARE_PROVIDER_SITE_OTHER): Payer: Medicare Other

## 2021-04-20 ENCOUNTER — Ambulatory Visit (INDEPENDENT_AMBULATORY_CARE_PROVIDER_SITE_OTHER): Payer: Medicare Other | Admitting: Family Medicine

## 2021-04-20 VITALS — BP 133/82 | HR 66 | Ht 61.5 in | Wt 160.4 lb

## 2021-04-20 DIAGNOSIS — M25511 Pain in right shoulder: Secondary | ICD-10-CM | POA: Diagnosis not present

## 2021-04-20 DIAGNOSIS — E785 Hyperlipidemia, unspecified: Secondary | ICD-10-CM

## 2021-04-20 DIAGNOSIS — G8929 Other chronic pain: Secondary | ICD-10-CM

## 2021-04-20 DIAGNOSIS — E1169 Type 2 diabetes mellitus with other specified complication: Secondary | ICD-10-CM | POA: Diagnosis not present

## 2021-04-20 DIAGNOSIS — E1165 Type 2 diabetes mellitus with hyperglycemia: Secondary | ICD-10-CM

## 2021-04-20 DIAGNOSIS — Z23 Encounter for immunization: Secondary | ICD-10-CM | POA: Diagnosis not present

## 2021-04-20 LAB — POCT GLYCOSYLATED HEMOGLOBIN (HGB A1C): HbA1c, POC (controlled diabetic range): 7.9 % — AB (ref 0.0–7.0)

## 2021-04-20 MED ORDER — GABAPENTIN 100 MG PO CAPS: ORAL_CAPSULE | ORAL | 1 refills | Status: DC

## 2021-04-20 MED ORDER — LOVASTATIN 40 MG PO TABS: 40.0000 mg | ORAL_TABLET | Freq: Every day | ORAL | 3 refills | Status: DC

## 2021-04-20 MED ORDER — GLIPIZIDE ER 5 MG PO TB24: 5.0000 mg | ORAL_TABLET | Freq: Every day | ORAL | 3 refills | Status: DC

## 2021-04-20 NOTE — Patient Instructions (Signed)
Thank you for coming in today.  It was really nice to see you. Today we discussed continuing same diabetic medication regimen.  Continue your healthy lifestyles to decrease your A1c for surgery.  You are doing really well you improved your A1c from 8.7 to now 7.9.  We will recheck this in 3 months.  We also increased your lovastatin to 40 mg. See you in 3 months if not sooner.  Dr. Janus Molder

## 2021-04-20 NOTE — Progress Notes (Signed)
    SUBJECTIVE:   CHIEF COMPLAINT / HPI:   Ms. Lyster is 65 yo F who present for follow up.   Diabetes Current Regimen: Synjardy 12.12-998 mg tab twice daily, glipizide '5mg'$  qhs  CBGs: 100s AM, 200s PM Last A1c: 8.7 on 01/18/2020 Denies polyuria, polydipsia, hypoglycemia (has sugar tablets at home) Last Eye Exam: 06/2020 Statin: Lovastatin 20 mg nightly ACE/ARB: Lisinopril 40 mg daily  HLD Discussed LDL results prior to visit and the need to increase statin medication. She has taken lovastatin 40 mg in the past and was lowered due to leg cramps. Amenable to increasing back to 40 mg today.   PERTINENT  PMH / PSH: Hx of breast cancer, glaucoma bilateral  OBJECTIVE:   BP 133/82   Pulse 66   Ht 5' 1.5" (1.562 m)   Wt 160 lb 6.4 oz (72.8 kg)   SpO2 100%   BMI 29.82 kg/m   Results for orders placed or performed in visit on 04/20/21 (from the past 24 hour(s))  HgB A1c     Status: Abnormal   Collection Time: 04/20/21 11:30 AM  Result Value Ref Range   Hemoglobin A1C     HbA1c POC (<> result, manual entry)     HbA1c, POC (prediabetic range)     HbA1c, POC (controlled diabetic range) 7.9 (A) 0.0 - 7.0 %    Physical Exam Vitals reviewed.  Constitutional:      General: She is not in acute distress. Cardiovascular:     Rate and Rhythm: Normal rate and regular rhythm.     Heart sounds: Normal heart sounds.  Pulmonary:     Effort: Pulmonary effort is normal.     Breath sounds: Normal breath sounds.  Musculoskeletal:     Right lower leg: No edema.     Left lower leg: No edema.  Neurological:     Mental Status: She is alert and oriented to person, place, and time.   ASSESSMENT/PLAN:   Type 2 diabetes mellitus with hyperglycemia (HCC) A1c with significant improvement. Goal of 7 for foot surgery. Will continue with current regimen and reiterate congruent healthy lifestyle choices. Will follow up in 3 months for repeat A1c.   Hyperlipidemia associated with type 2 diabetes  mellitus ([place]) LDL-108 01/17/21. Will increase lovastatin to 40 mg daily. Patient to double up on the 20 mg tablets and new 40 mg Rx sent to pharmacy. Call if leg cramps return. Consider switching to lipitor in the future if deemed necessary.    Gerlene Fee, [place]

## 2021-04-21 NOTE — Assessment & Plan Note (Addendum)
>>  ASSESSMENT AND PLAN FOR TYPE 2 DIABETES MELLITUS WITH HYPERGLYCEMIA (HCC) WRITTEN ON 04/21/2021  9:41 AM BY AUTRY-LOTT, Vitoria Conyer, DO  A1c with significant improvement. Goal of 7 for foot surgery. Will continue with current regimen and reiterate congruent healthy lifestyle choices. Will follow up in 3 months for repeat A1c.   >>ASSESSMENT AND PLAN FOR HYPERLIPIDEMIA ASSOCIATED WITH TYPE 2 DIABETES MELLITUS (HCC) WRITTEN ON 04/21/2021  9:43 AM BY AUTRY-LOTT, Shanah Guimaraes, DO  LDL-108 01/17/21. Will increase lovastatin to 40 mg daily. Patient to double up on the 20 mg tablets and new 40 mg Rx sent to pharmacy. Call if leg cramps return. Consider switching to lipitor in the future if deemed necessary.

## 2021-04-21 NOTE — Assessment & Plan Note (Signed)
LDL-108 01/17/21. Will increase lovastatin to 40 mg daily. Patient to double up on the 20 mg tablets and new 40 mg Rx sent to pharmacy. Call if leg cramps return. Consider switching to lipitor in the future if deemed necessary.

## 2021-04-28 ENCOUNTER — Other Ambulatory Visit: Payer: Self-pay | Admitting: Oncology

## 2021-04-28 DIAGNOSIS — Z853 Personal history of malignant neoplasm of breast: Secondary | ICD-10-CM

## 2021-05-03 ENCOUNTER — Encounter: Payer: Self-pay | Admitting: Oncology

## 2021-05-03 ENCOUNTER — Other Ambulatory Visit: Payer: Self-pay

## 2021-05-03 ENCOUNTER — Ambulatory Visit
Admission: RE | Admit: 2021-05-03 | Discharge: 2021-05-03 | Disposition: A | Discharge status: Home / Self Care | Payer: Medicare Other | Source: Ambulatory Visit | Attending: Oncology | Admitting: Oncology

## 2021-05-03 DIAGNOSIS — Z853 Personal history of malignant neoplasm of breast: Secondary | ICD-10-CM

## 2021-05-12 ENCOUNTER — Ambulatory Visit (INDEPENDENT_AMBULATORY_CARE_PROVIDER_SITE_OTHER): Payer: Medicare Other

## 2021-05-12 ENCOUNTER — Other Ambulatory Visit: Payer: Self-pay

## 2021-05-12 DIAGNOSIS — Z23 Encounter for immunization: Secondary | ICD-10-CM | POA: Diagnosis not present

## 2021-05-12 NOTE — Progress Notes (Signed)
Patient presents to nurse clinic for annual flu vaccination. Administered in LD, site unremarkable, tolerated injection well.   Talbot Grumbling, RN

## 2021-06-09 ENCOUNTER — Other Ambulatory Visit: Payer: Self-pay | Admitting: Family Medicine

## 2021-07-15 ENCOUNTER — Other Ambulatory Visit: Payer: Self-pay | Admitting: Family Medicine

## 2021-07-15 DIAGNOSIS — E1165 Type 2 diabetes mellitus with hyperglycemia: Secondary | ICD-10-CM

## 2021-07-15 DIAGNOSIS — I10 Essential (primary) hypertension: Secondary | ICD-10-CM

## 2021-07-15 DIAGNOSIS — N181 Chronic kidney disease, stage 1: Secondary | ICD-10-CM

## 2021-07-15 DIAGNOSIS — E1122 Type 2 diabetes mellitus with diabetic chronic kidney disease: Secondary | ICD-10-CM

## 2021-08-14 ENCOUNTER — Other Ambulatory Visit: Payer: Self-pay | Admitting: Family Medicine

## 2021-08-14 DIAGNOSIS — Z8639 Personal history of other endocrine, nutritional and metabolic disease: Secondary | ICD-10-CM

## 2021-08-15 ENCOUNTER — Encounter: Payer: Self-pay | Admitting: Oncology

## 2021-08-21 ENCOUNTER — Ambulatory Visit: Payer: Medicare Other | Admitting: Family Medicine

## 2021-08-21 NOTE — Progress Notes (Deleted)
° ° °  SUBJECTIVE:   CHIEF COMPLAINT / HPI:   ***  PERTINENT  PMH / PSH: ***  OBJECTIVE:   There were no vitals taken for this visit.  ***  ASSESSMENT/PLAN:   No problem-specific Assessment & Plan notes found for this encounter.     Gerlene Fee, [place]

## 2021-09-06 ENCOUNTER — Encounter: Payer: Self-pay | Admitting: Oncology

## 2021-09-13 ENCOUNTER — Ambulatory Visit (INDEPENDENT_AMBULATORY_CARE_PROVIDER_SITE_OTHER): Payer: Medicare Other | Admitting: Family Medicine

## 2021-09-13 ENCOUNTER — Ambulatory Visit (INDEPENDENT_AMBULATORY_CARE_PROVIDER_SITE_OTHER): Payer: Medicaid Other

## 2021-09-13 ENCOUNTER — Encounter: Payer: Self-pay | Admitting: Oncology

## 2021-09-13 ENCOUNTER — Encounter: Payer: Self-pay | Admitting: Family Medicine

## 2021-09-13 ENCOUNTER — Other Ambulatory Visit: Payer: Self-pay

## 2021-09-13 VITALS — BP 132/83 | HR 66 | Wt 162.8 lb

## 2021-09-13 DIAGNOSIS — Z23 Encounter for immunization: Secondary | ICD-10-CM | POA: Diagnosis present

## 2021-09-13 DIAGNOSIS — E1165 Type 2 diabetes mellitus with hyperglycemia: Secondary | ICD-10-CM | POA: Diagnosis not present

## 2021-09-13 LAB — POCT GLYCOSYLATED HEMOGLOBIN (HGB A1C): HbA1c, POC (controlled diabetic range): 8.7 % — AB (ref 0.0–7.0)

## 2021-09-13 MED ORDER — SYNJARDY XR 25-1000 MG PO TB24: 1.0000 | ORAL_TABLET | Freq: Every day | ORAL | 0 refills | Status: DC

## 2021-09-13 MED ORDER — SYNJARDY XR 12.5-1000 MG PO TB24: 1.0000 | ORAL_TABLET | Freq: Two times a day (BID) | ORAL | 0 refills | Status: DC

## 2021-09-13 NOTE — Progress Notes (Signed)
° °  Covid-19 Vaccination Clinic  Name:  Megan Khan    MRN: [id number] DOB: [birth year]  09/13/2021  [name] was observed post Covid-19 immunization for 15 minutes without incident. She was provided with Vaccine Information Sheet and instruction to access the V-Safe system.   [name] was instructed to call 911 with any severe reactions post vaccine: Difficulty breathing  Swelling of face and throat  A fast heartbeat  A bad rash all over body  Dizziness and weakness

## 2021-09-13 NOTE — Patient Instructions (Addendum)
Thank you for coming today.  Continue current medications.   Continue to check your blood sugars and we will follow-up in 3 months for another A1c.  Dr. Janus Molder

## 2021-09-13 NOTE — Progress Notes (Signed)
° ° °  SUBJECTIVE:   CHIEF COMPLAINT / HPI:   Megan Khan is a 66 yo F who presents for follow up.   Diabetes Current Regimen: Synjardy 12.12-998 mg tab twice daily, glipizide 5mg  qhs  CBGs: 87-120 Last A1c: 7.9 on 04/20/2021 Denies polyuria, polydipsia, hypoglycemia (has sugar tablets at home) Last Eye Exam: 06/2020 Statin: Lovastatin 40 mg nightly ACE/ARB: Lisinopril 40 mg daily  PERTINENT  PMH / PSH: Hx of breast cancer, glaucoma bilateral  OBJECTIVE:   BP 132/83    Pulse 66    Wt 162 lb 12.8 oz (73.8 kg)    SpO2 96%    BMI 30.26 kg/m   Results for orders placed or performed in visit on 09/13/21 (from the past 72 hour(s))  HgB A1c     Status: Abnormal   Collection Time: 09/13/21 11:20 AM  Result Value Ref Range   Hemoglobin A1C     HbA1c POC (<> result, manual entry)     HbA1c, POC (prediabetic range)     HbA1c, POC (controlled diabetic range) 8.7 (A) 0.0 - 7.0 %   General: Appears well, no acute distress. Age appropriate. Cardiac: RRR, normal heart sounds, no murmurs Respiratory: CTAB, normal effort Extremities: No edema or cyanosis. Skin: Warm and dry, no rashes noted Neuro: alert and oriented, no focal deficits Psych: normal affect  ASSESSMENT/PLAN:   1. Type 2 diabetes mellitus with hyperglycemia, without long-term current use of insulin (HCC) A1c elevated since last check. Previously well controlled. Highest dose of synjardy achieved. Discussed lifestyle changes and possible discontinuing glipizide at follow up. Consider GLP-1 medication at follow up as well.  Patient amenable to this plan.    Gerlene Fee, [place]

## 2021-09-26 ENCOUNTER — Other Ambulatory Visit: Payer: Self-pay | Admitting: Family Medicine

## 2021-09-26 DIAGNOSIS — G8929 Other chronic pain: Secondary | ICD-10-CM

## 2021-10-09 ENCOUNTER — Other Ambulatory Visit: Payer: Self-pay | Admitting: Family Medicine

## 2021-10-09 DIAGNOSIS — I10 Essential (primary) hypertension: Secondary | ICD-10-CM

## 2021-10-09 DIAGNOSIS — E1165 Type 2 diabetes mellitus with hyperglycemia: Secondary | ICD-10-CM

## 2021-10-24 ENCOUNTER — Other Ambulatory Visit: Payer: Self-pay | Admitting: Family Medicine

## 2021-10-24 MED ORDER — AMLODIPINE BESYLATE 10 MG PO TABS: ORAL_TABLET | ORAL | 2 refills | Status: DC

## 2021-11-04 ENCOUNTER — Other Ambulatory Visit: Payer: Self-pay | Admitting: Family Medicine

## 2021-11-04 DIAGNOSIS — Z8639 Personal history of other endocrine, nutritional and metabolic disease: Secondary | ICD-10-CM

## 2021-11-16 ENCOUNTER — Ambulatory Visit (INDEPENDENT_AMBULATORY_CARE_PROVIDER_SITE_OTHER): Payer: Medicare Other

## 2021-11-16 ENCOUNTER — Ambulatory Visit (INDEPENDENT_AMBULATORY_CARE_PROVIDER_SITE_OTHER): Payer: Medicare Other | Admitting: Podiatry

## 2021-11-16 DIAGNOSIS — S90851A Superficial foreign body, right foot, initial encounter: Secondary | ICD-10-CM | POA: Diagnosis not present

## 2021-11-16 DIAGNOSIS — E1165 Type 2 diabetes mellitus with hyperglycemia: Secondary | ICD-10-CM

## 2021-11-16 DIAGNOSIS — E119 Type 2 diabetes mellitus without complications: Secondary | ICD-10-CM

## 2021-11-16 NOTE — Patient Instructions (Signed)
Call [facility] at [phone] to schedule your ultrasound ? ?

## 2021-11-19 ENCOUNTER — Encounter: Payer: Self-pay | Admitting: Podiatry

## 2021-11-19 NOTE — Progress Notes (Signed)
?  Subjective:  ?Patient ID: Megan Khan, female    DOB: [birth year],  MRN: [id number] ? ?Chief Complaint  ?Patient presents with  ? lesion  ?   diabetic with knot under right foot/ possible something in foot(thorn)painful  ? ? ?66 y.o. female presents with the above complaint. History confirmed with patient. She noticed it the 1st Sunday in March, she had a thorn in the foot, pulled it out and thinks there may still be a piece in it.  ? ?Objective:  ?Physical Exam: ?warm, good capillary refill, no trophic changes or ulcerative lesions, normal DP and PT pulses, normal sensory exam, and small area in plantar midfoot no visible puncture wound but there is some slight hyperkeratosis. ? ?Radiographs: ?Multiple views x-ray of the right foot: Skin marker is in place in area of foot there is no visible foreign body noted ?Assessment:  ? ?1. Foreign body in foot, right, initial encounter   ?2. Type 2 diabetes mellitus with hyperglycemia, without long-term current use of insulin ([place])   ?3. Encounter for diabetic foot exam ([place])   ? ? ? ?Plan:  ?Patient was evaluated and treated and all questions answered. ? ?Patient educated on diabetes. Discussed proper diabetic foot care and discussed risks and complications of disease. Educated patient in depth on reasons to return to the office immediately should he/she discover anything concerning or new on the feet. All questions answered. Discussed proper shoes as well.  ? ?I reviewed the radiographs with her and discussed the possibility of a retained foreign body.  There is no evidence of clear puncture wound other than some hyperkeratosis which I debrided and was not able to obtain anything from this.  I recommended an ultrasound of the foot and this was ordered to evaluate for possibility of retained foreign body.  We discussed the possibility of excision if this is the case.  She would need to get her A1c down lower prior to pursuing this. ? ?Return for after ultrasound  to review.  ? ?

## 2021-11-21 ENCOUNTER — Ambulatory Visit
Admission: RE | Admit: 2021-11-21 | Discharge: 2021-11-21 | Disposition: A | Discharge status: Home / Self Care | Payer: Medicare Other | Source: Ambulatory Visit | Attending: Podiatry | Admitting: Podiatry

## 2021-11-21 DIAGNOSIS — S90851A Superficial foreign body, right foot, initial encounter: Secondary | ICD-10-CM

## 2021-11-30 ENCOUNTER — Other Ambulatory Visit: Payer: Self-pay | Admitting: Family Medicine

## 2021-11-30 DIAGNOSIS — E1165 Type 2 diabetes mellitus with hyperglycemia: Secondary | ICD-10-CM

## 2021-11-30 DIAGNOSIS — Z8639 Personal history of other endocrine, nutritional and metabolic disease: Secondary | ICD-10-CM

## 2021-11-30 DIAGNOSIS — I10 Essential (primary) hypertension: Secondary | ICD-10-CM

## 2021-11-30 DIAGNOSIS — G8929 Other chronic pain: Secondary | ICD-10-CM

## 2021-12-01 ENCOUNTER — Ambulatory Visit (INDEPENDENT_AMBULATORY_CARE_PROVIDER_SITE_OTHER): Payer: Medicare Other | Admitting: Student

## 2021-12-01 ENCOUNTER — Encounter: Payer: Self-pay | Admitting: Student

## 2021-12-01 VITALS — BP 134/91 | HR 69 | Wt 159.6 lb

## 2021-12-01 DIAGNOSIS — S39012A Strain of muscle, fascia and tendon of lower back, initial encounter: Secondary | ICD-10-CM | POA: Diagnosis not present

## 2021-12-01 NOTE — Assessment & Plan Note (Signed)
Presenting with left lower back pain without notable red flags of changes in urinary/bowel habits, spinal point tenderness, CVA tenderness, nor decreased sensation or mobility of lower extremities.  Low threshold for imaging given prior history of breast cancer although physical exam and history most likely muscle strain. Topical treatment advised at this time.  Return precautions discussed and consider imaging and potentially PT in the future. ?

## 2021-12-01 NOTE — Progress Notes (Signed)
?  SUBJECTIVE:  ? ?CHIEF COMPLAINT / HPI:  ? ?Left back pain: Began having pain around midday. She went to get some water, bent down and the pain "just hit her in my back". She dropped her cup due to the sharp pain. "I've been living off tylenol every 4 hours". It hurts when she sits. She has taken 3g of tylenol per day and is continuing to take her gabapentin as normal. Urinating normally, no hematuria or dysuria. Stooling 1-2x/day. Denies constipation, diarrhea. The pain has transitioned from sharp pain to dull pain. She feels like her back is swollen on that side.  ? ?PERTINENT  PMH / PSH: Hx of breast cancer, HTN, HLD, T2DM ? ?OBJECTIVE:  ?BP (!) 134/91   Pulse 69   Wt 159 lb 9.6 oz (72.4 kg)   SpO2 99%   BMI 29.67 kg/m?  ? ?General: NAD, pleasant, able to participate in exam ?MSK: Hypertonicity of left lower back, no midline tenderness or pain with palpation of spine, 5/5 strength of BLEs, side bending and rotation of spine WNL and nontender, no CVA tenderness ?Skin: warm and dry, no rashes noted ?Neuro: alert, no obvious focal deficits, BLEs sensation intact, gait appropriate ?Psych: Normal affect and mood ? ?ASSESSMENT/PLAN:  ?Strain of muscle, fascia and tendon of lower back, initial encounter ?Presenting with left lower back pain without notable red flags of changes in urinary/bowel habits, spinal point tenderness, CVA tenderness, nor decreased sensation or mobility of lower extremities.  Low threshold for imaging given prior history of breast cancer although physical exam and history most likely muscle strain. Topical treatment advised at this time.  Return precautions discussed and consider imaging and potentially PT in the future. ? ?Return if symptoms worsen or fail to improve. ?Wells Guiles, DO ?12/01/2021, 5:33 PM ?PGY-1, [place] ? ?

## 2021-12-01 NOTE — Patient Instructions (Signed)
It was great to see you today! Thank you for choosing [facility] for your primary care. Megan Khan was seen for left lower back pain. ? ?Today we addressed: ?Muscle strain: Given the history and physical exam, I suspect that you strained a muscle in your left lower back.  It is reassuring that you are urinating and stooling appropriately.  I would continue with the amount of Tylenol you are using.  You may also try over-the-counter lidocaine patch, Voltaren gel, or capsaicin cream.  Please continue to move as you are able but not overdo it as this may worsen the strain.  Please return as needed if this is not improving in the next 1 to 2 weeks. ? ?You should return to our clinic Return if symptoms worsen or fail to improve. ? ?Please arrive 15 minutes before your appointment to ensure smooth check in process.  We appreciate your efforts in making this happen. ? ?Take care and seek immediate care sooner if you develop any concerns.  ? ?Thank you for allowing me to participate in your care, ?Wells Guiles, DO ?12/01/2021, 3:30 PM ?PGY-1, [place] ?

## 2021-12-15 ENCOUNTER — Encounter: Payer: Self-pay | Admitting: Family Medicine

## 2021-12-15 ENCOUNTER — Ambulatory Visit (INDEPENDENT_AMBULATORY_CARE_PROVIDER_SITE_OTHER): Payer: Medicare Other | Admitting: Family Medicine

## 2021-12-15 VITALS — BP 120/70 | HR 67 | Wt 161.4 lb

## 2021-12-15 DIAGNOSIS — S39012A Strain of muscle, fascia and tendon of lower back, initial encounter: Secondary | ICD-10-CM

## 2021-12-15 DIAGNOSIS — E1165 Type 2 diabetes mellitus with hyperglycemia: Secondary | ICD-10-CM | POA: Diagnosis not present

## 2021-12-15 LAB — POCT GLYCOSYLATED HEMOGLOBIN (HGB A1C): HbA1c, POC (controlled diabetic range): 8.7 % — AB (ref 0.0–7.0)

## 2021-12-15 NOTE — Progress Notes (Signed)
? ? ?  SUBJECTIVE:  ? ?CHIEF COMPLAINT / HPI:  ? ?Diabetes ?Current Regimen:  Synjardy 12.12-998 mg tab twice daily, glipizide '5mg'$  qhs  ?CBGs: 96-120  ?Last A1c: 8.7 on 10/03/21  ?Denies polyuria, polydipsia, hypoglycemia ?Last Eye Exam: 12/14/21 Dr. Venetia Maxon R>8, L>33->40 ?Statin: Lovastatin 40 mg nightly ?ACE/ARB: Lisinopril 40 mg daily ? ?Back pain, follow up ?Continues to feel as though her back is stiff but much improved since her last visit. Plans to start walking more. She denies fever and weight loss at this time.  ? ?PERTINENT  PMH / PSH: HTN, OA, b/l glaucoma, hx of breast cancer ? ?OBJECTIVE:  ? ?BP 120/70   Pulse 67   Wt 161 lb 6.4 oz (73.2 kg)   SpO2 97%   BMI 30.00 kg/m?   ?General: Appears well, no acute distress. Age appropriate. ?Cardiac: RRR, normal heart sounds, no murmurs ?Respiratory: CTAB, normal effort ?Extremities: No edema or cyanosis. ?Skin: Warm and dry, no rashes noted ?Neuro: alert and oriented, no focal deficits ?Psych: normal affect ? ?ASSESSMENT/PLAN:  ?Type 2 diabetes mellitus with hyperglycemia, without long-term current use of insulin ([place]) ?A1c unchanged from last. GLP-1 discussed, patient reluctant to take injectable. Follow up in 3 months.  ?- HgB A1c ? ?Strain of muscle, fascia and tendon of lower back, initial encounter ?Seen in clinic for this issue 2 weeks ago.  ?Treated with topical. Improved. Feeling stiff but plans to continue to walk.  ?  ?Gerlene Fee, DO ?[place]  ?

## 2021-12-15 NOTE — Patient Instructions (Signed)
It was wonderful to see you today. ? ?Please bring ALL of your medications with you to every visit.  ? ?Today we talked about: ? ?Walking and or exercising to improve muscle strain ?Your A1c was unchanged from last we discussed possibly considering injectable medications such as Ozempic for better diabetes control.  Follow-up in 3 months for repeat A1c or sooner if needed for any other reason. ? ?If you haven't already, sign up for My Chart to have easy access to your labs results, and communication with your primary care physician. ? ?Please call the clinic at [phone] if your symptoms worsen or you have any concerns. It was our pleasure to serve you. ? ?Dr. Janus Molder ? ?

## 2021-12-17 NOTE — Assessment & Plan Note (Addendum)
Seen in clinic for this issue 2 weeks ago.  ?Treated with topical. Improved. Feeling stiff but plans to continue to walk.  ?

## 2022-01-03 ENCOUNTER — Other Ambulatory Visit: Payer: Self-pay | Admitting: Family Medicine

## 2022-01-03 DIAGNOSIS — E1169 Type 2 diabetes mellitus with other specified complication: Secondary | ICD-10-CM

## 2022-01-05 ENCOUNTER — Other Ambulatory Visit: Payer: Self-pay

## 2022-01-05 DIAGNOSIS — E1169 Type 2 diabetes mellitus with other specified complication: Secondary | ICD-10-CM

## 2022-01-06 MED ORDER — LOVASTATIN 40 MG PO TABS: 40.0000 mg | ORAL_TABLET | Freq: Every day | ORAL | 3 refills | Status: DC

## 2022-01-11 ENCOUNTER — Other Ambulatory Visit: Payer: Self-pay | Admitting: Family Medicine

## 2022-01-11 DIAGNOSIS — E1165 Type 2 diabetes mellitus with hyperglycemia: Secondary | ICD-10-CM

## 2022-01-12 ENCOUNTER — Other Ambulatory Visit: Payer: Self-pay

## 2022-01-12 DIAGNOSIS — E1165 Type 2 diabetes mellitus with hyperglycemia: Secondary | ICD-10-CM

## 2022-01-12 MED ORDER — SYNJARDY XR 12.5-1000 MG PO TB24: 1.0000 | ORAL_TABLET | Freq: Two times a day (BID) | ORAL | 0 refills | Status: DC

## 2022-01-12 MED ORDER — AMLODIPINE BESYLATE 10 MG PO TABS
ORAL_TABLET | ORAL | 2 refills | Status: DC
Start: 2022-01-12 — End: 2022-04-02

## 2022-02-05 ENCOUNTER — Other Ambulatory Visit: Payer: Self-pay | Admitting: Family Medicine

## 2022-02-05 DIAGNOSIS — G8929 Other chronic pain: Secondary | ICD-10-CM

## 2022-02-07 ENCOUNTER — Other Ambulatory Visit: Payer: Self-pay | Admitting: Family Medicine

## 2022-02-07 DIAGNOSIS — E1165 Type 2 diabetes mellitus with hyperglycemia: Secondary | ICD-10-CM

## 2022-02-07 NOTE — Telephone Encounter (Signed)
Spoke with pharmacy due to multiple refill requests. Patient was able to pick up 60 tablets on June 2nd, however, insurance will only cover 30 or 90 day supply. Current rx is for 45 day supply.   Please update quantity so patient can receive 90 day supply.   Talbot Grumbling, RN

## 2022-02-08 MED ORDER — SYNJARDY XR 12.5-1000 MG PO TB24: 1.0000 | ORAL_TABLET | Freq: Two times a day (BID) | ORAL | 0 refills | Status: DC

## 2022-02-08 NOTE — Addendum Note (Signed)
Addended by: Caralee Ates on: 02/08/2022 10:37 AM   Modules accepted: Orders

## 2022-02-08 NOTE — Telephone Encounter (Signed)
Pharmacy returns call to nurse line. They are requesting that quantity be updated to 180 tablets in order to provide patient with 90 day supply.   Please advise.  Talbot Grumbling, RN

## 2022-03-05 ENCOUNTER — Other Ambulatory Visit: Payer: Self-pay

## 2022-03-15 ENCOUNTER — Encounter: Payer: Self-pay | Admitting: Family Medicine

## 2022-03-15 ENCOUNTER — Ambulatory Visit (INDEPENDENT_AMBULATORY_CARE_PROVIDER_SITE_OTHER): Payer: Medicare Other | Admitting: Family Medicine

## 2022-03-15 VITALS — BP 115/70 | HR 69 | Wt 160.0 lb

## 2022-03-15 DIAGNOSIS — H548 Legal blindness, as defined in USA: Secondary | ICD-10-CM

## 2022-03-15 DIAGNOSIS — I10 Essential (primary) hypertension: Secondary | ICD-10-CM | POA: Diagnosis not present

## 2022-03-15 DIAGNOSIS — K5900 Constipation, unspecified: Secondary | ICD-10-CM

## 2022-03-15 DIAGNOSIS — Z1211 Encounter for screening for malignant neoplasm of colon: Secondary | ICD-10-CM

## 2022-03-15 DIAGNOSIS — Z1212 Encounter for screening for malignant neoplasm of rectum: Secondary | ICD-10-CM

## 2022-03-15 DIAGNOSIS — E1165 Type 2 diabetes mellitus with hyperglycemia: Secondary | ICD-10-CM

## 2022-03-15 LAB — POCT GLYCOSYLATED HEMOGLOBIN (HGB A1C): HbA1c, POC (controlled diabetic range): 8.6 % — AB (ref 0.0–7.0)

## 2022-03-15 MED ORDER — GLIPIZIDE 5 MG PO TABS: 5.0000 mg | ORAL_TABLET | Freq: Every day | ORAL | 3 refills | Status: DC

## 2022-03-15 NOTE — Progress Notes (Signed)
    SUBJECTIVE:   CHIEF COMPLAINT / HPI: Diabetes Follow up   Patient says her sugars have been in the normal range. Has been having occasional lows in the mornings, especially when she goes to [place] on Sundays as she does not eat beforehand. Otherwise, patient says she has been eating more late at night recently which she does not normally do. She says she used to exercise a lot; however, 2 years ago her son passed away from [place] and she moved to a new neighborhood that is not conducive to her walking. She is motivated now to restart and has her sister as her main support.   Patient has new constipation for the last week only had 1 BM this week. Has been on a road trip this week and thinks this caused her bloating and constipation. This is not normal for her. She denies hematochezia or melena. She had an unremarkable colonscopy in 2013.   PERTINENT  PMH / PSH:  T2DM, HTN, HLD, Obesity   OBJECTIVE:   BP 115/70   Pulse 69   Wt 160 lb (72.6 kg)   SpO2 94%   BMI 29.74 kg/m   Physical Exam Constitutional:      Appearance: Normal appearance.  HENT:     Head: Normocephalic.     Mouth/Throat:     Mouth: Mucous membranes are moist.     Pharynx: Oropharynx is clear.  Eyes:     Comments: Left eyelid mostly closed, legally blind, with some exopthalmos   Cardiovascular:     Rate and Rhythm: Normal rate and regular rhythm.     Pulses: Normal pulses.     Heart sounds: Normal heart sounds. No murmur heard.    No gallop.  Pulmonary:     Effort: Pulmonary effort is normal.     Breath sounds: Normal breath sounds.  Abdominal:     General: There is distension (some mild distension and bloating).     Palpations: Abdomen is soft.     Tenderness: There is no abdominal tenderness.  Skin:    General: Skin is warm and dry.     Comments: Diabetic foot exam: no ulcers, tears, lesions, nails well trimmed, lateral displacement of great toe on bilateral feet.   Neurological:     Mental Status: She  is alert.     Sensory: No sensory deficit (Able to feel dorsal and plantar surface of feet).      ASSESSMENT/PLAN:   Type 2 diabetes mellitus with hyperglycemia (HCC) A1c today 8.6 from 8.7  - Counseled regarding glipizide and avoiding lows, switched the short acting and will taking in the morning - Continue monitoring BS  - Continue Synjardy - Discussed SMART goal: walk 2 miles every other day by the end of the month  - Will discuss starting GLP 1 at next visit if A1c does not decrease (more open to it now that she knows injection only once a week)  - Yearly labs: Lipid panel, BMP - Follow up in 3 months   HYPERTENSION, BENIGN SYSTEMIC - Continue amlodipine, metoprolol  Legal blindness Discussed method of organizing medications so that she can be sure she is taking the correct ones   Constipation - Take home sennakot  - Ordered screening colonoscopy      Lowry Ram, MD [place]

## 2022-03-15 NOTE — Assessment & Plan Note (Signed)
-   Continue amlodipine, metoprolol

## 2022-03-15 NOTE — Patient Instructions (Signed)
I will be giving you a new prescription for glipizide. Please take it in the mornings.  Avoid low blood sugars by having a juice or snack with you always.  Also, start walking at least every other day for 2 miles for at least a month and then start walking every day. Continue to check your blood sugars and limit late night snacking.  Continue to take good care of your feet and see your eye doctor.  Please follow up with me in 3 months.

## 2022-03-15 NOTE — Assessment & Plan Note (Addendum)
Most likely dietary constipation/ incidental; however, as this is new onset in a 66yo concern for malignant etiology. Less likely w/o melena, or other alarm symptoms. Last colonoscopy in 2013 was normal.  - Take home sennakot  - Ordered screening colonoscopy

## 2022-03-15 NOTE — Assessment & Plan Note (Signed)
Discussed method of organizing medications so that she can be sure she is taking the correct ones

## 2022-03-15 NOTE — Assessment & Plan Note (Addendum)
A1c today 8.6 from 8.7. Sugars run from 96-160s. Home regimen: glipizide, synjardy .  - Counseled regarding glipizide and avoiding lows, switched the short acting and will taking in the morning - Continue monitoring BS  - Continue Synjardy - Discussed SMART goal: walk 2 miles every other day by the end of the month  - Will discuss starting GLP 1 at next visit if A1c does not decrease (more open to it now that she knows injection only once a week)  - Yearly labs: Lipid panel, BMP - Follow up in 3 months

## 2022-03-16 ENCOUNTER — Encounter: Payer: Self-pay | Admitting: Oncology

## 2022-03-16 LAB — LIPID PANEL
Chol/HDL Ratio: 3.2 ratio (ref 0.0–4.4)
Cholesterol, Total: 149 mg/dL (ref 100–199)
HDL: 47 mg/dL (ref >39)
LDL Chol Calc (NIH): 85 mg/dL (ref 0–99)
Triglycerides: 93 mg/dL (ref 0–149)
VLDL Cholesterol Cal: 17 mg/dL (ref 5–40)

## 2022-03-16 LAB — BASIC METABOLIC PANEL
BUN/Creatinine Ratio: 22 (ref 12–28)
BUN: 18 mg/dL (ref 8–27)
CO2: 21 mmol/L (ref 20–29)
Calcium: 10 mg/dL (ref 8.7–10.3)
Chloride: 107 mmol/L — ABNORMAL HIGH (ref 96–106)
Creatinine, Ser: 0.83 mg/dL (ref 0.57–1.00)
Glucose: 159 mg/dL — ABNORMAL HIGH (ref 70–99)
Potassium: 4.3 mmol/L (ref 3.5–5.2)
Sodium: 144 mmol/L (ref 134–144)
eGFR: 78 mL/min/{1.73_m2} (ref >59)

## 2022-03-26 ENCOUNTER — Other Ambulatory Visit: Payer: Self-pay | Admitting: Family Medicine

## 2022-03-26 DIAGNOSIS — Z1231 Encounter for screening mammogram for malignant neoplasm of breast: Secondary | ICD-10-CM

## 2022-03-28 ENCOUNTER — Other Ambulatory Visit: Payer: Self-pay

## 2022-04-01 ENCOUNTER — Other Ambulatory Visit: Payer: Self-pay | Admitting: Family Medicine

## 2022-04-01 DIAGNOSIS — E1165 Type 2 diabetes mellitus with hyperglycemia: Secondary | ICD-10-CM

## 2022-04-01 DIAGNOSIS — I10 Essential (primary) hypertension: Secondary | ICD-10-CM

## 2022-04-02 ENCOUNTER — Other Ambulatory Visit: Payer: Self-pay | Admitting: Family Medicine

## 2022-04-02 DIAGNOSIS — G8929 Other chronic pain: Secondary | ICD-10-CM

## 2022-04-09 ENCOUNTER — Other Ambulatory Visit: Payer: Self-pay | Admitting: Family Medicine

## 2022-04-09 DIAGNOSIS — G8929 Other chronic pain: Secondary | ICD-10-CM

## 2022-04-10 NOTE — Telephone Encounter (Signed)
Patient LVM on nurse line to check status of rx refill.  ° °Obe Ahlers C Rebel Laughridge, RN ° °

## 2022-04-11 ENCOUNTER — Other Ambulatory Visit: Payer: Self-pay | Admitting: Family Medicine

## 2022-04-11 DIAGNOSIS — G8929 Other chronic pain: Secondary | ICD-10-CM

## 2022-04-11 NOTE — Telephone Encounter (Signed)
Patient calls nurse line regarding prescription refill on gabapentin.   Patient takes 3 tablets of gabapentin 100 mg BID. 180 capsules (30 day supply) was picked up from pharmacy on 03/07/22.   Patient would like refill as soon as possible as she is out of medication.   Forwarding to PCP.  Talbot Grumbling, RN

## 2022-05-01 ENCOUNTER — Other Ambulatory Visit: Payer: Self-pay | Admitting: Family Medicine

## 2022-05-01 DIAGNOSIS — Z8639 Personal history of other endocrine, nutritional and metabolic disease: Secondary | ICD-10-CM

## 2022-05-01 DIAGNOSIS — E1165 Type 2 diabetes mellitus with hyperglycemia: Secondary | ICD-10-CM

## 2022-05-02 ENCOUNTER — Other Ambulatory Visit: Payer: Self-pay | Admitting: Family Medicine

## 2022-05-02 ENCOUNTER — Encounter: Payer: Self-pay | Admitting: Gastroenterology

## 2022-05-03 ENCOUNTER — Other Ambulatory Visit: Payer: Self-pay

## 2022-05-03 ENCOUNTER — Ambulatory Visit (INDEPENDENT_AMBULATORY_CARE_PROVIDER_SITE_OTHER): Payer: Medicare Other

## 2022-05-03 DIAGNOSIS — Z23 Encounter for immunization: Secondary | ICD-10-CM

## 2022-05-09 ENCOUNTER — Ambulatory Visit
Admission: RE | Admit: 2022-05-09 | Discharge: 2022-05-09 | Disposition: A | Discharge status: Home / Self Care | Payer: Medicare Other | Source: Ambulatory Visit | Attending: Family Medicine | Admitting: Family Medicine

## 2022-05-09 DIAGNOSIS — Z1231 Encounter for screening mammogram for malignant neoplasm of breast: Secondary | ICD-10-CM

## 2022-05-14 NOTE — Assessment & Plan Note (Signed)
Recommendations from Mammogram on 9/27 1. Bilateral diagnostic mammogram in 1 year. 2. Per protocol, as the patient is now 2 or more years status post lumpectomy, she would typically be allowed return to annual screening mammography in 1 year. However, given that the post lumpectomy changes within the outer RIGHT breast are difficult to image due to its position in the far posterior RIGHT breast, recommend additional bilateral diagnostic mammogram in 1 year to ensure stability of the currently benign appearance.

## 2022-05-21 ENCOUNTER — Ambulatory Visit (AMBULATORY_SURGERY_CENTER): Payer: Self-pay

## 2022-05-21 VITALS — Ht 61.0 in | Wt 159.0 lb

## 2022-05-21 DIAGNOSIS — Z86010 Personal history of colonic polyps: Secondary | ICD-10-CM

## 2022-05-21 MED ORDER — NA SULFATE-K SULFATE-MG SULF 17.5-3.13-1.6 GM/177ML PO SOLN: 1.0000 | Freq: Once | ORAL | 0 refills | Status: AC

## 2022-05-21 NOTE — Progress Notes (Signed)
No egg or soy allergy known to patient  No issues known to pt with past sedation with any surgeries or procedures Patient denies ever being told they had issues or difficulty with intubation  No FH of Malignant Hyperthermia Pt is not on diet pills Pt is not on home 02  Pt is not on blood thinners  Pt denies issues with constipation;  No A fib or A flutter Have any cardiac testing pending--NO Pt instructed to use Singlecare.com or GoodRx for a price reduction on prep  Insurance verified during Pre Visit=[employer] Medicare and Medicaid

## 2022-06-07 ENCOUNTER — Ambulatory Visit (AMBULATORY_SURGERY_CENTER): Payer: Medicare Other | Admitting: Gastroenterology

## 2022-06-07 ENCOUNTER — Encounter: Payer: Self-pay | Admitting: Gastroenterology

## 2022-06-07 VITALS — BP 129/73 | HR 63 | Temp 97.1°F | Resp 16 | Ht 61.0 in | Wt 159.0 lb

## 2022-06-07 DIAGNOSIS — Z86010 Personal history of colonic polyps: Secondary | ICD-10-CM

## 2022-06-07 DIAGNOSIS — K635 Polyp of colon: Secondary | ICD-10-CM

## 2022-06-07 DIAGNOSIS — D127 Benign neoplasm of rectosigmoid junction: Secondary | ICD-10-CM

## 2022-06-07 DIAGNOSIS — D123 Benign neoplasm of transverse colon: Secondary | ICD-10-CM

## 2022-06-07 DIAGNOSIS — Z09 Encounter for follow-up examination after completed treatment for conditions other than malignant neoplasm: Secondary | ICD-10-CM

## 2022-06-07 DIAGNOSIS — D125 Benign neoplasm of sigmoid colon: Secondary | ICD-10-CM

## 2022-06-07 MED ORDER — SODIUM CHLORIDE 0.9 % IV SOLN: 500.0000 mL | Freq: Once | INTRAVENOUS | Status: DC

## 2022-06-07 NOTE — Progress Notes (Signed)
Pt's states no medical or surgical changes since previsit or office visit. 

## 2022-06-07 NOTE — Op Note (Signed)
[place] Patient Name: Megan Khan Procedure Date: 06/07/2022 11:47 AM MRN: [id number] Endoscopist: Remo Lipps P. Havery Moros , MD, [id number] Age: 66 Referring MD:  Date of Birth: 1956/06/28 Gender: Female Account #: [id number] Procedure:                Colonoscopy Indications:              High risk colon cancer surveillance: Personal                            history of colonic polyps - last exam 01/2012 - 2                            adenomas removed Medicines:                Monitored Anesthesia Care Procedure:                Pre-Anesthesia Assessment:                           - Prior to the procedure, a History and Physical                            was performed, and patient medications and                            allergies were reviewed. The patient's tolerance of                            previous anesthesia was also reviewed. The risks                            and benefits of the procedure and the sedation                            options and risks were discussed with the patient.                            All questions were answered, and informed consent                            was obtained. Prior Anticoagulants: The patient has                            taken no anticoagulant or antiplatelet agents. ASA                            Grade Assessment: II - A patient with mild systemic                            disease. After reviewing the risks and benefits,                            the patient was deemed in satisfactory condition to  undergo the procedure.                           After obtaining informed consent, the colonoscope                            was passed under direct vision. Throughout the                            procedure, the patient's blood pressure, pulse, and                            oxygen saturations were monitored continuously. The                            PCF-HQ190L Colonoscope was  introduced through the                            anus and advanced to the the cecum, identified by                            appendiceal orifice and ileocecal valve. The                            colonoscopy was performed without difficulty. The                            patient tolerated the procedure well. The quality                            of the bowel preparation was good. The ileocecal                            valve, appendiceal orifice, and rectum were                            photographed. Scope In: 11:55:03 AM Scope Out: 06:26:94 PM Scope Withdrawal Time: 0 hours 17 minutes 11 seconds  Total Procedure Duration: 0 hours 21 minutes 16 seconds  Findings:                 The perianal and digital rectal examinations were                            normal.                           A 3 mm polyp was found in the splenic flexure. The                            polyp was sessile. The polyp was removed with a                            cold snare. Resection and retrieval were complete.  A 3 mm polyp was found in the sigmoid colon. The                            polyp was sessile. The polyp was removed with a                            cold snare. Resection and retrieval were complete.                           A diminutive polyp was found in the recto-sigmoid                            colon. The polyp was sessile. The polyp was removed                            with a cold snare. Resection and retrieval were                            complete.                           Internal hemorrhoids were found during retroflexion.                           The exam was otherwise without abnormality. Complications:            No immediate complications. Estimated blood loss:                            Minimal. Estimated Blood Loss:     Estimated blood loss was minimal. Impression:               - One 3 mm polyp at the splenic flexure, removed                             with a cold snare. Resected and retrieved.                           - One 3 mm polyp in the sigmoid colon, removed with                            a cold snare. Resected and retrieved.                           - One diminutive polyp at the recto-sigmoid colon,                            removed with a cold snare. Resected and retrieved.                           - Internal hemorrhoids.                           - The examination was otherwise normal. Recommendation:           -  Patient has a contact number available for                            emergencies. The signs and symptoms of potential                            delayed complications were discussed with the                            patient. Return to normal activities tomorrow.                            Written discharge instructions were provided to the                            patient.                           - Resume previous diet.                           - Continue present medications.                           - Await pathology results. Remo Lipps P. Linsi Humann, MD 06/07/2022 12:20:19 PM This report has been signed electronically.

## 2022-06-07 NOTE — Patient Instructions (Signed)
Handouts Provided:  Polyps  YOU HAD AN ENDOSCOPIC PROCEDURE TODAY AT THE [facility]:   Refer to the procedure report that was given to you for any specific questions about what was found during the examination.  If the procedure report does not answer your questions, please call your gastroenterologist to clarify.  If you requested that your care partner not be given the details of your procedure findings, then the procedure report has been included in a sealed envelope for you to review at your convenience later.  YOU SHOULD EXPECT: Some feelings of bloating in the abdomen. Passage of more gas than usual.  Walking can help get rid of the air that was put into your GI tract during the procedure and reduce the bloating. If you had a lower endoscopy (such as a colonoscopy or flexible sigmoidoscopy) you may notice spotting of blood in your stool or on the toilet paper. If you underwent a bowel prep for your procedure, you may not have a normal bowel movement for a few days.  Please Note:  You might notice some irritation and congestion in your nose or some drainage.  This is from the oxygen used during your procedure.  There is no need for concern and it should clear up in a day or so.  SYMPTOMS TO REPORT IMMEDIATELY:  Following lower endoscopy (colonoscopy or flexible sigmoidoscopy):  Excessive amounts of blood in the stool  Significant tenderness or worsening of abdominal pains  Swelling of the abdomen that is new, acute  Fever of 100F or higher  For urgent or emergent issues, a gastroenterologist can be reached at any hour by calling [phone]. Do not use MyChart messaging for urgent concerns.    DIET:  We do recommend a small meal at first, but then you may proceed to your regular diet.  Drink plenty of fluids but you should avoid alcoholic beverages for 24 hours.  ACTIVITY:  You should plan to take it easy for the rest of today and you should NOT DRIVE or use heavy  machinery until tomorrow (because of the sedation medicines used during the test).    FOLLOW UP: Our staff will call the number listed on your records the next business day following your procedure.  We will call around 7:15- 8:00 am to check on you and address any questions or concerns that you may have regarding the information given to you following your procedure. If we do not reach you, we will leave a message.     If any biopsies were taken you will be contacted by phone or by letter within the next 1-3 weeks.  Please call us at [phone] if you have not heard about the biopsies in 3 weeks.    SIGNATURES/CONFIDENTIALITY: You and/or your care partner have signed paperwork which will be entered into your electronic medical record.  These signatures attest to the fact that that the information above on your After Visit Summary has been reviewed and is understood.  Full responsibility of the confidentiality of this discharge information lies with you and/or your care-partner.  

## 2022-06-07 NOTE — Progress Notes (Signed)
Called to room to assist during endoscopic procedure.  Patient ID and intended procedure confirmed with present staff. Received instructions for my participation in the procedure from the performing physician.  

## 2022-06-07 NOTE — Progress Notes (Signed)
To pacu VSS. Report to RN.tb

## 2022-06-07 NOTE — Progress Notes (Signed)
[facility] History and Physical   Primary Care Physician:  Lowry Ram, MD   Reason for Procedure:   History of colon polyps  Plan:    colonoscopy     HPI: Megan Khan is a 66 y.o. female  here for colonoscopy surveillance. 2 adenomas removed on last exam 01/2012. She reports polyps remotely prior to 2013, no exams since then. Patient denies any bowel symptoms at this time. No family history of colon cancer known. Otherwise feels well without any cardiopulmonary symptoms.   I have discussed risks / benefits of anesthesia and endoscopic procedure with Elijah Birk and they wish to proceed with the exams as outlined today.    Past Medical History:  Diagnosis Date   Anemia    during pregnancy   Breast cancer ([place]) 2020   right breast lumpectomy 05-29-2019   Cataract 2016   RIGHT eye   Diabetes mellitus    Type 2=on meds   Family history of breast cancer    Family history of throat cancer    Glaucoma    bilateral - using eye drops   Hx of colonic polyps    Hyperlipidemia    on meds   Hypertension    on meds   Legally blind    completely blind in left eye, low vision in right   Personal history of chemotherapy 07/2019   stopped after 12 treatments   Personal history of radiation therapy    stopped 12/2019    Past Surgical History:  Procedure Laterality Date   BREAST EXCISIONAL BIOPSY     right breast 05/2019   BREAST LUMPECTOMY     BREAST LUMPECTOMY WITH RADIOACTIVE SEED AND SENTINEL LYMPH NODE BIOPSY Right 05/29/2019   Procedure: RIGHT BREAST LUMPECTOMY WITH RADIOACTIVE SEED AND RIGHT DEEP AXILLARY SENTINEL LYMPH NODE BIOPSY INJECT BLUE DYE;  Surgeon: Fanny Skates, MD;  Location: [facility];  Service: General;  Laterality: Right;   COLONOSCOPY  01/22/2012   Procedure: COLONOSCOPY;  Surgeon: [name], MD;  Location: [facility];  Service: Endoscopy;  Laterality: N/A;   COLONOSCOPY     COLONOSCOPY     HERNIA REPAIR      PORT-A-CATH REMOVAL Right 10/27/2020   Procedure: REMOVAL PORT-A-CATH;  Surgeon: Stark Klein, MD;  Location: [facility];  Service: General;  Laterality: Right;  ROOM 2 STARTING AT 03:00PM FOR 60 MIN   PORTACATH PLACEMENT N/A 05/29/2019   Procedure: INSERTION PORT-A-CATH WITH ULTRASOUND;  Surgeon: Fanny Skates, MD;  Location: [facility];  Service: General;  Laterality: N/A;   RE-EXCISION OF BREAST LUMPECTOMY Right 06/10/2019   Procedure: RE-EXCISION OF RIGHT BREAST LUMPECTOMY MARGINS;  Surgeon: Fanny Skates, MD;  Location: [facility];  Service: General;  Laterality: Right;   TUBAL LIGATION      Prior to Admission medications   Medication Sig Start Date End Date Taking? Authorizing Provider  amLODipine (NORVASC) 10 MG tablet TAKE 1 TABLET(10 MG) BY MOUTH DAILY 04/02/22  Yes Lowry Ram, MD  Ascorbic Acid (VITAMIN C) 1000 MG tablet Take 1,000 mg by mouth daily.   Yes [provider]  bimatoprost (LUMIGAN) 0.01 % SOLN Place 2 drops into the left eye at bedtime.    Yes [provider]  blood glucose meter kit and supplies KIT Dispense based on patient and insurance preference. Use up to four times daily as directed. (FOR ICD-9 250.00, 250.01). 09/29/15  Yes Gottschalk, Ashly M, DO  Blood Glucose Monitoring Suppl (PRODIGY AUTOCODE BLOOD GLUCOSE) w/Device KIT 1  kit by Does not apply route in the morning, at noon, and at bedtime. Any brand acceptable if it works for patient 01/04/20  Yes Criss Rosales, Scott, DO  dorzolamide (TRUSOPT) 2 % ophthalmic solution Place 2 drops into the left eye 3 (three) times daily.    Yes [provider]  Empagliflozin-metFORMIN HCl ER (SYNJARDY XR) 12.12-998 MG TB24 Take 1 tablet by mouth in the morning and at bedtime. 05/02/22  Yes Martyn Malay, MD  gabapentin (NEURONTIN) 100 MG capsule TAKE 2 CAPSULES(200 MG) BY MOUTH THREE TIMES DAILY 04/12/22  Yes Lowry Ram, MD  glipiZIDE (GLUCOTROL) 5 MG tablet Take 1 tablet (5 mg total) by mouth daily. 03/15/22  Yes  Lowry Ram, MD  lisinopril (ZESTRIL) 40 MG tablet TAKE 1 TABLET(40 MG) BY MOUTH DAILY 04/02/22  Yes Lowry Ram, MD  lovastatin (MEVACOR) 40 MG tablet Take 1 tablet (40 mg total) by mouth at bedtime. 01/06/22  Yes Autry-Lott, Naaman Plummer, DO  metoprolol succinate (TOPROL-XL) 50 MG 24 hr tablet TAKE 1 TABLET(50 MG) BY MOUTH DAILY 11/30/21  Yes Ezequiel Essex, MD  spironolactone (ALDACTONE) 25 MG tablet Take 0.5 tablets (12.5 mg total) by mouth daily. 05/02/22  Yes Martyn Malay, MD  VITAMIN D PO Take 1 tablet by mouth daily at 6 (six) AM.   Yes [provider]    Current Outpatient Medications  Medication Sig Dispense Refill   amLODipine (NORVASC) 10 MG tablet TAKE 1 TABLET(10 MG) BY MOUTH DAILY 30 tablet 2   Ascorbic Acid (VITAMIN C) 1000 MG tablet Take 1,000 mg by mouth daily.     bimatoprost (LUMIGAN) 0.01 % SOLN Place 2 drops into the left eye at bedtime.      blood glucose meter kit and supplies KIT Dispense based on patient and insurance preference. Use up to four times daily as directed. (FOR ICD-9 250.00, 250.01). 1 each 12   Blood Glucose Monitoring Suppl (PRODIGY AUTOCODE BLOOD GLUCOSE) w/Device KIT 1 kit by Does not apply route in the morning, at noon, and at bedtime. Any brand acceptable if it works for patient 1 kit 0   dorzolamide (TRUSOPT) 2 % ophthalmic solution Place 2 drops into the left eye 3 (three) times daily.      Empagliflozin-metFORMIN HCl ER (SYNJARDY XR) 12.12-998 MG TB24 Take 1 tablet by mouth in the morning and at bedtime. 180 tablet 3   gabapentin (NEURONTIN) 100 MG capsule TAKE 2 CAPSULES(200 MG) BY MOUTH THREE TIMES DAILY 180 capsule 1   glipiZIDE (GLUCOTROL) 5 MG tablet Take 1 tablet (5 mg total) by mouth daily. 90 tablet 3   lisinopril (ZESTRIL) 40 MG tablet TAKE 1 TABLET(40 MG) BY MOUTH DAILY 90 tablet 0   lovastatin (MEVACOR) 40 MG tablet Take 1 tablet (40 mg total) by mouth at bedtime. 90 tablet 3   metoprolol succinate (TOPROL-XL) 50 MG 24 hr tablet  TAKE 1 TABLET(50 MG) BY MOUTH DAILY 90 tablet 1   spironolactone (ALDACTONE) 25 MG tablet Take 0.5 tablets (12.5 mg total) by mouth daily. 45 tablet 3   VITAMIN D PO Take 1 tablet by mouth daily at 6 (six) AM.     Current Facility-Administered Medications  Medication Dose Route Frequency Provider Last Rate Last Admin   0.9 %  sodium chloride infusion  500 mL Intravenous Once Oney Folz, Carlota Raspberry, MD        Allergies as of 06/07/2022 - Review Complete 06/07/2022  Allergen Reaction Noted   Sulfamethoxazole Itching and Rash 05/28/2006  Family History  Problem Relation Age of Onset   Diabetes Mother    Hypertension Mother    Diabetes Father    Hypertension Father    Breast cancer Sister 41       Her2 +   Hypertension Paternal Uncle    Hypertension Maternal Grandmother    Diabetes Maternal Grandmother    Hypertension Paternal Grandmother    Diabetes Paternal Grandmother    Breast cancer Cousin        maternal second cousin, Bilateral mastectomy, diagnosed in her 17s   Colon polyps Neg Hx    Colon cancer Neg Hx    Esophageal cancer Neg Hx    Rectal cancer Neg Hx    Stomach cancer Neg Hx     Social History   Socioeconomic History   Marital status: Divorced    Spouse name: Not on file   Number of children: 3   Years of education: 12   Highest education level: 12th grade  Occupational History   Occupation: Disabled  Tobacco Use   Smoking status: Former    Types: Cigarettes    Quit date: 09/23/1995    Years since quitting: 26.7   Smokeless tobacco: Never  Vaping Use   Vaping Use: Never used  Substance and Sexual Activity   Alcohol use: No   Drug use: Not Currently    Types: Marijuana   Sexual activity: Not Currently    Birth control/protection: Surgical, Post-menopausal    Comment: tubal Ligations  Other Topics Concern   Not on file  Social History Narrative   Patient lives alone in [place] with her dog Tiny.   Patient is legally blind and on disability.    Patient no longer drives, however has no concerns for transportation at this time.    Patient enjoys spending time with family, singing in her church choir, and sitting outside.    Patient has 2 living children and 1 who died last year (a patient of [facility].)   Social Determinants of Health   Financial Resource Strain: Low Risk  (04/05/2021)   Overall Financial Resource Strain (CARDIA)    Difficulty of Paying Living Expenses: Not hard at all  Food Insecurity: No Food Insecurity (04/05/2021)   Hunger Vital Sign    Worried About Running Out of Food in the Last Year: Never true    [place] in the Last Year: Never true  Transportation Needs: No Transportation Needs (04/05/2021)   PRAPARE - Hydrologist (Medical): No    Lack of Transportation (Non-Medical): No  Physical Activity: Sufficiently Active (04/05/2021)   Exercise Vital Sign    Days of Exercise per Week: 7 days    Minutes of Exercise per Session: 30 min  Stress: No Stress Concern Present (04/05/2021)   [place]    Feeling of Stress : Not at all  Social Connections: Moderately Integrated (04/05/2021)   Social Connection and Isolation Panel [NHANES]    Frequency of Communication with Friends and Family: More than three times a week    Frequency of Social Gatherings with Friends and Family: More than three times a week    Attends Religious Services: More than 4 times per year    Active Member of [employer] or Organizations: Yes    Attends Archivist Meetings: More than 4 times per year    Marital Status: Divorced  Intimate Partner Violence: Not At Risk (04/05/2021)  Humiliation, Afraid, Rape, and Kick questionnaire    Fear of Current or Ex-Partner: No    Emotionally Abused: No    Physically Abused: No    Sexually Abused: No    Review of Systems: All other review of systems negative except as mentioned in the HPI.  Physical  Exam: Vital signs BP 120/75   Pulse 70   Temp (!) 97.1 F (36.2 C)   Ht $R'5\' 1"'Hb$  (1.549 m)   Wt 159 lb (72.1 kg)   SpO2 95%   BMI 30.04 kg/m   General:   Alert,  Well-developed, pleasant and cooperative in NAD Lungs:  Clear throughout to auscultation.   Heart:  Regular rate and rhythm Abdomen:  Soft, nontender and nondistended.   Neuro/Psych:  Alert and cooperative. Normal mood and affect. A and O x 3  Jolly Mango, MD [facility]

## 2022-06-08 ENCOUNTER — Other Ambulatory Visit: Payer: Self-pay

## 2022-06-08 ENCOUNTER — Telehealth: Payer: Self-pay | Admitting: *Deleted

## 2022-06-08 NOTE — Telephone Encounter (Signed)
No answer on  follow up call. Left message.   

## 2022-06-11 ENCOUNTER — Other Ambulatory Visit: Payer: Self-pay | Admitting: Family Medicine

## 2022-06-11 ENCOUNTER — Encounter: Payer: Self-pay | Admitting: Gastroenterology

## 2022-06-11 DIAGNOSIS — G8929 Other chronic pain: Secondary | ICD-10-CM

## 2022-06-30 ENCOUNTER — Other Ambulatory Visit: Payer: Self-pay | Admitting: Family Medicine

## 2022-06-30 DIAGNOSIS — E1165 Type 2 diabetes mellitus with hyperglycemia: Secondary | ICD-10-CM

## 2022-06-30 DIAGNOSIS — I10 Essential (primary) hypertension: Secondary | ICD-10-CM

## 2022-07-30 ENCOUNTER — Other Ambulatory Visit: Payer: Self-pay | Admitting: Family Medicine

## 2022-07-30 DIAGNOSIS — I10 Essential (primary) hypertension: Secondary | ICD-10-CM

## 2022-08-03 ENCOUNTER — Other Ambulatory Visit: Payer: Self-pay

## 2022-08-07 NOTE — Patient Instructions (Signed)
Megan Khan , Thank you for taking time to come for your Medicare Wellness Visit. I appreciate your ongoing commitment to your health goals. Please review the following plan we discussed and let me know if I can assist you in the future.   These are the goals we discussed:  Goals      Activity and Exercise Increased     Evidence-based guidance:  Review current exercise levels.  Assess patient perspective on exercise or activity level, barriers to increasing activity, motivation and readiness for change.  Recommend or set healthy exercise goal based on individual tolerance.  Encourage small steps toward making change in amount of exercise or activity.  Urge reduction of sedentary activities or screen time.  Promote group activities within the community or with family or support person.  Consider referral to rehabiliation therapist for assessment and exercise/activity plan.   Notes:   SMART goal: walk 2 miles every other day by the end of the month      HEMOGLOBIN A1C < 7        This is a list of the screening recommended for you and due dates:  Health Maintenance  Topic Date Due   Yearly kidney health urinalysis for diabetes  Never done   Pneumonia Vaccine (2 - PCV) 10/26/2021   DEXA scan (bone density measurement)  Never done   Medicare Annual Wellness Visit  04/05/2022   COVID-19 Vaccine (7 - 2023-24 season) 08/24/2022*   Hemoglobin A1C  09/15/2022   Complete foot exam   11/17/2022   Pap Smear  03/01/2023   Yearly kidney function blood test for diabetes  03/16/2023   Eye exam for diabetics  07/13/2023   Mammogram  05/09/2024   DTaP/Tdap/Td vaccine (3 - Td or Tdap) 02/21/2026   Colon Cancer Screening  06/07/2032   Flu Shot  Completed   Hepatitis C Screening: USPSTF Recommendation to screen - Ages 18-79 yo.  Completed   HIV Screening  Completed   Zoster (Shingles) Vaccine  Completed   HPV Vaccine  Aged Out  *Topic was postponed. The date shown is not the original due date.     Advanced directives: none  Conditions/risks identified: none  Next appointment: Follow up in one year for your annual wellness visit    Preventive Care 65 Years and Older, Female Preventive care refers to lifestyle choices and visits with your health care provider that can promote health and wellness. What does preventive care include? A yearly physical exam. This is also called an annual well check. Dental exams once or twice a year. Routine eye exams. Ask your health care provider how often you should have your eyes checked. Personal lifestyle choices, including: Daily care of your teeth and gums. Regular physical activity. Eating a healthy diet. Avoiding tobacco and drug use. Limiting alcohol use. Practicing safe sex. Taking low-dose aspirin every day. Taking vitamin and mineral supplements as recommended by your health care provider. What happens during an annual well check? The services and screenings done by your health care provider during your annual well check will depend on your age, overall health, lifestyle risk factors, and family history of disease. Counseling  Your health care provider may ask you questions about your: Alcohol use. Tobacco use. Drug use. Emotional well-being. Home and relationship well-being. Sexual activity. Eating habits. History of falls. Memory and ability to understand (cognition). Work and work Statistician. Reproductive health. Screening  You may have the following tests or measurements: Height, weight, and BMI. Blood pressure. Lipid  and cholesterol levels. These may be checked every 5 years, or more frequently if you are over 67 years old. Skin check. Lung cancer screening. You may have this screening every year starting at age 24 if you have a 30-pack-year history of smoking and currently smoke or have quit within the past 15 years. Fecal occult blood test (FOBT) of the stool. You may have this test every year starting at age  40. Flexible sigmoidoscopy or colonoscopy. You may have a sigmoidoscopy every 5 years or a colonoscopy every 10 years starting at age 45. Hepatitis C blood test. Hepatitis B blood test. Sexually transmitted disease (STD) testing. Diabetes screening. This is done by checking your blood sugar (glucose) after you have not eaten for a while (fasting). You may have this done every 1-3 years. Bone density scan. This is done to screen for osteoporosis. You may have this done starting at age 73. Mammogram. This may be done every 1-2 years. Talk to your health care provider about how often you should have regular mammograms. Talk with your health care provider about your test results, treatment options, and if necessary, the need for more tests. Vaccines  Your health care provider may recommend certain vaccines, such as: Influenza vaccine. This is recommended every year. Tetanus, diphtheria, and acellular pertussis (Tdap, Td) vaccine. You may need a Td booster every 10 years. Zoster vaccine. You may need this after age 43. Pneumococcal 13-valent conjugate (PCV13) vaccine. One dose is recommended after age 77. Pneumococcal polysaccharide (PPSV23) vaccine. One dose is recommended after age 91. Talk to your health care provider about which screenings and vaccines you need and how often you need them. This information is not intended to replace advice given to you by your health care provider. Make sure you discuss any questions you have with your health care provider. Document Released: 08/26/2015 Document Revised: 04/18/2016 Document Reviewed: 05/31/2015 Elsevier Interactive Patient Education  2017 [employer] Prevention in the Home Falls can cause injuries. They can happen to people of all ages. There are many things you can do to make your home safe and to help prevent falls. What can I do on the outside of my home? Regularly fix the edges of walkways and driveways and fix any cracks. Remove  anything that might make you trip as you walk through a door, such as a raised step or threshold. Trim any bushes or trees on the path to your home. Use bright outdoor lighting. Clear any walking paths of anything that might make someone trip, such as rocks or tools. Regularly check to see if handrails are loose or broken. Make sure that both sides of any steps have handrails. Any raised decks and porches should have guardrails on the edges. Have any leaves, snow, or ice cleared regularly. Use sand or salt on walking paths during winter. Clean up any spills in your garage right away. This includes oil or grease spills. What can I do in the bathroom? Use night lights. Install grab bars by the toilet and in the tub and shower. Do not use towel bars as grab bars. Use non-skid mats or decals in the tub or shower. If you need to sit down in the shower, use a plastic, non-slip stool. Keep the floor dry. Clean up any water that spills on the floor as soon as it happens. Remove soap buildup in the tub or shower regularly. Attach bath mats securely with double-sided non-slip rug tape. Do not have throw rugs and  other things on the floor that can make you trip. What can I do in the bedroom? Use night lights. Make sure that you have a light by your bed that is easy to reach. Do not use any sheets or blankets that are too big for your bed. They should not hang down onto the floor. Have a firm chair that has side arms. You can use this for support while you get dressed. Do not have throw rugs and other things on the floor that can make you trip. What can I do in the kitchen? Clean up any spills right away. Avoid walking on wet floors. Keep items that you use a lot in easy-to-reach places. If you need to reach something above you, use a strong step stool that has a grab bar. Keep electrical cords out of the way. Do not use floor polish or wax that makes floors slippery. If you must use wax, use  non-skid floor wax. Do not have throw rugs and other things on the floor that can make you trip. What can I do with my stairs? Do not leave any items on the stairs. Make sure that there are handrails on both sides of the stairs and use them. Fix handrails that are broken or loose. Make sure that handrails are as long as the stairways. Check any carpeting to make sure that it is firmly attached to the stairs. Fix any carpet that is loose or worn. Avoid having throw rugs at the top or bottom of the stairs. If you do have throw rugs, attach them to the floor with carpet tape. Make sure that you have a light switch at the top of the stairs and the bottom of the stairs. If you do not have them, ask someone to add them for you. What else can I do to help prevent falls? Wear shoes that: Do not have high heels. Have rubber bottoms. Are comfortable and fit you well. Are closed at the toe. Do not wear sandals. If you use a stepladder: Make sure that it is fully opened. Do not climb a closed stepladder. Make sure that both sides of the stepladder are locked into place. Ask someone to hold it for you, if possible. Clearly mark and make sure that you can see: Any grab bars or handrails. First and last steps. Where the edge of each step is. Use tools that help you move around (mobility aids) if they are needed. These include: Canes. Walkers. Scooters. Crutches. Turn on the lights when you go into a dark area. Replace any light bulbs as soon as they burn out. Set up your furniture so you have a clear path. Avoid moving your furniture around. If any of your floors are uneven, fix them. If there are any pets around you, be aware of where they are. Review your medicines with your doctor. Some medicines can make you feel dizzy. This can increase your chance of falling. Ask your doctor what other things that you can do to help prevent falls. This information is not intended to replace advice given to  you by your health care provider. Make sure you discuss any questions you have with your health care provider. Document Released: 05/26/2009 Document Revised: 01/05/2016 Document Reviewed: 09/03/2014 Elsevier Interactive Patient Education  2017 [employer].

## 2022-08-08 ENCOUNTER — Ambulatory Visit (INDEPENDENT_AMBULATORY_CARE_PROVIDER_SITE_OTHER): Payer: Medicare Other

## 2022-08-08 VITALS — Ht 61.0 in | Wt 159.0 lb

## 2022-08-08 DIAGNOSIS — Z0000 Encounter for general adult medical examination without abnormal findings: Secondary | ICD-10-CM

## 2022-08-08 DIAGNOSIS — Z780 Asymptomatic menopausal state: Secondary | ICD-10-CM

## 2022-08-08 DIAGNOSIS — Z13820 Encounter for screening for osteoporosis: Secondary | ICD-10-CM

## 2022-08-08 NOTE — Progress Notes (Signed)
I connected with  Elijah Birk on 08/08/22 by a audio enabled telemedicine application and verified that I am speaking with the correct person using two identifiers.  Patient Location: Home  Provider Location: Home Office  I discussed the limitations of evaluation and management by telemedicine. The patient expressed understanding and agreed to proceed. Subjective:   Megan Khan is a 66 y.o. female who presents for Medicare Annual (Subsequent) preventive examination.  Review of Systems        Objective:    Today's Vitals   08/08/22 1033  Weight: 159 lb (72.1 kg)  Height: _0  (1.549 m)   Body mass index is 30.04 kg/m.     08/08/2022   10:44 AM 12/15/2021   10:27 AM 12/01/2021    3:31 PM 04/05/2021   11:17 AM 01/17/2021    1:34 PM 10/27/2020    1:12 PM 08/18/2020    9:06 AM  Advanced Directives  Does Patient Have a Medical Advance Directive? _1  No No  Would patient like information on creating a medical advance directive? No - Patient declined   Yes (MAU/Ambulatory/Procedural Areas - Information given) No - Patient declined No - Patient declined No - Patient declined    Current Medications (verified) Outpatient Encounter Medications as of 08/08/2022  Medication Sig   amLODipine (NORVASC) 10 MG tablet TAKE 1 TABLET(10 MG) BY MOUTH DAILY   Ascorbic Acid (VITAMIN C) 1000 MG tablet Take 1,000 mg by mouth daily.   bimatoprost (LUMIGAN) 0.01 % SOLN Place 2 drops into the left eye at bedtime.    blood glucose meter kit and supplies KIT Dispense based on patient and insurance preference. Use up to four times daily as directed. (FOR ICD-9 250.00, 250.01).   Blood Glucose Monitoring Suppl (PRODIGY AUTOCODE BLOOD GLUCOSE) w/Device KIT 1 kit by Does not apply route in the morning, at noon, and at bedtime. Any brand acceptable if it works for patient   dorzolamide (TRUSOPT) 2 % ophthalmic solution Place 2 drops into the left eye 3 (three) times daily.     Empagliflozin-metFORMIN HCl ER (SYNJARDY XR) 12.12-998 MG TB24 Take 1 tablet by mouth in the morning and at bedtime.   gabapentin (NEURONTIN) 100 MG capsule TAKE 2 CAPSULES(200 MG) BY MOUTH THREE TIMES DAILY   glipiZIDE (GLUCOTROL) 5 MG tablet Take 1 tablet (5 mg total) by mouth daily.   lisinopril (ZESTRIL) 40 MG tablet TAKE 1 TABLET(40 MG) BY MOUTH DAILY   lovastatin (MEVACOR) 40 MG tablet Take 1 tablet (40 mg total) by mouth at bedtime.   metoprolol succinate (TOPROL-XL) 50 MG 24 hr tablet TAKE 1 TABLET(50 MG) BY MOUTH DAILY   spironolactone (ALDACTONE) 25 MG tablet Take 0.5 tablets (12.5 mg total) by mouth daily.   VITAMIN D PO Take 1 tablet by mouth daily at 6 (six) AM.   No facility-administered encounter medications on file as of 08/08/2022.    Allergies (verified) Sulfamethoxazole   History: Past Medical History:  Diagnosis Date   Anemia    during pregnancy   Breast cancer ([place]) 2020   right breast lumpectomy 05-29-2019   Cataract 2016   RIGHT eye   Diabetes mellitus    Type 2=on meds   Family history of breast cancer    Family history of throat cancer    Glaucoma    bilateral - using eye drops   Hx of colonic polyps    Hyperlipidemia    on meds   Hypertension  on meds   Legally blind    completely blind in left eye, low vision in right   Personal history of chemotherapy 07/2019   stopped after 12 treatments   Personal history of radiation therapy    stopped 12/2019   Past Surgical History:  Procedure Laterality Date   BREAST EXCISIONAL BIOPSY     right breast 05/2019   BREAST LUMPECTOMY     BREAST LUMPECTOMY WITH RADIOACTIVE SEED AND SENTINEL LYMPH NODE BIOPSY Right 05/29/2019   Procedure: RIGHT BREAST LUMPECTOMY WITH RADIOACTIVE SEED AND RIGHT DEEP AXILLARY SENTINEL LYMPH NODE BIOPSY INJECT BLUE DYE;  Surgeon: Fanny Skates, MD;  Location: [facility];  Service: General;  Laterality: Right;   COLONOSCOPY  01/22/2012   Procedure: COLONOSCOPY;  Surgeon: [name], MD;  Location: [facility];  Service: Endoscopy;  Laterality: N/A;   COLONOSCOPY     COLONOSCOPY     HERNIA REPAIR     PORT-A-CATH REMOVAL Right 10/27/2020   Procedure: REMOVAL PORT-A-CATH;  Surgeon: Stark Klein, MD;  Location: [facility];  Service: General;  Laterality: Right;  ROOM 2 STARTING AT 03:00PM FOR 60 MIN   PORTACATH PLACEMENT N/A 05/29/2019   Procedure: INSERTION PORT-A-CATH WITH ULTRASOUND;  Surgeon: Fanny Skates, MD;  Location: [facility];  Service: General;  Laterality: N/A;   RE-EXCISION OF BREAST LUMPECTOMY Right 06/10/2019   Procedure: RE-EXCISION OF RIGHT BREAST LUMPECTOMY MARGINS;  Surgeon: Fanny Skates, MD;  Location: [facility] [facility];  Service: General;  Laterality: Right;   TUBAL LIGATION     Family History  Problem Relation Age of Onset   Diabetes Mother    Hypertension Mother    Diabetes Father    Hypertension Father    Breast cancer Sister 24       Her2 +   Hypertension Paternal Uncle    Hypertension Maternal Grandmother    Diabetes Maternal Grandmother    Hypertension Paternal Grandmother    Diabetes Paternal Grandmother    Breast cancer Cousin        maternal second cousin, Bilateral mastectomy, diagnosed in her 32s   Colon polyps Neg Hx    Colon cancer Neg Hx    Esophageal cancer Neg Hx    Rectal cancer Neg Hx    Stomach cancer Neg Hx    Social History   Socioeconomic History   Marital status: Divorced    Spouse name: Not on file   Number of children: 3   Years of education: 12   Highest education level: 12th grade  Occupational History   Occupation: Disabled  Tobacco Use   Smoking status: Former    Years: 8.00    Types: Cigarettes    Quit date: 09/23/1995    Years since quitting: 26.8   Smokeless tobacco: Never  Vaping Use   Vaping Use: Never used  Substance and Sexual Activity   Alcohol use: No   Drug use: Not Currently    Types: Marijuana   Sexual activity: Not Currently    Birth control/protection: Surgical, Post-menopausal     Comment: tubal Ligations  Other Topics Concern   Not on file  Social History Narrative   Patient lives alone in [place] with her dog Tiny.   Patient is legally blind and on disability.   Patient no longer drives, however has no concerns for transportation at this time.    Patient enjoys spending time with family, singing in her church choir, and sitting outside.    Patient has 2 living children and 1  who died last year (a patient of [facility].)   One son died at age 58 due to gun shot   Social Determinants of Health   Financial Resource Strain: Low Risk  (08/08/2022)   Overall Financial Resource Strain (CARDIA)    Difficulty of Paying Living Expenses: Not hard at all  Food Insecurity: No Food Insecurity (08/08/2022)   Hunger Vital Sign    Worried About Running Out of Food in the Last Year: Never true    Ran Out of Food in the Last Year: Never true  Transportation Needs: No Transportation Needs (04/05/2021)   PRAPARE - Hydrologist (Medical): No    Lack of Transportation (Non-Medical): No  Physical Activity: Insufficiently Active (08/08/2022)   Exercise Vital Sign    Days of Exercise per Week: 3 days    Minutes of Exercise per Session: 30 min  Stress: No Stress Concern Present (08/08/2022)   [place]    Feeling of Stress : Not at all  Social Connections: Moderately Integrated (08/08/2022)   Social Connection and Isolation Panel [NHANES]    Frequency of Communication with Friends and Family: More than three times a week    Frequency of Social Gatherings with Friends and Family: More than three times a week    Attends Religious Services: More than 4 times per year    Active Member of [employer] or Organizations: Yes    Attends Music therapist: More than 4 times per year    Marital Status: Divorced    Tobacco Counseling Counseling given: Not Answered   Clinical  Intake:  Pre-visit preparation completed: Yes  Pain : No/denies pain     BMI - recorded: 30.04 Nutritional Status: BMI > 30  Obese Nutritional Risks: None Diabetes: Yes CBG done?: Yes (161) CBG resulted in Enter/ Edit results?: No  How often do you need to have someone help you when you read instructions, pamphlets, or other written materials from your doctor or pharmacy?: 1 - Never  Diabetic?yes  Interpreter Needed?: No  Information entered by :: S. Cyerra Yim, CMA   Activities of Daily Living    08/08/2022   10:44 AM  In your present state of health, do you have any difficulty performing the following activities:  Hearing? 0  Vision? 1  Difficulty concentrating or making decisions? 0  Walking or climbing stairs? 0  Dressing or bathing? 0  Doing errands, shopping? 0  Preparing Food and eating ? N  Using the Toilet? N  In the past six months, have you accidently leaked urine? N  Do you have problems with loss of bowel control? N  Managing your Medications? N  Managing your Finances? N  Housekeeping or managing your Housekeeping? N    Patient Care Team: Lowry Ram, MD as PCP - General (Family Medicine) [name], [name], MD as PCP - Advanced Heart Failure (Cardiology) [name], [name], MD (Inactive) as Consulting Physician (Oncology) [name], MD as Consulting Physician (Radiation Oncology) Wallene Huh, DPM as Consulting Physician (Podiatry) [name], MD as Consulting Physician (Ophthalmology) [name], MD as Consulting Physician (Gastroenterology) [name], MD as Consulting Physician (Ophthalmology)  Indicate any recent Medical Services you may have received from other than [facility] providers in the past year (date may be approximate).     Assessment:   This is a routine wellness examination for [place].  Hearing/Vision screen Hearing Screening - Comments:: No concerns Vision Screening -  Comments:: Legally blind  Dietary issues and  exercise activities discussed: Current Exercise Habits: The patient does not participate in regular exercise at present   Goals Addressed             This Visit's Progress    Exercise 3x per week (30 min per time)         Depression Screen    08/08/2022   10:38 AM 12/15/2021   10:27 AM 09/13/2021   11:25 AM 04/05/2021   11:14 AM 01/17/2021    1:34 PM 07/18/2020   11:32 AM 04/27/2020   11:16 AM  PHQ 2/9 Scores  PHQ - 2 Score 1 0 0 0 0 0 0  PHQ- 9 Score  0 0  0 0 1    Fall Risk    08/08/2022   10:45 AM 09/13/2021   11:24 AM 04/05/2021   11:17 AM 01/17/2021    1:33 PM 07/18/2020   11:32 AM  [place] in the past year? 0 0 0 0 0  Number falls in past yr:  0  0 0  Injury with Fall?  0  0 0  Risk for fall due to :   Impaired vision    Follow up   Falls prevention discussed      FALL RISK PREVENTION PERTAINING TO THE HOME:  Any stairs in or around the home? Yes  If so, are there any without handrails? Yes  Home free of loose throw rugs in walkways, pet beds, electrical cords, etc? Yes  Adequate lighting in your home to reduce risk of falls? Yes   ASSISTIVE DEVICES UTILIZED TO PREVENT FALLS:  Life alert? No  Use of a cane, walker or w/c? No  Grab bars in the bathroom? No  Shower chair or bench in shower? No  Elevated toilet seat or a handicapped toilet? No   Cognitive Function:        04/05/2021   11:20 AM  6CIT Screen  What Year? 0 points  What month? 0 points  What time? 0 points  Count back from 20 0 points  Months in reverse 0 points  Repeat phrase 0 points  Total Score 0 points    Immunizations Immunization History  Administered Date(s) Administered   Fluad Quad(high Dose 65+) 05/03/2022   Influenza Split 10/03/2011, 05/12/2012   Influenza Whole 05/26/2008, 09/07/2009, 05/17/2010   Influenza,inj,Quad PF,6+ Mos 05/21/2013, 05/13/2015, 08/08/2016, 04/19/2017, 05/22/2018, 06/24/2019, 04/27/2020, 05/12/2021   PFIZER Comirnaty(Gray Top)Covid-19  Tri-Sucrose Vaccine 12/06/2020, 12/06/2020, 04/20/2021   PFIZER(Purple Top)SARS-COV-2 Vaccination 03/12/2020, 04/05/2020   PPD Test 06/20/2012, 10/19/2013   Pfizer Covid-19 Vaccine Bivalent Booster 58yr & up 09/13/2021   Pneumococcal Polysaccharide-23 02/22/2016   Td 01/16/2005   Tdap 02/22/2016   Zoster Recombinat (Shingrix) 01/18/2021, 03/16/2021    TDAP status: Up to date  Flu Vaccine status: Up to date  Pneumococcal vaccine status: Due, Education has been provided regarding the importance of this vaccine. Advised may receive this vaccine at local pharmacy or Health Dept. Aware to provide a copy of the vaccination record if obtained from local pharmacy or Health Dept. Verbalized acceptance and understanding.  Covid-19 vaccine status: Information provided on how to obtain vaccines.   Qualifies for Shingles Vaccine? Yes   Zostavax completed Yes   Shingrix Completed?: Yes  Screening Tests Health Maintenance  Topic Date Due   Diabetic kidney evaluation - Urine ACR  Never done   Pneumonia Vaccine 66 Years old (2 - PCV) 10/26/2021  DEXA SCAN  Never done   COVID-19 Vaccine (7 - 2023-24 season) 08/24/2022 (Originally 04/13/2022)   HEMOGLOBIN A1C  09/15/2022   FOOT EXAM  11/17/2022   PAP SMEAR-Modifier  03/01/2023   Diabetic kidney evaluation - eGFR measurement  03/16/2023   OPHTHALMOLOGY EXAM  07/13/2023   Medicare Annual Wellness (AWV)  08/09/2023   MAMMOGRAM  05/09/2024   DTaP/Tdap/Td (3 - Td or Tdap) 02/21/2026   COLONOSCOPY (Pts 45-15yr Insurance coverage will need to be confirmed)  06/07/2032   INFLUENZA VACCINE  Completed   Hepatitis C Screening  Completed   HIV Screening  Completed   Zoster Vaccines- Shingrix  Completed   HPV VACCINES  Aged Out    Health Maintenance  Health Maintenance Due  Topic Date Due   Diabetic kidney evaluation - Urine ACR  Never done   Pneumonia Vaccine 65 Years old (2 - PCV) 10/26/2021   DEXA SCAN  Never done    Colorectal cancer  screening: Type of screening: Colonoscopy. Completed 06/07/2022. Repeat every 10 years  Mammogram status: Completed 05/09/2022. Repeat every year  Bone Density status: Ordered  . Pt provided with contact info and advised to call to schedule appt.  Lung Cancer Screening: (Low Dose CT Chest recommended if Age 65-80years, 30 pack-year currently smoking OR have quit w/in 15years.) does not qualify.   Lung Cancer Screening Referral: n/a  Additional Screening:  Hepatitis C Screening: does not qualify; Completed 01/30/2016  Vision Screening: Recommended annual ophthalmology exams for early detection of glaucoma and other disorders of the eye. Is the patient up to date with their annual eye exam?  Yes  Who is the provider or what is the name of the office in which the patient attends annual eye exams? Dr WVenetia MaxonIf pt is not established with a provider, would they like to be referred to a provider to establish care? No .   Dental Screening: Recommended annual dental exams for proper oral hygiene  Community Resource Referral / Chronic Care Management: CRR required this visit?  No   CCM required this visit?  No      Plan:     I have personally reviewed and noted the following in the patient's chart:   Medical and social history Use of alcohol, tobacco or illicit drugs  Current medications and supplements including opioid prescriptions. Patient is not currently taking opioid prescriptions. Functional ability and status Nutritional status Physical activity Advanced directives List of other physicians Hospitalizations, surgeries, and ER visits in previous 12 months Vitals Screenings to include cognitive, depression, and falls Referrals and appointments  In addition, I have reviewed and discussed with patient certain preventive protocols, quality metrics, and best practice recommendations. A written personalized care plan for preventive services as well as general preventive health  recommendations were provided to patient.     SAmado Coe CTyro  08/08/2022      I have reviewed this visit and agree with the documentation.  CDorris Singh MD  Family Medicine Teaching Service

## 2022-08-09 ENCOUNTER — Other Ambulatory Visit: Payer: Self-pay

## 2022-08-12 ENCOUNTER — Other Ambulatory Visit: Payer: Self-pay | Admitting: Family Medicine

## 2022-08-12 DIAGNOSIS — G8929 Other chronic pain: Secondary | ICD-10-CM

## 2022-08-17 ENCOUNTER — Other Ambulatory Visit: Payer: Self-pay

## 2022-08-30 ENCOUNTER — Encounter: Payer: Self-pay | Admitting: Oncology

## 2022-08-31 ENCOUNTER — Other Ambulatory Visit: Payer: Self-pay

## 2022-09-06 ENCOUNTER — Other Ambulatory Visit: Payer: Self-pay

## 2022-09-06 ENCOUNTER — Encounter: Payer: Self-pay | Admitting: Family Medicine

## 2022-09-06 ENCOUNTER — Ambulatory Visit (INDEPENDENT_AMBULATORY_CARE_PROVIDER_SITE_OTHER): Payer: 59 | Admitting: Family Medicine

## 2022-09-06 VITALS — BP 123/63 | HR 76 | Ht 61.0 in | Wt 163.4 lb

## 2022-09-06 DIAGNOSIS — G2581 Restless legs syndrome: Secondary | ICD-10-CM | POA: Diagnosis not present

## 2022-09-06 DIAGNOSIS — E1165 Type 2 diabetes mellitus with hyperglycemia: Secondary | ICD-10-CM | POA: Diagnosis not present

## 2022-09-06 LAB — POCT GLYCOSYLATED HEMOGLOBIN (HGB A1C): HbA1c, POC (controlled diabetic range): 9.5 % — AB (ref 0.0–7.0)

## 2022-09-06 MED ORDER — RYBELSUS 3 MG PO TABS: 3.0000 mg | ORAL_TABLET | Freq: Every day | ORAL | 0 refills | Status: DC

## 2022-09-06 NOTE — Assessment & Plan Note (Signed)
Concern for restless legs given crampy pain is only at night and patient has to shake or massage out her legs. No concern for blood loss causing anemia or other symptoms of anemia at this time. However, patient has had chemotherapy treatment in the past; thus, could have some anemia.  - CBC

## 2022-09-06 NOTE — Assessment & Plan Note (Addendum)
A1c increased to 9.5 this visit. Patient has been continuing healthy behaviors; however A1c continues to increase. On highest dose of synjardy at this time. Fasting BG have been in 90s. Would have risk of hypoglycemia with increasing glipizide. Patient has significant vision problems being legally blind causing difficulty with injections.  - Start Rybelsus 4 mg  - F/u in 2 months

## 2022-09-06 NOTE — Progress Notes (Signed)
    SUBJECTIVE:   CHIEF COMPLAINT / HPI:   Diabetes Follow Up  Patient has been very thirsty and drink throughout the night. She says she pees all the time. Patient says she has not kept up with her walking. She says that she exercises at home. She says her sister who used to go the gym with her is not going. She has been exercising.  She says she would be worried about doing a GLP because she cannot see very well and so she would worry about doing shots. She has a system to be able to know what her pills are.   Leg cramping  Patient describes that she has leg cramping at night. This only happens at night and feels like she has to get up and massage out her legs. Denies tingling or radiating pain.   PERTINENT  PMH / PSH: T2DM, HTN   OBJECTIVE:   BP 123/63   Pulse 76   Ht '5\' 1"'$  (1.549 m)   Wt 163 lb 6.4 oz (74.1 kg)   SpO2 99%   BMI 30.87 kg/m   General: well appearing, in no acute distress CV: RRR, radial pulses equal and palpable, no BLE edema  Resp: Normal work of breathing on room air, CTAB Abd: Soft, non tender, non distended  Neuro: Alert & Oriented x 4  MSK: very good mobility and ROM  Foot Exam: No visible lesions, silicone toe spacers in placed, well manicured   ASSESSMENT/PLAN:   Type 2 diabetes mellitus with hyperglycemia (HCC) A1c increased to 9.5 this visit. Patient has been continuing healthy behaviors; however A1c continues to increase. On highest dose of synjardy at this time. Fasting BG have been in 90s. Would have risk of hypoglycemia with increasing glipizide. Patient has significant vision problems being legally blind causing difficulty with injections.  - Start Rybelsus 4 mg  - F/u in 2 months   Restless legs Concern for restless legs given crampy pain is only at night and patient has to shake or massage out her legs. No concern for blood loss causing anemia or other symptoms of anemia at this time. However, patient has had chemotherapy treatment in the  past; thus, could have some anemia.  - CBC     F/u in 2 months   Lowry Ram, MD [place]

## 2022-09-07 LAB — CBC WITH DIFFERENTIAL/PLATELET
Basophils Absolute: 0.1 10*3/uL (ref 0.0–0.2)
Basos: 1 %
EOS (ABSOLUTE): 0.2 10*3/uL (ref 0.0–0.4)
Eos: 3 %
Hematocrit: 44.8 % (ref 34.0–46.6)
Hemoglobin: 14.4 g/dL (ref 11.1–15.9)
Immature Grans (Abs): 0 10*3/uL (ref 0.0–0.1)
Immature Granulocytes: 0 %
Lymphocytes Absolute: 2.1 10*3/uL (ref 0.7–3.1)
Lymphs: 33 %
MCH: 27.5 pg (ref 26.6–33.0)
MCHC: 32.1 g/dL (ref 31.5–35.7)
MCV: 86 fL (ref 79–97)
Monocytes Absolute: 0.5 10*3/uL (ref 0.1–0.9)
Monocytes: 7 %
Neutrophils Absolute: 3.4 10*3/uL (ref 1.4–7.0)
Neutrophils: 56 %
Platelets: 270 10*3/uL (ref 150–450)
RBC: 5.24 x10E6/uL (ref 3.77–5.28)
RDW: 13.4 % (ref 11.7–15.4)
WBC: 6.2 10*3/uL (ref 3.4–10.8)

## 2022-09-21 ENCOUNTER — Encounter: Payer: Self-pay | Admitting: Family Medicine

## 2022-09-28 ENCOUNTER — Other Ambulatory Visit: Payer: Self-pay | Admitting: Family Medicine

## 2022-09-28 DIAGNOSIS — I10 Essential (primary) hypertension: Secondary | ICD-10-CM

## 2022-09-28 DIAGNOSIS — E1165 Type 2 diabetes mellitus with hyperglycemia: Secondary | ICD-10-CM

## 2022-10-11 ENCOUNTER — Other Ambulatory Visit: Payer: Self-pay | Admitting: Family Medicine

## 2022-10-11 DIAGNOSIS — G8929 Other chronic pain: Secondary | ICD-10-CM

## 2022-11-01 ENCOUNTER — Encounter: Payer: Self-pay | Admitting: Family Medicine

## 2022-11-01 ENCOUNTER — Other Ambulatory Visit: Payer: Self-pay

## 2022-11-01 ENCOUNTER — Ambulatory Visit (INDEPENDENT_AMBULATORY_CARE_PROVIDER_SITE_OTHER): Payer: 59 | Admitting: Family Medicine

## 2022-11-01 VITALS — BP 129/79 | HR 71 | Ht 61.0 in | Wt 169.8 lb

## 2022-11-01 DIAGNOSIS — E1165 Type 2 diabetes mellitus with hyperglycemia: Secondary | ICD-10-CM

## 2022-11-01 DIAGNOSIS — Z0000 Encounter for general adult medical examination without abnormal findings: Secondary | ICD-10-CM | POA: Diagnosis not present

## 2022-11-01 NOTE — Patient Instructions (Signed)
It was wonderful to see you today.  Please bring ALL of your medications with you to every visit.   Today we talked about:  Diabetes - Please me a glucose log for the next 30 days. I highly recommend a CGM but we can talk about it at your next visit. If you any lows let me know and we will stop the glipizide.   Blood pressure: You're doing great!   Please follow up in 1 month.   Thank you for choosing [facility].   Please call [phone] with any questions about today's appointment.  Lowry Ram, MD  Family Medicine

## 2022-11-01 NOTE — Progress Notes (Signed)
    SUBJECTIVE:   CHIEF COMPLAINT / HPI:   Annual Physical  Patient has caught up on her mammogram and colonoscopy. Her last pap was normal and now is over 65. She does not have any specific concerns today. She is happy that she has been trying to eat healthier and exercise and she has lost some weight intentionally.   Diabetes  Patient reports she has had some low glucose episodes. She said the lowest glucose she checked was 84 and she drank some juice. Patient is blind and does glucose sticks herself. She takes glipizide 5 mg. She is very consistent with her medications. She is very apprehensive of the CGM and does not want a device.   PERTINENT  PMH / PSH: HTN, T2DM, HLD, Obesity, H/o breast cancer, legal blindness d/t glaumcoma  OBJECTIVE:   BP 129/79   Pulse 71   Ht 5\' 1"  (1.549 m)   Wt 169 lb 12.8 oz (77 kg)   SpO2 100%   BMI 32.08 kg/m   General: well appearing, in no acute distress CV: RRR, radial pulses equal and palpable, no BLE edema  Resp: Normal work of breathing on room air, CTAB Abd: Soft, non tender, non distended  Neuro: Alert & Oriented x 4    ASSESSMENT/PLAN:   Type 2 diabetes mellitus with hyperglycemia (HCC) Patient has had some lows and has risk of hurting herself for checking her blood sugars. Would be a good candidate for CGM. Given that she has some low glucose values according to patient would be in favor of discontinuing glipizide for risk of hypoglycemia.  - Patient declines CGM  - Patient will make 30 day log of her glucose values and will bring it to follow up in 1 month.    Health Maintenance  - PCV vaccine - Dexa scan scheduled in June  - [place], MD [place]

## 2022-11-02 ENCOUNTER — Encounter: Payer: Self-pay | Admitting: Oncology

## 2022-11-02 NOTE — Assessment & Plan Note (Signed)
Patient has had some lows and has risk of hurting herself for checking her blood sugars. Would be a good candidate for CGM. Given that she has some low glucose values according to patient would be in favor of discontinuing glipizide for risk of hypoglycemia.  - Patient declines CGM  - Patient will make 30 day log of her glucose values and will bring it to follow up in 1 month.

## 2022-11-04 ENCOUNTER — Encounter: Payer: Self-pay | Admitting: Oncology

## 2022-11-04 LAB — MICROALBUMIN / CREATININE URINE RATIO
Creatinine, Urine: 119.6 mg/dL
Microalb/Creat Ratio: 4 mg/g creat (ref 0–29)
Microalbumin, Urine: 4.4 ug/mL

## 2022-11-04 LAB — COMPREHENSIVE METABOLIC PANEL
ALT: 16 IU/L (ref 0–32)
AST: 12 IU/L (ref 0–40)
Albumin/Globulin Ratio: 1.8 (ref 1.2–2.2)
Albumin: 4.4 g/dL (ref 3.9–4.9)
Alkaline Phosphatase: 94 IU/L (ref 44–121)
BUN/Creatinine Ratio: 17 (ref 12–28)
BUN: 16 mg/dL (ref 8–27)
Bilirubin Total: 0.2 mg/dL (ref 0.0–1.2)
CO2: 22 mmol/L (ref 20–29)
Calcium: 10 mg/dL (ref 8.7–10.3)
Chloride: 102 mmol/L (ref 96–106)
Creatinine, Ser: 0.92 mg/dL (ref 0.57–1.00)
Globulin, Total: 2.5 g/dL (ref 1.5–4.5)
Glucose: 142 mg/dL — ABNORMAL HIGH (ref 70–99)
Potassium: 4.2 mmol/L (ref 3.5–5.2)
Sodium: 141 mmol/L (ref 134–144)
Total Protein: 6.9 g/dL (ref 6.0–8.5)
eGFR: 69 mL/min/{1.73_m2} (ref >59)

## 2022-11-04 LAB — LIPID PANEL
Chol/HDL Ratio: 2.9 ratio (ref 0.0–4.4)
Cholesterol, Total: 150 mg/dL (ref 100–199)
HDL: 51 mg/dL (ref >39)
LDL Chol Calc (NIH): 74 mg/dL (ref 0–99)
Triglycerides: 143 mg/dL (ref 0–149)
VLDL Cholesterol Cal: 25 mg/dL (ref 5–40)

## 2022-11-15 ENCOUNTER — Telehealth: Payer: Self-pay

## 2022-11-15 NOTE — Telephone Encounter (Signed)
Patient calls nurse line regarding order for diabetic supplies. Patient receives supplies through [facility].   Called [facility] for order form. They are faxing form to our office.   For insurance purposes, this will need to be signed by attending.   Talbot Grumbling, RN

## 2022-11-21 NOTE — Telephone Encounter (Signed)
Patient is calling back again to check on status of the form being completed and faxed back to [facility]. She states they have not received for yet. It has now been longer than the 5 day turn around time.   Please call patient when it has been completed.

## 2022-12-07 NOTE — Progress Notes (Unsigned)
    SUBJECTIVE:   CHIEF COMPLAINT / HPI:   Diabetes Reports working on her diet and trying not to eat past 7PM. Taking Gabapentin 300mg  BID but reports continued neuropathic pain. Current Regimen: Synjardy 12.12-998 BID, Glipizide 5mg  daily CBGs: Provided log of TID BGL testing. Fast sugar in low 100s. Afternoon and PM BGL range from 110-440s (very large range), mostly in 200s Last A1c:  Lab Results  Component Value Date   HGBA1C 9.6 (A) 12/10/2022    Denies polyuria, polydipsia, hypoglycemia. Last Eye Exam: UTD Statin: Lovastatin 40mg , taking daily ACE/ARB: Lisinopril 40mg   PERTINENT  PMH / PSH: T2DM  OBJECTIVE:   BP 124/72   Pulse 75   Ht 5\' 1"  (1.549 m)   Wt 157 lb (71.2 kg)   SpO2 96%   BMI 29.66 kg/m    General: NAD, pleasant, able to participate in exam. Respiratory: Normal WOB on RA Extremities: No edema or cyanosis. Skin: Warm and dry, no rashes noted Neuro: Alert, no obvious focal deficits Psych: Normal affect and mood  ASSESSMENT/PLAN:   Type 2 diabetes mellitus with hyperglycemia (HCC) A1c 9.6, stable from prior (9.5 in 08/2022). Taking Synjardy 12.12-998 BID (max dose) and Glipizide 5mg . BGL log shows controlled fasting BGL (low 100s) but large variability in afternoon and bedtime BGL (mostly 200s, up to 440s). No recorded hypoglycemia. Declines CGM. -Increase to Glipizide 10mg  daily. Declined interested in any injections and GLP-1s due to h/o thyroid cancer in family. Limited options since on max dose Metformin and SGLT2. -Advised continuing BGL log (AM and bedtime readings) since declines interest in CGM  Neuropathy Persistent peripheral neuropathy and calf cramping since receiving chemo x few years. Some relief on Gabapentin 200mg  TID but patient taking 300mg  BID. -Switch to Gabapentin 300mg  BID as patient is already taking this way and reduce pill burden -BMP, Mag, B12 for further work up   Dr. Elberta Fortis, DO [facility]

## 2022-12-10 ENCOUNTER — Ambulatory Visit (INDEPENDENT_AMBULATORY_CARE_PROVIDER_SITE_OTHER): Payer: 59 | Admitting: Family Medicine

## 2022-12-10 ENCOUNTER — Encounter: Payer: Self-pay | Admitting: Family Medicine

## 2022-12-10 VITALS — BP 124/72 | HR 75 | Ht 61.0 in | Wt 157.0 lb

## 2022-12-10 DIAGNOSIS — G629 Polyneuropathy, unspecified: Secondary | ICD-10-CM | POA: Diagnosis not present

## 2022-12-10 DIAGNOSIS — M25511 Pain in right shoulder: Secondary | ICD-10-CM | POA: Diagnosis not present

## 2022-12-10 DIAGNOSIS — G8929 Other chronic pain: Secondary | ICD-10-CM

## 2022-12-10 DIAGNOSIS — E1165 Type 2 diabetes mellitus with hyperglycemia: Secondary | ICD-10-CM | POA: Diagnosis not present

## 2022-12-10 DIAGNOSIS — G6289 Other specified polyneuropathies: Secondary | ICD-10-CM | POA: Diagnosis not present

## 2022-12-10 LAB — POCT GLYCOSYLATED HEMOGLOBIN (HGB A1C): HbA1c, POC (controlled diabetic range): 9.6 % — AB (ref 0.0–7.0)

## 2022-12-10 MED ORDER — GLIPIZIDE 10 MG PO TABS: 10.0000 mg | ORAL_TABLET | Freq: Every day | ORAL | 0 refills | Status: DC

## 2022-12-10 MED ORDER — GABAPENTIN 300 MG PO CAPS: 300.0000 mg | ORAL_CAPSULE | Freq: Two times a day (BID) | ORAL | 1 refills | Status: DC

## 2022-12-10 NOTE — Assessment & Plan Note (Addendum)
A1c 9.6, stable from prior (9.5 in 08/2022). Taking Synjardy 12.12-998 BID (max dose) and Glipizide 5mg . BGL log shows controlled fasting BGL (low 100s) but large variability in afternoon and bedtime BGL (mostly 200s, up to 440s). No recorded hypoglycemia. Declines CGM. -Increase to Glipizide 10mg  daily. Declined interested in any injections and GLP-1s due to h/o thyroid cancer in family. Limited options since on max dose Metformin and SGLT2. -Advised continuing BGL log (AM and bedtime readings) since declines interest in CGM

## 2022-12-10 NOTE — Patient Instructions (Addendum)
It was wonderful to see you today! Thank you for choosing [facility].   Please bring ALL of your medications with you to every visit.   Today we talked about:  We are increasing you to Glipizide 10mg  daily. Please take two tablets at the same time of your remaining 5mg  and then take the 10mg  tablet I sent to the pharmacy. Please continue to keep a log of your blood sugars every day as you have been doing. We adjusted your gabapentin to 300mg  twice per day. It is the same dose just only 1 pill twice per day now. We are checking some labs to look at other reasons you may have cramping in your legs. I will follow up about those results. For nutrition management: Call Dr. Gerilyn Pilgrim (our nutritionist) to set up an appointment. Her phone number is: [phone].   Please follow up in 3 months for diabetes management  If you haven't already, sign up for My Chart to have easy access to your labs results, and communication with your primary care physician.   We are checking some labs today. If they are abnormal, I will call you. If they are normal, I will send you a MyChart message (if it is active) or a letter in the mail. If you do not hear about your labs in the next 2 weeks, please call the office.  Call the clinic at [phone] if your symptoms worsen or you have any concerns.  Please be sure to schedule follow up at the front desk before you leave today.   Elberta Fortis, DO Family Medicine

## 2022-12-10 NOTE — Assessment & Plan Note (Signed)
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 Some relief on Gabapentin 200mg  TID but patient taking 300mg  BID. -Switch to Gabapentin 300mg  BID as patient is already taking this way and reduce pill burden -BMP, Mag, B12 for further work up

## 2022-12-11 ENCOUNTER — Telehealth: Payer: Self-pay | Admitting: Family Medicine

## 2022-12-11 ENCOUNTER — Encounter: Payer: Self-pay | Admitting: Oncology

## 2022-12-11 LAB — BASIC METABOLIC PANEL
BUN/Creatinine Ratio: 14 (ref 12–28)
BUN: 13 mg/dL (ref 8–27)
CO2: 19 mmol/L — ABNORMAL LOW (ref 20–29)
Calcium: 9.9 mg/dL (ref 8.7–10.3)
Chloride: 100 mmol/L (ref 96–106)
Creatinine, Ser: 0.93 mg/dL (ref 0.57–1.00)
Glucose: 183 mg/dL — ABNORMAL HIGH (ref 70–99)
Potassium: 4.5 mmol/L (ref 3.5–5.2)
Sodium: 138 mmol/L (ref 134–144)
eGFR: 68 mL/min/{1.73_m2} (ref >59)

## 2022-12-11 LAB — MAGNESIUM: Magnesium: 2.2 mg/dL (ref 1.6–2.3)

## 2022-12-11 LAB — VITAMIN B12: Vitamin B-12: 683 pg/mL (ref 232–1245)

## 2022-12-11 NOTE — Telephone Encounter (Signed)
Spoke with patient about lab results. Patient legally blind and does not have MyChart to review results. Normal BMP, Mag and B12 levels. Reassured patient her neuropathy will likely get better as her A1c decreases. Continue to use Gabapentin 300mg  BID.  Elberta Fortis, DO

## 2022-12-12 ENCOUNTER — Other Ambulatory Visit: Payer: Self-pay | Admitting: Family Medicine

## 2022-12-12 DIAGNOSIS — G8929 Other chronic pain: Secondary | ICD-10-CM

## 2022-12-19 ENCOUNTER — Other Ambulatory Visit: Payer: Self-pay | Admitting: Hematology and Oncology

## 2022-12-27 ENCOUNTER — Other Ambulatory Visit: Payer: Self-pay | Admitting: Family Medicine

## 2022-12-27 DIAGNOSIS — I10 Essential (primary) hypertension: Secondary | ICD-10-CM

## 2022-12-27 DIAGNOSIS — E1165 Type 2 diabetes mellitus with hyperglycemia: Secondary | ICD-10-CM

## 2023-01-01 ENCOUNTER — Telehealth: Payer: Self-pay

## 2023-01-01 NOTE — Telephone Encounter (Signed)
Patient calls nurse line due to issues with form for glucometer supplies.   Called [employer]. They are needing paperwork to be completed with diagnosis code and completed part B section. Advised that this was completed on last forms. They are unsure if these were received.   They are requesting that attending initial diagnosis and sign and date again at bottom of form.   This form has been placed in Dr. Theora Gianotti box for completion.   Fax cover sheet attached. This can be faxed once signed.   Veronda Prude, RN

## 2023-01-02 NOTE — Telephone Encounter (Signed)
Form completed and placed in To fax pile Terisa Starr, MD  [facility] Medicine Teaching Service

## 2023-01-08 NOTE — Telephone Encounter (Signed)
Received fax from [facility] for prior authorization.   Called and spoke with representative to complete prior auth. Answered all questions.   We will receive a fax with determination within a few business days.   Reference number: [id number]  Veronda Prude, RN

## 2023-01-10 NOTE — Telephone Encounter (Signed)
Prior Auth for patients medication [employer] approved by [employer] from 01/08/23 to 08/13/23.

## 2023-01-16 ENCOUNTER — Other Ambulatory Visit: Payer: Self-pay | Admitting: Family Medicine

## 2023-01-16 DIAGNOSIS — I10 Essential (primary) hypertension: Secondary | ICD-10-CM

## 2023-02-05 ENCOUNTER — Other Ambulatory Visit: Payer: Self-pay | Admitting: Family Medicine

## 2023-02-05 DIAGNOSIS — E1169 Type 2 diabetes mellitus with other specified complication: Secondary | ICD-10-CM

## 2023-02-07 ENCOUNTER — Ambulatory Visit
Admission: RE | Admit: 2023-02-07 | Discharge: 2023-02-07 | Disposition: A | Discharge status: Home / Self Care | Payer: 59 | Source: Ambulatory Visit | Attending: Family Medicine | Admitting: Family Medicine

## 2023-02-07 DIAGNOSIS — Z0000 Encounter for general adult medical examination without abnormal findings: Secondary | ICD-10-CM

## 2023-02-07 DIAGNOSIS — Z13820 Encounter for screening for osteoporosis: Secondary | ICD-10-CM

## 2023-03-14 ENCOUNTER — Other Ambulatory Visit: Payer: Self-pay | Admitting: Family Medicine

## 2023-03-14 DIAGNOSIS — I10 Essential (primary) hypertension: Secondary | ICD-10-CM

## 2023-03-14 DIAGNOSIS — E1165 Type 2 diabetes mellitus with hyperglycemia: Secondary | ICD-10-CM

## 2023-03-16 ENCOUNTER — Other Ambulatory Visit: Payer: Self-pay | Admitting: Family Medicine

## 2023-03-16 DIAGNOSIS — E1165 Type 2 diabetes mellitus with hyperglycemia: Secondary | ICD-10-CM

## 2023-03-19 NOTE — Telephone Encounter (Signed)
Patient LVM on nurse line requesting refill on medication. Request received by pharmacy and sent to provider.   Veronda Prude, RN

## 2023-03-20 ENCOUNTER — Other Ambulatory Visit: Payer: Self-pay | Admitting: *Deleted

## 2023-03-20 DIAGNOSIS — E1165 Type 2 diabetes mellitus with hyperglycemia: Secondary | ICD-10-CM

## 2023-04-13 ENCOUNTER — Other Ambulatory Visit: Payer: Self-pay | Admitting: Family Medicine

## 2023-04-13 DIAGNOSIS — I10 Essential (primary) hypertension: Secondary | ICD-10-CM

## 2023-04-16 ENCOUNTER — Other Ambulatory Visit: Payer: Self-pay | Admitting: *Deleted

## 2023-04-16 ENCOUNTER — Other Ambulatory Visit: Payer: Self-pay | Admitting: Family Medicine

## 2023-04-16 DIAGNOSIS — Z1231 Encounter for screening mammogram for malignant neoplasm of breast: Secondary | ICD-10-CM

## 2023-04-16 DIAGNOSIS — Z8639 Personal history of other endocrine, nutritional and metabolic disease: Secondary | ICD-10-CM

## 2023-04-18 MED ORDER — SPIRONOLACTONE 25 MG PO TABS: 12.5000 mg | ORAL_TABLET | Freq: Every day | ORAL | 3 refills | Status: DC

## 2023-04-25 NOTE — Patient Instructions (Addendum)
Ms. Abdo , Thank you for taking time to come for your Medicare Wellness Visit. I appreciate your ongoing commitment to your health goals. Please review the following plan we discussed and let me know if I can assist you in the future.   Referrals/Orders/Follow-Ups/Clinician Recommendations: Aim for 30 minutes of exercise or brisk walking, 6-8 glasses of water, and 5 servings of fruits and vegetables each day.  This is a list of the screening recommended for you and due dates:  Health Maintenance  Topic Date Due   Complete foot exam   11/17/2022   COVID-19 Vaccine (7 - 2023-24 season) 04/14/2023   Hemoglobin A1C  06/11/2023   Eye exam for diabetics  07/13/2023   Yearly kidney health urinalysis for diabetes  11/01/2023   Yearly kidney function blood test for diabetes  12/10/2023   Medicare Annual Wellness Visit  04/25/2024   Mammogram  05/09/2024   DTaP/Tdap/Td vaccine (3 - Td or Tdap) 02/21/2026   Colon Cancer Screening  06/07/2032   Pneumonia Vaccine  Completed   Flu Shot  Completed   DEXA scan (bone density measurement)  Completed   Hepatitis C Screening  Completed   Zoster (Shingles) Vaccine  Completed   HPV Vaccine  Aged Out    Advanced directives: (ACP Link)Information on Advanced Care Planning can be found at [facility] of [facility] Advance Health Care Directives Advance Health Care Directives ([email or web address]   Next Medicare Annual Wellness Visit scheduled for next year: Yes

## 2023-04-26 ENCOUNTER — Ambulatory Visit (INDEPENDENT_AMBULATORY_CARE_PROVIDER_SITE_OTHER): Payer: 59

## 2023-04-26 VITALS — Ht 61.0 in | Wt 157.0 lb

## 2023-04-26 DIAGNOSIS — Z0000 Encounter for general adult medical examination without abnormal findings: Secondary | ICD-10-CM

## 2023-04-26 NOTE — Progress Notes (Signed)
Subjective:   Megan Khan is a 67 y.o. female who presents for Medicare Annual (Subsequent) preventive examination.  Visit Complete: Virtual  I connected with  Otilio Saber on 04/26/23 by a audio enabled telemedicine application and verified that I am speaking with the correct person using two identifiers.  Patient Location: Home  Provider Location: Home Office  I discussed the limitations of evaluation and management by telemedicine. The patient expressed understanding and agreed to proceed.  Vital Signs: Because this visit was a virtual/telehealth visit, some criteria may be missing or patient reported. Any vitals not documented were not able to be obtained and vitals that have been documented are patient reported.   Cardiac Risk Factors include: advanced age (>59men, >50 women);diabetes mellitus;hypertension;dyslipidemia     Objective:    Today's Vitals   04/26/23 0936  Weight: 157 lb (71.2 kg)  Height: 5\' 1"  (1.549 m)   Body mass index is 29.66 kg/m.     04/26/2023    9:44 AM 12/10/2022   11:32 AM 11/01/2022   12:02 PM 09/06/2022   10:46 AM 08/08/2022   10:44 AM 12/15/2021   10:27 AM 12/01/2021    3:31 PM  Advanced Directives  Does Patient Have a Medical Advance Directive? No No No No No No No  Would patient like information on creating a medical advance directive? Yes (MAU/Ambulatory/Procedural Areas - Information given) No - Patient declined No - Patient declined No - Patient declined No - Patient declined      Current Medications (verified) Outpatient Encounter Medications as of 04/26/2023  Medication Sig   amLODipine (NORVASC) 10 MG tablet TAKE 1 TABLET(10 MG) BY MOUTH DAILY   blood glucose meter kit and supplies KIT Dispense based on patient and insurance preference. Use up to four times daily as directed. (FOR ICD-9 250.00, 250.01).   Blood Glucose Monitoring Suppl (PRODIGY AUTOCODE BLOOD GLUCOSE) w/Device KIT 1 kit by Does not apply route in the  morning, at noon, and at bedtime. Any brand acceptable if it works for patient   Empagliflozin-metFORMIN HCl ER (SYNJARDY XR) 12.12-998 MG TB24 Take 1 tablet by mouth in the morning and at bedtime.   gabapentin (NEURONTIN) 300 MG capsule TAKE 1 CAPSULE(300 MG) BY MOUTH TWICE DAILY   glipiZIDE (GLUCOTROL) 10 MG tablet Take 1 tablet (10 mg total) by mouth daily.   lisinopril (ZESTRIL) 40 MG tablet TAKE 1 TABLET(40 MG) BY MOUTH DAILY   lovastatin (MEVACOR) 40 MG tablet TAKE 1 TABLET(40 MG) BY MOUTH AT BEDTIME   metoprolol succinate (TOPROL-XL) 50 MG 24 hr tablet TAKE 1 TABLET(50 MG) BY MOUTH DAILY   spironolactone (ALDACTONE) 25 MG tablet Take 0.5 tablets (12.5 mg total) by mouth daily.   timolol (TIMOPTIC) 0.5 % ophthalmic solution Apply to eye.   No facility-administered encounter medications on file as of 04/26/2023.    Allergies (verified) Sulfur and Sulfamethoxazole   History: Past Medical History:  Diagnosis Date   Anemia    during pregnancy   Breast cancer (HCC) 2020   right breast lumpectomy 05-29-2019   Cataract 2016   RIGHT eye   Diabetes mellitus    Type 2=on meds   Family history of breast cancer    Family history of throat cancer    Glaucoma    bilateral - using eye drops   Hx of colonic polyps    Hyperlipidemia    on meds   Hypertension    on meds   Legally blind    completely  blind in left eye, low vision in right   Personal history of chemotherapy 07/2019   stopped after 12 treatments   Personal history of radiation therapy    stopped 12/2019   Past Surgical History:  Procedure Laterality Date   BREAST EXCISIONAL BIOPSY     right breast 05/2019   BREAST LUMPECTOMY     BREAST LUMPECTOMY WITH RADIOACTIVE SEED AND SENTINEL LYMPH NODE BIOPSY Right 05/29/2019   Procedure: RIGHT BREAST LUMPECTOMY WITH RADIOACTIVE SEED AND RIGHT DEEP AXILLARY SENTINEL LYMPH NODE BIOPSY INJECT BLUE DYE;  Surgeon: Claud Kelp, MD;  Location[facility];  Service: General;   Laterality: Right;   COLONOSCOPY  01/22/2012   Procedure: COLONOSCOPY;  Surgeon: Graylin Shiver, MD;  Location: [facility] END[facility]COPY;  Service: Endoscopy;  Laterality: N/A;   COLONOSCOPY     COLONOSCOPY     HERNIA REPAIR     PORT-A-CATH REMOVAL Right 10/27/2020   Procedure: REMOVAL PORT-A-CATH;  Surgeon: Almond Lint, MD;  Location: [facility] OR;  Service: General;  Laterality: Right;  ROOM 2 STARTING AT 03:00PM FOR 60 MIN   PORTACATH PLACEMENT N/A 05/29/2019   Procedure: INSERTION PORT-A-CATH WITH ULTRASOUND;  Surgeon: Claud Kelp, MD;  Location: [facility] OR;  Service: General;  Laterality: N/A;   RE-EXCISION OF BREAST LUMPECTOMY Right 06/10/2019   Procedure: RE-EXCISION OF RIGHT BREAST LUMPECTOMY MARGINS;  Surgeon: Claud Kelp, MD;  Location: [facility] OR;  Service: General;  Laterality: Right;   TUBAL LIGATION     Family History  Problem Relation Age of Onset   Diabetes Mother    Hypertension Mother    Diabetes Father    Hypertension Father    Breast cancer Sister 53       Her2 +   Hypertension Paternal Uncle    Hypertension Maternal Grandmother    Diabetes Maternal Grandmother    Hypertension Paternal Grandmother    Diabetes Paternal Grandmother    Breast cancer Cousin        maternal second cousin, Bilateral mastectomy, diagnosed in her 1s   Colon polyps Neg Hx    Colon cancer Neg Hx    Esophageal cancer Neg Hx    Rectal cancer Neg Hx    Stomach cancer Neg Hx    Social History   Socioeconomic History   Marital status: Divorced    Spouse name: Not on file   Number of children: 3   Years of education: 12   Highest education level: 12th grade  Occupational History   Occupation: Disabled  Tobacco Use   Smoking status: Former    Current packs/day: 0.00    Types: Cigarettes    Start date: 09/23/1987    Quit date: 09/23/1995    Years since quitting: 27.6   Smokeless tobacco: Never  Vaping Use   Vaping status: Never Used  Substance and Sexual Activity   Alcohol use: No   Drug use: Not  Currently    Types: Marijuana   Sexual activity: Not Currently    Birth control/protection: Surgical, Post-menopausal    Comment: tubal Ligations  Other Topics Concern   Not on file  Social History Narrative   Patient lives alone in [place] with her dog Tiny.   Patient is legally blind and on disability.   Patient no longer drives, however has no concerns for transportation at this time.    Patient enjoys spending time with family, singing in her church choir, and sitting outside.    Patient has 2 living children and 1 who died last  year (a patient of [facility].)   One son died at age 62 due to gun shot   Social Determinants of Health   Financial Resource Strain: Low Risk  (04/26/2023)   Overall Financial Resource Strain (CARDIA)    Difficulty of Paying Living Expenses: Not hard at all  Food Insecurity: No Food Insecurity (04/26/2023)   Hunger Vital Sign    Worried About Running Out of Food in the Last Year: Never true    Ran Out of Food in the Last Year: Never true  Transportation Needs: No Transportation Needs (04/26/2023)   PRAPARE - Administrator, Civil Service (Medical): No    Lack of Transportation (Non-Medical): No  Physical Activity: Insufficiently Active (04/26/2023)   Exercise Vital Sign    Days of Exercise per Week: 3 days    Minutes of Exercise per Session: 30 min  Stress: No Stress Concern Present (04/26/2023)   [employer] of Occupational Health - Occupational Stress Questionnaire    Feeling of Stress : Not at all  Social Connections: Moderately Integrated (04/26/2023)   Social Connection and Isolation Panel [NHANES]    Frequency of Communication with Friends and Family: More than three times a week    Frequency of Social Gatherings with Friends and Family: Three times a week    Attends Religious Services: More than 4 times per year    Active Member of Clubs or Organizations: Yes    Attends Banker Meetings: 1 to 4 times per year     Marital Status: Divorced    Tobacco Counseling Counseling given: Not Answered   Clinical Intake:                        Activities of Daily Living    04/26/2023    9:43 AM 08/08/2022   10:44 AM  In your present state of health, do you have any difficulty performing the following activities:  Hearing? 0 0  Vision? 0 1  Difficulty concentrating or making decisions? 0 0  Walking or climbing stairs? 0 0  Dressing or bathing? 0 0  Doing errands, shopping? 0 0  Preparing Food and eating ? N N  Using the Toilet? N N  In the past six months, have you accidently leaked urine? N N  Do you have problems with loss of bowel control? N N  Managing your Medications? N N  Managing your Finances? N N  Housekeeping or managing your Housekeeping? N N    Patient Care Team: Lockie Mola, MD as PCP - General (Family Medicine) [name], Bevelyn Buckles, MD as PCP - Advanced Heart Failure (Cardiology) [name], Valentino Hue, MD (Inactive) as Consulting Physician (Oncology) Dorothy Puffer, MD as Consulting Physician (Radiation Oncology) [name], Kirstie Peri, DPM as Consulting Physician (Podiatry) Chalmers Guest, MD as Consulting Physician (Ophthalmology) Graylin Shiver, MD as Consulting Physician (Gastroenterology) Chalmers Guest, MD as Consulting Physician (Ophthalmology)  Indicate any recent Medical Services you may have received from other than [facility] providers in the past year (date may be approximate).     Assessment:   This is a routine wellness examination for [place].  Hearing/Vision screen Hearing Screening - Comments:: Denies hearing difficulties   Vision Screening - Comments:: Wears rx glasses - up to date with routine eye exams with Dr., Harlon Flor (legally blind)     Goals Addressed             This Visit's Progress    COMPLETED: Exercise 3x  per week (30 min per time)       Remain active and independent        Depression Screen    04/26/2023    9:42 AM 12/10/2022   11:40  AM 11/01/2022   12:02 PM 09/06/2022   10:45 AM 08/08/2022   10:38 AM 12/15/2021   10:27 AM 09/13/2021   11:25 AM  PHQ 2/9 Scores  PHQ - 2 Score 0 0 0 0 1 0 0  PHQ- 9 Score  0 0 0  0 0    Fall Risk    04/26/2023    9:43 AM 12/10/2022   11:33 AM 11/01/2022   12:02 PM 09/06/2022   10:45 AM 08/08/2022   10:45 AM  Fall Risk   Falls in the past year? 0 0 0 0 0  Number falls in past yr: 0  0 0   Injury with Fall? 0  0 0   Risk for fall due to : No Fall Risks      Follow up Falls prevention discussed;Education provided;Falls evaluation completed        MEDICARE RISK AT HOME: Medicare Risk at Home Any stairs in or around the home?: No If so, are there any without handrails?: No Home free of loose throw rugs in walkways, pet beds, electrical cords, etc?: Yes Adequate lighting in your home to reduce risk of falls?: Yes Life alert?: No Use of a cane, walker or w/c?: No Grab bars in the bathroom?: Yes Shower chair or bench in shower?: No Elevated toilet seat or a handicapped toilet?: Yes  TIMED UP AND GO:  Was the test performed?  No    Cognitive Function:        04/26/2023    9:44 AM 04/05/2021   11:20 AM  6CIT Screen  What Year? 0 points 0 points  What month? 0 points 0 points  What time? 0 points 0 points  Count back from 20 0 points 0 points  Months in reverse 0 points 0 points  Repeat phrase 0 points 0 points  Total Score 0 points 0 points    Immunizations Immunization History  Administered Date(s) Administered   Fluad Quad(high Dose 65+) 05/03/2022   Influenza Split 10/03/2011, 05/12/2012   Influenza Whole 05/26/2008, 09/07/2009, 05/17/2010   Influenza, High Dose Seasonal PF 04/22/2023   Influenza,inj,Quad PF,6+ Mos 05/21/2013, 05/13/2015, 08/08/2016, 04/19/2017, 05/22/2018, 06/24/2019, 04/27/2020, 05/12/2021   PFIZER Comirnaty(Gray Top)Covid-19 Tri-Sucrose Vaccine 12/06/2020, 12/06/2020, 04/20/2021   PFIZER(Purple Top)SARS-COV-2 Vaccination 03/12/2020, 04/05/2020    PNEUMOCOCCAL CONJUGATE-20 11/01/2022   PPD Test 06/20/2012, 10/19/2013   Pfizer Covid-19 Vaccine Bivalent Booster 47yrs & up 09/13/2021   Pneumococcal Polysaccharide-23 02/22/2016   Td 01/16/2005   Tdap 02/22/2016   Zoster Recombinant(Shingrix) 01/18/2021, 03/16/2021    TDAP status: Up to date  Flu Vaccine status: Up to date  Pneumococcal vaccine status: Up to date  Covid-19 vaccine status: Completed vaccines  Qualifies for Shingles Vaccine? Yes   Zostavax completed No   Shingrix Completed?: Yes  Screening Tests Health Maintenance  Topic Date Due   FOOT EXAM  11/17/2022   COVID-19 Vaccine (7 - 2023-24 season) 04/14/2023   HEMOGLOBIN A1C  06/11/2023   OPHTHALMOLOGY EXAM  07/13/2023   Diabetic kidney evaluation - Urine ACR  11/01/2023   Diabetic kidney evaluation - eGFR measurement  12/10/2023   Medicare Annual Wellness (AWV)  04/25/2024   MAMMOGRAM  05/09/2024   DTaP/Tdap/Td (3 - Td or Tdap) 02/21/2026   Colonoscopy  06/07/2032  Pneumonia Vaccine 4+ Years old  Completed   INFLUENZA VACCINE  Completed   DEXA SCAN  Completed   Hepatitis C Screening  Completed   Zoster Vaccines- Shingrix  Completed   HPV VACCINES  Aged Out    Health Maintenance  Health Maintenance Due  Topic Date Due   FOOT EXAM  11/17/2022   COVID-19 Vaccine (7 - 2023-24 season) 04/14/2023    Colorectal cancer screening: Type of screening: Colonoscopy. Completed 06/07/22. Repeat every 10 years  Mammogram status: Ordered and scheduled for 05/14/23. Pt provided with contact info and advised to call to schedule appt.   Bone Density status: Completed 02/07/23. Results reflect: Bone density results: NORMAL. Repeat every 5 years.  Lung Cancer Screening: (Low Dose CT Chest recommended if Age 34-80 years, 20 pack-year currently smoking OR have quit w/in 15years.) does not qualify.   Lung Cancer Screening Referral: n/a  Additional Screening:  Hepatitis C Screening: does qualify; Completed  01/30/16  Vision Screening: Recommended annual ophthalmology exams for early detection of glaucoma and other disorders of the eye. Is the patient up to date with their annual eye exam?  Yes  Who is the provider or what is the name of the office in which the patient attends annual eye exams? Dr. Harlon Flor If pt is not established with a provider, would they like to be referred to a provider to establish care? No .   Dental Screening: Recommended annual dental exams for proper oral hygiene  Diabetic Foot Exam: Diabetic Foot Exam: Overdue, Pt has been advised about the importance in completing this exam. Pt is scheduled for diabetic foot exam on at next office visit.  Community Resource Referral / Chronic Care Management: CRR required this visit?  No   CCM required this visit?  No     Plan:     I have personally reviewed and noted the following in the patient's chart:   Medical and social history Use of alcohol, tobacco or illicit drugs  Current medications and supplements including opioid prescriptions. Patient is not currently taking opioid prescriptions. Functional ability and status Nutritional status Physical activity Advanced directives List of other physicians Hospitalizations, surgeries, and ER visits in previous 12 months Vitals Screenings to include cognitive, depression, and falls Referrals and appointments  In addition, I have reviewed and discussed with patient certain preventive protocols, quality metrics, and best practice recommendations. A written personalized care plan for preventive services as well as general preventive health recommendations were provided to patient.     Kandis Fantasia [place], [place]   1/61/0960   After Visit Summary: (Mail) Due to this being a telephonic visit, the after visit summary with patients personalized plan was offered to patient via mail   Nurse Notes: No concerns at this time

## 2023-05-02 ENCOUNTER — Other Ambulatory Visit: Payer: Self-pay

## 2023-05-02 DIAGNOSIS — E1165 Type 2 diabetes mellitus with hyperglycemia: Secondary | ICD-10-CM

## 2023-05-05 MED ORDER — SYNJARDY XR 12.5-1000 MG PO TB24
1.0000 | ORAL_TABLET | Freq: Two times a day (BID) | ORAL | 3 refills | Status: DC
Start: 2023-05-05 — End: 2024-04-07

## 2023-05-14 ENCOUNTER — Ambulatory Visit: Payer: 59

## 2023-05-22 ENCOUNTER — Other Ambulatory Visit: Payer: Self-pay | Admitting: Family Medicine

## 2023-05-22 DIAGNOSIS — Z853 Personal history of malignant neoplasm of breast: Secondary | ICD-10-CM

## 2023-06-06 ENCOUNTER — Ambulatory Visit
Admission: RE | Admit: 2023-06-06 | Discharge: 2023-06-06 | Disposition: A | Discharge status: Home / Self Care | Payer: 59 | Source: Ambulatory Visit | Attending: Internal Medicine | Admitting: Internal Medicine

## 2023-06-06 DIAGNOSIS — Z853 Personal history of malignant neoplasm of breast: Secondary | ICD-10-CM

## 2023-06-12 ENCOUNTER — Other Ambulatory Visit: Payer: Self-pay | Admitting: Family Medicine

## 2023-06-12 DIAGNOSIS — I10 Essential (primary) hypertension: Secondary | ICD-10-CM

## 2023-06-12 DIAGNOSIS — E1165 Type 2 diabetes mellitus with hyperglycemia: Secondary | ICD-10-CM

## 2023-07-11 ENCOUNTER — Other Ambulatory Visit: Payer: Self-pay | Admitting: Family Medicine

## 2023-07-11 DIAGNOSIS — I10 Essential (primary) hypertension: Secondary | ICD-10-CM

## 2023-07-13 ENCOUNTER — Other Ambulatory Visit: Payer: Self-pay | Admitting: Family Medicine

## 2023-07-13 DIAGNOSIS — I10 Essential (primary) hypertension: Secondary | ICD-10-CM

## 2023-07-15 ENCOUNTER — Other Ambulatory Visit: Payer: Self-pay

## 2023-07-15 DIAGNOSIS — I10 Essential (primary) hypertension: Secondary | ICD-10-CM

## 2023-07-15 MED ORDER — AMLODIPINE BESYLATE 10 MG PO TABS
ORAL_TABLET | ORAL | 2 refills | Status: DC
Start: 2023-07-15 — End: 2023-10-08

## 2023-09-10 ENCOUNTER — Other Ambulatory Visit: Payer: Self-pay | Admitting: Family Medicine

## 2023-09-10 DIAGNOSIS — E1165 Type 2 diabetes mellitus with hyperglycemia: Secondary | ICD-10-CM

## 2023-09-15 ENCOUNTER — Other Ambulatory Visit: Payer: Self-pay | Admitting: Family Medicine

## 2023-09-15 ENCOUNTER — Ambulatory Visit (INDEPENDENT_AMBULATORY_CARE_PROVIDER_SITE_OTHER): Payer: 59

## 2023-09-15 ENCOUNTER — Ambulatory Visit
Admission: RE | Admit: 2023-09-15 | Discharge: 2023-09-15 | Disposition: A | Discharge status: Home / Self Care | Payer: 59 | Source: Ambulatory Visit

## 2023-09-15 VITALS — BP 124/78 | HR 66 | Temp 98.2°F | Resp 18 | Ht 61.0 in | Wt 157.0 lb

## 2023-09-15 DIAGNOSIS — J40 Bronchitis, not specified as acute or chronic: Secondary | ICD-10-CM | POA: Diagnosis not present

## 2023-09-15 DIAGNOSIS — R051 Acute cough: Secondary | ICD-10-CM | POA: Diagnosis not present

## 2023-09-15 DIAGNOSIS — J329 Chronic sinusitis, unspecified: Secondary | ICD-10-CM

## 2023-09-15 DIAGNOSIS — R059 Cough, unspecified: Secondary | ICD-10-CM | POA: Diagnosis not present

## 2023-09-15 DIAGNOSIS — E1165 Type 2 diabetes mellitus with hyperglycemia: Secondary | ICD-10-CM

## 2023-09-15 DIAGNOSIS — I10 Essential (primary) hypertension: Secondary | ICD-10-CM

## 2023-09-15 MED ORDER — BENZONATATE 100 MG PO CAPS: 100.0000 mg | ORAL_CAPSULE | Freq: Three times a day (TID) | ORAL | 0 refills | Status: DC

## 2023-09-15 MED ORDER — AMOXICILLIN-POT CLAVULANATE 875-125 MG PO TABS: 1.0000 | ORAL_TABLET | Freq: Two times a day (BID) | ORAL | 0 refills | Status: DC

## 2023-09-15 NOTE — Discharge Instructions (Signed)
Your chest x-ray was normal with no evidence of pneumonia.  We are going to cover for sinus/bronchitis infection with Augmentin.  Take this twice a day for 7 days.  Gargle with warm salt water.  Continue over-the-counter medication such as Mucinex, Flonase, Tylenol.  Use Tessalon for cough.  If your symptoms not improving within a week or if anything worsens you should be seen immediately.

## 2023-09-15 NOTE — ED Provider Notes (Signed)
[name]    CSN: [id number] Arrival date & time: 09/15/23  1237      History   Chief Complaint Chief Complaint  Patient presents with   Cough    Family of 2   Xray Request    HPI Megan Khan is a 68 y.o. female.   Patient presents today with a 10-day history of URI symptoms.  Reports that she was exposed to influenza by a family member.  She was taking over-the-counter medication but continues to have significant cough prompting evaluation.  She also reports some sore throat and congestion.  She has been taking Tylenol without improvement of symptoms.  She denies any history of allergies, asthma, COPD, smoking.  She denies any recent antibiotics or steroids.  She is eating and drinking normally.  She is concerned because her cough has become more productive over the past several days and she wants to ensure that it is not turning into pneumonia.    Past Medical History:  Diagnosis Date   Anemia    during pregnancy   Breast cancer (HCC) 2020   right breast lumpectomy 05-29-2019   Cataract 2016   RIGHT eye   Diabetes mellitus    Type 2=on meds   Family history of breast cancer    Family history of throat cancer    Glaucoma    bilateral - using eye drops   Hx of colonic polyps    Hyperlipidemia    on meds   Hypertension    on meds   Legally blind    completely blind in left eye, low vision in right   Personal history of chemotherapy 07/2019   stopped after 12 treatments   Personal history of radiation therapy    stopped 12/2019    Patient Active Problem List   Diagnosis Date Noted   Neuropathy 12/10/2022   Restless legs 09/06/2022   Legal blindness 03/15/2022   Constipation 03/15/2022   Strain of muscle, fascia and tendon of lower back, initial encounter 12/01/2021   Neutropenia (HCC) 10/20/2019   Port-A-Cath in place 08/27/2019   Family history of throat cancer    Family history of breast cancer    magrinatb 03/30/2019   Malignant  neoplasm of upper-outer quadrant of right breast in female, estrogen receptor negative (HCC) 03/19/2019   Osteoarthritis of both shoulders 07/18/2017   Lumbar radiculopathy 07/18/2017   Bunion of great toe of right foot 07/18/2017   Umbilical hernia 02/10/2015   Primary open angle glaucoma of right eye, severe stage 12/20/2013   Primary open angle glaucoma of left eye, severe stage 12/14/2013   CATARACT, HX OF 06/28/2010   Hyperlipidemia 01/26/2009   OBESITY, UNSPECIFIED 11/22/2008   Type 2 diabetes mellitus with hyperglycemia (HCC) 06/23/2008   Open-angle glaucoma of both eyes, severe stage 05/26/2008   SYSTOLIC MURMUR 04/17/2007   HYPERTENSION, BENIGN SYSTEMIC 10/10/2006    Past Surgical History:  Procedure Laterality Date   BREAST EXCISIONAL BIOPSY     right breast 05/2019   BREAST LUMPECTOMY     BREAST LUMPECTOMY WITH RADIOACTIVE SEED AND SENTINEL LYMPH NODE BIOPSY Right 05/29/2019   Procedure: RIGHT BREAST LUMPECTOMY WITH RADIOACTIVE SEED AND RIGHT DEEP AXILLARY SENTINEL LYMPH NODE BIOPSY INJECT BLUE DYE;  Surgeon: Claud Kelp, MD;  Location: [facility] OR;  Service: General;  Laterality: Right;   COLONOSCOPY  01/22/2012   Procedure: COLONOSCOPY;  Surgeon: Graylin Shiver, MD;  Location: [facility];  Service: Endoscopy;  Laterality: N/A;   COLONOSCOPY  COLONOSCOPY     HERNIA REPAIR     PORT-A-CATH REMOVAL Right 10/27/2020   Procedure: REMOVAL PORT-A-CATH;  Surgeon: Almond Lint, MD;  Location: [facility] OR;  Service: General;  Laterality: Right;  ROOM 2 STARTING AT 03:00PM FOR 60 MIN   PORTACATH PLACEMENT N/A 05/29/2019   Procedure: INSERTION PORT-A-CATH WITH ULTRASOUND;  Surgeon: Claud Kelp, MD;  Location: [facility] OR;  Service: General;  Laterality: N/A;   RE-EXCISION OF BREAST LUMPECTOMY Right 06/10/2019   Procedure: RE-EXCISION OF RIGHT BREAST LUMPECTOMY MARGINS;  Surgeon: Claud Kelp, MD;  Location: [facility] OR;  Service: General;  Laterality: Right;   TUBAL LIGATION      OB  History     Gravida  3   Para  3   Term  3   Preterm      AB      Living         SAB      IAB      Ectopic      Multiple      Live Births               Home Medications    Prior to Admission medications   Medication Sig Start Date End Date Taking? Authorizing Provider  amLODipine (NORVASC) 10 MG tablet TAKE 1 TABLET(10 MG) BY MOUTH DAILY 07/15/23  Yes Lockie Mola, MD  amoxicillin-clavulanate (AUGMENTIN) 875-125 MG tablet Take 1 tablet by mouth every 12 (twelve) hours. 09/15/23  Yes Kendarius Vigen K, PA-C  benzonatate (TESSALON) 100 MG capsule Take 1 capsule (100 mg total) by mouth every 8 (eight) hours. 09/15/23  Yes Jakobe Blau, Noberto Retort, PA-C  EMBRACE BLOOD GLUCOSE TEST test strip  09/03/23  Yes [provider]  Empagliflozin-metFORMIN HCl ER (SYNJARDY XR) 12.12-998 MG TB24 Take 1 tablet by mouth in the morning and at bedtime. 05/05/23  Yes Lockie Mola, MD  gabapentin (NEURONTIN) 300 MG capsule Take 1 capsule (300 mg total) by mouth 3 (three) times daily. NEEDS APPOINTMENT PRIOR TO REFILL 09/10/23  Yes Elberta Fortis, MD  glipiZIDE (GLUCOTROL) 10 MG tablet Take 1 tablet (10 mg total) by mouth daily. 03/14/23  Yes Lockie Mola, MD  ibuprofen (ADVIL) 800 MG tablet Take 800 mg by mouth every 6 (six) hours as needed. 04/17/23  Yes [provider]  lisinopril (ZESTRIL) 40 MG tablet TAKE 1 TABLET(40 MG) BY MOUTH DAILY 06/12/23  Yes Lockie Mola, MD  metoprolol succinate (TOPROL-XL) 50 MG 24 hr tablet TAKE 1 TABLET(50 MG) BY MOUTH DAILY 06/12/23  Yes Lockie Mola, MD  spironolactone (ALDACTONE) 25 MG tablet Take 0.5 tablets (12.5 mg total) by mouth daily. 04/18/23  Yes Lockie Mola, MD  Alcohol Swabs (PHARMACIST CHOICE ALCOHOL) PADS  09/03/23   [provider]  Blood Glucose Calibration (EMBRACE CONTROL) Low SOLN  09/03/23   [provider]  blood glucose meter kit and supplies KIT Dispense based on patient and insurance preference. Use up to  four times daily as directed. (FOR ICD-9 250.00, 250.01). 09/29/15   Delynn Flavin M, DO  Blood Glucose Monitoring Suppl [facility] BLOOD GLUCOSE MONITOR) DEVI  09/03/23   [provider]  Blood Glucose Monitoring Suppl (PRODIGY AUTOCODE BLOOD GLUCOSE) w/Device KIT 1 kit by Does not apply route in the morning, at noon, and at bedtime. Any brand acceptable if it works for patient 01/04/20   Marthenia Rolling, DO  chlorhexidine (PERIDEX) 0.12 % solution RINSE MOUTH WITH 15 ML 1 CAPSUL FOR 30 SECONDS AM AND PM AFTER TOOTHBRUSHING 04/17/23  [provider]  Lancet Devices (SIMPLE DIAGNOSTICS LANCING DEV) MISC Apply topically. 09/03/23   [provider]  lovastatin (MEVACOR) 40 MG tablet TAKE 1 TABLET(40 MG) BY MOUTH AT BEDTIME 02/10/23   Lockie Mola, MD  Pharmacist Choice Lancets MISC  09/03/23   [provider]  timolol (TIMOPTIC) 0.5 % ophthalmic solution Apply to eye. 03/24/14   [provider]    Family History Family History  Problem Relation Age of Onset   Diabetes Mother    Hypertension Mother    Diabetes Father    Hypertension Father    Breast cancer Sister 12       Her2 +   Hypertension Paternal Uncle    Hypertension Maternal Grandmother    Diabetes Maternal Grandmother    Hypertension Paternal Grandmother    Diabetes Paternal Grandmother    Breast cancer Cousin        maternal second cousin, Bilateral mastectomy, diagnosed in her 17s   Colon polyps Neg Hx    Colon cancer Neg Hx    Esophageal cancer Neg Hx    Rectal cancer Neg Hx    Stomach cancer Neg Hx     Social History Social History   Tobacco Use   Smoking status: Former    Current packs/day: 0.00    Types: Cigarettes    Start date: 09/23/1987    Quit date: 09/23/1995    Years since quitting: 27.9    Passive exposure: Never   Smokeless tobacco: Never  Vaping Use   Vaping status: Never Used  Substance Use Topics   Alcohol use: No   Drug use: Not Currently    Types:  Marijuana     Allergies   Sulfur and Sulfamethoxazole   Review of Systems Review of Systems  Constitutional:  Positive for activity change. Negative for appetite change, fatigue and fever.  HENT:  Positive for congestion and sore throat. Negative for sinus pressure and sneezing.   Respiratory:  Positive for cough. Negative for shortness of breath.   Cardiovascular:  Negative for chest pain.  Gastrointestinal:  Negative for abdominal pain, diarrhea, nausea and vomiting.  Musculoskeletal:  Negative for arthralgias and myalgias.  Neurological:  Negative for dizziness, light-headedness and headaches.     Physical Exam Triage Vital Signs ED Triage Vitals  Encounter Vitals Group     BP 09/15/23 1316 124/78     Systolic BP Percentile --      Diastolic BP Percentile --      Pulse Rate 09/15/23 1316 66     Resp 09/15/23 1316 18     Temp 09/15/23 1316 98.2 F (36.8 C)     Temp Source 09/15/23 1316 Oral     SpO2 09/15/23 1316 96 %     Weight 09/15/23 1313 156 lb 15.5 oz (71.2 kg)     Height 09/15/23 1313 5\' 1"  (1.549 m)     Head Circumference --      Peak Flow --      Pain Score 09/15/23 1312 0     Pain Loc --      Pain Education --      Exclude from Growth Chart --    No data found.  Updated Vital Signs BP 124/78 (BP Location: Right Arm)   Pulse 66   Temp 98.2 F (36.8 C) (Oral)   Resp 18   Ht 5\' 1"  (1.549 m)   Wt 156 lb 15.5 oz (71.2 kg)   SpO2 96%   BMI 29.66  kg/m   Visual Acuity Right Eye Distance:   Left Eye Distance:   Bilateral Distance:    Right Eye Near:   Left Eye Near:    Bilateral Near:     Physical Exam Vitals reviewed.  Constitutional:      General: She is awake. She is not in acute distress.    Appearance: Normal appearance. She is well-developed. She is not ill-appearing.     Comments: Very pleasant female appears stated age in no acute distress sitting comfortably in exam room  HENT:     Head: Normocephalic and atraumatic.     Right Ear:  Tympanic membrane, ear canal and external ear normal. Tympanic membrane is not erythematous or bulging.     Left Ear: Tympanic membrane, ear canal and external ear normal. Tympanic membrane is not erythematous or bulging.     Nose:     Right Sinus: No maxillary sinus tenderness or frontal sinus tenderness.     Left Sinus: No maxillary sinus tenderness or frontal sinus tenderness.     Mouth/Throat:     Pharynx: Uvula midline. Postnasal drip present. No oropharyngeal exudate or posterior oropharyngeal erythema.  Cardiovascular:     Rate and Rhythm: Normal rate and regular rhythm.     Heart sounds: Normal heart sounds, S1 normal and S2 normal. No murmur heard. Pulmonary:     Effort: Pulmonary effort is normal.     Breath sounds: Examination of the right-lower field reveals decreased breath sounds. Examination of the left-lower field reveals decreased breath sounds. Decreased breath sounds present. No wheezing, rhonchi or rales.  Psychiatric:        Behavior: Behavior is cooperative.      UC Treatments / Results  Labs (all labs ordered are listed, but only abnormal results are displayed) Labs Reviewed - No data to display  EKG   Radiology DG Chest 2 View Result Date: 09/15/2023 CLINICAL DATA:  Cough. EXAM: CHEST - 2 VIEW COMPARISON:  October 17, 2019. FINDINGS: The heart size and mediastinal contours are within normal limits. Both lungs are clear. The visualized skeletal structures are unremarkable. IMPRESSION: No active cardiopulmonary disease. Electronically Signed   By: Lupita Raider M.D.   On: 09/15/2023 14:18    Procedures Procedures (including critical care time)  Medications Ordered in UC Medications - No data to display  Initial Impression / Assessment and Plan / UC Course  I have reviewed the triage vital signs and the nursing notes.  Pertinent labs & imaging results that were available during my care of the patient were reviewed by me and considered in my medical decision  making (see chart for details).     Patient is well-appearing, afebrile, nontoxic, nontachycardic.  No indication for viral testing as she has been symptomatic for over a week and this would not change management.  Chest x-ray was obtained that showed no evidence of acute cardiopulmonary disease.  Given her prolonged and worsening symptoms will cover for secondary bacterial infection with Augmentin twice daily for 7 days.  No indication for dose adjustment based on metabolic panel from 12/10/2022 with creatinine of 0.93 and calculated creatinine clearance of 66.47 mL/min.  She was encouraged to use over-the-counter medication for symptom management such as Mucinex, Flonase, Tylenol.  She was given Tessalon for cough.  Discussed that if her symptoms not improving or anything worsens she needs to be seen immediately.  Strict return precautions given.  Final Clinical Impressions(s) / UC Diagnoses   Final diagnoses:  Acute  cough  Sinobronchitis     Discharge Instructions      Your chest x-ray was normal with no evidence of pneumonia.  We are going to cover for sinus/bronchitis infection with Augmentin.  Take this twice a day for 7 days.  Gargle with warm salt water.  Continue over-the-counter medication such as Mucinex, Flonase, Tylenol.  Use Tessalon for cough.  If your symptoms not improving within a week or if anything worsens you should be seen immediately.     ED Prescriptions     Medication Sig Dispense Auth. Provider   amoxicillin-clavulanate (AUGMENTIN) 875-125 MG tablet Take 1 tablet by mouth every 12 (twelve) hours. 14 tablet Loralie Malta K, PA-C   benzonatate (TESSALON) 100 MG capsule Take 1 capsule (100 mg total) by mouth every 8 (eight) hours. 21 capsule Kenyona Rena K, PA-C      PDMP not reviewed this encounter.   Jeani Hawking, PA-C 09/15/23 1432

## 2023-09-15 NOTE — ED Triage Notes (Signed)
"  I caught the Flu (exposed to [place]) who has had the Flu recently, My cough is lingering and I need a Chest Xray". No fever.

## 2023-09-16 ENCOUNTER — Other Ambulatory Visit: Payer: Self-pay | Admitting: Family Medicine

## 2023-09-16 DIAGNOSIS — E1165 Type 2 diabetes mellitus with hyperglycemia: Secondary | ICD-10-CM

## 2023-09-16 DIAGNOSIS — I10 Essential (primary) hypertension: Secondary | ICD-10-CM

## 2023-10-08 ENCOUNTER — Other Ambulatory Visit: Payer: Self-pay | Admitting: Family Medicine

## 2023-10-08 DIAGNOSIS — I10 Essential (primary) hypertension: Secondary | ICD-10-CM

## 2023-10-11 ENCOUNTER — Other Ambulatory Visit: Payer: Self-pay | Admitting: Family Medicine

## 2023-10-11 DIAGNOSIS — E1165 Type 2 diabetes mellitus with hyperglycemia: Secondary | ICD-10-CM

## 2023-10-11 DIAGNOSIS — I10 Essential (primary) hypertension: Secondary | ICD-10-CM

## 2023-10-14 ENCOUNTER — Encounter: Payer: Self-pay | Admitting: Family Medicine

## 2023-10-14 ENCOUNTER — Ambulatory Visit (INDEPENDENT_AMBULATORY_CARE_PROVIDER_SITE_OTHER): Payer: 59 | Admitting: Family Medicine

## 2023-10-14 VITALS — BP 120/80 | HR 60 | Ht 61.0 in | Wt 157.1 lb

## 2023-10-14 DIAGNOSIS — Z23 Encounter for immunization: Secondary | ICD-10-CM

## 2023-10-14 DIAGNOSIS — E1165 Type 2 diabetes mellitus with hyperglycemia: Secondary | ICD-10-CM | POA: Diagnosis not present

## 2023-10-14 DIAGNOSIS — H548 Legal blindness, as defined in USA: Secondary | ICD-10-CM

## 2023-10-14 DIAGNOSIS — G629 Polyneuropathy, unspecified: Secondary | ICD-10-CM

## 2023-10-14 LAB — POCT GLYCOSYLATED HEMOGLOBIN (HGB A1C): HbA1c, POC (controlled diabetic range): 9.3 % — AB (ref 0.0–7.0)

## 2023-10-14 NOTE — Assessment & Plan Note (Signed)
 Follows with optho. Will obtain release of records.
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
## 2023-10-14 NOTE — Assessment & Plan Note (Signed)
 A1C  remained elevated, down to 9.3% from 9.6%. Patient reports this is due to eating habits with recent travel. Would like to trial diet lifestyle changes for 3 months prior to consideration of other medications or medication adjustment. - continue Synjardy 12.12-998 BID - continue glipizide 10 mg daily - 3 month follow up  - Urine P/Cr ordered

## 2023-10-14 NOTE — Assessment & Plan Note (Signed)
 Believes she may have some neuropathy vs cramping in bilateral leg that is worse with cold. Is taking gabapentin now which she reports does help some. Has hx of potassium issues that causes cramping. - ordered BMP - continue gabapentin

## 2023-10-14 NOTE — Patient Instructions (Signed)
 It was great to see you today! Thank you for choosing [facility] for your primary care. Megan Khan was seen for annual visit.  Today we addressed: Diabetes- lets plan a three month follow up. If your A1C is still elevated at this visit, we will discuss adjustments to medication Cramps - I ordered a BMP for you today to check on your potassium levels.   If you haven't already, sign up for My Chart to have easy access to your labs results, and communication with your primary care physician.  We are checking some labs today. If they are abnormal, I will call you. If they are normal, I will send you a MyChart message (if it is active) or a letter in the mail. If you do not hear about your labs in the next 2 weeks, please call the office.   You should return to our clinic Return in about 3 months (around 01/14/2024).  I recommend that you always bring your medications to each appointment as this makes it easy to ensure you are on the correct medications and helps [place] not miss refills when you need them.  Please arrive 15 minutes before your appointment to ensure smooth check in process.  We appreciate your efforts in making this happen.  Please call the clinic at [phone] if your symptoms worsen or you have any concerns.  Thank you for allowing me to participate in your care, Hal Morales, MD 10/14/2023, 10:58 AM PGY-1, [facility]

## 2023-10-15 LAB — BASIC METABOLIC PANEL
BUN/Creatinine Ratio: 10 — ABNORMAL LOW (ref 12–28)
BUN: 8 mg/dL (ref 8–27)
CO2: 23 mmol/L (ref 20–29)
Calcium: 9.6 mg/dL (ref 8.7–10.3)
Chloride: 102 mmol/L (ref 96–106)
Creatinine, Ser: 0.79 mg/dL (ref 0.57–1.00)
Glucose: 124 mg/dL — ABNORMAL HIGH (ref 70–99)
Potassium: 4.2 mmol/L (ref 3.5–5.2)
Sodium: 143 mmol/L (ref 134–144)
eGFR: 82 mL/min/{1.73_m2} (ref >59)

## 2023-10-16 LAB — MICROALBUMIN / CREATININE URINE RATIO
Creatinine, Urine: 62.8 mg/dL
Microalb/Creat Ratio: <5 mg/g{creat} (ref 0–29)
Microalbumin, Urine: <3 ug/mL

## 2023-10-17 ENCOUNTER — Telehealth: Payer: Self-pay | Admitting: Family Medicine

## 2023-10-17 NOTE — Telephone Encounter (Signed)
Called patient to discuss lab results. All questions answered.

## 2023-10-28 ENCOUNTER — Telehealth: Payer: Self-pay | Admitting: Pharmacist

## 2023-10-28 NOTE — Telephone Encounter (Signed)
 Attempted to contact patient for follow-up of diabetes control and change since visit 10/14/2023   Left HIPAA compliant voice mail requesting call back to direct phone: [phone]  Total time with patient call and documentation of interaction: 4 minutes.

## 2023-10-29 ENCOUNTER — Telehealth: Payer: Self-pay | Admitting: Pharmacist

## 2023-10-29 NOTE — Telephone Encounter (Signed)
 Patient contacted for follow-up of Glucose control and potential liberate enrollment.   Since last contact patient reports improved control.  She admits not taking Rybelsus - fear of Thyroid cancer family history. Reports taking Synjardy and Glipizide  Willing to consider CGM study  Agreed to appointment 4/7  Total time with patient call and documentation of interaction: 12 minutes.

## 2023-10-30 NOTE — Telephone Encounter (Signed)
 Reviewed and agree with Dr Macky Lower plan.

## 2023-11-10 ENCOUNTER — Other Ambulatory Visit: Payer: Self-pay | Admitting: Student

## 2023-11-10 DIAGNOSIS — E1165 Type 2 diabetes mellitus with hyperglycemia: Secondary | ICD-10-CM

## 2023-11-11 DIAGNOSIS — H541224 Low vision right eye category 2, blindness left eye category 4: Secondary | ICD-10-CM | POA: Diagnosis not present

## 2023-11-11 DIAGNOSIS — H401133 Primary open-angle glaucoma, bilateral, severe stage: Secondary | ICD-10-CM | POA: Diagnosis not present

## 2023-11-11 DIAGNOSIS — H5712 Ocular pain, left eye: Secondary | ICD-10-CM | POA: Diagnosis not present

## 2023-11-11 DIAGNOSIS — H04123 Dry eye syndrome of bilateral lacrimal glands: Secondary | ICD-10-CM | POA: Diagnosis not present

## 2023-11-12 NOTE — Telephone Encounter (Signed)
 Patient calling to check the status of this refill request. Forwarding to covering doc

## 2023-11-18 ENCOUNTER — Ambulatory Visit (INDEPENDENT_AMBULATORY_CARE_PROVIDER_SITE_OTHER): Admitting: Pharmacist

## 2023-11-18 ENCOUNTER — Encounter: Payer: Self-pay | Admitting: Pharmacist

## 2023-11-18 VITALS — BP 118/81 | HR 69 | Wt 157.0 lb

## 2023-11-18 DIAGNOSIS — E1165 Type 2 diabetes mellitus with hyperglycemia: Secondary | ICD-10-CM

## 2023-11-18 NOTE — Progress Notes (Signed)
 Reviewed and agree with Dr Macky Lower plan.

## 2023-11-18 NOTE — Assessment & Plan Note (Signed)
 Diabetes longstanding currently uncontrolled on multiple drug therapy.  Patient taking 3 oral agents without reaching goal and is reluctant to use injection therapy.  Patient is able to verbalize appropriate hypoglycemia management plan. Medication adherence appears to be highly adherent. Control is suboptimal due to current lack of diet control and lack of exercise.  At last visit, patient agreed to make efforts to improve glucose control with Dr. Georg Ruddle.  -Extensively discussed pathophysiology of diabetes, recommended lifestyle interventions, dietary effects on blood sugar control.  -Patient agreeable to lifestyle changes before considering adding on Trulicity (dulaglutide) for better glucose control. Reviewed this injection technique and also asked patient to investigate if Medullary thyroid was diagnosed in any family member. Medication information given to patient, she will research and follow-up at next visit.  She stated that she would be able to inject Trulicity.

## 2023-11-18 NOTE — Patient Instructions (Addendum)
 It was nice to see you today!  Make sure to call and schedule a follow-up visit with Dr. Georg Ruddle for the first week of June.  Your goal blood sugar is 80-130 before eating and less than 180 after eating.  Medication Changes: Look into Trulicity (dulaglutide):   Dulaglutide causes thyroid C-cell tumors in rats. It is unknown  whether TRULICITY causes thyroid C-cell tumors, including  medullary thyroid carcinoma (MTC), in humans as the human  relevance of dulaglutide-induced rodent thyroid C-cell tumors has not been determined.  TRULICITY is contraindicated in patients with a personal or  family history of MTC and in patients with Multiple Endocrine  Neoplasia syndrome type 2 (MEN 2). Counsel patients  regarding the potential risk of MTC and symptoms of thyroid  tumors.  Continue all other medication the same.   Monitor blood sugars at home and keep a log (glucometer or piece of paper) to bring with you to your next visit.  Keep up the good work with diet and exercise. Aim for a diet full of vegetables, fruit and lean meats (chicken, [place], fish). Try to limit salt intake by eating fresh or frozen vegetables (instead of canned), rinse canned vegetables prior to cooking and do not add any additional salt to meals.

## 2023-11-18 NOTE — Progress Notes (Signed)
 S:     Chief Complaint  Patient presents with   Medication Management    Diabetes control - potential [employer] study enrollment (not candidate due to phone and lack of interest)   68 y.o. female who presents for diabetes evaluation, education, and management. Patient arrives in good spirits and presents without any assistance.   Patient was referred and last seen by Primary Care Provider, Dr. Georg Ruddle, on 10/14/23.   PMH is significant for T2DM, hyperlipidemia, hypertension, legal blindness, glaucoma of both eyes.  At last PCP visit, patient discussed 90-day plan for better diabetes management.  She reports focused efforts to improve diet and exercise plan.   Current diabetes medications include: Synjardy XR (empagliflozin-Metformin) BID, glipizide 10mg  daily Current hypertension medications include: amlodipine, lisinopril, metoprolol succinate, and spironolactone. Current hyperlipidemia medications include: lovastatin  Patient reports adherence to taking all medications as prescribed.   Do you feel that your medications are working for you? yes Have you been experiencing any side effects to the medications prescribed? no Do you have any problems obtaining medications due to transportation or finances? no Insurance coverage: [employer]/Medicare  Patient denies hypoglycemic events.  Reported home fasting blood sugars: 90s -110s  Reported 2 hour post-meal/random blood sugars: 200s.  Patient reports nocturia (nighttime urination).  Patient denies neuropathy (nerve pain). Patient reports visual changes. Patient denies self foot exams.   Patient reported dietary habits: Eats 1-2 meals/day Usually eats a large meal at the end of the day. Snacks: Potato chips Drinks: Lots of water, occasional [employer]  Recently cut down on carb consumption including: bread and rice  Within the past 12 months, did you worry whether your food would run out before you got money to buy  more? no Within the past 12 months, did the food you bought run out, and you didn't have money to get more? no   Patient-reported exercise habits: Will be starting water aerobics three times a week   O:   ROS  Physical Exam Constitutional:      Appearance: Normal appearance.  Pulmonary:     Effort: Pulmonary effort is normal.  Neurological:     Mental Status: She is alert.  Psychiatric:        Mood and Affect: Mood normal.        Behavior: Behavior normal.        Thought Content: Thought content normal.        Judgment: Judgment normal.    Lab Results  Component Value Date   HGBA1C 9.3 (A) 10/14/2023   Vitals:   11/18/23 1020  BP: 118/81  Pulse: 69  SpO2: 98%   Lipid Panel     Component Value Date/Time   CHOL 150 11/01/2022 1702   TRIG 143 11/01/2022 1702   HDL 51 11/01/2022 1702   CHOLHDL 2.9 11/01/2022 1702   CHOLHDL 2.8 01/30/2016 1130   VLDL 22 01/30/2016 1130   LDLCALC 74 11/01/2022 1702   LDLDIRECT 125 (H) 03/25/2014 1459    Clinical Atherosclerotic Cardiovascular Disease (ASCVD): Yes  The 10-year ASCVD risk score (Arnett DK, et al., 2019) is: 15.2%   Values used to calculate the score:     Age: 44 years     Sex: Female     Is Non-Hispanic African American: Yes     Diabetic: Yes     Tobacco smoker: No     Systolic Blood Pressure: 118 mmHg     Is BP treated: Yes     HDL  Cholesterol: 51 mg/dL     Total Cholesterol: 150 mg/dL    A/P: Diabetes longstanding currently uncontrolled on multiple drug therapy.  Patient taking 3 oral agents without reaching goal and is reluctant to use injection therapy.  Patient is able to verbalize appropriate hypoglycemia management plan. Medication adherence appears to be highly adherent. Control is suboptimal due to current lack of diet control and lack of exercise.  At last visit, patient agreed to make efforts to improve glucose control with Dr. Georg Ruddle.  -Extensively discussed pathophysiology of diabetes, recommended  lifestyle interventions, dietary effects on blood sugar control.  -Patient agreeable to lifestyle changes before considering adding on Trulicity (dulaglutide) for better glucose control. Reviewed this injection technique and also asked patient to investigate if Medullary thyroid was diagnosed in any family member. Medication information given to patient, she will research and follow-up at next visit.  She stated that she would be able to inject Trulicity.  -Next A1c anticipated June 2025.   ASCVD risk - primary prevention in patient with diabetes. Last LDL is 74 (11/01/22) not at goal of <70 mg/dL. ASCVD risk factors include T2DM, hypertension and 10-year ASCVD risk score of 15.2%. Moderate to high intensity statin indicated.  -Taking lovastatin 40mg  daily  Hypertension longstanding currently controlled on 4 drug regimen.  Blood pressure goal of <130/80 mmHg. Medication adherence reported as excellent.   Written patient instructions provided. Patient verbalized understanding of treatment plan.  Total time in face to face counseling 30 minutes.    Follow-up:  PCP clinic visit in June 2025 Pharmacist visit - TBD, based on next A1C Patient seen with Darolyn Rua, PharmD Candidate

## 2023-12-01 ENCOUNTER — Ambulatory Visit: Payer: Self-pay

## 2023-12-01 ENCOUNTER — Ambulatory Visit
Admission: EM | Admit: 2023-12-01 | Discharge: 2023-12-01 | Disposition: A | Discharge status: Home / Self Care | Attending: Internal Medicine | Admitting: Internal Medicine

## 2023-12-01 DIAGNOSIS — L2389 Allergic contact dermatitis due to other agents: Secondary | ICD-10-CM

## 2023-12-01 MED ORDER — HYDROCORTISONE 2.5 % EX LOTN: TOPICAL_LOTION | CUTANEOUS | 1 refills | Status: AC

## 2023-12-01 MED ORDER — TRIAMCINOLONE ACETONIDE 0.1 % EX CREA: 1.0000 | TOPICAL_CREAM | Freq: Two times a day (BID) | CUTANEOUS | 1 refills | Status: AC

## 2023-12-01 MED ORDER — METHYLPREDNISOLONE ACETATE 80 MG/ML IJ SUSP
20.0000 mg | Freq: Once | INTRAMUSCULAR | Status: AC
  Administered 2023-12-01: 20 mg via INTRAMUSCULAR

## 2023-12-01 NOTE — Discharge Instructions (Addendum)
 Symptoms and physical exam findings are most consistent with an allergic dermatitis from either contact with a plant or insect bites in the rain water bucket. This may take several weeks to resolve completely. We will treat this with the following:  Medrol  injection given today. This is a steroid to help with inflammation and pain.  Triamcinolone  cream twice daily to the affected area for 14 days.  May use this daily as needed after this for itching/rash.  Do not apply this to the neck or face. Hydrocortisone  cream twice daily to the face and neck for 14 days for itching/rash.  May use daily as needed for itching/rash after.  Wash the area with mild soap and water. Avoid prolonged sun exposure while using the steroid creams.  Return to urgent care or PCP if symptoms worsen or fail to resolve.

## 2023-12-01 NOTE — ED Triage Notes (Addendum)
 Patient presents with rash on bilateral arms x day 4. Pt states they were blistered and she used Cortisone cream. Patient reports itching.

## 2023-12-01 NOTE — ED Provider Notes (Signed)
 [name]    CSN: [id number] Arrival date & time: 12/01/23  1412      History   Chief Complaint No chief complaint on file.   HPI Kersti Scavone Duggin is a 68 y.o. female.   68 y.o. female who presents to urgent care with complaints of a itching rash on her forearms bilateral.  This started 4 days ago after she emptied a Rainwater bucket in her yard.  The Rainwater had greenish growth on the top as well as insects.  The rash is mostly isolated to her forearms from the elbow down to the wrist.  She was wearing gloves but her forearms were in contact with water.  She does have an area on her right upper lip and her left eyelid as well.  The areas were blisterlike in nature in the beginning and tender.  The itching was severe.  She did put cortisone cream on the areas and they have started to get better now but have not stopped itching completely.  At times the itching can be significant.  At current her forearms are not itching but her face is.  She denies any history of this in the past.     Past Medical History:  Diagnosis Date   Anemia    during pregnancy   Breast cancer (HCC) 2020   right breast lumpectomy 05-29-2019   Cataract 2016   RIGHT eye   Diabetes mellitus    Type 2=on meds   Family history of breast cancer    Family history of throat cancer    Glaucoma    bilateral - using eye drops   Hx of colonic polyps    Hyperlipidemia    on meds   Hypertension    on meds   Legally blind    completely blind in left eye, low vision in right   Personal history of chemotherapy 07/2019   stopped after 12 treatments   Personal history of radiation therapy    stopped 12/2019    Patient Active Problem List   Diagnosis Date Noted   Neuropathy 12/10/2022   Restless legs 09/06/2022   Legal blindness 03/15/2022   Constipation 03/15/2022   Strain of muscle, fascia and tendon of lower back, initial encounter 12/01/2021   Neutropenia (HCC) 10/20/2019    Port-A-Cath in place 08/27/2019   Family history of throat cancer    Family history of breast cancer    magrinatb 03/30/2019   Malignant neoplasm of upper-outer quadrant of right breast in female, estrogen receptor negative (HCC) 03/19/2019   Osteoarthritis of both shoulders 07/18/2017   Lumbar radiculopathy 07/18/2017   Bunion of great toe of right foot 07/18/2017   Umbilical hernia 02/10/2015   Primary open angle glaucoma of right eye, severe stage 12/20/2013   Primary open angle glaucoma of left eye, severe stage 12/14/2013   CATARACT, HX OF 06/28/2010   Hyperlipidemia 01/26/2009   OBESITY, UNSPECIFIED 11/22/2008   Type 2 diabetes mellitus with hyperglycemia (HCC) 06/23/2008   Open-angle glaucoma of both eyes, severe stage 05/26/2008   SYSTOLIC MURMUR 04/17/2007   HYPERTENSION, BENIGN SYSTEMIC 10/10/2006    Past Surgical History:  Procedure Laterality Date   BREAST EXCISIONAL BIOPSY     right breast 05/2019   BREAST LUMPECTOMY     BREAST LUMPECTOMY WITH RADIOACTIVE SEED AND SENTINEL LYMPH NODE BIOPSY Right 05/29/2019   Procedure: RIGHT BREAST LUMPECTOMY WITH RADIOACTIVE SEED AND RIGHT DEEP AXILLARY SENTINEL LYMPH NODE BIOPSY INJECT BLUE DYE;  Surgeon: Boyce Byes,  MD;  Location: [facility] OR;  Service: General;  Laterality: Right;   COLONOSCOPY  01/22/2012   Procedure: COLONOSCOPY;  Surgeon: Celedonio Coil, MD;  Location: [facility];  Service: Endoscopy;  Laterality: N/A;   COLONOSCOPY     COLONOSCOPY     HERNIA REPAIR     PORT-A-CATH REMOVAL Right 10/27/2020   Procedure: REMOVAL PORT-A-CATH;  Surgeon: Lockie Rima, MD;  Location: [facility] OR;  Service: General;  Laterality: Right;  ROOM 2 STARTING AT 03:00PM FOR 60 MIN   PORTACATH PLACEMENT N/A 05/29/2019   Procedure: INSERTION PORT-A-CATH WITH ULTRASOUND;  Surgeon: Boyce Byes, MD;  Location: [facility] OR;  Service: General;  Laterality: N/A;   RE-EXCISION OF BREAST LUMPECTOMY Right 06/10/2019   Procedure: RE-EXCISION OF RIGHT BREAST  LUMPECTOMY MARGINS;  Surgeon: Boyce Byes, MD;  Location: [facility] OR;  Service: General;  Laterality: Right;   TUBAL LIGATION      OB History     Gravida  3   Para  3   Term  3   Preterm      AB      Living         SAB      IAB      Ectopic      Multiple      Live Births               Home Medications    Prior to Admission medications   Medication Sig Start Date End Date Taking? Authorizing Provider  Alcohol Swabs (PHARMACIST CHOICE ALCOHOL) PADS  09/03/23   [provider]  amLODipine  (NORVASC ) 10 MG tablet TAKE 1 TABLET(10 MG) BY MOUTH DAILY 10/08/23   Lavada Porteous, DO  Blood Glucose Calibration (EMBRACE CONTROL) Low SOLN  09/03/23   [provider]  blood glucose meter kit and supplies KIT Dispense based on patient and insurance preference. Use up to four times daily as directed. (FOR ICD-9 250.00, 250.01). 09/29/15   Vicky Grange M, DO  Blood Glucose Monitoring Suppl [facility] BLOOD GLUCOSE MONITOR) DEVI  09/03/23   [provider]  Blood Glucose Monitoring Suppl (PRODIGY AUTOCODE BLOOD GLUCOSE) w/Device KIT 1 kit by Does not apply route in the morning, at noon, and at bedtime. Any brand acceptable if it works for patient 01/04/20   Russell Court, DO  EMBRACE BLOOD GLUCOSE TEST test strip  09/03/23   [provider]  Empagliflozin-metFORMIN  HCl ER (SYNJARDY  XR) 12.12-998 MG TB24 Take 1 tablet by mouth in the morning and at bedtime. 05/05/23   Santa Cuba, MD  gabapentin  (NEURONTIN ) 300 MG capsule TAKE 1 CAPSULE(300 MG) BY MOUTH THREE TIMES DAILY 11/12/23   Edison Gore, MD  glipiZIDE  (GLUCOTROL ) 10 MG tablet Take 1 tablet (10 mg total) by mouth daily. 03/14/23   Santa Cuba, MD  ibuprofen (ADVIL) 800 MG tablet Take 800 mg by mouth every 6 (six) hours as needed. 04/17/23   [provider]  Lancet Devices (SIMPLE DIAGNOSTICS LANCING DEV) MISC Apply topically. 09/03/23   [provider]  lisinopril  (ZESTRIL ) 40 MG  tablet TAKE 1 TABLET(40 MG) BY MOUTH DAILY 10/11/23   Lavada Porteous, DO  lovastatin  (MEVACOR ) 40 MG tablet TAKE 1 TABLET(40 MG) BY MOUTH AT BEDTIME 02/10/23   Santa Cuba, MD  metoprolol  succinate (TOPROL -XL) 50 MG 24 hr tablet TAKE 1 TABLET(50 MG) BY MOUTH DAILY 06/12/23   Santa Cuba, MD  Pharmacist Choice Lancets MISC  09/03/23   [provider]  spironolactone  (ALDACTONE ) 25 MG tablet Take  0.5 tablets (12.5 mg total) by mouth daily. 04/18/23   Santa Cuba, MD  timolol (TIMOPTIC) 0.5 % ophthalmic solution Apply to eye. 03/24/14   [provider]    Family History Family History  Problem Relation Age of Onset   Diabetes Mother    Hypertension Mother    Diabetes Father    Hypertension Father    Breast cancer Sister 53       Her2 +   Hypertension Paternal Uncle    Hypertension Maternal Grandmother    Diabetes Maternal Grandmother    Hypertension Paternal Grandmother    Diabetes Paternal Grandmother    Breast cancer Cousin        maternal second cousin, Bilateral mastectomy, diagnosed in her 22s   Colon polyps Neg Hx    Colon cancer Neg Hx    Esophageal cancer Neg Hx    Rectal cancer Neg Hx    Stomach cancer Neg Hx     Social History Social History   Tobacco Use   Smoking status: Former    Current packs/day: 0.00    Types: Cigarettes    Start date: 09/23/1987    Quit date: 09/23/1995    Years since quitting: 28.2    Passive exposure: Never   Smokeless tobacco: Never  Vaping Use   Vaping status: Never Used  Substance Use Topics   Alcohol use: No   Drug use: Not Currently    Types: Marijuana     Allergies   Sulfamethoxazole and Sulfur    Review of Systems Review of Systems  Constitutional:  Negative for chills and fever.  HENT:  Negative for ear pain and sore throat.   Eyes:  Negative for pain and visual disturbance.  Respiratory:  Negative for cough and shortness of breath.   Cardiovascular:  Negative for chest pain and palpitations.   Gastrointestinal:  Negative for abdominal pain and vomiting.  Genitourinary:  Negative for dysuria and hematuria.  Musculoskeletal:  Negative for arthralgias and back pain.  Skin:  Positive for rash. Negative for color change.  Neurological:  Negative for seizures and syncope.  All other systems reviewed and are negative.    Physical Exam Triage Vital Signs ED Triage Vitals  Encounter Vitals Group     BP      Systolic BP Percentile      Diastolic BP Percentile      Pulse      Resp      Temp      Temp src      SpO2      Weight      Height      Head Circumference      Peak Flow      Pain Score      Pain Loc      Pain Education      Exclude from Growth Chart    No data found.  Updated Vital Signs There were no vitals taken for this visit.  Visual Acuity Right Eye Distance:   Left Eye Distance:   Bilateral Distance:    Right Eye Near:   Left Eye Near:    Bilateral Near:     Physical Exam Vitals and nursing note reviewed.  Constitutional:      General: She is not in acute distress.    Appearance: She is well-developed.  HENT:     Head: Normocephalic and atraumatic.  Eyes:     Conjunctiva/sclera: Conjunctivae normal.  Cardiovascular:     Rate and Rhythm:  Normal rate and regular rhythm.     Heart sounds: No murmur heard. Pulmonary:     Effort: Pulmonary effort is normal. No respiratory distress.     Breath sounds: Normal breath sounds.  Abdominal:     Palpations: Abdomen is soft.     Tenderness: There is no abdominal tenderness.  Musculoskeletal:        General: No swelling.     Cervical back: Neck supple.  Skin:    General: Skin is warm and dry.     Capillary Refill: Capillary refill takes less than 2 seconds.     Findings: Rash present. Rash is nodular and urticarial.          Comments: Raised firm nodules on both forearms anterior and posterior with some areas clustered, evidence of previous vesicles, some scabs present, 1 large area along the left  medial forearm close to the wrist.  No open draining areas now, no blisters now.  Neurological:     Mental Status: She is alert.  Psychiatric:        Mood and Affect: Mood normal.      UC Treatments / Results  Labs (all labs ordered are listed, but only abnormal results are displayed) Labs Reviewed - No data to display  EKG   Radiology No results found.  Procedures Procedures (including critical care time)  Medications Ordered in UC Medications - No data to display  Initial Impression / Assessment and Plan / UC Course  I have reviewed the triage vital signs and the nursing notes.  Pertinent labs & imaging results that were available during my care of the patient were reviewed by me and considered in my medical decision making (see chart for details).     Allergic contact dermatitis due to other agents   Symptoms and physical exam findings are most consistent with an allergic dermatitis from either contact with a plant or bug in the rain water bucket. This may take several weeks to resolve completely. We will treat this with the following:  Medrol  injection given today. This is a steroid to help with inflammation and pain.  Triamcinolone  cream twice daily to the affected area for 14 days.  May use this daily as needed after this for itching/rash.  Do not apply this to the neck or face. Hydrocortisone  cream twice daily to the face and neck for 7 to 10 days for itching/rash.  May use daily as needed for itching/rash after.  Wash the area with mild soap and water  Return to urgent care or PCP if symptoms worsen or fail to resolve.    Final Clinical Impressions(s) / UC Diagnoses   Final diagnoses:  None   Discharge Instructions   None    ED Prescriptions   None    PDMP not reviewed this encounter.   Kreg Pesa, [place] 12/01/23 1514

## 2023-12-11 ENCOUNTER — Other Ambulatory Visit: Payer: Self-pay | Admitting: Family Medicine

## 2023-12-11 DIAGNOSIS — E1165 Type 2 diabetes mellitus with hyperglycemia: Secondary | ICD-10-CM

## 2024-01-04 ENCOUNTER — Other Ambulatory Visit: Payer: Self-pay | Admitting: Family Medicine

## 2024-01-04 ENCOUNTER — Other Ambulatory Visit: Payer: Self-pay | Admitting: Student

## 2024-01-04 DIAGNOSIS — I10 Essential (primary) hypertension: Secondary | ICD-10-CM

## 2024-01-04 DIAGNOSIS — E1169 Type 2 diabetes mellitus with other specified complication: Secondary | ICD-10-CM

## 2024-01-04 DIAGNOSIS — E1165 Type 2 diabetes mellitus with hyperglycemia: Secondary | ICD-10-CM

## 2024-01-22 ENCOUNTER — Other Ambulatory Visit: Payer: Self-pay | Admitting: Student

## 2024-01-22 DIAGNOSIS — E1165 Type 2 diabetes mellitus with hyperglycemia: Secondary | ICD-10-CM

## 2024-01-28 ENCOUNTER — Ambulatory Visit: Admitting: Family Medicine

## 2024-01-29 DIAGNOSIS — H04123 Dry eye syndrome of bilateral lacrimal glands: Secondary | ICD-10-CM | POA: Diagnosis not present

## 2024-01-29 DIAGNOSIS — M316 Other giant cell arteritis: Secondary | ICD-10-CM | POA: Diagnosis not present

## 2024-01-29 DIAGNOSIS — H541224 Low vision right eye category 2, blindness left eye category 4: Secondary | ICD-10-CM | POA: Diagnosis not present

## 2024-01-29 DIAGNOSIS — H401133 Primary open-angle glaucoma, bilateral, severe stage: Secondary | ICD-10-CM | POA: Diagnosis not present

## 2024-02-04 ENCOUNTER — Encounter: Payer: Self-pay | Admitting: Family Medicine

## 2024-02-04 ENCOUNTER — Ambulatory Visit: Admitting: Family Medicine

## 2024-02-04 VITALS — BP 100/70 | HR 68 | Wt 155.0 lb

## 2024-02-04 DIAGNOSIS — E1165 Type 2 diabetes mellitus with hyperglycemia: Secondary | ICD-10-CM

## 2024-02-04 LAB — POCT GLYCOSYLATED HEMOGLOBIN (HGB A1C): HbA1c, POC (controlled diabetic range): 9.3 % — AB (ref 0.0–7.0)

## 2024-02-04 MED ORDER — TRULICITY 0.75 MG/0.5ML ~~LOC~~ SOAJ: 0.7500 mg | SUBCUTANEOUS | 2 refills | Status: DC

## 2024-02-04 NOTE — Progress Notes (Signed)
    SUBJECTIVE:   CHIEF COMPLAINT / HPI:  Megan Khan is a 68yo F w/ hx of T2DM that pf f/u - Reports she is taking Synjardy  consistently - She also spoke to her pharmacist, who recommended increasing her glipizide  if she wants to avoid injection - She is hesitant to start GLP1 because she doesn't want to give herself an injection.  - At prior visit w/ Dr. Koval, plan was to try orals and recheck A1c, and consider adding GLP1 if not at goal  OBJECTIVE:   BP 100/70   Pulse 68   Wt 155 lb (70.3 kg)   SpO2 92%   BMI 29.29 kg/m   General: Alert, pleasant woman. NAD. HEENT: NCAT. MMM. CV: RRR, no murmurs.  Resp: CTAB, no wheezing or crackles. Normal WOB on RA.  Abm: Soft, nontender, nondistended. BS present. Ext: Moves all ext spontaneously Skin: Warm, well perfused   ASSESSMENT/PLAN:   Assessment & Plan Type 2 diabetes mellitus with hyperglycemia, unspecified whether long term insulin  use (HCC) Stably above goal. Discussed that GLP1 would have greater health benefit than increasing her glipizide . Pt is agreeable to trying Trulicity. - Start trulicity 0.75mg  weekly - Cont Synjardy  and glipizide  - Check fasting BG daily. If AM BG<80, hold glipizide . - f/u in 4-6wks. Increase Trulicity if tolerating.    Megan Nearing, MD [facility]

## 2024-02-04 NOTE — Patient Instructions (Signed)
 Good to see you today - Thank you for coming in  Things we discussed today:  1) Your A1c is 9.3 today, which is high but not worse.  - Start taking Trulicity 0.75mg  once a week  - Common side effects include nausea, stomach upset, constipation. This goes away as your body gets used to the medication. - You may notice some weight loss and decreased appetite, which is normal - Check your sugar every morning before eating anything. If your sugar is lower than 80, then don't take your glipizide  that day.  - Bring in your glucometer so we can review your sugars next time   Come back to see me in 4-6 weeks.

## 2024-02-05 DIAGNOSIS — H401133 Primary open-angle glaucoma, bilateral, severe stage: Secondary | ICD-10-CM | POA: Diagnosis not present

## 2024-02-05 DIAGNOSIS — H04123 Dry eye syndrome of bilateral lacrimal glands: Secondary | ICD-10-CM | POA: Diagnosis not present

## 2024-02-05 DIAGNOSIS — H541224 Low vision right eye category 2, blindness left eye category 4: Secondary | ICD-10-CM | POA: Diagnosis not present

## 2024-02-05 DIAGNOSIS — M316 Other giant cell arteritis: Secondary | ICD-10-CM | POA: Diagnosis not present

## 2024-02-06 NOTE — Assessment & Plan Note (Signed)
 Stably above goal. Discussed that GLP1 would have greater health benefit than increasing her glipizide . Pt is agreeable to trying Trulicity. - Start trulicity 0.75mg  weekly - Cont Synjardy  and glipizide  - Check fasting BG daily. If AM BG<80, hold glipizide . - f/u in 4-6wks. Increase Trulicity if tolerating.

## 2024-02-20 ENCOUNTER — Telehealth: Payer: Self-pay

## 2024-02-20 NOTE — Telephone Encounter (Signed)
 Called patient and advised of this.   Placed order form in fax pile.   Chiquita JAYSON English, RN

## 2024-02-20 NOTE — Telephone Encounter (Addendum)
 Patient calls nurse line regarding updating paperwork for diabetes supplies through [employer] [facility] [employer].   Patient reports that last order form was changed to reflect her only testing once per day. She states that she is testing twice per day.  Called [facility] [employer]. They will need a new form to be completed for twice daily testing.   They are faxing new form over now.   Chiquita JAYSON English, RN

## 2024-02-27 ENCOUNTER — Other Ambulatory Visit: Payer: Self-pay | Admitting: Family Medicine

## 2024-02-27 DIAGNOSIS — E1165 Type 2 diabetes mellitus with hyperglycemia: Secondary | ICD-10-CM

## 2024-03-04 DIAGNOSIS — H401133 Primary open-angle glaucoma, bilateral, severe stage: Secondary | ICD-10-CM | POA: Diagnosis not present

## 2024-03-04 DIAGNOSIS — M316 Other giant cell arteritis: Secondary | ICD-10-CM | POA: Diagnosis not present

## 2024-03-04 DIAGNOSIS — H04123 Dry eye syndrome of bilateral lacrimal glands: Secondary | ICD-10-CM | POA: Diagnosis not present

## 2024-03-04 DIAGNOSIS — H541224 Low vision right eye category 2, blindness left eye category 4: Secondary | ICD-10-CM | POA: Diagnosis not present

## 2024-03-24 ENCOUNTER — Other Ambulatory Visit: Payer: Self-pay | Admitting: Family Medicine

## 2024-03-24 DIAGNOSIS — Z8639 Personal history of other endocrine, nutritional and metabolic disease: Secondary | ICD-10-CM

## 2024-03-26 ENCOUNTER — Other Ambulatory Visit: Payer: Self-pay

## 2024-03-26 DIAGNOSIS — E1165 Type 2 diabetes mellitus with hyperglycemia: Secondary | ICD-10-CM

## 2024-03-26 DIAGNOSIS — I10 Essential (primary) hypertension: Secondary | ICD-10-CM

## 2024-03-26 MED ORDER — AMLODIPINE BESYLATE 10 MG PO TABS: ORAL_TABLET | ORAL | 2 refills | Status: DC

## 2024-03-26 MED ORDER — LISINOPRIL 40 MG PO TABS: 40.0000 mg | ORAL_TABLET | Freq: Every day | ORAL | 0 refills | Status: DC

## 2024-04-01 DIAGNOSIS — H04123 Dry eye syndrome of bilateral lacrimal glands: Secondary | ICD-10-CM | POA: Diagnosis not present

## 2024-04-01 DIAGNOSIS — H541224 Low vision right eye category 2, blindness left eye category 4: Secondary | ICD-10-CM | POA: Diagnosis not present

## 2024-04-01 DIAGNOSIS — H401133 Primary open-angle glaucoma, bilateral, severe stage: Secondary | ICD-10-CM | POA: Diagnosis not present

## 2024-04-01 DIAGNOSIS — M316 Other giant cell arteritis: Secondary | ICD-10-CM | POA: Diagnosis not present

## 2024-04-07 ENCOUNTER — Other Ambulatory Visit: Payer: Self-pay | Admitting: Family Medicine

## 2024-04-07 DIAGNOSIS — E1165 Type 2 diabetes mellitus with hyperglycemia: Secondary | ICD-10-CM

## 2024-04-27 ENCOUNTER — Ambulatory Visit: Payer: 59

## 2024-04-27 VITALS — Ht 61.0 in | Wt 152.0 lb

## 2024-04-27 DIAGNOSIS — Z0000 Encounter for general adult medical examination without abnormal findings: Secondary | ICD-10-CM | POA: Diagnosis not present

## 2024-04-27 NOTE — Progress Notes (Signed)
 Because this visit was a virtual/telehealth visit,  certain criteria was not obtained, such a blood pressure, CBG if applicable, and timed get up and go. Any medications not marked as taking were not mentioned during the medication reconciliation part of the visit. Any vitals not documented were not able to be obtained due to this being a telehealth visit or patient was unable to self-report a recent blood pressure reading due to a lack of equipment at home via telehealth. Vitals that have been documented are verbally provided by the patient.   Subjective:   Megan Khan is a 68 y.o. who presents for a Medicare Wellness preventive visit.  As a reminder, Annual Wellness Visits don't include a physical exam, and some assessments may be limited, especially if this visit is performed virtually. We may recommend an in-person follow-up visit with your provider if needed.  Visit Complete: Virtual I connected with  Megan Khan on 04/27/24 by a audio enabled telemedicine application and verified that I am speaking with the correct person using two identifiers.  Patient Location: Home  Provider Location: Home Office  I discussed the limitations of evaluation and management by telemedicine. The patient expressed understanding and agreed to proceed.  Vital Signs: Because this visit was a virtual/telehealth visit, some criteria may be missing or patient reported. Any vitals not documented were not able to be obtained and vitals that have been documented are patient reported.  VideoDeclined- This patient declined Librarian, academic. Therefore the visit was completed with audio only.  Persons Participating in Visit: Patient.  AWV Questionnaire: No: Patient Medicare AWV questionnaire was not completed prior to this visit.  Cardiac Risk Factors include: advanced age (>41men, >17 women);diabetes mellitus;hypertension;dyslipidemia;family history of premature  cardiovascular disease     Objective:    Today's Vitals   04/27/24 1033  Weight: 152 lb (68.9 kg)  Height: 5' 1 (1.549 m)  PainSc: 0-No pain   Body mass index is 28.72 kg/m.     04/27/2024   10:37 AM 02/04/2024   10:48 AM 12/01/2023    2:29 PM 10/14/2023   10:16 AM 04/26/2023    9:44 AM 12/10/2022   11:32 AM 11/01/2022   12:02 PM  Advanced Directives  Does Patient Have a Medical Advance Directive? No No No No No No No  Would patient like information on creating a medical advance directive? No - Patient declined No - Patient declined  No - Patient declined Yes (MAU/Ambulatory/Procedural Areas - Information given) No - Patient declined No - Patient declined    Current Medications (verified) Outpatient Encounter Medications as of 04/27/2024  Medication Sig   Alcohol Swabs (PHARMACIST CHOICE ALCOHOL) PADS    amLODipine  (NORVASC ) 10 MG tablet TAKE 1 TABLET(10 MG) BY MOUTH DAILY   Blood Glucose Calibration (EMBRACE CONTROL) Low SOLN    blood glucose meter kit and supplies KIT Dispense based on patient and insurance preference. Use up to four times daily as directed. (FOR ICD-9 250.00, 250.01).   Blood Glucose Monitoring Suppl (EMBRACE BLOOD GLUCOSE MONITOR) DEVI    Blood Glucose Monitoring Suppl (PRODIGY AUTOCODE BLOOD GLUCOSE) w/Device KIT 1 kit by Does not apply route in the morning, at noon, and at bedtime. Any brand acceptable if it works for patient   Dulaglutide  (TRULICITY ) 0.75 MG/0.5ML SOAJ Inject 0.75 mg into the skin once a week.   EMBRACE BLOOD GLUCOSE TEST test strip    gabapentin  (NEURONTIN ) 300 MG capsule TAKE 1 CAPSULE(300 MG) BY MOUTH THREE TIMES  DAILY   glipiZIDE  (GLUCOTROL ) 10 MG tablet TAKE 1 TABLET(10 MG) BY MOUTH DAILY   hydrocortisone  2.5 % lotion Apply twice daily for 14 days, then daily as needed for itching/rash. This is to use on the face/neck   ibuprofen (ADVIL) 800 MG tablet Take 800 mg by mouth every 6 (six) hours as needed.   Lancet Devices (SIMPLE  DIAGNOSTICS LANCING DEV) MISC Apply topically.   lisinopril  (ZESTRIL ) 40 MG tablet Take 1 tablet (40 mg total) by mouth daily.   lovastatin  (MEVACOR ) 40 MG tablet TAKE 1 TABLET(40 MG) BY MOUTH AT BEDTIME   metoprolol  succinate (TOPROL -XL) 50 MG 24 hr tablet TAKE 1 TABLET(50 MG) BY MOUTH DAILY   Pharmacist Choice Lancets MISC    spironolactone  (ALDACTONE ) 25 MG tablet TAKE 1/2 TABLET(12.5 MG) BY MOUTH DAILY   SYNJARDY  XR 12.12-998 MG TB24 TAKE 1 TABLET BY MOUTH IN THE MORNING AND AT BEDTIME   timolol (TIMOPTIC) 0.5 % ophthalmic solution Apply to eye.   triamcinolone  cream (KENALOG ) 0.1 % Apply 1 Application topically 2 (two) times daily. Apply twice daily for 14 days, then daily as needed for itching/rash. Do not apply to face/neck   No facility-administered encounter medications on file as of 04/27/2024.    Allergies (verified) Sulfamethoxazole and Sulfur    History: Past Medical History:  Diagnosis Date   Anemia    during pregnancy   Breast cancer (HCC) 2020   right breast lumpectomy 05-29-2019   Cataract 2016   RIGHT eye   Diabetes mellitus    Type 2=on meds   Family history of breast cancer    Family history of throat cancer    Glaucoma    bilateral - using eye drops   Hx of colonic polyps    Hyperlipidemia    on meds   Hypertension    on meds   Legally blind    completely blind in left eye, low vision in right   Personal history of chemotherapy 07/2019   stopped after 12 treatments   Personal history of radiation therapy    stopped 12/2019   Past Surgical History:  Procedure Laterality Date   BREAST EXCISIONAL BIOPSY     right breast 05/2019   BREAST LUMPECTOMY     BREAST LUMPECTOMY WITH RADIOACTIVE SEED AND SENTINEL LYMPH NODE BIOPSY Right 05/29/2019   Procedure: RIGHT BREAST LUMPECTOMY WITH RADIOACTIVE SEED AND RIGHT DEEP AXILLARY SENTINEL LYMPH NODE BIOPSY INJECT BLUE DYE;  Surgeon: Gail Favorite, MD;  Location: [facility] OR;  Service: General;  Laterality: Right;    COLONOSCOPY  01/22/2012   Procedure: COLONOSCOPY;  Surgeon: Lesta JULIANNA Fitz, MD;  Location: [facility]COPY;  Service: Endoscopy;  Laterality: N/A;   COLONOSCOPY     COLONOSCOPY     HERNIA REPAIR     PORT-A-CATH REMOVAL Right 10/27/2020   Procedure: REMOVAL PORT-A-CATH;  Surgeon: Aron Shoulders, MD;  Location: [facility] OR;  Service: General;  Laterality: Right;  ROOM 2 STARTING AT 03:00PM FOR 60 MIN   PORTACATH PLACEMENT N/A 05/29/2019   Procedure: INSERTION PORT-A-CATH WITH ULTRASOUND;  Surgeon: Gail Favorite, MD;  Location: [facility] OR;  Service: General;  Laterality: N/A;   RE-EXCISION OF BREAST LUMPECTOMY Right 06/10/2019   Procedure: RE-EXCISION OF RIGHT BREAST LUMPECTOMY MARGINS;  Surgeon: Gail Favorite, MD;  Location: [facility] OR;  Service: General;  Laterality: Right;   TUBAL LIGATION     Family History  Problem Relation Age of Onset   Diabetes Mother    Hypertension Mother    Diabetes Father  Hypertension Father    Breast cancer Sister 11       Her2 +   Hypertension Paternal Uncle    Hypertension Maternal Grandmother    Diabetes Maternal Grandmother    Hypertension Paternal Grandmother    Diabetes Paternal Grandmother    Breast cancer Cousin        maternal second cousin, Bilateral mastectomy, diagnosed in her 35s   Colon polyps Neg Hx    Colon cancer Neg Hx    Esophageal cancer Neg Hx    Rectal cancer Neg Hx    Stomach cancer Neg Hx    Social History   Socioeconomic History   Marital status: Divorced    Spouse name: Not on file   Number of children: 3   Years of education: 12   Highest education level: 12th grade  Occupational History   Occupation: Disabled  Tobacco Use   Smoking status: Former    Current packs/day: 0.00    Types: Cigarettes    Start date: 09/23/1987    Quit date: 09/23/1995    Years since quitting: 28.6    Passive exposure: Never   Smokeless tobacco: Never  Vaping Use   Vaping status: Never Used  Substance and Sexual Activity   Alcohol use: No   Drug  use: Not Currently    Types: Marijuana   Sexual activity: Not Currently    Birth control/protection: Surgical, Post-menopausal    Comment: tubal Ligations  Other Topics Concern   Not on file  Social History Narrative   Patient lives alone in [place] with her dog Tiny.   Patient is legally blind and on disability.   Patient no longer drives, however has no concerns for transportation at this time.    Patient enjoys spending time with family, singing in her church choir, and sitting outside.    Patient has 2 living children and 1 who died last year (a patient of [facility].)   One son died at age 19 due to gun shot   Social Drivers of Health   Financial Resource Strain: Low Risk  (04/27/2024)   Overall Financial Resource Strain (CARDIA)    Difficulty of Paying Living Expenses: Not hard at all  Food Insecurity: No Food Insecurity (04/27/2024)   Hunger Vital Sign    Worried About Running Out of Food in the Last Year: Never true    Ran Out of Food in the Last Year: Never true  Transportation Needs: No Transportation Needs (04/27/2024)   PRAPARE - Administrator, Civil Service (Medical): No    Lack of Transportation (Non-Medical): No  Physical Activity: Insufficiently Active (04/27/2024)   Exercise Vital Sign    Days of Exercise per Week: 3 days    Minutes of Exercise per Session: 30 min  Stress: No Stress Concern Present (04/27/2024)   [employer] of Occupational Health - Occupational Stress Questionnaire    Feeling of Stress: Not at all  Social Connections: Moderately Integrated (04/27/2024)   Social Connection and Isolation Panel    Frequency of Communication with Friends and Family: More than three times a week    Frequency of Social Gatherings with Friends and Family: Three times a week    Attends Religious Services: More than 4 times per year    Active Member of Clubs or Organizations: Yes    Attends Banker Meetings: 1 to 4 times per year    Marital  Status: Divorced    Tobacco Counseling Counseling given: Not Answered  Clinical Intake:  Pre-visit preparation completed: Yes  Pain : No/denies pain Pain Score: 0-No pain     BMI - recorded: 28.72 Nutritional Status: BMI 25 -29 Overweight Nutritional Risks: None Diabetes: Yes CBG done?: No Did pt. bring in CBG monitor from home?: No  Lab Results  Component Value Date   HGBA1C 9.3 (A) 02/04/2024   HGBA1C 9.3 (A) 10/14/2023   HGBA1C 9.6 (A) 12/10/2022     How often do you need to have someone help you when you read instructions, pamphlets, or other written materials from your doctor or pharmacy?: 1 - Never  Interpreter Needed?: No  Information entered by :: Joylene Wescott N. Gilmore List, LPN.   Activities of Daily Living     04/27/2024   10:38 AM  In your present state of health, do you have any difficulty performing the following activities:  Hearing? 0  Vision? 0  Difficulty concentrating or making decisions? 0  Comment BSE: NOT AT THIS TIME  Walking or climbing stairs? 0  Dressing or bathing? 0  Doing errands, shopping? 0  Preparing Food and eating ? N  Using the Toilet? N  In the past six months, have you accidently leaked urine? N  Do you have problems with loss of bowel control? N  Managing your Medications? N  Managing your Finances? N  Housekeeping or managing your Housekeeping? N    Patient Care Team: Nicholas Bar, MD as PCP - General (Family Medicine) [name], Toribio SAUNDERS, MD as PCP - Advanced Heart Failure (Cardiology) Dewey Rush, MD as Consulting Physician (Radiation Oncology) Magdalen Pasco RAMAN, DPM as Consulting Physician (Podiatry) Cyrus Carwin, MD as Consulting Physician (Ophthalmology) Lennard Lesta FALCON, MD as Consulting Physician (Gastroenterology) Cyrus Carwin, MD as Consulting Physician (Ophthalmology)  I have updated your Care Teams any recent Medical Services you may have received from other providers in the past year.     Assessment:    This is a routine wellness examination for Megan Khan.  Hearing/Vision screen Hearing Screening - Comments:: Adequate hearing, no hearing aids. Vision Screening - Comments:: Patient legally blind in left eye and very low vision in right eye.  No prescription eyeglasses. Patient sees Carwin Cyrus, MD. (Glaucoma specialist) every 3 months.   Goals Addressed             This Visit's Progress    Client will verbalize knowledge of diabetes self-management as evidenced by Hgb A1C <7 or as defined by provider.             Depression Screen     04/27/2024   10:42 AM 02/04/2024   10:48 AM 04/26/2023    9:42 AM 12/10/2022   11:40 AM 11/01/2022   12:02 PM 09/06/2022   10:45 AM 08/08/2022   10:38 AM  PHQ 2/9 Scores  PHQ - 2 Score 0 0 0 0 0 0 1  PHQ- 9 Score 0 0  0 0 0     Fall Risk     04/27/2024   10:37 AM 02/04/2024   10:48 AM 04/26/2023    9:43 AM 12/10/2022   11:33 AM 11/01/2022   12:02 PM  Fall Risk   Falls in the past year? 0 0 0 0 0  Number falls in past yr: 0 0 0  0  Injury with Fall? 0 0 0  0  Risk for fall due to : No Fall Risks Impaired vision No Fall Risks    Follow up Falls evaluation completed Falls prevention discussed Falls prevention discussed;Education  provided;Falls evaluation completed      MEDICARE RISK AT HOME:  Medicare Risk at Home Any stairs in or around the home?: No If so, are there any without handrails?: No Home free of loose throw rugs in walkways, pet beds, electrical cords, etc?: Yes Adequate lighting in your home to reduce risk of falls?: Yes Life alert?: No Use of a cane, walker or w/c?: No Grab bars in the bathroom?: No Shower chair or bench in shower?: No Elevated toilet seat or a handicapped toilet?: No  TIMED UP AND GO:  Was the test performed?  No  Cognitive Function: Declined/Normal: No cognitive concerns noted by patient or family. Patient alert, oriented, able to answer questions appropriately and recall recent events. No signs of memory  loss or confusion.    04/27/2024   10:42 AM  MMSE - Mini Mental State Exam  Not completed: Unable to complete        04/27/2024   10:34 AM 04/26/2023    9:44 AM 04/05/2021   11:20 AM  6CIT Screen  What Year? 0 points 0 points 0 points  What month? 0 points 0 points 0 points  What time? 0 points 0 points 0 points  Count back from 20 0 points 0 points 0 points  Months in reverse 0 points 0 points 0 points  Repeat phrase 0 points 0 points 0 points  Total Score 0 points 0 points 0 points    Immunizations Immunization History  Administered Date(s) Administered   Fluad Quad(high Dose 65+) 05/03/2022   Fluad Trivalent(High Dose 65+) 10/14/2023   INFLUENZA, HIGH DOSE SEASONAL PF 04/22/2023   Influenza Split 10/03/2011, 05/12/2012   Influenza Whole 05/26/2008, 09/07/2009, 05/17/2010   Influenza,inj,Quad PF,6+ Mos 05/21/2013, 05/13/2015, 08/08/2016, 04/19/2017, 05/22/2018, 06/24/2019, 04/27/2020, 05/12/2021   PFIZER Comirnaty(Gray Top)Covid-19 Tri-Sucrose Vaccine 12/06/2020, 12/06/2020, 04/20/2021   PFIZER(Purple Top)SARS-COV-2 Vaccination 03/12/2020, 04/05/2020   PNEUMOCOCCAL CONJUGATE-20 11/01/2022   PPD Test 06/20/2012, 10/19/2013   Pfizer Covid-19 Vaccine Bivalent Booster 70yrs & up 09/13/2021   Pfizer(Comirnaty)Fall Seasonal Vaccine 12 years and older 10/14/2023   Pneumococcal Polysaccharide-23 02/22/2016   Td 01/16/2005   Tdap 02/22/2016   Zoster Recombinant(Shingrix ) 01/18/2021, 03/16/2021    Screening Tests Health Maintenance  Topic Date Due   FOOT EXAM  11/17/2022   OPHTHALMOLOGY EXAM  07/13/2023   Influenza Vaccine  03/13/2024   COVID-19 Vaccine (8 - Pfizer risk 2024-25 season) 04/15/2024   HEMOGLOBIN A1C  08/05/2024   Diabetic kidney evaluation - eGFR measurement  10/13/2024   Diabetic kidney evaluation - Urine ACR  10/13/2024   Medicare Annual Wellness (AWV)  04/27/2025   DTaP/Tdap/Td (3 - Td or Tdap) 02/21/2026   Colonoscopy  06/07/2032   Pneumococcal Vaccine:  50+ Years  Completed   DEXA SCAN  Completed   Hepatitis C Screening  Completed   Zoster Vaccines- Shingrix   Completed   HPV VACCINES  Aged Out   Meningococcal B Vaccine  Aged Out   Mammogram  Discontinued    Health Maintenance Items Addressed: Yes Patient aware of current care gaps.  Patient is due for the following: Diabetic Foot Exam, Flu and Covid vaccines.d  Additional Screening:  Vision Screening: Recommended annual ophthalmology exams for early detection of glaucoma and other disorders of the eye. Is the patient up to date with their annual eye exam?  Yes  Who is the provider or what is the name of the office in which the patient attends annual eye exams? Gaither Quan, MD (Glaucoma Specialist)  Dental  Screening: Recommended annual dental exams for proper oral hygiene  Community Resource Referral / Chronic Care Management: CRR required this visit?  No   CCM required this visit?  No   Plan:    I have personally reviewed and noted the following in the patient's chart:   Medical and social history Use of alcohol, tobacco or illicit drugs  Current medications and supplements including opioid prescriptions. Patient is not currently taking opioid prescriptions. Functional ability and status Nutritional status Physical activity Advanced directives List of other physicians Hospitalizations, surgeries, and ER visits in previous 12 months Vitals Screenings to include cognitive, depression, and falls Referrals and appointments  In addition, I have reviewed and discussed with patient certain preventive protocols, quality metrics, and best practice recommendations. A written personalized care plan for preventive services as well as general preventive health recommendations were provided to patient.   Roz LOISE Fuller, LPN   [id number]   After Visit Summary: (Declined) Due to this being a telephonic visit, with patients personalized plan was offered to patient but patient  Declined AVS at this time   Notes: Nothing significant to report at this time.

## 2024-04-27 NOTE — Patient Instructions (Signed)
 Megan Khan,  Thank you for taking the time for your Medicare Wellness Visit. I appreciate your continued commitment to your health goals. Please review the care plan we discussed, and feel free to reach out if I can assist you further.  Medicare recommends these wellness visits once per year to help you and your care team stay ahead of potential health issues. These visits are designed to focus on prevention, allowing your provider to concentrate on managing your acute and chronic conditions during your regular appointments.  Please note that Annual Wellness Visits do not include a physical exam. Some assessments may be limited, especially if the visit was conducted virtually. If needed, we may recommend a separate in-person follow-up with your provider.  Ongoing Care Seeing your primary care provider every 3 to 6 months helps us  monitor your health and provide consistent, personalized care.   Referrals If a referral was made during today's visit and you haven't received any updates within two weeks, please contact the referred provider directly to check on the status.  Recommended Screenings:  Health Maintenance  Topic Date Due   Complete foot exam   11/17/2022   Eye exam for diabetics  07/13/2023   Flu Shot  03/13/2024   COVID-19 Vaccine (8 - Pfizer risk 2024-25 season) 04/15/2024   Hemoglobin A1C  08/05/2024   Yearly kidney function blood test for diabetes  10/13/2024   Yearly kidney health urinalysis for diabetes  10/13/2024   Medicare Annual Wellness Visit  04/27/2025   DTaP/Tdap/Td vaccine (3 - Td or Tdap) 02/21/2026   Colon Cancer Screening  06/07/2032   Pneumococcal Vaccine for age over 57  Completed   DEXA scan (bone density measurement)  Completed   Hepatitis C Screening  Completed   Zoster (Shingles) Vaccine  Completed   HPV Vaccine  Aged Out   Meningitis B Vaccine  Aged Out   Breast Cancer Screening  Discontinued       04/27/2024   10:37 AM  Advanced Directives   Does Patient Have a Medical Advance Directive? No  Would patient like information on creating a medical advance directive? No - Patient declined   Advance Care Planning is important because it: Ensures you receive medical care that aligns with your values, goals, and preferences. Provides guidance to your family and loved ones, reducing the emotional burden of decision-making during critical moments.  Vision: Annual vision screenings are recommended for early detection of glaucoma, cataracts, and diabetic retinopathy. These exams can also reveal signs of chronic conditions such as diabetes and high blood pressure.  Dental: Annual dental screenings help detect early signs of oral cancer, gum disease, and other conditions linked to overall health, including heart disease and diabetes.  Please see the attached documents for additional preventive care recommendations.

## 2024-05-04 ENCOUNTER — Other Ambulatory Visit: Payer: Self-pay | Admitting: Family Medicine

## 2024-05-04 DIAGNOSIS — Z853 Personal history of malignant neoplasm of breast: Secondary | ICD-10-CM

## 2024-05-08 ENCOUNTER — Encounter: Payer: Self-pay | Admitting: Family Medicine

## 2024-05-08 ENCOUNTER — Ambulatory Visit: Admitting: Family Medicine

## 2024-05-08 VITALS — BP 116/78 | HR 64 | Ht 61.0 in | Wt 154.8 lb

## 2024-05-08 DIAGNOSIS — E1165 Type 2 diabetes mellitus with hyperglycemia: Secondary | ICD-10-CM | POA: Diagnosis not present

## 2024-05-08 DIAGNOSIS — G2581 Restless legs syndrome: Secondary | ICD-10-CM

## 2024-05-08 DIAGNOSIS — Z23 Encounter for immunization: Secondary | ICD-10-CM

## 2024-05-08 LAB — POCT GLYCOSYLATED HEMOGLOBIN (HGB A1C): HbA1c, POC (controlled diabetic range): 8.9 % — AB (ref 0.0–7.0)

## 2024-05-08 MED ORDER — GABAPENTIN 300 MG PO CAPS: 300.0000 mg | ORAL_CAPSULE | Freq: Three times a day (TID) | ORAL | 1 refills | Status: DC

## 2024-05-08 MED ORDER — TRULICITY 0.75 MG/0.5ML ~~LOC~~ SOAJ: 0.7500 mg | SUBCUTANEOUS | 2 refills | Status: DC

## 2024-05-08 NOTE — Patient Instructions (Addendum)
 It was wonderful to see you today.  Please bring ALL of your medications with you to every visit.   Today we talked about:  Diabetes - Please stop your glipizide . We are going to restart your trulicity  at the lowest dose. Let me know if you have any low blood sugars. We will follow up in one month to see if we can go up on the dose.   Hypertension - Your blood pressure is well controlled on your current medications   For your restless leg syndrome I am going to check your iron levels. Sometimes low iron can cause these symptoms. In the meantime I refilled your gabapentin . You can take two pills at night and one during the day if you need it.   Please have your eye doctor send us  their office notes for our records.    Please follow up in 1 month   Thank you for choosing [facility].   Please call [phone] with any questions about today's appointment.  Please be sure to schedule follow up at the front desk before you leave today.   Areta Saliva, MD  Family Medicine

## 2024-05-08 NOTE — Assessment & Plan Note (Signed)
 A1c improved from 9.3 to 8.9. Most likely due to trulicity .   A1c improved from 9.3 to 8.9. Most likely due to trulicity . She says that she had an A1c from her nurse which was 8.1. She had lost some weight too but now went back up to normal since she stopped the trulicity .  - Stop glipizide , restart trulicity  at 0.75 - follow up in a month to see if we can increase the dose

## 2024-05-08 NOTE — Progress Notes (Signed)
    SUBJECTIVE:   CHIEF COMPLAINT / HPI:   T2DM  Patient was started on trulicity  0.75 at last visit on 02/07/2024. She only had 4 shots and then ran out. She had been on it and had side effects of low blood sugars in the 70s occasionally. She did not get a refill thus, did not continue. Also was worried about low sugars. She is also on synjardy  and glipizide . She has been checking fasting blood sugars daily.  She is not interested in a CGM at this time.  Patient has also been participating in more walking and water aerobics.  She did say that she had less appetite while on Trulicity  however was not nauseous and did not have other GI side effects.  Restless leg  Patient continues to have frustrating symptoms of restless leg syndrome and waking up long like she has to walk around.  Gabapentin  has been helping her she sometimes has to take 2 pills at night.  Does not feel like her symptoms are tingling or burning type pain but more cramping.  She says she has had a history of low iron in the past.  PERTINENT  PMH / PSH: T2DM, HTN, Glaucoma,   OBJECTIVE:   BP 116/78   Pulse 64   Ht 5' 1 (1.549 m)   Wt 154 lb 12.8 oz (70.2 kg)   SpO2 98%   BMI 29.25 kg/m   General: well appearing, in no acute distress CV: RRR, radial pulses equal and palpable, no BLE edema  Resp: Normal work of breathing on room air, CTAB Diabetic Foot Exam - Simple   Simple Foot Form Visual Inspection See comments: Yes Sensation Testing Intact to touch and monofilament testing bilaterally: Yes Pulse Check See comments: Yes Comments Hallux valgus bilaterally with overlapping of the 2nd and 3rd toes on right foot.  No ulcers or lesions visible. Foot appropriate temperature however dorsalis and posterior tibialis pulses slightly decreased bilaterally consistent with previous exam     Neuro: Alert & Oriented x 4      ASSESSMENT/PLAN:   Assessment & Plan Type 2 diabetes mellitus with hyperglycemia, without  long-term current use of insulin  (HCC) Type 2 diabetes mellitus with hyperglycemia, unspecified whether long term insulin  use (HCC) A1c improved from 9.3 to 8.9. Most likely due to trulicity .   A1c improved from 9.3 to 8.9. Most likely due to trulicity . She says that she had an A1c from her nurse which was 8.1. She had lost some weight too but now went back up to normal since she stopped the trulicity .  - Stop glipizide , restart trulicity  at 0.75 - follow up in a month to see if we can increase the dose  Restless leg syndrome Continues to have symptoms though gabapentin  does help some.  Does not have anemia on most recent labs however has not had iron checked recently. - Iron, TIBC, ferritin -continue gabapentin    Follow-up in 1 month  Areta Saliva, MD [facility]

## 2024-05-08 NOTE — Assessment & Plan Note (Addendum)
 A1c improved from 9.3 to 8.9. Most likely due to trulicity .   A1c improved from 9.3 to 8.9. Most likely due to trulicity . She says that she had an A1c from her nurse which was 8.1. She had lost some weight too but now went back up to normal since she stopped the trulicity .  - Stop glipizide , restart trulicity  at 0.75 - follow up in a month to see if we can increase the dose

## 2024-05-09 LAB — IRON AND TIBC
Iron Saturation: 19 % (ref 15–55)
Iron: 71 ug/dL (ref 27–139)
Total Iron Binding Capacity: 368 ug/dL (ref 250–450)
UIBC: 297 ug/dL (ref 118–369)

## 2024-05-09 LAB — FERRITIN: Ferritin: 75 ng/mL (ref 15–150)

## 2024-05-11 ENCOUNTER — Ambulatory Visit: Payer: Self-pay | Admitting: Family Medicine

## 2024-05-13 ENCOUNTER — Telehealth: Payer: Self-pay | Admitting: Pharmacist

## 2024-05-13 NOTE — Telephone Encounter (Signed)
 Attempted to contact patient for possible enrollment in LIBERATE CGM study.   Left HIPAA compliant voice mail requesting call back to direct phone: [phone]  Total time with patient call and documentation of interaction: 4 minutes.

## 2024-05-13 NOTE — Telephone Encounter (Signed)
-----   Message from Maude Lagos sent at 04/27/2024  2:09 PM EDT ----- Regarding: CGM - Liberate Phone or interest in CGM - LIberate

## 2024-05-25 NOTE — Progress Notes (Signed)
 Megan Khan                                          MRN: [id number]   05/25/2024   The [facility] Quality Team Specialist reviewed this patient medical record for the purposes of chart review for care gap closure. The following were reviewed: chart review for care gap closure-glycemic status assessment.    [facility] Quality Team

## 2024-05-27 DIAGNOSIS — H04123 Dry eye syndrome of bilateral lacrimal glands: Secondary | ICD-10-CM | POA: Diagnosis not present

## 2024-05-27 DIAGNOSIS — H401133 Primary open-angle glaucoma, bilateral, severe stage: Secondary | ICD-10-CM | POA: Diagnosis not present

## 2024-05-27 DIAGNOSIS — M316 Other giant cell arteritis: Secondary | ICD-10-CM | POA: Diagnosis not present

## 2024-05-27 DIAGNOSIS — H541224 Low vision right eye category 2, blindness left eye category 4: Secondary | ICD-10-CM | POA: Diagnosis not present

## 2024-06-04 ENCOUNTER — Other Ambulatory Visit: Payer: Self-pay | Admitting: Family Medicine

## 2024-06-04 DIAGNOSIS — I10 Essential (primary) hypertension: Secondary | ICD-10-CM

## 2024-06-08 ENCOUNTER — Ambulatory Visit
Admission: RE | Admit: 2024-06-08 | Discharge: 2024-06-08 | Disposition: A | Source: Ambulatory Visit | Attending: Family Medicine | Admitting: Family Medicine

## 2024-06-08 DIAGNOSIS — Z853 Personal history of malignant neoplasm of breast: Secondary | ICD-10-CM

## 2024-06-08 DIAGNOSIS — R928 Other abnormal and inconclusive findings on diagnostic imaging of breast: Secondary | ICD-10-CM | POA: Diagnosis not present

## 2024-06-11 ENCOUNTER — Ambulatory Visit: Payer: Self-pay | Admitting: Family Medicine

## 2024-06-15 ENCOUNTER — Ambulatory Visit: Admitting: Family Medicine

## 2024-06-15 ENCOUNTER — Encounter: Payer: Self-pay | Admitting: Family Medicine

## 2024-06-15 VITALS — BP 122/80 | HR 72 | Wt 155.2 lb

## 2024-06-15 DIAGNOSIS — E1165 Type 2 diabetes mellitus with hyperglycemia: Secondary | ICD-10-CM | POA: Diagnosis not present

## 2024-06-15 DIAGNOSIS — I10 Essential (primary) hypertension: Secondary | ICD-10-CM

## 2024-06-15 DIAGNOSIS — G2581 Restless legs syndrome: Secondary | ICD-10-CM | POA: Diagnosis not present

## 2024-06-15 MED ORDER — GABAPENTIN 300 MG PO CAPS
300.0000 mg | ORAL_CAPSULE | Freq: Three times a day (TID) | ORAL | 1 refills | Status: AC
Start: 2024-06-15 — End: ?

## 2024-06-15 MED ORDER — AMLODIPINE BESYLATE 10 MG PO TABS: ORAL_TABLET | ORAL | 2 refills | Status: AC

## 2024-06-15 MED ORDER — METOPROLOL SUCCINATE ER 50 MG PO TB24: 50.0000 mg | ORAL_TABLET | Freq: Every day | ORAL | 1 refills | Status: AC

## 2024-06-15 MED ORDER — LISINOPRIL 40 MG PO TABS: 40.0000 mg | ORAL_TABLET | Freq: Every day | ORAL | 0 refills | Status: AC

## 2024-06-15 MED ORDER — TRULICITY 0.75 MG/0.5ML ~~LOC~~ SOAJ: 0.7500 mg | SUBCUTANEOUS | 1 refills | Status: DC

## 2024-06-15 NOTE — Patient Instructions (Addendum)
 It was wonderful to see you today.  Please bring ALL of your medications with you to every visit.   Today we talked about:  Diabetes - your sugars look really good! I am glad you're not having any lows. We will refill your trulicity . I am going to check cholesterol today. Your foot exam was good.   I refilled your blood pressure medicines as well.   Please follow up in 2 months   Thank you for choosing [facility].   Please call [phone] with any questions about today's appointment.  Please be sure to schedule follow up at the front desk before you leave today.   Areta Saliva, MD  Family Medicine

## 2024-06-15 NOTE — Progress Notes (Signed)
   SUBJECTIVE:   CHIEF COMPLAINT / HPI:  Discussed the use of AI scribe software for clinical note transcription with the patient, who gave verbal consent to proceed.  History of Present Illness Megan Khan is a 68 year old female with type two diabetes who presents for follow-up.  Glycemic control and hypoglycemia - Type 2 diabetes mellitus with previous A1c improvement to 8.9%. - Experienced hypoglycemic episodes while on glipizide , leading to discontinuation of glipizide . - Initiated Trulicity  0.75 mg weekly with abdominal site rotation; no significant hypoglycemic episodes since transition. - Current blood glucose levels range from 100 to 120 mg/dL. - No nausea or vomiting with Trulicity . - Experiences nocturnal hunger and consumes candy at night. - No significant weight loss.  Restless legs and nocturnal leg cramping - Restless leg syndrome managed with gabapentin  and wearing socks to bed. - Increased leg cramping occurred when bed covers were removed by her daughter.  Antihypertensive and neuropathic medication management - Takes amlodipine , lisinopril , metoprolol , and gabapentin . - Received 60 tablets of amlodipine  instead of 90; running low on lisinopril , metoprolol , and gabapentin .  Constitutional and general symptoms - No recent illness, weakness, lightheadedness, nausea, or vomiting.    PERTINENT  PMH / PSH: T2DM,   OBJECTIVE:  BP 122/80   Pulse 72   Wt 155 lb 3.2 oz (70.4 kg)   SpO2 97%   BMI 29.32 kg/m   Physical Exam BREAST: Breasts normal  General: well appearing, in no acute distress CV: RRR, radial pulses equal and palpable, no BLE edema  Resp: Normal work of breathing on room air, CTAB Abd: Soft, non tender, non distended  Neuro: Alert & Oriented x 4   ASSESSMENT/PLAN:   Assessment & Plan Type 2 diabetes mellitus with hyperglycemia, without long-term current use of insulin  (HCC)  Type 2 diabetes mellitus with hyperglycemia, unspecified  whether long term insulin  use (HCC)  HYPERTENSION, BENIGN SYSTEMIC  Restless leg syndrome   Assessment and Plan Assessment & Plan Type 2 diabetes mellitus with hyperglycemia Improved glycemic control. Resolved hypoglycemic episodes after stopping glipizide . Trulicity  well-tolerated. Blood glucose 100-120 mg/dL. Declined Liberate study. - Continue Trulicity  0.75 mg subcutaneous weekly. - Refill Trulicity  prescription with a 90-day supply. - Encourage dietary adjustments to include more protein or fat at dinner. - Perform foot exam to assess sensation and circulation. - Plan follow-up in two months to recheck A1c.  Essential hypertension Managed with amlodipine , lisinopril , and metoprolol . Running low on medications. - Refill amlodipine . - Refill lisinopril  and metoprolol  prescriptions.  Restless legs syndrome Improved symptoms with gabapentin  and wearing socks. Cramping when covers removed. - Continue gabapentin  as needed. - Encourage wearing socks to keep feet warm during sleep.  General Health Maintenance Normal breast exam with dense tissue. No immediate concerns. Encouraged physical activity. - Encourage resistance exercises such as weights, squats, and pushups. - Plan annual breast exam in one year.     Areta Saliva, MD [facility]

## 2024-06-16 ENCOUNTER — Ambulatory Visit: Payer: Self-pay | Admitting: Family Medicine

## 2024-06-16 LAB — LIPID PANEL
Chol/HDL Ratio: 2.8 ratio (ref 0.0–4.4)
Cholesterol, Total: 142 mg/dL (ref 100–199)
HDL: 50 mg/dL (ref >39)
LDL Chol Calc (NIH): 67 mg/dL (ref 0–99)
Triglycerides: 146 mg/dL (ref 0–149)
VLDL Cholesterol Cal: 25 mg/dL (ref 5–40)

## 2024-06-16 NOTE — Assessment & Plan Note (Signed)
 Type 2 diabetes mellitus with hyperglycemia Improved glycemic control. Resolved hypoglycemic episodes after stopping glipizide . Trulicity  well-tolerated. Blood glucose 100-120 mg/dL. Declined Liberate study.  Patient has additional motivation and factors of being able to get her bunion and hammertoe surgery once she has controlled A1c - Continue Trulicity  0.75 mg subcutaneous weekly. - Refill Trulicity  prescription with a 90-day supply. - Encourage dietary adjustments to include more protein or fat at dinner. - Plan follow-up in two months to recheck A1c.

## 2024-06-20 ENCOUNTER — Other Ambulatory Visit: Payer: Self-pay | Admitting: Family Medicine

## 2024-06-20 DIAGNOSIS — I10 Essential (primary) hypertension: Secondary | ICD-10-CM

## 2024-06-20 DIAGNOSIS — E1165 Type 2 diabetes mellitus with hyperglycemia: Secondary | ICD-10-CM

## 2024-07-17 ENCOUNTER — Telehealth: Payer: Self-pay

## 2024-07-17 NOTE — Telephone Encounter (Signed)
 Patient was identified as falling into the [place] Measure - Diabetes.   Patient was: Left voicemail to schedule with primary care provider.  They will need an appt for lab for A1C prior to year end.

## 2024-07-24 ENCOUNTER — Ambulatory Visit

## 2024-07-24 ENCOUNTER — Ambulatory Visit: Payer: Self-pay | Admitting: Family Medicine

## 2024-07-24 VITALS — Wt 156.4 lb

## 2024-07-24 DIAGNOSIS — E1165 Type 2 diabetes mellitus with hyperglycemia: Secondary | ICD-10-CM

## 2024-07-24 LAB — POCT GLYCOSYLATED HEMOGLOBIN (HGB A1C): HbA1c, POC (controlled diabetic range): 8.4 % — AB (ref 0.0–7.0)

## 2024-07-24 NOTE — Telephone Encounter (Signed)
 Called patient with news of slightly improved A1c. I gave her the option of increasing trulicity  or keeping it the same to see if it would improve further. Patient wanted to keep same dose and check A1c again in 3 months.

## 2024-07-25 ENCOUNTER — Telehealth: Payer: Self-pay

## 2024-07-25 NOTE — Telephone Encounter (Signed)
 Patient was identified as falling into the [place] Measure - Diabetes.   Patient was: Appointment scheduled for lab or office visit for A1c.

## 2024-07-27 NOTE — Progress Notes (Signed)
 Patient was switched to lab visit.

## 2024-08-21 ENCOUNTER — Ambulatory Visit: Admitting: Family Medicine

## 2024-08-25 ENCOUNTER — Ambulatory Visit: Admitting: Family Medicine

## 2024-08-25 ENCOUNTER — Encounter: Payer: Self-pay | Admitting: Family Medicine

## 2024-08-25 VITALS — BP 110/63 | HR 61 | Ht 61.0 in | Wt 154.2 lb

## 2024-08-25 DIAGNOSIS — E1165 Type 2 diabetes mellitus with hyperglycemia: Secondary | ICD-10-CM | POA: Diagnosis not present

## 2024-08-25 DIAGNOSIS — I10 Essential (primary) hypertension: Secondary | ICD-10-CM

## 2024-08-25 MED ORDER — TRULICITY 0.75 MG/0.5ML ~~LOC~~ SOAJ: 1.5000 mg | SUBCUTANEOUS | 1 refills | Status: AC

## 2024-08-25 NOTE — Patient Instructions (Addendum)
 It was wonderful to see you today.  Please bring ALL of your medications with you to every visit.    VISIT SUMMARY: Megan Khan, a 69 year old female with diabetes, came in for a follow-up on her A1c levels. Her A1c has improved to 8.4% from 8.9%, and her fasting blood glucose levels are around 120 mg/dL. She is also managing hypertension, neuropathic pain, and hyperlipidemia with her current medications. Additionally, she recently had an eye exam and is two years post-lumpectomy for breast cancer, with her last breast exam in October showing no masses.  YOUR PLAN: -TYPE 2 DIABETES MELLITUS: Your A1c has improved to 8.4%, and your fasting blood sugars are slightly elevated but decreasing. We have increased your Trulicity  dosage to the next level. Please continue taking your medications as prescribed, and we will reassess your A1c in 2-3 months. Type 2 diabetes is a condition where your body does not use insulin  properly, leading to high blood sugar levels.  -HYPERTENSION: Your blood pressure is well controlled with your current medications. Please continue taking amlodipine , lisinopril , metoprolol , and spironolactone  as prescribed. Hypertension is high blood pressure, which can lead to serious health issues if not managed properly.  -HISTORY OF BREAST CANCER: You are two years post-lumpectomy, and your recent breast exam showed no masses. We will schedule your next breast exam in October. Breast cancer is a type of cancer that forms in the cells of the breasts.  -GENERAL HEALTH MAINTENANCE: Your routine health maintenance is pending upcoming appointments, and your intraocular pressure is normal. Please continue with your routine health maintenance and screenings. General health maintenance includes regular check-ups, screenings, and tests to ensure overall health and early detection of any issues.  INSTRUCTIONS: Please follow up in 2-3 months to reassess your A1c levels. Continue taking  your medications as prescribed and maintain your routine health maintenance and screenings. Your next breast exam is scheduled for October.  Contains text generated by [employer].   Thank you for choosing [facility].   Please call [phone] with any questions about today's appointment.  Please be sure to schedule follow up at the front desk before you leave today.   Areta Saliva, MD  Family Medicine

## 2024-08-25 NOTE — Progress Notes (Unsigned)
" ° °  SUBJECTIVE:   CHIEF COMPLAINT / HPI:  Discussed the use of AI scribe software for clinical note transcription with the patient, who gave verbal consent to proceed.  History of Present Illness Megan Khan is a 69 year old female with diabetes who presents for follow-up of her A1c levels.  Glycemic control - Last hemoglobin A1c was 8.4%one month ago, improved from previous value of 8.9%. - Uses Trulicity  0.72 weekly and Synjardy  for diabetes management. - Fasting blood glucose levels are approximately 120 mg/dL on average.  Antihypertensive and cardiovascular medication use - Takes amlodipine , lisinopril , metoprolol , and spironolactone  for blood pressure and cardiovascular management.  Neuropathic pain management - Uses gabapentin  for neuropathic pain.  Hyperlipidemia management - Takes lovastatin  for cholesterol control.  Ophthalmologic evaluation - Completed an eye exam last week. Has not had records sent over yet. Pressure was normal during this visit.   Breast cancer surveillance - Two years status post lumpectomy. - Mammogram performed in October. Benign.  - Annual follow-up recommended    PERTINENT  PMH / PSH: HTN, T2DM   OBJECTIVE:  BP 110/63   Pulse 61   Ht 5' 1 (1.549 m)   Wt 154 lb 4 oz (70 kg)   SpO2 96%   BMI 29.15 kg/m   General: well appearing, in no acute distress CV: RRR, radial pulses equal and palpable, no BLE edema  Resp: Normal work of breathing on room air, CTAB Abd: Soft, non tender, non distended  Neuro: Alert & Oriented   ASSESSMENT/PLAN:   Assessment & Plan Type 2 diabetes mellitus with hyperglycemia, unspecified whether long term insulin  use (HCC) A1c improved to 8.4. Fasting blood sugars slightly elevated but decreasing. Weight stable. Has not decreased in the last year or so. Patient motivated for lower A1c to get bunion surgery.  - Increased Trulicity  dosage to next level. 2 mg weekly  - Scheduled follow-up in 2-3 months  to reassess A1c. HYPERTENSION, BENIGN SYSTEMIC Hypertension Well controlled with current medications. - Continue current antihypertensive medications.  Areta Saliva, MD [facility] "

## 2024-08-26 NOTE — Assessment & Plan Note (Signed)
 Hypertension Well controlled with current medications. - Continue current antihypertensive medications.

## 2024-08-26 NOTE — Assessment & Plan Note (Signed)
 A1c improved to 8.4. Fasting blood sugars slightly elevated but decreasing. Weight stable. Has not decreased in the last year or so. Patient motivated for lower A1c to get bunion surgery.  - Increased Trulicity  dosage to next level. 2 mg weekly  - Scheduled follow-up in 2-3 months to reassess A1c.

## 2025-04-29 ENCOUNTER — Encounter
